# Patient Record
Sex: Male | Born: 1955 | Race: White | Hispanic: No | Smoking: Current every day smoker
Health system: Southern US, Community
[De-identification: ages and names within clinical notes are randomized; demographics above are authoritative.]

## PROBLEM LIST (undated history)

## (undated) DIAGNOSIS — R109 Unspecified abdominal pain: Principal | ICD-10-CM

## (undated) DIAGNOSIS — T8859XA Other complications of anesthesia, initial encounter: Secondary | ICD-10-CM

## (undated) DIAGNOSIS — Z720 Tobacco use: Secondary | ICD-10-CM

## (undated) DIAGNOSIS — T7501XA Shock due to being struck by lightning, initial encounter: Secondary | ICD-10-CM

## (undated) DIAGNOSIS — E669 Obesity, unspecified: Secondary | ICD-10-CM

## (undated) DIAGNOSIS — G43909 Migraine, unspecified, not intractable, without status migrainosus: Secondary | ICD-10-CM

## (undated) DIAGNOSIS — Z8719 Personal history of other diseases of the digestive system: Secondary | ICD-10-CM

## (undated) DIAGNOSIS — F32A Depression, unspecified: Secondary | ICD-10-CM

## (undated) DIAGNOSIS — G8929 Other chronic pain: Secondary | ICD-10-CM

## (undated) DIAGNOSIS — M1990 Unspecified osteoarthritis, unspecified site: Secondary | ICD-10-CM

## (undated) DIAGNOSIS — K3184 Gastroparesis: Secondary | ICD-10-CM

## (undated) DIAGNOSIS — K5790 Diverticulosis of intestine, part unspecified, without perforation or abscess without bleeding: Secondary | ICD-10-CM

## (undated) DIAGNOSIS — K219 Gastro-esophageal reflux disease without esophagitis: Secondary | ICD-10-CM

## (undated) DIAGNOSIS — Z9889 Other specified postprocedural states: Secondary | ICD-10-CM

## (undated) DIAGNOSIS — R011 Cardiac murmur, unspecified: Secondary | ICD-10-CM

## (undated) DIAGNOSIS — Z8619 Personal history of other infectious and parasitic diseases: Secondary | ICD-10-CM

## (undated) DIAGNOSIS — F329 Major depressive disorder, single episode, unspecified: Secondary | ICD-10-CM

## (undated) DIAGNOSIS — G4733 Obstructive sleep apnea (adult) (pediatric): Secondary | ICD-10-CM

## (undated) DIAGNOSIS — K529 Noninfective gastroenteritis and colitis, unspecified: Secondary | ICD-10-CM

## (undated) DIAGNOSIS — E119 Type 2 diabetes mellitus without complications: Secondary | ICD-10-CM

## (undated) DIAGNOSIS — A048 Other specified bacterial intestinal infections: Secondary | ICD-10-CM

## (undated) DIAGNOSIS — M67442 Ganglion, left hand: Secondary | ICD-10-CM

## (undated) DIAGNOSIS — I639 Cerebral infarction, unspecified: Secondary | ICD-10-CM

## (undated) DIAGNOSIS — E785 Hyperlipidemia, unspecified: Secondary | ICD-10-CM

## (undated) DIAGNOSIS — K589 Irritable bowel syndrome without diarrhea: Secondary | ICD-10-CM

## (undated) DIAGNOSIS — T7500XA Unspecified effects of lightning, initial encounter: Secondary | ICD-10-CM

## (undated) DIAGNOSIS — F419 Anxiety disorder, unspecified: Secondary | ICD-10-CM

## (undated) DIAGNOSIS — G40909 Epilepsy, unspecified, not intractable, without status epilepticus: Secondary | ICD-10-CM

## (undated) DIAGNOSIS — I10 Essential (primary) hypertension: Secondary | ICD-10-CM

## (undated) DIAGNOSIS — K760 Fatty (change of) liver, not elsewhere classified: Secondary | ICD-10-CM

## (undated) DIAGNOSIS — M792 Neuralgia and neuritis, unspecified: Secondary | ICD-10-CM

## (undated) DIAGNOSIS — D126 Benign neoplasm of colon, unspecified: Secondary | ICD-10-CM

## (undated) DIAGNOSIS — R55 Syncope and collapse: Secondary | ICD-10-CM

## (undated) DIAGNOSIS — G4730 Sleep apnea, unspecified: Secondary | ICD-10-CM

## (undated) HISTORY — DX: Ganglion, left hand: M67.442

## (undated) HISTORY — PX: LARYNX SURGERY: SHX692

## (undated) HISTORY — DX: Shock due to being struck by lightning, initial encounter: T75.01XA

## (undated) HISTORY — DX: Personal history of other infectious and parasitic diseases: Z86.19

## (undated) HISTORY — DX: Benign neoplasm of colon, unspecified: D12.6

## (undated) HISTORY — DX: Obstructive sleep apnea (adult) (pediatric): G47.33

## (undated) HISTORY — DX: Gastro-esophageal reflux disease without esophagitis: K21.9

## (undated) HISTORY — DX: Tobacco use: Z72.0

## (undated) HISTORY — PX: CATARACT EXTRACTION: SUR2

## (undated) HISTORY — DX: Irritable bowel syndrome, unspecified: K58.9

## (undated) HISTORY — DX: Other specified bacterial intestinal infections: A04.8

## (undated) HISTORY — PX: TONSILLECTOMY: SUR1361

## (undated) HISTORY — DX: Unspecified abdominal pain: R10.9

## (undated) HISTORY — DX: Other chronic pain: G89.29

## (undated) HISTORY — DX: Gastroparesis: K31.84

## (undated) HISTORY — PX: VENA CAVA FILTER PLACEMENT: SHX1085

## (undated) HISTORY — PX: OTHER SURGICAL HISTORY: SHX169

---

## 1998-03-27 ENCOUNTER — Ambulatory Visit (HOSPITAL_COMMUNITY): Admission: RE | Admit: 1998-03-27 | Discharge: 1998-03-27 | Payer: Self-pay | Admitting: Neurology

## 2000-08-31 DIAGNOSIS — T7500XA Unspecified effects of lightning, initial encounter: Secondary | ICD-10-CM

## 2000-08-31 HISTORY — DX: Unspecified effects of lightning, initial encounter: T75.00XA

## 2001-08-31 DIAGNOSIS — A048 Other specified bacterial intestinal infections: Secondary | ICD-10-CM

## 2001-08-31 HISTORY — DX: Other specified bacterial intestinal infections: A04.8

## 2004-08-31 HISTORY — PX: OTHER SURGICAL HISTORY: SHX169

## 2006-02-03 HISTORY — PX: ESOPHAGOGASTRODUODENOSCOPY: SHX1529

## 2010-03-13 ENCOUNTER — Emergency Department (HOSPITAL_COMMUNITY): Admission: EM | Admit: 2010-03-13 | Discharge: 2010-03-13 | Payer: Self-pay | Admitting: Emergency Medicine

## 2010-08-31 HISTORY — PX: COLONOSCOPY: SHX174

## 2010-09-02 HISTORY — PX: ESOPHAGOGASTRODUODENOSCOPY: SHX1529

## 2010-11-16 LAB — CBC
HCT: 39.4 % (ref 39.0–52.0)
Hemoglobin: 13.3 g/dL (ref 13.0–17.0)
MCH: 31.8 pg (ref 26.0–34.0)
MCHC: 33.6 g/dL (ref 30.0–36.0)
MCV: 94.5 fL (ref 78.0–100.0)
Platelets: 227 10*3/uL (ref 150–400)
RBC: 4.17 MIL/uL — ABNORMAL LOW (ref 4.22–5.81)
RDW: 13.4 % (ref 11.5–15.5)
WBC: 10.5 10*3/uL (ref 4.0–10.5)

## 2010-11-16 LAB — DIFFERENTIAL
Basophils Absolute: 0 10*3/uL (ref 0.0–0.1)
Basophils Relative: 0 % (ref 0–1)
Eosinophils Absolute: 0.2 10*3/uL (ref 0.0–0.7)
Eosinophils Relative: 2 % (ref 0–5)
Lymphocytes Relative: 25 % (ref 12–46)
Lymphs Abs: 2.6 10*3/uL (ref 0.7–4.0)
Monocytes Absolute: 0.8 10*3/uL (ref 0.1–1.0)
Monocytes Relative: 8 % (ref 3–12)
Neutro Abs: 6.9 10*3/uL (ref 1.7–7.7)
Neutrophils Relative %: 66 % (ref 43–77)

## 2010-11-16 LAB — COMPREHENSIVE METABOLIC PANEL
ALT: 22 U/L (ref 0–53)
AST: 17 U/L (ref 0–37)
Albumin: 4.1 g/dL (ref 3.5–5.2)
Alkaline Phosphatase: 62 U/L (ref 39–117)
BUN: 18 mg/dL (ref 6–23)
CO2: 25 mEq/L (ref 19–32)
Calcium: 9.2 mg/dL (ref 8.4–10.5)
Chloride: 111 mEq/L (ref 96–112)
Creatinine, Ser: 1.09 mg/dL (ref 0.4–1.5)
GFR calc Af Amer: >60 mL/min (ref >60)
GFR calc non Af Amer: >60 mL/min (ref >60)
Glucose, Bld: 88 mg/dL (ref 70–99)
Potassium: 4.3 mEq/L (ref 3.5–5.1)
Sodium: 142 mEq/L (ref 135–145)
Total Bilirubin: 0.4 mg/dL (ref 0.3–1.2)
Total Protein: 6.8 g/dL (ref 6.0–8.3)

## 2010-11-16 LAB — LIPASE, BLOOD: Lipase: 19 U/L (ref 11–59)

## 2011-08-11 DIAGNOSIS — G8929 Other chronic pain: Secondary | ICD-10-CM

## 2011-08-11 HISTORY — DX: Other chronic pain: G89.29

## 2011-10-28 ENCOUNTER — Encounter: Payer: Self-pay | Admitting: Urgent Care

## 2011-10-28 ENCOUNTER — Ambulatory Visit (INDEPENDENT_AMBULATORY_CARE_PROVIDER_SITE_OTHER): Payer: Medicaid Other | Admitting: Urgent Care

## 2011-10-28 VITALS — BP 142/82 | HR 97 | Temp 98.6°F | Ht 72.0 in | Wt 223.8 lb

## 2011-10-28 DIAGNOSIS — K760 Fatty (change of) liver, not elsewhere classified: Secondary | ICD-10-CM

## 2011-10-28 DIAGNOSIS — K824 Cholesterolosis of gallbladder: Secondary | ICD-10-CM

## 2011-10-28 DIAGNOSIS — R109 Unspecified abdominal pain: Secondary | ICD-10-CM | POA: Insufficient documentation

## 2011-10-28 DIAGNOSIS — K7689 Other specified diseases of liver: Secondary | ICD-10-CM

## 2011-10-28 DIAGNOSIS — G8929 Other chronic pain: Secondary | ICD-10-CM

## 2011-10-28 HISTORY — DX: Cholesterolosis of gallbladder: K82.4

## 2011-10-28 LAB — HEPATIC FUNCTION PANEL
ALT: 34 U/L (ref 0–53)
AST: 22 U/L (ref 0–37)
Albumin: 4.2 g/dL (ref 3.5–5.2)
Alkaline Phosphatase: 60 U/L (ref 39–117)
Bilirubin, Direct: 0.1 mg/dL (ref 0.0–0.3)
Indirect Bilirubin: 0.1 mg/dL (ref 0.0–0.9)
Total Bilirubin: 0.2 mg/dL — ABNORMAL LOW (ref 0.3–1.2)
Total Protein: 6.7 g/dL (ref 6.0–8.3)

## 2011-10-28 LAB — CBC WITH DIFFERENTIAL/PLATELET
Basophils Absolute: 0 10*3/uL (ref 0.0–0.1)
Basophils Relative: 0 % (ref 0–1)
Eosinophils Absolute: 0.3 10*3/uL (ref 0.0–0.7)
Eosinophils Relative: 4 % (ref 0–5)
HCT: 41.7 % (ref 39.0–52.0)
Hemoglobin: 13.9 g/dL (ref 13.0–17.0)
Lymphocytes Relative: 23 % (ref 12–46)
Lymphs Abs: 1.9 10*3/uL (ref 0.7–4.0)
MCH: 32.1 pg (ref 26.0–34.0)
MCHC: 33.3 g/dL (ref 30.0–36.0)
MCV: 96.3 fL (ref 78.0–100.0)
Monocytes Absolute: 0.9 10*3/uL (ref 0.1–1.0)
Monocytes Relative: 11 % (ref 3–12)
Neutro Abs: 5.2 10*3/uL (ref 1.7–7.7)
Neutrophils Relative %: 62 % (ref 43–77)
Platelets: 201 10*3/uL (ref 150–400)
RBC: 4.33 MIL/uL (ref 4.22–5.81)
RDW: 13.8 % (ref 11.5–15.5)
WBC: 8.4 10*3/uL (ref 4.0–10.5)

## 2011-10-28 LAB — LIPASE: Lipase: 25 U/L (ref 0–75)

## 2011-10-28 LAB — AMYLASE: Amylase: 49 U/L (ref 0–105)

## 2011-10-28 NOTE — Progress Notes (Signed)
Faxed to PCP

## 2011-10-28 NOTE — Assessment & Plan Note (Addendum)
Brian [name] has chronic abdominal pain, irritable bowel syndrome and GERD diagnosed years ago in [place]. He was previously followed by GI there. I have requested last colonoscopy & EGD reports with pathology.  He also has 2 small left renal lesions which are indeterminate.  He will need followup with his PCP for further recommendations regarding these lesions. CBC, LFTs, amylase, lipase

## 2011-10-28 NOTE — Patient Instructions (Signed)
Go get your lab work this week.  We will call you with results. We request your colonoscopy and EGD reports with any biopsies from [place]. I will call you once lab work is reviewed to talk about referral to a surgeon to have her gallbladder taken out. 1-800-quit-now for help with quitting smoking

## 2011-10-28 NOTE — Progress Notes (Signed)
Referring Provider: Denita Lung, DO Primary Care Physician:  Denita Lung, DO, DO Primary Gastroenterologist:  Dr. Jena Gauss  Chief Complaint  Patient presents with  . Abdominal Pain    fatty liver  . Nausea    HPI:  Brian [name] is a 56 y.o. male here as a referral from Dr. Fran Lowes for her fatty liver and multiple gallbladder polyps. Mr. Bais recently moved from [place] November 2012. She gives extensive GI history of chronic abdominal pain, irritable bowel syndrome, and GERD.  He has had constant LLQ pain since 2006. He was followed by Dr. Steele Berg right in [place], [place]. He has had a colonoscopy and EGD within the last year or so. He gives history of adenomatous polyps and says he is due for repeat colonoscopy in 3 years. He is on Elavil and multiple chronic narcotics as well as Phenergan PPI for his chronic abdominal pain/nausea. He was seen by his primary care physician for nausea and abdominal pain. An abdominal ultrasound was ordered which showed hepatomegaly with liver measuring 19.1 cm and fatty infiltration of the liver. There were also multiple gallbladder wall polyps up to 11 mm. No stones noted or biliary dilation.  He did have 2 small left renal indeterminate lesions.     He denies any history of liver disease. His main complaint is that of left-sided abdominal pain which she rates 10/10,  lasts hours, doubles him over, describes as constant/knifelike.  Denies vomiting, but nausea with pain.  Wt stable.  Pain worse w/ eating.  Worse with movement.  He also describes diarrhea 75% of the time with at least 3 loose stools per day. Denies any rectal bleeding, melena, clay colored stools or mucus. Denies fever.  He is acid reflux is well controlled at this time. He tells me his previous EGD was normal.  Past Medical History  Diagnosis Date  . DM (diabetes mellitus)   . HTN (hypertension)   . IBS (irritable bowel syndrome)   . GERD (gastroesophageal reflux disease)   . High cholesterol     . Chronic abdominal pain 08/11/11    Reported per patient, Dr. Ralene Cork in [place]  . Adenomatous colon polyp     Past Surgical History  Procedure Date  . Tonsillectomy   . Arm surgery     tendon/left    Current Outpatient Prescriptions  Medication Sig Dispense Refill  . ACETAMINOPHEN-BUTALBITAL (BUPAP) 50-650 MG TABS Take by mouth every 6 (six) hours as needed.      Marland Kitchen amitriptyline (ELAVIL) 100 MG tablet Take 100 mg by mouth at bedtime.      Marland Kitchen amLODipine-valsartan (EXFORGE) 5-160 MG per tablet Take 1 tablet by mouth 2 (two) times daily.      Marland Kitchen BIOTIN PO Take 10,000 mg by mouth daily.      . clidinium-chlordiazePOXIDE (LIBRAX) 2.5-5 MG per capsule Take 1 capsule by mouth 3 (three) times daily as needed.      . DULoxetine (CYMBALTA) 60 MG capsule Take 60 mg by mouth daily.      Marland Kitchen glimepiride (AMARYL) 4 MG tablet Take 4 mg by mouth daily before breakfast.      . HYDROcodone-acetaminophen (LORTAB) 10-500 MG per tablet Take 1 tablet by mouth every 6 (six) hours as needed.      . metFORMIN (GLUCOPHAGE) 500 MG tablet Take 500 mg by mouth daily with breakfast.      . metoCLOPramide (REGLAN) 10 MG tablet Take 10 mg by mouth 4 (four) times daily.      Marland Kitchen  MILK THISTLE PO Take 1,000 mg by mouth daily.      . Multiple Vitamin (MULTIVITAMIN) capsule Take 1 capsule by mouth daily.      . Niacin-Simvastatin [facility]) 500-40 MG TB24 Take by mouth daily.      . pantoprazole (PROTONIX) 40 MG tablet Take 40 mg by mouth daily.      . promethazine (PHENERGAN) 25 MG tablet Take 25 mg by mouth every 6 (six) hours as needed.      . sucralfate (CARAFATE) 1 G tablet Take 1 g by mouth 4 (four) times daily.      Marland Kitchen topiramate (TOPAMAX) 100 MG tablet Take 100 mg by mouth 2 (two) times daily.        Allergies as of 10/28/2011 - Review Complete 10/28/2011  Allergen Reaction Noted  . Ketorolac tromethamine  10/27/2011  . Ciprofloxacin Rash 10/27/2011  . Metronidazole Rash 10/27/2011  . Sulfamethoxazole Rash  10/27/2011  . Tetracyclines & related Rash 10/27/2011    Family History  Problem Relation Age of Onset  . Cancer Father 9    Gallbladder    History   Social History  . Marital Status: Married    Spouse Name: N/A    Number of Children: 1  . Years of Education: N/A   Occupational History  . disabled    Social History Main Topics  . Smoking status: Current Everyday Smoker -- 1.0 packs/day for 42 years    Types: Cigarettes  . Smokeless tobacco: Not on file  . Alcohol Use: No     last etoh 2003, daily up until then  . Drug Use: No  . Sexually Active: Not on file   Other Topics Concern  . Not on file   Social History Narrative   Divorced, moved to [facility] November 2012  Review of Systems: Gen: Denies any fever, chills, sweats, anorexia, fatigue, weakness, malaise, weight loss, and sleep disorder CV: Denies chest pain, angina, palpitations, syncope, orthopnea, PND, peripheral edema, and claudication. Resp: Denies dyspnea at rest, dyspnea with exercise, cough, sputum, wheezing, coughing up blood, and pleurisy. GI: Denies vomiting blood, jaundice, and fecal incontinence. GU : Denies urinary burning, blood in urine, urinary frequency, urinary hesitancy, nocturnal urination, and urinary incontinence. MS: Denies joint pain, limitation of movement, and swelling, stiffness, low back pain, extremity pain. Denies muscle weakness, cramps, atrophy.  Derm: Denies rash, itching, dry skin, hives, moles, warts, or unhealing ulcers.  Psych: Denies depression, anxiety, memory loss, suicidal ideation, hallucinations, paranoia, and confusion. Heme: Denies bruising, bleeding, and enlarged lymph nodes. Neuro:  Denies any headaches, dizziness, paresthesias. Endo:  Denies any problems with DM, thyroid, adrenal function.  Physical Exam: BP 142/82  Pulse 97  Temp(Src) 98.6 F (37 C) (Temporal)  Ht 6' (1.829 m)  Wt 223 lb 12.8 oz (101.515 kg)  BMI 30.35 kg/m2 General:   Alert,   Well-developed, well-nourished, pleasant and cooperative in NAD. Head:  Normocephalic and atraumatic. Eyes:  Sclera clear, no icterus.   Conjunctiva pink. Ears:  Normal auditory acuity. Nose:  No deformity, discharge, or lesions. Mouth:  No deformity or lesions,oropharynx pink & moist. Neck:  Supple; no masses or thyromegaly. Lungs:  Clear throughout to auscultation.   No wheezes, crackles, or rhonchi. No acute distress. Heart:  Regular rate and rhythm; no murmurs, clicks, rubs,  or gallops. Abdomen:  Normal bowel sounds.  No bruits.  Soft and non-distended without masses, moderate LLQ tenderness on deep palpation. No hepatosplenomegaly or hernias noted.  No guarding or rebound  tenderness.   Rectal:  Deferred. Msk:  Symmetrical without gross deformities. Normal posture. Pulses:  Normal pulses noted. Extremities:  No edema. Neurologic:  Alert and oriented x4;  grossly normal neurologically. Skin:  Intact without significant lesions or rashes. Lymph Nodes:  No significant cervical adenopathy. Psych:  Alert and cooperative. Normal mood and affect.

## 2011-10-28 NOTE — Assessment & Plan Note (Signed)
See "fatty liver"

## 2011-10-28 NOTE — Assessment & Plan Note (Signed)
Brian [name] is a pleasant 56 y.o. male here for further evaluation of fatty liver and gallbladder polyps found on recent ultrasound.  Multiple gallbladder polyps on ultrasound, especially larger than 1 cm with a family history of gallbladder cancer, is going to require cholecystectomy.  We will need to check baseline LFTs given fatty liver seen on ultrasound. No stigmata of chronic liver disease or history of liver disease.  Check LFTs

## 2011-10-29 ENCOUNTER — Telehealth: Payer: Self-pay | Admitting: Gastroenterology

## 2011-10-29 NOTE — Progress Notes (Signed)
Referral sent to [name]

## 2011-10-29 NOTE — Telephone Encounter (Signed)
Pt is scheduled to see Dr Leticia Penna on 03/07 @ 1:45- He is aware of the appt and also to bring $100 at that time

## 2011-10-29 NOTE — Progress Notes (Addendum)
Discussed recommendations below with patient. He agrees with plan. Please send referral to Dr. Lovell Sheehan or Leticia Penna reason: Cholecystectomy due to multiple gallbladder polyps and a family history of gallbladder cancer, liver biopsy at the same time due to echogenic enlarged fatty liver on ultrasound Please schedule appointment after her liver biopsy with me or Dr. Jena Gauss on a day that Dr. Jena Gauss is in the office Thanks

## 2011-10-29 NOTE — Progress Notes (Addendum)
Discussed gallbladder polyps and fatty liver with Dr. Jena Gauss. He recommends surgical referral for cholecystectomy and liver biopsy at the same time given fatty liver/enlarged echogenic on ultrasound. Left message on machine to discuss surgical referral patient. When he returns call, I would like to speak with him. Thank you

## 2011-10-29 NOTE — Progress Notes (Signed)
Quick Note:  See office note from 10/28/10 ______

## 2011-11-05 ENCOUNTER — Encounter: Payer: Self-pay | Admitting: Urgent Care

## 2011-11-05 NOTE — Progress Notes (Addendum)
EGD reviewed from [facility] Dr. 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 They did show mild gastritis with patchy intestinal metaplasia and were negative for H. pylori. CT scan of abdomen and pelvis was ordered at that time, however I do not have results.    LeighAnn-please request CT scan from January 2012 at [facility] [place], [place]. Thanks

## 2011-11-05 NOTE — Progress Notes (Signed)
Requested.

## 2011-11-10 ENCOUNTER — Encounter: Payer: Self-pay | Admitting: Internal Medicine

## 2011-11-13 NOTE — Patient Instructions (Addendum)
20 Brian [name]  11/13/2011   Your procedure is scheduled on:   11/20/2011   [facility] to [facility] at  [place]  AM.  Call this number if you have problems the morning of surgery: [phone]   Remember:   Do not eat food:After Midnight.  May have clear liquids:until Midnight .  Clear liquids include soda, tea, black coffee, apple or grape juice, broth.  Take these medicines the morning of surgery with A SIP OF WATER:  Protonix,exforge,cymbalta,lortab,librax,skelaxin,reglan,phenergan,topamax   Do not wear jewelry, make-up or nail polish.  Do not wear lotions, powders, or perfumes. You may wear deodorant.  Do not shave 48 hours prior to surgery.  Do not bring valuables to the hospital.  Contacts, dentures or bridgework may not be worn into surgery.  Leave suitcase in the car. After surgery it may be brought to your room.  For patients admitted to the hospital, checkout time is 11:00 AM the day of discharge.   Patients discharged the day of surgery will not be allowed to drive home.  Name and phone number of your driver: family  Special Instructions: CHG Shower Use Special Wash: 1/2 bottle night before surgery and 1/2 bottle morning of surgery.   Please read over the following fact sheets that you were given: Pain Booklet, MRSA Information, Surgical Site Infection Prevention, Anesthesia Post-op Instructions and Care and Recovery After Surgery Laparoscopic Cholecystectomy Laparoscopic cholecystectomy is surgery to remove the gallbladder. The gallbladder is located slightly to the right of center in the abdomen, behind the liver. It is a concentrating and storage sac for the bile produced in the liver. Bile aids in the digestion and absorption of fats. Gallbladder disease (cholecystitis) is an inflammation of your gallbladder. This condition is usually caused by a buildup of gallstones (cholelithiasis) in your gallbladder. Gallstones can block the flow of bile, resulting in inflammation and pain. In  severe cases, emergency surgery may be required. When emergency surgery is not required, you will have time to prepare for the procedure. Laparoscopic surgery is an alternative to open surgery. Laparoscopic surgery usually has a shorter recovery time. Your common bile duct may also need to be examined and explored. Your caregiver will discuss this with you if he or she feels this should be done. If stones are found in the common bile duct, they may be removed. LET YOUR CAREGIVER KNOW ABOUT:  Allergies to food or medicine.   Medicines taken, including vitamins, herbs, eyedrops, over-the-counter medicines, and creams.   Use of steroids (by mouth or creams).   Previous problems with anesthetics or numbing medicines.   History of bleeding problems or blood clots.   Previous surgery.   Other health problems, including diabetes and kidney problems.   Possibility of pregnancy, if this applies.  RISKS AND COMPLICATIONS All surgery is associated with risks. Some problems that may occur following this procedure include:  Infection.   Damage to the common bile duct, nerves, arteries, veins, or other internal organs such as the stomach or intestines.   Bleeding.   A stone may remain in the common bile duct.  BEFORE THE PROCEDURE  Do not take aspirin for 3 days prior to surgery or blood thinners for 1 week prior to surgery.   Do not eat or drink anything after midnight the night before surgery.   Let your caregiver know if you develop a cold or other infectious problem prior to surgery.   You should be present 60 minutes before the procedure or as  directed.  PROCEDURE  You will be given medicine that makes you sleep (general anesthetic). When you are asleep, your surgeon will make several small cuts (incisions) in your abdomen. One of these incisions is used to insert a small, lighted scope (laparoscope) into the abdomen. The laparoscope helps the surgeon see into your abdomen. Carbon  dioxide gas will be pumped into your abdomen. The gas allows more room for the surgeon to perform your surgery. Other operating instruments are inserted through the other incisions. Laparoscopic procedures may not be appropriate when:  There is major scarring from previous surgery.   The gallbladder is extremely inflamed.   There are bleeding disorders or unexpected cirrhosis of the liver.   A pregnancy is near term.   Other conditions make the laparoscopic procedure impossible.  If your surgeon feels it is not safe to continue with a laparoscopic procedure, he or she will perform an open abdominal procedure. In this case, the surgeon will make an incision to open the abdomen. This gives the surgeon a larger view and field to work within. This may allow the surgeon to perform procedures that sometimes cannot be performed with a laparoscope alone. Open surgery has a longer recovery time. AFTER THE PROCEDURE  You will be taken to the recovery area where a nurse will watch and check your progress.   You may be allowed to go home the same day.   Do not resume physical activities until directed by your caregiver.   You may resume a normal diet and activities as directed.  Document Released: 08/17/2005 Document Revised: 08/06/2011 Document Reviewed: 01/30/2011 [facility] Patient Information 2012 [place], [place].PATIENT INSTRUCTIONS POST-ANESTHESIA  IMMEDIATELY FOLLOWING SURGERY:  Do not drive or operate machinery for the first twenty four hours after surgery.  Do not make any important decisions for twenty four hours after surgery or while taking narcotic pain medications or sedatives.  If you develop intractable nausea and vomiting or a severe headache please notify your doctor immediately.  FOLLOW-UP:  Please make an appointment with your surgeon as instructed. You do not need to follow up with anesthesia unless specifically instructed to do so.  WOUND CARE INSTRUCTIONS (if applicable):  Keep  a dry clean dressing on the anesthesia/puncture wound site if there is drainage.  Once the wound has quit draining you may leave it open to air.  Generally you should leave the bandage intact for twenty four hours unless there is drainage.  If the epidural site drains for more than 36-48 hours please call the anesthesia department.  QUESTIONS?:  Please feel free to call your physician or the hospital operator if you have any questions, and they will be happy to assist you.     [facility] Anesthesia Department [address] [place] [phone]

## 2011-11-16 ENCOUNTER — Other Ambulatory Visit: Payer: Self-pay

## 2011-11-16 ENCOUNTER — Encounter (HOSPITAL_COMMUNITY)
Admission: RE | Admit: 2011-11-16 | Discharge: 2011-11-16 | Disposition: A | Discharge status: Home / Self Care | Payer: Medicaid Other | Source: Ambulatory Visit | Attending: General Surgery | Admitting: General Surgery

## 2011-11-16 ENCOUNTER — Encounter (HOSPITAL_COMMUNITY): Payer: Self-pay

## 2011-11-16 ENCOUNTER — Encounter (HOSPITAL_COMMUNITY): Payer: Self-pay | Admitting: Pharmacy Technician

## 2011-11-16 LAB — CBC
HCT: 40.4 % (ref 39.0–52.0)
Hemoglobin: 13.7 g/dL (ref 13.0–17.0)
MCH: 31.9 pg (ref 26.0–34.0)
MCHC: 33.9 g/dL (ref 30.0–36.0)
MCV: 94.2 fL (ref 78.0–100.0)
Platelets: 264 10*3/uL (ref 150–400)
RBC: 4.29 MIL/uL (ref 4.22–5.81)
RDW: 13.3 % (ref 11.5–15.5)
WBC: 8.1 10*3/uL (ref 4.0–10.5)

## 2011-11-16 LAB — BASIC METABOLIC PANEL
BUN: 21 mg/dL (ref 6–23)
CO2: 25 mEq/L (ref 19–32)
Calcium: 9.4 mg/dL (ref 8.4–10.5)
Chloride: 107 mEq/L (ref 96–112)
Creatinine, Ser: 1.13 mg/dL (ref 0.50–1.35)
GFR calc Af Amer: 83 mL/min — ABNORMAL LOW (ref >90)
GFR calc non Af Amer: 71 mL/min — ABNORMAL LOW (ref >90)
Glucose, Bld: 130 mg/dL — ABNORMAL HIGH (ref 70–99)
Potassium: 4.1 mEq/L (ref 3.5–5.1)
Sodium: 142 mEq/L (ref 135–145)

## 2011-11-16 LAB — DIFFERENTIAL
Basophils Absolute: 0.1 10*3/uL (ref 0.0–0.1)
Basophils Relative: 1 % (ref 0–1)
Eosinophils Absolute: 0.3 10*3/uL (ref 0.0–0.7)
Eosinophils Relative: 4 % (ref 0–5)
Lymphocytes Relative: 35 % (ref 12–46)
Lymphs Abs: 2.8 10*3/uL (ref 0.7–4.0)
Monocytes Absolute: 0.6 10*3/uL (ref 0.1–1.0)
Monocytes Relative: 8 % (ref 3–12)
Neutro Abs: 4.3 10*3/uL (ref 1.7–7.7)
Neutrophils Relative %: 53 % (ref 43–77)

## 2011-11-16 LAB — SURGICAL PCR SCREEN
MRSA, PCR: NEGATIVE
Staphylococcus aureus: NEGATIVE

## 2011-11-20 ENCOUNTER — Encounter (HOSPITAL_COMMUNITY): Payer: Self-pay | Admitting: Anesthesiology

## 2011-11-20 ENCOUNTER — Encounter (HOSPITAL_COMMUNITY): Payer: Self-pay | Admitting: *Deleted

## 2011-11-20 ENCOUNTER — Ambulatory Visit (HOSPITAL_COMMUNITY): Payer: Medicaid Other | Admitting: Anesthesiology

## 2011-11-20 ENCOUNTER — Ambulatory Visit (HOSPITAL_COMMUNITY)
Admission: RE | Admit: 2011-11-20 | Discharge: 2011-11-20 | Disposition: A | Discharge status: Home / Self Care | Payer: Medicaid Other | Source: Ambulatory Visit | Attending: General Surgery | Admitting: General Surgery

## 2011-11-20 ENCOUNTER — Encounter (HOSPITAL_COMMUNITY)
Admission: RE | Disposition: A | Discharge status: Home / Self Care | Payer: Self-pay | Source: Ambulatory Visit | Attending: General Surgery

## 2011-11-20 DIAGNOSIS — Z79899 Other long term (current) drug therapy: Secondary | ICD-10-CM | POA: Insufficient documentation

## 2011-11-20 DIAGNOSIS — E119 Type 2 diabetes mellitus without complications: Secondary | ICD-10-CM | POA: Insufficient documentation

## 2011-11-20 DIAGNOSIS — K810 Acute cholecystitis: Secondary | ICD-10-CM | POA: Insufficient documentation

## 2011-11-20 DIAGNOSIS — K7689 Other specified diseases of liver: Secondary | ICD-10-CM | POA: Insufficient documentation

## 2011-11-20 DIAGNOSIS — Z01810 Encounter for preprocedural cardiovascular examination: Secondary | ICD-10-CM | POA: Insufficient documentation

## 2011-11-20 DIAGNOSIS — I10 Essential (primary) hypertension: Secondary | ICD-10-CM | POA: Insufficient documentation

## 2011-11-20 DIAGNOSIS — K760 Fatty (change of) liver, not elsewhere classified: Secondary | ICD-10-CM

## 2011-11-20 DIAGNOSIS — Z01812 Encounter for preprocedural laboratory examination: Secondary | ICD-10-CM | POA: Insufficient documentation

## 2011-11-20 DIAGNOSIS — K824 Cholesterolosis of gallbladder: Secondary | ICD-10-CM

## 2011-11-20 DIAGNOSIS — E7800 Pure hypercholesterolemia, unspecified: Secondary | ICD-10-CM | POA: Insufficient documentation

## 2011-11-20 HISTORY — PX: CHOLECYSTECTOMY: SHX55

## 2011-11-20 HISTORY — PX: LIVER BIOPSY: SHX301

## 2011-11-20 LAB — GLUCOSE, CAPILLARY
Glucose-Capillary: 118 mg/dL — ABNORMAL HIGH (ref 70–99)
Glucose-Capillary: 160 mg/dL — ABNORMAL HIGH (ref 70–99)

## 2011-11-20 SURGERY — LAPAROSCOPIC CHOLECYSTECTOMY
Anesthesia: General | Wound class: Clean

## 2011-11-20 MED ORDER — ONDANSETRON HCL 4 MG/2ML IJ SOLN
4.0000 mg | Freq: Once | INTRAMUSCULAR | Status: AC | PRN
  Administered 2011-11-20: 4 mg via INTRAVENOUS

## 2011-11-20 MED ORDER — FENTANYL CITRATE 0.05 MG/ML IJ SOLN
INTRAMUSCULAR | Status: AC
  Filled 2011-11-20: qty 2

## 2011-11-20 MED ORDER — GLYCOPYRROLATE 0.2 MG/ML IJ SOLN
INTRAMUSCULAR | Status: AC
  Administered 2011-11-20: 0.2 mg via INTRAVENOUS
  Filled 2011-11-20: qty 1

## 2011-11-20 MED ORDER — BUPIVACAINE HCL (PF) 0.5 % IJ SOLN
INTRAMUSCULAR | Status: DC | PRN
  Administered 2011-11-20: 10 mL

## 2011-11-20 MED ORDER — FENTANYL CITRATE 0.05 MG/ML IJ SOLN
INTRAMUSCULAR | Status: DC | PRN
  Administered 2011-11-20 (×7): 50 ug via INTRAVENOUS

## 2011-11-20 MED ORDER — PROPOFOL 10 MG/ML IV EMUL
INTRAVENOUS | Status: AC
  Filled 2011-11-20: qty 20

## 2011-11-20 MED ORDER — FENTANYL CITRATE 0.05 MG/ML IJ SOLN
25.0000 ug | INTRAMUSCULAR | Status: DC | PRN
  Administered 2011-11-20 (×3): 50 ug via INTRAVENOUS

## 2011-11-20 MED ORDER — ACETAMINOPHEN 325 MG PO TABS: 325.0000 mg | ORAL_TABLET | ORAL | Status: DC | PRN

## 2011-11-20 MED ORDER — CEFAZOLIN SODIUM 1-5 GM-% IV SOLN
INTRAVENOUS | Status: DC | PRN
  Administered 2011-11-20: 1 g via INTRAVENOUS

## 2011-11-20 MED ORDER — MIDAZOLAM HCL 2 MG/2ML IJ SOLN
INTRAMUSCULAR | Status: AC
  Administered 2011-11-20: 2 mg via INTRAVENOUS
  Filled 2011-11-20: qty 2

## 2011-11-20 MED ORDER — BUPIVACAINE HCL (PF) 0.5 % IJ SOLN
INTRAMUSCULAR | Status: AC
  Filled 2011-11-20: qty 30

## 2011-11-20 MED ORDER — LIDOCAINE HCL 1 % IJ SOLN
INTRAMUSCULAR | Status: DC | PRN
  Administered 2011-11-20: 50 mg via INTRADERMAL

## 2011-11-20 MED ORDER — HYDROCODONE-ACETAMINOPHEN 5-325 MG PO TABS: 1.0000 | ORAL_TABLET | ORAL | Status: AC | PRN

## 2011-11-20 MED ORDER — GLYCOPYRROLATE 0.2 MG/ML IJ SOLN
0.2000 mg | Freq: Once | INTRAMUSCULAR | Status: AC
  Administered 2011-11-20: 0.2 mg via INTRAVENOUS

## 2011-11-20 MED ORDER — CEFAZOLIN SODIUM 1-5 GM-% IV SOLN
INTRAVENOUS | Status: AC
  Filled 2011-11-20: qty 50

## 2011-11-20 MED ORDER — LIDOCAINE HCL (PF) 1 % IJ SOLN
INTRAMUSCULAR | Status: AC
  Filled 2011-11-20: qty 5

## 2011-11-20 MED ORDER — GLYCOPYRROLATE 0.2 MG/ML IJ SOLN
INTRAMUSCULAR | Status: AC
  Filled 2011-11-20: qty 2

## 2011-11-20 MED ORDER — FENTANYL CITRATE 0.05 MG/ML IJ SOLN
INTRAMUSCULAR | Status: AC
  Administered 2011-11-20: 50 ug via INTRAVENOUS
  Filled 2011-11-20: qty 2

## 2011-11-20 MED ORDER — ONDANSETRON HCL 4 MG/2ML IJ SOLN
4.0000 mg | Freq: Once | INTRAMUSCULAR | Status: AC
  Administered 2011-11-20: 4 mg via INTRAVENOUS

## 2011-11-20 MED ORDER — ENOXAPARIN SODIUM 40 MG/0.4ML ~~LOC~~ SOLN
SUBCUTANEOUS | Status: AC
  Administered 2011-11-20: 40 mg via SUBCUTANEOUS
  Filled 2011-11-20: qty 0.4

## 2011-11-20 MED ORDER — LACTATED RINGERS IV SOLN: INTRAVENOUS | Status: DC

## 2011-11-20 MED ORDER — NEOSTIGMINE METHYLSULFATE 1 MG/ML IJ SOLN
INTRAMUSCULAR | Status: DC | PRN
  Administered 2011-11-20: 4.8 mg via INTRAVENOUS

## 2011-11-20 MED ORDER — ROCURONIUM BROMIDE 100 MG/10ML IV SOLN
INTRAVENOUS | Status: DC | PRN
  Administered 2011-11-20: 35 mg via INTRAVENOUS
  Administered 2011-11-20: 10 mg via INTRAVENOUS

## 2011-11-20 MED ORDER — PROPOFOL 10 MG/ML IV BOLUS
INTRAVENOUS | Status: DC | PRN
  Administered 2011-11-20: 180 mg via INTRAVENOUS

## 2011-11-20 MED ORDER — FENTANYL CITRATE 0.05 MG/ML IJ SOLN
INTRAMUSCULAR | Status: AC
  Administered 2011-11-20: 50 ug via INTRAVENOUS
  Filled 2011-11-20: qty 5

## 2011-11-20 MED ORDER — ONDANSETRON HCL 4 MG/2ML IJ SOLN
INTRAMUSCULAR | Status: AC
  Administered 2011-11-20: 4 mg via INTRAVENOUS
  Filled 2011-11-20: qty 2

## 2011-11-20 MED ORDER — LACTATED RINGERS IV SOLN
INTRAVENOUS | Status: DC
  Administered 2011-11-20: 1000 mL via INTRAVENOUS

## 2011-11-20 MED ORDER — MIDAZOLAM HCL 2 MG/2ML IJ SOLN
1.0000 mg | INTRAMUSCULAR | Status: DC | PRN
  Administered 2011-11-20: 2 mg via INTRAVENOUS

## 2011-11-20 MED ORDER — ROCURONIUM BROMIDE 50 MG/5ML IV SOLN
INTRAVENOUS | Status: AC
  Filled 2011-11-20: qty 1

## 2011-11-20 MED ORDER — NEOSTIGMINE METHYLSULFATE 1 MG/ML IJ SOLN
INTRAMUSCULAR | Status: AC
  Filled 2011-11-20: qty 10

## 2011-11-20 MED ORDER — ENOXAPARIN SODIUM 40 MG/0.4ML ~~LOC~~ SOLN
40.0000 mg | Freq: Once | SUBCUTANEOUS | Status: AC
  Administered 2011-11-20: 40 mg via SUBCUTANEOUS

## 2011-11-20 MED ORDER — CEFAZOLIN SODIUM 1-5 GM-% IV SOLN: 1.0000 g | INTRAVENOUS | Status: DC

## 2011-11-20 SURGICAL SUPPLY — 42 items
APPLIER CLIP UNV 5X34 EPIX (ENDOMECHANICALS) ×2 IMPLANT
BAG HAMPER (MISCELLANEOUS) ×2 IMPLANT
BENZOIN TINCTURE PRP APPL 2/3 (GAUZE/BANDAGES/DRESSINGS) ×2 IMPLANT
CLOTH BEACON ORANGE TIMEOUT ST (SAFETY) ×2 IMPLANT
COVER SURGICAL LIGHT HANDLE (MISCELLANEOUS) ×4 IMPLANT
DECANTER SPIKE VIAL GLASS SM (MISCELLANEOUS) ×2 IMPLANT
DEVICE TROCAR PUNCTURE CLOSURE (ENDOMECHANICALS) ×2 IMPLANT
DURAPREP 26ML APPLICATOR (WOUND CARE) ×2 IMPLANT
ELECT REM PT RETURN 9FT ADLT (ELECTROSURGICAL) ×2
ELECTRODE REM PT RTRN 9FT ADLT (ELECTROSURGICAL) ×1 IMPLANT
FILTER SMOKE EVAC LAPAROSHD (FILTER) ×2 IMPLANT
FORMALIN 10 PREFIL 120ML (MISCELLANEOUS) ×2 IMPLANT
GLOVE BIOGEL PI IND STRL 7.5 (GLOVE) ×1 IMPLANT
GLOVE BIOGEL PI INDICATOR 7.5 (GLOVE) ×1
GLOVE ECLIPSE 7.0 STRL STRAW (GLOVE) ×2 IMPLANT
GOWN STRL REIN XL XLG (GOWN DISPOSABLE) ×6 IMPLANT
HEMOSTAT SNOW SURGICEL 2X4 (HEMOSTASIS) ×2 IMPLANT
INST SET LAPROSCOPIC AP (KITS) ×2 IMPLANT
IV NS IRRIG 3000ML ARTHROMATIC (IV SOLUTION) ×2 IMPLANT
KIT ROOM TURNOVER APOR (KITS) ×2 IMPLANT
KIT TROCAR LAP CHOLE (TROCAR) ×2 IMPLANT
MANIFOLD NEPTUNE II (INSTRUMENTS) ×2 IMPLANT
NEEDLE BIOPSY 14GX4.5 SOFT TIS (NEEDLE) ×2 IMPLANT
NEEDLE BIOPSY 14X6 SOFT TISS (NEEDLE) IMPLANT
NEEDLE HYPO 18GX1.5 BLUNT FILL (NEEDLE) IMPLANT
NEEDLE HYPO 25X1 1.5 SAFETY (NEEDLE) ×2 IMPLANT
NS IRRIG 1000ML POUR BTL (IV SOLUTION) ×2 IMPLANT
PACK LAP CHOLE LZT030E (CUSTOM PROCEDURE TRAY) ×2 IMPLANT
PACK MINOR (CUSTOM PROCEDURE TRAY) ×2 IMPLANT
PAD ARMBOARD 7.5X6 YLW CONV (MISCELLANEOUS) ×2 IMPLANT
POUCH SPECIMEN RETRIEVAL 10MM (ENDOMECHANICALS) ×2 IMPLANT
SET BASIN LINEN APH (SET/KITS/TRAYS/PACK) ×2 IMPLANT
SET TUBE IRRIG SUCTION NO TIP (IRRIGATION / IRRIGATOR) IMPLANT
STRIP CLOSURE SKIN 1/2X4 (GAUZE/BANDAGES/DRESSINGS) ×2 IMPLANT
SUT MNCRL AB 4-0 PS2 18 (SUTURE) ×4 IMPLANT
SUT VIC AB 2-0 CT2 27 (SUTURE) ×4 IMPLANT
SUT VIC AB 4-0 PS2 27 (SUTURE) ×2 IMPLANT
SWABSTICK BENZOIN STERILE (MISCELLANEOUS) ×2 IMPLANT
SYR BULB IRRIGATION 50ML (SYRINGE) ×2 IMPLANT
SYR CONTROL 10ML LL (SYRINGE) ×2 IMPLANT
TOWEL OR 17X26 4PK STRL BLUE (TOWEL DISPOSABLE) ×2 IMPLANT
WARMER LAPAROSCOPE (MISCELLANEOUS) ×2 IMPLANT

## 2011-11-20 NOTE — H&P (Signed)
  NTS SOAP Note  Vital Signs:  Vitals as of: 11/05/2011: Systolic 123: Diastolic 78: Heart Rate 92: Temp 98.17F: Height 56ft 0in: Weight 220Lbs 0 Ounces: OFC 0in: Respiratory Rate 0: O2 Saturation 0: Pain Level 6: BMI 30  BMI : 29.84 kg/m2  Subjective: This 56 Years 4 Months old Male presents for of told he has polyps in his gallbladder.  Patient states that he has occassional LLQ tenderness as well as epigastric tenderness.  On evaluation of his symptoms, patient was noted to have gallbladder polyps.  Occassional increase in loose stools with fatty foods.  No melena, hematochezia.  No history of jaundice.  No fever or chills.  No nausea.  + family history of biliary disease.  No wt changes.  Review of Symptoms:  Constitutional:unremarkable headaches Eyes:unremarkable Nose/Mouth/Throat:unremarkable occ chest pain SOB, wheezing as per HPI dysuria arthralgia Skin:unremarkable Breast:unremarkable Hematolgic/Lymphatic:unremarkable Allergic/Immunologic:unremarkable   Past Medical History:Obtained   Past Medical History  Surgical History: Left forearm lesion Medical Problems: chronic headaches, HTN, DM2, hypercholesterolemia, GERD, gastroparesis Psychiatric History: Anxiety, depression Allergies: tetracyclines, sulfa, flagyl, keterolac Medications: Bupap, elavil, amlodipine, valsartan, librax, cymbalta, amaryl, lortab, glucophage, reglan, simco, protonix, phenergan, carafate, topamax   Social History:Obtained   Social History  Age: 56 Years 56 Months Alcohol: no Recreational drug(s): no   Smoking Status: Current every day smoker reviewed on 11/09/2011 Started Date: 08/31/1973 Packs per day: 1.00   Family History:Obtained   Family History              Father: gallbladder cancer    Objective Information: General:Well appearing, well nourished in no distress. Skin:no rash or prominent lesions Head:Atraumatic; no  masses; no abnormalities Eyes:conjunctiva clear, EOM intact, PERRL Mouth:Mucous membranes moist, no mucosal lesions. Neck:Supple without lymphadenopathy.  Heart:RRR, no murmur Lungs:CTA bilaterally, no wheezes, rhonchi, rales.  Breathing unlabored. Abdomen:Soft, NT/ND, no HSM, no masses. Extremities:No deformities, clubbing, cyanosis, or edema.   Assessment:  Diagnosis &amp; Procedure: DiagnosisCode: 575.11, ProcedureCode: 16109,    Plan: Chronic cholecystitis, gallbladder polyps.  Low suspicion of malignancy.  Options discussed with patient. Will proceed at his convenience.  Patient Education:Alternative treatments to surgery were discussed with patient (and family).Risks and benefits  of procedure were fully explained to the patient (and family) who gave informed consent. Patient/family questions were addressed.  Follow-up:Pending Surgery                                     Active Diagnosis and Procedures: 575.11 Chronic cholecystitis   99203 - OFFICE OUTPATIENT NEW 30 MINUTES

## 2011-11-20 NOTE — Transfer of Care (Signed)
Immediate Anesthesia Transfer of Care Note  Patient: Brian [name]  Procedure(s) Performed: Procedure(s) (LRB): LAPAROSCOPIC CHOLECYSTECTOMY (N/A) LIVER BIOPSY (N/A)  Patient Location: PACU  Anesthesia Type: General  Level of Consciousness: awake and alert   Airway & Oxygen Therapy: Patient Spontanous Breathing and Patient connected to face mask oxygen  Post-op Assessment: Report given to PACU RN  Post vital signs: Reviewed and stable  Complications: No apparent anesthesia complications

## 2011-11-20 NOTE — Interval H&P Note (Signed)
History and Physical Interval Note:  11/20/2011 7:40 AM  Brian [name]  has presented today for surgery, with the diagnosis of Calculus of gallbladder with other cholecystitis, without mention of obstruction   The various methods of treatment have been discussed with the patient and family. After consideration of risks, benefits and other options for treatment, the patient has consented to  Procedure(s) (LRB): LAPAROSCOPIC CHOLECYSTECTOMY (N/A) LIVER BIOPSY (N/A) as a surgical intervention .  The patients' history has been reviewed, patient examined, no change in status, stable for surgery.  I have reviewed the patients' chart and labs.  Questions were answered to the patient's satisfaction.     Emary Zalar C

## 2011-11-20 NOTE — Anesthesia Postprocedure Evaluation (Signed)
  Anesthesia Post-op Note  Patient: Brian [name]  Procedure(s) Performed: Procedure(s) (LRB): LAPAROSCOPIC CHOLECYSTECTOMY (N/A) LIVER BIOPSY (N/A)  Patient Location: PACU  Anesthesia Type: General  Level of Consciousness: awake and alert   Airway and Oxygen Therapy: Patient Spontanous Breathing and Patient connected to face mask oxygen  Post-op Pain: mild  Post-op Assessment: Post-op Vital signs reviewed, Patient's Cardiovascular Status Stable, Respiratory Function Stable, Patent Airway and No signs of Nausea or vomiting  Post-op Vital Signs: Reviewed and stable 85 98% 14 192/104  Complications: No apparent anesthesia complications

## 2011-11-20 NOTE — Discharge Instructions (Signed)
Liver Biopsy A liver biopsy is done to confirm or prove a suspected problem. The liver is a large organ in the upper right hand of your abdomen. To do the test, the doctor puts a small needle into the right side of your abdomen. A tiny piece of liver tissue is taken and sent for testing. This should not be painful as the skin is injected with a local anesthetic that numbs the area.  HOW A BIOPSY IS PERFORMED This is often performed as a same day surgery. This can be done in a hospital or clinic. Biopsies are often done under local anesthesia which makes the area of biopsy numb. Sometimes sedation is given to help patients relax. If you are taking blood thinning medications or medications containing aspirin, this must be discussed with your caregiver before the test. This medication may need to be stopped for up to 7 days before the procedure, or the dose may need to be changed. You should review all of your other medications with your caregiver before the test. You must remain in bed for 1 to 2 hours after the test. Having something to read may help pass the time.  LET YOUR CAREGIVERS KNOW ABOUT THE FOLLOWING:  Allergies.   Medications taken including herbs, eye drops, over -the- counter medications, and creams.   Use of steroids (by mouth or creams).   Previous problems with anesthetics or novocaine.   Possibility of pregnancy, if this applies.   History of blood clots (thrombophlebitis).   History of bleeding or blood problems.   Previous surgery.   Other health problems.  BEFORE THE PROCEDURE You should be present 60 minutes prior to your procedure or as directed. Check in at the admissions desk for filling out necessary forms if not pre-registered. There will be consent forms to sign prior to the procedure. There is a waiting area for your family while you are having your biopsy. AFTER THE PROCEDURE  After your biopsy, you will be taken to the recovery area where a nurse will  watch and check your progress.   You may have to lie on your right side for 1 to 2 hours.   Your blood pressure and pulse will be checked often.   If you are having pain or feel sick, tell your nurse.   After 1 to 2 hours, if you are going home, you may sit in a chair and get dressed. The nurse will let you know when you can get up.   Once you are doing well, barring other problems, you will be allowed to go home. Once at home, putting an ice pack on your operative site may help with discomfort and keep swelling down.   You may resume a normal diet and activities as directed.   Change dressings as directed.   Only take over-the-counter or prescription medicines for pain, discomfort, or fever as directed by your caregiver.   Call for your results as instructed by your caregiver. Remember it is your job to be sure you get the results of your biopsy and any additional tests performed on the sample taken. Do not assume everything is fine if you do not hear from your caregiver.  HOME CARE INSTRUCTIONS   You should rest for one to two days or as instructed.   You will need to have a responsible adult take you home and stay with you overnight.   Do not lift over 5 lbs. or play contact sports for two weeks.  Do not drive for 24 hours.   Do not take medication containing aspirin or drink alcohol for one week after this test.  SEEK MEDICAL CARE IF:   There is increased bleeding (more than a small spot) from the biopsy site.   You have redness, swelling, or increasing pain in the biopsy site.   You develop swelling or pain in the abdomen.   You have an unexplained oral temperature over 102 F (38.9 C).   You notice a foul smell coming from the wound or dressing.  SEEK IMMEDIATE MEDICAL CARE IF:   You develop a rash.   You have difficulty breathing.   You have allergic problems such as itching or swelling or shortness of breath.  Document Released: 11/07/2003 Document Revised:  08/06/2011 Document Reviewed: 03/27/2008 [facility] Patient Information 2012 [place], [place].  Laparoscopic Cholecystectomy Laparoscopic cholecystectomy is surgery to remove the gallbladder. The gallbladder is located slightly to the right of center in the abdomen, behind the liver. It is a concentrating and storage sac for the bile produced in the liver. Bile aids in the digestion and absorption of fats. Gallbladder disease (cholecystitis) is an inflammation of your gallbladder. This condition is usually caused by a buildup of gallstones (cholelithiasis) in your gallbladder. Gallstones can block the flow of bile, resulting in inflammation and pain. In severe cases, emergency surgery may be required. When emergency surgery is not required, you will have time to prepare for the procedure. Laparoscopic surgery is an alternative to open surgery. Laparoscopic surgery usually has a shorter recovery time. Your common bile duct may also need to be examined and explored. Your caregiver will discuss this with you if he or she feels this should be done. If stones are found in the common bile duct, they may be removed. LET YOUR CAREGIVER KNOW ABOUT:  Allergies to food or medicine.   Medicines taken, including vitamins, herbs, eyedrops, over-the-counter medicines, and creams.   Use of steroids (by mouth or creams).   Previous problems with anesthetics or numbing medicines.   History of bleeding problems or blood clots.   Previous surgery.   Other health problems, including diabetes and kidney problems.   Possibility of pregnancy, if this applies.  RISKS AND COMPLICATIONS All surgery is associated with risks. Some problems that may occur following this procedure include:  Infection.   Damage to the common bile duct, nerves, arteries, veins, or other internal organs such as the stomach or intestines.   Bleeding.   A stone may remain in the common bile duct.  BEFORE THE PROCEDURE  Do not take  aspirin for 3 days prior to surgery or blood thinners for 1 week prior to surgery.   Do not eat or drink anything after midnight the night before surgery.   Let your caregiver know if you develop a cold or other infectious problem prior to surgery.   You should be present 60 minutes before the procedure or as directed.  PROCEDURE  You will be given medicine that makes you sleep (general anesthetic). When you are asleep, your surgeon will make several small cuts (incisions) in your abdomen. One of these incisions is used to insert a small, lighted scope (laparoscope) into the abdomen. The laparoscope helps the surgeon see into your abdomen. Carbon dioxide gas will be pumped into your abdomen. The gas allows more room for the surgeon to perform your surgery. Other operating instruments are inserted through the other incisions. Laparoscopic procedures may not be appropriate when:  There is  major scarring from previous surgery.   The gallbladder is extremely inflamed.   There are bleeding disorders or unexpected cirrhosis of the liver.   A pregnancy is near term.   Other conditions make the laparoscopic procedure impossible.  If your surgeon feels it is not safe to continue with a laparoscopic procedure, he or she will perform an open abdominal procedure. In this case, the surgeon will make an incision to open the abdomen. This gives the surgeon a larger view and field to work within. This may allow the surgeon to perform procedures that sometimes cannot be performed with a laparoscope alone. Open surgery has a longer recovery time. AFTER THE PROCEDURE  You will be taken to the recovery area where a nurse will watch and check your progress.   You may be allowed to go home the same day.   Do not resume physical activities until directed by your caregiver.   You may resume a normal diet and activities as directed.  Document Released: 08/17/2005 Document Revised: 08/06/2011 Document Reviewed:  01/30/2011 [facility] Patient Information 2012 [place], [place].

## 2011-11-20 NOTE — Op Note (Signed)
Patient:  Brian [name]  DOB:  01/24/1956  MRN:  [id number]   Preop Diagnosis:  Acute cholecystitis, fatty liver  Postop Diagnosis:  The same  Procedure:  Laparoscopic cholecystectomy, laparoscopic liver biopsy  Surgeon:  Dr. Tilford Pillar  Anes:  General endotracheal, 0.5% cystic and plain for local  Indications:  Patient is a 56 year old male presented my office with a history of epigastric and right upper quadrant abdominal pain. Workup and evaluation was consistent for cholelithiasis acute cholecystitis. He also had a history of fatty liver disease risks benefits and alternatives of a laparoscopic possible open cholecystectomy with liver biopsy were discussed at length with the patient including but not limited to risk of bleeding, infection, bile leak, small bowel injury, common bile duct injury, intraoperative cardiac or pulmonary events. His questions and concerns were addressed the patient was consented for the planned procedure.  Procedure note:  Patient is taken to the or is placed in supine position they are table time the general anesthetic is administered. Once patient was asleep he symmetrically intubated by the nurse anesthetist. At this point his abdomen is prepped with DuraPrep solution and draped in standard fashion. A stab incisions created supraumbilically with 11 blade scalpel. Additional dissection down to subcuticular tissues carried out using a Coker clamp which is utilized to grasp the anterior abdominal fascia and lift this anteriorly. A Veress needle is inserted saline drop test is utilized confirm intraperitoneal placement. At this point pneumoperitoneum was initiated. Once sufficient pneumoperitoneum was obtained an 11 mm trochars inserted over a laparoscope allowing visualization the trocar entering into the peritoneal cavity. At this point the inner cannulas removed the laparoscope was reinserted. There is no evidence of any trocar or Veress needle placement injury.  At this point the remaining trochars replaced with a 5 mm in the epigastrium. A 5 mm in the midline and a 5 mm trocar in the right lateral abdominal wall. Patient's placed into a reverse Trendelenburg left lateral decubitus position. The fundus of the gallbladder is identified and lifted up and over the right lobe the liver. Blunt dissection is carried out to strip the peritoneal reflection off the infundibulum exposing both the cystic duct and cystic arteries and her into the infundibulum. A window was created behind both the cystic duct and cystic arteries. 3 endoclips placed proximally one distally on the cystic duct was divided between 2 most is a clips. Similarly 2 endoclips placed proximally one distally and the cystic artery and the cystic artery was divided between 2 most distal clips. A posterior branch was identified and 2 additional clips are placed. Electrocautery was then utilized dissect the gallbladder free from the gallbladder fossa. Once color 3 the 10 mm scope is exchanged for a 5 in Endo Catch bag was placed into the abdominal cavity and the gallbladder is placed into the Endo Catch bag. It has takes place in the right lower quadrant. At this point inspection of the gallbladder fossa indicate excellent hemostasis having been attainable electrocautery. The endoclips were noted to be in excellent position with no evidence of any bleeding or bile leak. At this point her my attention to closure and performing the biopsy. An Endo Close suture passing device was utilized to pass a 2-0 Vicryl suture through the umbilical trocar site. With this suture and placed up her my attention to the biopsy. A separate stab incision was created in the right subcostal margin with a 11 blade scalpel and the biopsy needle was advanced. Several passes were  taken with several small plugs of liver tissue obtained. These were sent as a permanent specimen to pathology. The capsular defects in the liver were cauterized with  electrocautery. Hemostasis is excellent. At this time attention was turned to completion of the operation the gallbladder was retrieved was removed through the umbilical trocar site and intact Endo Catch bag. At this point the pneumoperitoneum was evacuated. The trochars were removed. The Vicryl sutures secured. Local anesthetic was instilled. A 4-0 Monocryl sutures utilized reapproximate the skin edges at all 4 trocar sites. Skin was washed dried moist dry towel. Benzoin is applied around all 5 incisions and half-inch Steri-Strips are placed. The drapes removed the patient left come out of general anesthetic and stretcher the PACU in stable condition. At the conclusion of procedure all instrument, sponge, needle counts are correct. Patient tolerated procedure extremely well.  Complications:  None apparent  EBL:  Less than 50 mL's  Specimen:  Gallbladder, liver biopsies

## 2011-11-20 NOTE — Anesthesia Procedure Notes (Signed)
Procedure Name: Intubation Date/Time: 11/20/2011 8:06 AM Performed by: Glynn Octave E Pre-anesthesia Checklist: Patient identified, Patient being monitored, Timeout performed, Emergency Drugs available and Suction available Patient Re-evaluated:Patient Re-evaluated prior to inductionOxygen Delivery Method: Circle System Utilized Preoxygenation: Pre-oxygenation with 100% oxygen Intubation Type: IV induction and Cricoid Pressure applied Ventilation: Mask ventilation without difficulty Laryngoscope Size: Mac and 3 Grade View: Grade II Tube type: Oral Tube size: 7.0 mm Number of attempts: 1 Airway Equipment and Method: stylet Placement Confirmation: ETT inserted through vocal cords under direct vision,  positive ETCO2 and breath sounds checked- equal and bilateral Secured at: 23 cm Tube secured with: Tape Dental Injury: Teeth and Oropharynx as per pre-operative assessment

## 2011-11-20 NOTE — Anesthesia Preprocedure Evaluation (Signed)
Anesthesia Evaluation  Patient identified by MRN, date of birth, ID band Patient awake    Reviewed: Allergy & Precautions, H&P , NPO status , Patient's Chart, lab work & pertinent test results  Airway Mallampati: I TM Distance: >3 FB Neck ROM: Full    Dental No notable dental hx.    Pulmonary    Pulmonary exam normal       Cardiovascular hypertension, Pt. on medications Rhythm:Regular Rate:Normal     Neuro/Psych  Headaches, Seizures -, Well Controlled,  negative psych ROS   GI/Hepatic GERD-  Medicated and Controlled,  Endo/Other  Diabetes mellitus-, Well Controlled, Type 2, Oral Hypoglycemic Agents  Renal/GU      Musculoskeletal negative musculoskeletal ROS (+)   Abdominal Normal abdominal exam  (+)   Peds  Hematology negative hematology ROS (+)   Anesthesia Other Findings   Reproductive/Obstetrics                           Anesthesia Physical Anesthesia Plan  ASA: II  Anesthesia Plan: General   Post-op Pain Management:    Induction: Intravenous  Airway Management Planned: Oral ETT  Additional Equipment:   Intra-op Plan:   Post-operative Plan: Extubation in OR  Informed Consent: I have reviewed the patients History and Physical, chart, labs and discussed the procedure including the risks, benefits and alternatives for the proposed anesthesia with the patient or authorized representative who has indicated his/her understanding and acceptance.   Dental advisory given  Plan Discussed with: CRNA  Anesthesia Plan Comments:         Anesthesia Quick Evaluation

## 2011-11-22 MED ORDER — SODIUM CHLORIDE 0.9 % IR SOLN
Status: AC | PRN
  Administered 2011-11-20: 1000 mL

## 2011-11-23 ENCOUNTER — Encounter (HOSPITAL_COMMUNITY): Payer: Self-pay | Admitting: General Surgery

## 2011-12-15 ENCOUNTER — Ambulatory Visit (INDEPENDENT_AMBULATORY_CARE_PROVIDER_SITE_OTHER): Payer: Medicaid Other | Admitting: Internal Medicine

## 2011-12-15 ENCOUNTER — Encounter: Payer: Self-pay | Admitting: Internal Medicine

## 2011-12-15 VITALS — BP 126/75 | HR 94 | Temp 98.2°F | Ht 72.0 in | Wt 219.0 lb

## 2011-12-15 DIAGNOSIS — K760 Fatty (change of) liver, not elsewhere classified: Secondary | ICD-10-CM

## 2011-12-15 DIAGNOSIS — K7689 Other specified diseases of liver: Secondary | ICD-10-CM

## 2011-12-15 DIAGNOSIS — K219 Gastro-esophageal reflux disease without esophagitis: Secondary | ICD-10-CM

## 2011-12-15 DIAGNOSIS — R197 Diarrhea, unspecified: Secondary | ICD-10-CM | POA: Insufficient documentation

## 2011-12-15 LAB — IGA: IgA: 129 mg/dL (ref 68–379)

## 2011-12-15 NOTE — Assessment & Plan Note (Signed)
Multiple GI issues including chronic left-sided abdominal pain loose stools but not multiple stools daily. Reported gastroparesis on Reglan. No documentation of delayed gastric emptying at hand for review. Coarseness which may or may not be related to acid reflux. Suboptimal response to protonic; Dexilant work better previously. Not I talked any liver disease with bland steatosis on biopsy. Normal LFTs. Abnormal stomach one year ago elsewhere but biopsies and CT negative. No concerning alarm symptoms at this time. History of colonic adenomas O. multiple prior colonoscopies. I reviewed Varden-term side effects of Reglan with the patient. He is on multiple medications.   I do not see a need for urgent endoscopic evaluation this time. Recommendations: Fecal occult blood test  Stop Protonix;  Try Dexilant 60 mg orally daily samples provided  10 pounds of weight loss over the next 6 months  Transglutaminase IgA level and total IgA level  Office visit here in 3 months

## 2011-12-15 NOTE — Patient Instructions (Signed)
Aerobic exercise 30 minute 3x weekly  Loose 10 pounds in next 6 months  TTG and IGA blood work  Stop protonix; begin Dexilant 60 mg daily - provide samples  Stool check for blood  Office visit in 3 months  Would like to see you take less Reglan in the future; side effects reviewed

## 2011-12-15 NOTE — Progress Notes (Signed)
Primary Care Physician:  Denita Lung, DO, DO Primary Gastroenterologist:  Dr.   Pre-Procedure History & Physical: HPI:  Brian [name] is a 56 y.o. male here for followup status post cholecystectomy for gallbladder "polyps". Path report demonstrates chronic cholecystitis and cholelithiasis- not polyps. Liver biopsy demonstrated  bland steatosis without  fibrosis and no significant inflammation. History of colonic adenomas on . multiple colonoscopies; he'll be due for surveillance exam in 3 years- last exam 2 years ago in [place]). 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 Has chronic diarrhea and intermittent left-sided abdominal pain-  but no more than 2 bowel movements daily. No melena rectal bleeding. Is unaware of his hemoglobin A1c. Has been on Reglan since 2008. Does not recall having a gastric emptying study. No side effects so far. He had significant hoarseness after his EGD and has hoarseness now 3 weeks after endotracheal intubation related to cholecystectomy. His aminotransferases have been normal through this office. CBC okay. His medications also include a simvastatin/niacin preparation. He states he was on Dexilant previously to work better for him protonix is working currently.  He has additional records with him today. Previous H. pyloric infection treated. History of diverticulosis on CT and colonoscopy previously but no documented diverticulitis.  Past Medical History  Diagnosis Date  . DM (diabetes mellitus)   . HTN (hypertension)   . IBS (irritable bowel syndrome)   . GERD (gastroesophageal reflux disease)   . High cholesterol   . Chronic abdominal pain 08/11/11    Reported per patient, Dr. Ralene Cork in [place]  . Adenomatous colon polyp   . Seizures     1 seizure with severe back pain. no further meds or seizures  . Headache     migraines    Past Surgical History  Procedure Date  . Tonsillectomy   . Arm surgery    tendon/left  . Esophagogastroduodenoscopy 09/02/10    [facility], Dr. 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 pylori  . Esophagogastroduodenoscopy 02/03/2006    Dr. Steele Berg White-> hiatal hernia, atrial erosions  . Cholecystectomy 11/20/2011    Procedure: LAPAROSCOPIC CHOLECYSTECTOMY;  Surgeon: Fabio Bering, MD;  Location: [facility];  Service: General;  Laterality: N/A;  . Liver biopsy 11/20/2011    Procedure: LIVER BIOPSY;  Surgeon: Fabio Bering, MD;  Location: [facility];  Service: General;  Laterality: N/A;    Prior to Admission medications   Medication Sig Start Date End Date Taking? Authorizing Provider  ACETAMINOPHEN-BUTALBITAL (BUPAP) 50-650 MG TABS Take 1 tablet by mouth every 6 (six) hours as needed. For headaches   Yes Historical Provider, MD  amitriptyline (ELAVIL) 100 MG tablet Take 100 mg by mouth at bedtime.   Yes Historical Provider, MD  amLODipine-valsartan (EXFORGE) 5-160 MG per tablet Take 1 tablet by mouth 2 (two) times daily.   Yes Historical Provider, MD  BIOTIN PO Take 10,000 mg by mouth daily.   Yes Historical Provider, MD  clidinium-chlordiazePOXIDE (LIBRAX) 2.5-5 MG per capsule Take 1 capsule by mouth 3 (three) times daily as needed. For stomach   Yes Historical Provider, MD  diphenhydrAMINE (BENADRYL) 25 MG tablet Take 25 mg by mouth 2 (two) times daily.   Yes Historical Provider, MD  DULoxetine (CYMBALTA) 60 MG capsule Take 60 mg by mouth daily.   Yes Historical  Provider, MD  glimepiride (AMARYL) 4 MG tablet Take 4 mg by mouth daily before breakfast.   Yes Historical Provider, MD  HYDROcodone-acetaminophen (LORTAB) 10-500 MG per tablet Take 1 tablet by mouth every 6 (six) hours as needed. For pain   Yes Historical Provider, MD  metaxalone (SKELAXIN) 800 MG tablet Take 800 mg  by mouth 3 (three) times daily as needed. For muscle spams   Yes Historical Provider, MD  metFORMIN (GLUCOPHAGE) 500 MG tablet Take 500 mg by mouth every evening.    Yes Historical Provider, MD  metoCLOPramide (REGLAN) 10 MG tablet Take 10 mg by mouth 4 (four) times daily.   Yes Historical Provider, MD  MILK THISTLE PO Take 1,000 mg by mouth daily.   Yes Historical Provider, MD  Multiple Vitamin (MULTIVITAMIN) capsule Take 1 capsule by mouth daily.   Yes Historical Provider, MD  Niacin-Simvastatin [facility]) 500-40 MG TB24 Take 1 tablet by mouth daily.    Yes Historical Provider, MD  pantoprazole (PROTONIX) 40 MG tablet Take 40 mg by mouth daily.   Yes Historical Provider, MD  promethazine (PHENERGAN) 25 MG tablet Take 25 mg by mouth every 6 (six) hours as needed. For nausea   Yes Historical Provider, MD  sucralfate (CARAFATE) 1 G tablet Take 1 g by mouth 4 (four) times daily.   Yes Historical Provider, MD  topiramate (TOPAMAX) 100 MG tablet Take 100 mg by mouth 2 (two) times daily.   Yes Historical Provider, MD    Allergies as of 12/15/2011 - Review Complete 12/15/2011  Allergen Reaction Noted  . Ketorolac tromethamine  10/27/2011  . Sulfamethoxazole Rash 10/27/2011  . Tetracyclines & related Rash 10/27/2011    Family History  Problem Relation Age of Onset  . Cancer Father 48    Gallbladder  . Anesthesia problems Neg Hx   . Hypotension Neg Hx   . Malignant hyperthermia Neg Hx   . Pseudochol deficiency Neg Hx     History   Social History  . Marital Status: Single    Spouse Name: N/A    Number of Children: 1  . Years of Education: N/A   Occupational History  . disabled    Social History Main Topics  . Smoking status: Current Everyday Smoker -- 1.0 packs/day for 42 years    Types: Cigarettes  . Smokeless tobacco: Not on file  . Alcohol Use: No     last etoh 2003, daily up until then  . Drug Use: No  . Sexually Active: Yes    Birth Control/ Protection: None   Other Topics  Concern  . Not on file   Social History Narrative   Divorced, moved to [facility] November 2012    Review of Systems: See HPI, otherwise negative ROS  Physical Exam: BP 126/75  Pulse 94  Temp(Src) 98.2 F (36.8 C) (Temporal)  Ht 6' (1.829 m)  Wt 219 lb (99.338 kg)  BMI 29.70 kg/m2 General:   Alert,  Well-developed, well-nourished, pleasant and cooperative in NAD Skin:  Intact without significant lesions or rashes. Eyes:  Sclera clear, no icterus.   Conjunctiva pink. Ears:  Normal auditory acuity. Nose:  No deformity, discharge,  or lesions. Mouth:  No deformity or lesions. Neck:  Supple; no masses or thyromegaly. No significant cervical adenopathy. Lungs:  Clear throughout to auscultation.   No wheezes, crackles, or rhonchi. No acute distress. Heart:  Regular rate and rhythm; no murmurs, clicks, rubs,  or gallops. Abdomen: Non-distended, normal bowel sounds.  Soft and nontender  without appreciable mass or hepatosplenomegaly. No succussion splash Pulses:  Normal pulses noted. Extremities:  Without clubbing or edema.

## 2011-12-16 LAB — TISSUE TRANSGLUTAMINASE, IGA: Tissue Transglutaminase Ab, IgA: 2.6 U/mL (ref <20)

## 2012-01-05 ENCOUNTER — Encounter (HOSPITAL_COMMUNITY): Payer: Self-pay

## 2012-01-05 ENCOUNTER — Emergency Department (HOSPITAL_COMMUNITY): Payer: Medicaid Other

## 2012-01-05 ENCOUNTER — Emergency Department (HOSPITAL_COMMUNITY)
Admission: EM | Admit: 2012-01-05 | Discharge: 2012-01-05 | Disposition: A | Discharge status: Home / Self Care | Payer: Medicaid Other | Attending: Emergency Medicine | Admitting: Emergency Medicine

## 2012-01-05 DIAGNOSIS — E119 Type 2 diabetes mellitus without complications: Secondary | ICD-10-CM | POA: Insufficient documentation

## 2012-01-05 DIAGNOSIS — R109 Unspecified abdominal pain: Secondary | ICD-10-CM | POA: Insufficient documentation

## 2012-01-05 DIAGNOSIS — R197 Diarrhea, unspecified: Secondary | ICD-10-CM | POA: Insufficient documentation

## 2012-01-05 DIAGNOSIS — Z9089 Acquired absence of other organs: Secondary | ICD-10-CM | POA: Insufficient documentation

## 2012-01-05 DIAGNOSIS — I10 Essential (primary) hypertension: Secondary | ICD-10-CM | POA: Insufficient documentation

## 2012-01-05 DIAGNOSIS — Z79899 Other long term (current) drug therapy: Secondary | ICD-10-CM | POA: Insufficient documentation

## 2012-01-05 DIAGNOSIS — R110 Nausea: Secondary | ICD-10-CM | POA: Insufficient documentation

## 2012-01-05 DIAGNOSIS — R0789 Other chest pain: Secondary | ICD-10-CM | POA: Insufficient documentation

## 2012-01-05 LAB — BASIC METABOLIC PANEL
BUN: 17 mg/dL (ref 6–23)
CO2: 23 mEq/L (ref 19–32)
Calcium: 9.5 mg/dL (ref 8.4–10.5)
Chloride: 107 mEq/L (ref 96–112)
Creatinine, Ser: 0.97 mg/dL (ref 0.50–1.35)
GFR calc Af Amer: >90 mL/min (ref >90)
GFR calc non Af Amer: >90 mL/min (ref >90)
Glucose, Bld: 159 mg/dL — ABNORMAL HIGH (ref 70–99)
Potassium: 4 mEq/L (ref 3.5–5.1)
Sodium: 138 mEq/L (ref 135–145)

## 2012-01-05 LAB — DIFFERENTIAL
Basophils Absolute: 0.1 10*3/uL (ref 0.0–0.1)
Basophils Relative: 1 % (ref 0–1)
Eosinophils Absolute: 0.3 10*3/uL (ref 0.0–0.7)
Eosinophils Relative: 4 % (ref 0–5)
Lymphocytes Relative: 37 % (ref 12–46)
Lymphs Abs: 3 10*3/uL (ref 0.7–4.0)
Monocytes Absolute: 0.8 10*3/uL (ref 0.1–1.0)
Monocytes Relative: 9 % (ref 3–12)
Neutro Abs: 4 10*3/uL (ref 1.7–7.7)
Neutrophils Relative %: 49 % (ref 43–77)

## 2012-01-05 LAB — CBC
HCT: 43.6 % (ref 39.0–52.0)
Hemoglobin: 14.6 g/dL (ref 13.0–17.0)
MCH: 31.7 pg (ref 26.0–34.0)
MCHC: 33.5 g/dL (ref 30.0–36.0)
MCV: 94.8 fL (ref 78.0–100.0)
Platelets: 213 10*3/uL (ref 150–400)
RBC: 4.6 MIL/uL (ref 4.22–5.81)
RDW: 12.7 % (ref 11.5–15.5)
WBC: 8.2 10*3/uL (ref 4.0–10.5)

## 2012-01-05 MED ORDER — LACTATED RINGERS IV SOLN
INTRAVENOUS | Status: DC
  Administered 2012-01-05: 12:00:00 via INTRAVENOUS

## 2012-01-05 MED ORDER — ONDANSETRON HCL 4 MG/2ML IJ SOLN
4.0000 mg | Freq: Once | INTRAMUSCULAR | Status: AC
  Administered 2012-01-05: 4 mg via INTRAVENOUS
  Filled 2012-01-05: qty 2

## 2012-01-05 MED ORDER — HYDROMORPHONE HCL PF 1 MG/ML IJ SOLN
1.0000 mg | Freq: Once | INTRAMUSCULAR | Status: AC
  Administered 2012-01-05: 1 mg via INTRAVENOUS
  Filled 2012-01-05: qty 1

## 2012-01-05 MED ORDER — SODIUM CHLORIDE 0.9 % IV BOLUS (SEPSIS)
1000.0000 mL | Freq: Once | INTRAVENOUS | Status: AC
  Administered 2012-01-05: 1000 mL via INTRAVENOUS

## 2012-01-05 MED ORDER — HYDROMORPHONE HCL PF 1 MG/ML IJ SOLN: 1.0000 mg | INTRAMUSCULAR | Status: DC | PRN

## 2012-01-05 MED ORDER — IOHEXOL 300 MG/ML  SOLN
100.0000 mL | Freq: Once | INTRAMUSCULAR | Status: AC | PRN
  Administered 2012-01-05: 100 mL via INTRAVENOUS

## 2012-01-05 MED ORDER — DOCUSATE SODIUM 100 MG PO CAPS: 100.0000 mg | ORAL_CAPSULE | Freq: Two times a day (BID) | ORAL | Status: AC

## 2012-01-05 MED ORDER — PHOSPHATE ENEMA 7-19 GM/118ML RE ENEM: 1.0000 | ENEMA | Freq: Once | RECTAL | Status: AC | PRN

## 2012-01-05 NOTE — ED Provider Notes (Addendum)
History   This chart was scribed for Joya Gaskins, MD by Clarita Crane. The patient was seen in room APA03/APA03. Patient's care was started at 1132.    CSN: [id number]  Arrival date & time 01/05/12  1132   First MD Initiated Contact with Patient 01/05/12 1146      Chief Complaint  Patient presents with  . Abdominal Pain    HPI Brian [name] is a 55 y.o. male who presents to the Emergency Department after referral by Dr. Leticia Penna complaining of waxing and waning moderate to severe LLQ abdominal pain similar to previous episodes of diverticulitis onset 1 week ago and persistent since with associated nausea. Reports he will experience moderate chest tightness during episodes in which abdominal pain becomes significantly worse. Denies SOB, vomiting. Patient with h/o diabetes, HTN, IBS, GERD, high cholesterol.  Past Medical History  Diagnosis Date  . DM (diabetes mellitus)   . HTN (hypertension)   . IBS (irritable bowel syndrome)   . GERD (gastroesophageal reflux disease)   . High cholesterol   . Chronic abdominal pain 08/11/11    Reported per patient, Dr. Ralene Cork in [place]  . Adenomatous colon polyp   . Seizures     1 seizure with severe back pain. no further meds or seizures  . Headache     migraines    Past Surgical History  Procedure Date  . Tonsillectomy   . Arm surgery     tendon/left  . Esophagogastroduodenoscopy 09/02/10    [facility], Dr. 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 pylori  . Esophagogastroduodenoscopy 02/03/2006    Dr. Steele Berg White-> hiatal hernia, atrial erosions  . Cholecystectomy 11/20/2011    Procedure: LAPAROSCOPIC CHOLECYSTECTOMY;  Surgeon: Fabio Bering, MD;  Location: [facility];  Service: General;  Laterality: N/A;  . Liver biopsy 11/20/2011   Procedure: LIVER BIOPSY;  Surgeon: Fabio Bering, MD;  Location: [facility];  Service: General;  Laterality: N/A;    Family History  Problem Relation Age of Onset  . Cancer Father 58    Gallbladder  . Anesthesia problems Neg Hx   . Hypotension Neg Hx   . Malignant hyperthermia Neg Hx   . Pseudochol deficiency Neg Hx     History  Substance Use Topics  . Smoking status: Current Everyday Smoker -- 1.0 packs/day for 42 years    Types: Cigarettes  . Smokeless tobacco: Not on file  . Alcohol Use: No     last etoh 2003, daily up until then      Review of Systems  Constitutional: Negative for fever and chills.  HENT: Negative for congestion.   Respiratory: Positive for chest tightness. Negative for cough.   Cardiovascular: Negative for palpitations.  Gastrointestinal: Positive for nausea, abdominal pain and diarrhea. Negative for vomiting.  Genitourinary: Negative for dysuria.  Musculoskeletal: Negative for back pain.  Skin: Negative for color change.  Neurological: Negative for weakness.  Psychiatric/Behavioral: The patient is not nervous/anxious.      Allergies  Ketorolac tromethamine; Sulfamethoxazole; and Tetracyclines & related  Home Medications   Current Outpatient Rx  Name Route Sig Dispense Refill  . BUTALBITAL-ACETAMINOPHEN 50-650 MG PO TABS Oral Take 1 tablet by mouth every 6 (six) hours as needed. For headaches    . AMITRIPTYLINE HCL 100 MG PO TABS Oral Take 100 mg by mouth at  bedtime.    Marland Kitchen AMLODIPINE BESYLATE-VALSARTAN 5-160 MG PO TABS Oral Take 1 tablet by mouth 2 (two) times daily.    Marland Kitchen BIOTIN PO Oral Take 10,000 mg by mouth daily.    Marland Kitchen CLINDINIUM-CHLORDIAZEPOXIDE 2.5-5 MG PO CAPS Oral Take 1 capsule by mouth 3 (three) times daily as needed. For stomach    . DIPHENHYDRAMINE HCL 25 MG PO TABS Oral Take 25 mg by mouth 2 (two) times daily.    . DULOXETINE HCL 60 MG PO CPEP Oral Take 60 mg by mouth daily.    Marland Kitchen GLIMEPIRIDE 4 MG PO TABS Oral Take 4 mg by mouth daily  before breakfast.    . HYDROCODONE-ACETAMINOPHEN 10-500 MG PO TABS Oral Take 1 tablet by mouth every 6 (six) hours as needed. For pain    . METAXALONE 800 MG PO TABS Oral Take 800 mg by mouth 3 (three) times daily as needed. For muscle spams    . METFORMIN HCL 500 MG PO TABS Oral Take 500 mg by mouth every evening.     Marland Kitchen METOCLOPRAMIDE HCL 10 MG PO TABS Oral Take 10 mg by mouth 4 (four) times daily.    Marland Kitchen MILK THISTLE PO Oral Take 1,000 mg by mouth daily.    . MULTIVITAMINS PO CAPS Oral Take 1 capsule by mouth daily.    Marland Kitchen NIACIN-SIMVASTATIN ER 500-40 MG PO TB24 Oral Take 1 tablet by mouth daily.     Marland Kitchen PANTOPRAZOLE SODIUM 40 MG PO TBEC Oral Take 40 mg by mouth daily.    Marland Kitchen PROMETHAZINE HCL 25 MG PO TABS Oral Take 25 mg by mouth every 6 (six) hours as needed. For nausea    . SUCRALFATE 1 G PO TABS Oral Take 1 g by mouth 4 (four) times daily.    . TOPIRAMATE 100 MG PO TABS Oral Take 100 mg by mouth 2 (two) times daily.      BP 142/76  Pulse 103  Temp(Src) 98.5 F (36.9 C) (Oral)  Resp 20  Ht 6' (1.829 m)  Wt 225 lb (102.059 kg)  BMI 30.52 kg/m2  SpO2 98%  Physical Exam CONSTITUTIONAL: Well developed/well nourished HEAD AND FACE: Normocephalic/atraumatic EYES: EOMI/PERRL ENMT: Mucous membranes moist NECK: supple no meningeal signs CV: S1/S2 noted, no murmurs/rubs/gallops noted LUNGS: Lungs are clear to auscultation bilaterally, no apparent distress ABDOMEN: soft, no rebound or guarding, LLQ tenderness NEURO: Pt is awake/alert, moves all extremitiesx4 EXTREMITIES: pulses normal, full ROM SKIN: warm, color normal PSYCH: no abnormalities of mood noted  ED Course  Procedures   DIAGNOSTIC STUDIES: Oxygen Saturation is 98% on room air, normal by my interpretation.    COORDINATION OF CARE: 12:05PM-Patient informed of current plan for treatment and evaluation and agrees with plan at this time. Patient was referred to ED by DR. Leticia Penna who has already placed orders for patient.  2:17  PM Discussed ct/labs findings with dr zieglar Requests another round of pain meds and will see in ED Pt still reporting pain 3:12 PM Seen by dr zieglar who feels he is safe for d/c Pt is comfortable with plan, feels improved     Labs Reviewed  BASIC METABOLIC PANEL - Abnormal; Notable for the following:    Glucose, Bld 159 (*)    All other components within normal limits  CBC  DIFFERENTIAL   Ct Abdomen Pelvis W Contrast  01/05/2012  *RADIOLOGY REPORT*  Clinical Data: Abdominal pain.  History of hypertension, diabetes and cholecystectomy.  Question diverticulitis.  CT ABDOMEN AND PELVIS  WITH CONTRAST  Technique:  Multidetector CT imaging of the abdomen and pelvis was performed following the standard protocol during bolus administration of intravenous contrast.  Contrast: OMNIPAQUE IOHEXOL 300 MG/ML  SOLN  Comparison: Abdominal pelvic CT 03/13/2010.  Findings: The lung bases are clear and there is no pleural effusion.  The liver, biliary system and pancreas appear unremarkable status post cholecystectomy.  The spleen and adrenal glands appear normal.  There are stable low-density renal lesions bilaterally most consistent with cysts.  There is stable scarring in the lower pole of the left kidney.  Stool is present throughout the colon.  There is no surrounding inflammatory change or fluid collection.  The appendix appears normal.  No small bowel abnormalities are identified.  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  There are stable dystrophic calcifications within the prostate gland.  The sacroiliac joints appear unchanged.  There is stable mild lumbar spondylosis and Schmorl's node formation.  IMPRESSION:  1.  Overall stable examination.  No acute abdominal pelvic findings identified.  Specifically, no evidence of diverticulitis. 2.  No evidence of appendicitis. 3.  Stable low-density renal lesions bilaterally consistent with cysts.  Original Report Authenticated  By: Gerrianne Scale, M.D.        MDM  Nursing notes reviewed and considered in documentation All labs/vitals reviewed and considered       I personally performed the services described in this documentation, which was scribed in my presence. The recorded information has been reviewed and considered.      Joya Gaskins, MD 01/05/12 1513  Joya Gaskins, MD 01/05/12 [phone]

## 2012-01-05 NOTE — ED Notes (Addendum)
Pt c/o LLQ pain x 1 week.  Went to Dr. Jerald Kief office and was sent here for R/O diverticulitis.  Orders obtained from Dr. Girtha Rm.

## 2012-01-05 NOTE — Progress Notes (Signed)
4:30 PM Pt was under observation because he had been medicated with narcotic pain medicine several hours ago and has no one to drive him home.  He is now up, awake and alert, feels safe to drive.  Released.

## 2012-01-05 NOTE — Discharge Instructions (Signed)
Abdominal Pain Abdominal pain can be caused by many things. Your caregiver decides the seriousness of your pain by an examination and possibly blood tests and X-rays. Many cases can be observed and treated at home. Most abdominal pain is not caused by a disease and will probably improve without treatment. However, in many cases, more time must pass before a clear cause of the pain can be found. Before that point, it may not be known if you need more testing, or if hospitalization or surgery is needed. HOME CARE INSTRUCTIONS   Do not take laxatives unless directed by your caregiver.   Take pain medicine only as directed by your caregiver.   Only take over-the-counter or prescription medicines for pain, discomfort, or fever as directed by your caregiver.   Try a clear liquid diet (broth, tea, or water) for as [place] as directed by your caregiver. Slowly move to a bland diet as tolerated.  SEEK IMMEDIATE MEDICAL CARE IF:   The pain does not go away.   You have a fever.   You keep throwing up (vomiting).   The pain is felt only in portions of the abdomen. Pain in the right side could possibly be appendicitis. In an adult, pain in the left lower portion of the abdomen could be colitis or diverticulitis.   You pass bloody or black tarry stools.  MAKE SURE YOU:   Understand these instructions.   Will watch your condition.   Will get help right away if you are not doing well or get worse.  Document Released: 05/27/2005 Document Revised: 08/06/2011 Document Reviewed: 04/04/2008 Exit[employer] Patient Information 2012 [employer]. 

## 2012-01-14 ENCOUNTER — Encounter: Payer: Self-pay | Admitting: Gastroenterology

## 2012-01-14 ENCOUNTER — Ambulatory Visit (INDEPENDENT_AMBULATORY_CARE_PROVIDER_SITE_OTHER): Payer: Medicaid Other | Admitting: Gastroenterology

## 2012-01-14 DIAGNOSIS — R1310 Dysphagia, unspecified: Secondary | ICD-10-CM

## 2012-01-14 DIAGNOSIS — R197 Diarrhea, unspecified: Secondary | ICD-10-CM

## 2012-01-14 DIAGNOSIS — R109 Unspecified abdominal pain: Secondary | ICD-10-CM

## 2012-01-14 DIAGNOSIS — G8929 Other chronic pain: Secondary | ICD-10-CM

## 2012-01-14 MED ORDER — DICYCLOMINE HCL 10 MG PO CAPS: ORAL_CAPSULE | ORAL | Status: DC

## 2012-01-14 MED ORDER — PANTOPRAZOLE SODIUM 40 MG PO TBEC: 40.0000 mg | DELAYED_RELEASE_TABLET | Freq: Two times a day (BID) | ORAL | Status: DC

## 2012-01-14 NOTE — Progress Notes (Signed)
Faxed to PCP

## 2012-01-14 NOTE — Progress Notes (Signed)
Referring Provider: Denita Lung, DO Primary Care Physician:  Denita Lung, DO, DO Primary Gastroenterologist: Dr. Jena Gauss   Chief Complaint  Patient presents with  . Dysphagia    HPI:   Brian [name] presents today with hx of fatty liver, GERD, IBS. Actually just seen one month ago in our office, with noted multiple GI issues to include chronic left-sided abdominal pain, hoarseness, possible gastroparesis on Reglan. Started on Dexilant, as he has had a better response to this in the past, with recommendations of 10 lbs wt loss over next 6 mos and to return ifobt. Celiac panel negative. I do not see where he submitted an ifobt.   Returns today with reports of dysphagia. States has tickling in throat since chole. Difficulty with swallowing since last Friday. +pill dysphagia, solid food dysphagia. "irritating". +odynophagia. Feels like he has a knot in his throat, "on his vocal cords". Tried Dexilant but felt like esophagus was sore. Returned to [employer] [facility] for about a week. Believes some type of dilation in past.   Friday LUQ pain, severe, sent to ED by Dr. Leticia Penna, CT negative for diverticulitis. States LUQ pain worse with eating. No melena. Abdominal pain is chronic.   Has postprandial stools, baseline for pt. Asking for refill on hydrocodone. States his PCP will not refill this.   He has had 3 CT scans since July 2011.  Most recent Jan 05, 2012:  1. Overall stable examination. No acute abdominal pelvic findings  identified. Specifically, no evidence of diverticulitis.  2. No evidence of appendicitis.  3. Stable low-density renal lesions bilaterally consistent with  cysts.   Past Medical History  Diagnosis Date  . DM (diabetes mellitus)   . HTN (hypertension)   . IBS (irritable bowel syndrome)   . GERD (gastroesophageal reflux disease)   . High cholesterol   . Chronic abdominal pain 08/11/11    Reported per patient, Dr. Ralene Cork in [place]  . Adenomatous colon polyp     due for  surveillance 2016 per epic notes, performed at outside facility   . Seizures     1 seizure with severe back pain. no further meds or seizures  . Headache     migraines    Past Surgical History  Procedure Date  . Tonsillectomy   . Arm surgery     tendon/left  . Esophagogastroduodenoscopy 09/02/10    [facility], Dr. 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 pylori  . Esophagogastroduodenoscopy 02/03/2006    Dr. Steele Berg White-> hiatal hernia, atrial erosions  . Cholecystectomy 11/20/2011    Procedure: LAPAROSCOPIC CHOLECYSTECTOMY;  Surgeon: Fabio Bering, MD;  Location: [facility];  Service: General;  Laterality: N/A;  . Liver biopsy 11/20/2011    Procedure: LIVER BIOPSY;  Surgeon: Fabio Bering, MD;  Location: [facility];  Service: General;  Laterality: N/A;    Current Outpatient Prescriptions  Medication Sig Dispense Refill  . ACETAMINOPHEN-BUTALBITAL (BUPAP) 50-650 MG TABS Take 1 tablet by mouth every 6 (six) hours as needed. For headaches      . amitriptyline (ELAVIL) 100 MG tablet Take 100 mg by mouth at bedtime.      Marland Kitchen amLODipine-valsartan (EXFORGE) 5-160 MG per tablet Take 1 tablet by mouth 2 (two) times daily.      Marland Kitchen BIOTIN PO Take 10,000 mg by mouth daily.      . clidinium-chlordiazePOXIDE (  LIBRAX) 2.5-5 MG per capsule Take 1 capsule by mouth 3 (three) times daily as needed. For stomach      . diphenhydrAMINE (BENADRYL) 25 MG tablet Take 25 mg by mouth 2 (two) times daily.      Marland Kitchen docusate sodium (COLACE) 100 MG capsule Take 1 capsule (100 mg total) by mouth 2 (two) times daily.  10 capsule  0  . DULoxetine (CYMBALTA) 60 MG capsule Take 60 mg by mouth daily.      Marland Kitchen glimepiride (AMARYL) 4 MG tablet Take 4 mg by mouth daily before breakfast.      . HYDROcodone-acetaminophen (LORTAB) 10-500 MG  per tablet Take 1 tablet by mouth every 6 (six) hours as needed. For pain      . metaxalone (SKELAXIN) 800 MG tablet Take 800 mg by mouth 3 (three) times daily as needed. For muscle spams      . metFORMIN (GLUCOPHAGE) 500 MG tablet Take 500 mg by mouth every evening.       . metoCLOPramide (REGLAN) 10 MG tablet Take 10 mg by mouth 4 (four) times daily.      Marland Kitchen MILK THISTLE PO Take 1,000 mg by mouth daily.      . Multiple Vitamin (MULTIVITAMIN) capsule Take 1 capsule by mouth daily.      . Niacin-Simvastatin [facility]) 500-40 MG TB24 Take 1 tablet by mouth daily.       . pantoprazole (PROTONIX) 40 MG tablet Take 1 tablet (40 mg total) by mouth 2 (two) times daily.  60 tablet  1  . pioglitazone (ACTOS) 15 MG tablet Take 15 mg by mouth daily.      . promethazine (PHENERGAN) 25 MG tablet Take 25 mg by mouth every 6 (six) hours as needed. For nausea      . sucralfate (CARAFATE) 1 G tablet Take 1 g by mouth 4 (four) times daily.      Marland Kitchen topiramate (TOPAMAX) 100 MG tablet Take 100 mg by mouth 2 (two) times daily.      . vitamin B-12 (CYANOCOBALAMIN) 1000 MCG tablet Take 1,000 mcg by mouth 2 (two) times daily.      . vitamin E 400 UNIT capsule Take 400 Units by mouth 2 (two) times daily.      Marland Kitchen dicyclomine (BENTYL) 10 MG capsule Take 1 capsule 2-3 times daily before a meal.  120 capsule  0   No current facility-administered medications for this visit.   Facility-Administered Medications Ordered in Other Visits  Medication Dose Route Frequency Provider Last Rate Last Dose  . sodium chloride irrigation 0.9 %    PRN Fabio Bering, MD   1,000 mL at 11/20/11 0815    Allergies as of 01/14/2012 - Review Complete 01/14/2012  Allergen Reaction Noted  . Ketorolac tromethamine  10/27/2011  . Sulfamethoxazole Rash 10/27/2011  . Tetracyclines & related Rash 10/27/2011    Family History  Problem Relation Age of Onset  . Cancer Father 24    Gallbladder  . Anesthesia problems Neg Hx   . Hypotension Neg Hx     . Malignant hyperthermia Neg Hx   . Pseudochol deficiency Neg Hx     History   Social History  . Marital Status: Single    Spouse Name: N/A    Number of Children: 1  . Years of Education: N/A   Occupational History  . disabled    Social History Main Topics  . Smoking status: Current Everyday Smoker -- 1.0 packs/day for 42 years  Types: Cigarettes  . Smokeless tobacco: None  . Alcohol Use: No     last etoh 2003, daily up until then  . Drug Use: No  . Sexually Active: None   Other Topics Concern  . None   Social History Narrative   Divorced, moved to [facility] November 2012    Review of Systems: Gen: Denies fever, chills, anorexia. Denies fatigue, weakness, weight loss.  CV: Denies chest pain, palpitations, syncope, peripheral edema, and claudication. Resp: Denies dyspnea at rest, cough, wheezing, coughing up blood, and pleurisy. GI: SEE HPI Derm: Denies rash, itching, dry skin Psych: Denies depression, anxiety, memory loss, confusion. No homicidal or suicidal ideation.  Heme: Denies bruising, bleeding, and enlarged lymph nodes.  Physical Exam: BP 130/78  Pulse 84  Temp(Src) 97.6 F (36.4 C) (Temporal)  Ht 6' (1.829 m)  Wt 219 lb 6.4 oz (99.519 kg)  BMI 29.76 kg/m2 General:   Alert and oriented. No distress noted. Pleasant and cooperative.  Head:  Normocephalic and atraumatic. Eyes:  Conjuctiva clear without scleral icterus. Mouth:  Oral mucosa pink and moist. Good dentition. No lesions. Neck:  Supple, without mass or thyromegaly. Heart:  S1, S2 present without murmurs, rubs, or gallops. Regular rate and rhythm. Abdomen:  +BS, soft, TTP LUQ, LLQ and non-distended. No rebound or guarding. No HSM or masses noted. +CARNETT'S SIGN Msk:  Symmetrical without gross deformities. Normal posture. Extremities:  Without edema. Neurologic:  Alert and  oriented x4;  grossly normal neurologically. Skin:  Intact without significant lesions or rashes. Cervical Nodes:  No  significant cervical adenopathy. Psych:  Alert and cooperative. Normal mood and affect.

## 2012-01-14 NOTE — Assessment & Plan Note (Signed)
+  Carnett's sign on exam. Recent CT benign. Acute on chronic LUQ pain, worsening. Exacerbated by eating. Notes from prior OV mention hx of H.pylori in past. Last EGD at outside facility as previously mentioned. Increase Protonix to BID, proceed with EGD as planned. Pt may ultimately benefit from pain management referral. REQUESTED NARCOTICS at visit, states PCP will not refill hydrocodone.

## 2012-01-14 NOTE — Assessment & Plan Note (Signed)
Likely component of abdominal pain. Hx IBS. Recent negative celiac panel. Trial of Bentyl. NO NARCOTICS prescribed despite pt's request.   Next TCS due 2016, surveillance.

## 2012-01-14 NOTE — Patient Instructions (Signed)
Increase Protonix to twice a day, 30 minutes before breakfast and dinner.   Eat a high fiber diet, you may supplement with Metamucil daily.   I have sent a prescription to "trial" Bentyl, due to your history of irritable bowel syndrome. You may take this twice a day with meals or up to three times a day. This is starting at a lower dose so we may see how you do. Watch for dry mouth, constipation, blurred vision. Contact us if you notice this.   We have set you up for an upper endoscopy with possible dilation if needed with Dr. Jena Gauss in the near future. In the meantime, chew food well, take small bites, follow soft foods.

## 2012-01-14 NOTE — Assessment & Plan Note (Addendum)
56 year old male with esophageal dysphagia, +pill and food. Difficult historian at this point, states issues with "throat tickling" since cholecystectomy a few months ago. Although intubation may have caused some vague symptoms post-operatively, should not still be having issues. He does not a remote hx of dilation in the past. Last EGD at outside facility Jan 2012. Coupled with his complaints of LUQ pain, worsened with eating, will proceed with EGD and dilation. HOWEVER, pt does have hx of chronic abdominal pain, +Carnett's sign on exam. Question uncontrolled reflux causing intermittent dysphagia but unable to rule out other etiologies.   ~Proceed with upper endoscopy and dilation in the near future with Dr. Jena Gauss. The risks, benefits, and alternatives have been discussed in detail with patient. They have stated understanding and desire to proceed.  ~WILL NEED PHENERGAN 25 MG ON CALL ~Increase Protonix to BID for now

## 2012-01-21 ENCOUNTER — Encounter (HOSPITAL_COMMUNITY)
Admission: RE | Disposition: A | Discharge status: Home / Self Care | Payer: Self-pay | Source: Ambulatory Visit | Attending: Internal Medicine

## 2012-01-21 ENCOUNTER — Ambulatory Visit: Admit: 2012-01-21 | Payer: Self-pay | Admitting: Internal Medicine

## 2012-01-21 ENCOUNTER — Ambulatory Visit (HOSPITAL_COMMUNITY)
Admission: RE | Admit: 2012-01-21 | Discharge: 2012-01-21 | Disposition: A | Discharge status: Home / Self Care | Payer: Medicaid Other | Source: Ambulatory Visit | Attending: Internal Medicine | Admitting: Internal Medicine

## 2012-01-21 ENCOUNTER — Encounter (HOSPITAL_COMMUNITY): Payer: Self-pay | Admitting: *Deleted

## 2012-01-21 DIAGNOSIS — Z01812 Encounter for preprocedural laboratory examination: Secondary | ICD-10-CM | POA: Insufficient documentation

## 2012-01-21 DIAGNOSIS — K2960 Other gastritis without bleeding: Secondary | ICD-10-CM

## 2012-01-21 DIAGNOSIS — I10 Essential (primary) hypertension: Secondary | ICD-10-CM | POA: Insufficient documentation

## 2012-01-21 DIAGNOSIS — E119 Type 2 diabetes mellitus without complications: Secondary | ICD-10-CM | POA: Insufficient documentation

## 2012-01-21 DIAGNOSIS — R1310 Dysphagia, unspecified: Secondary | ICD-10-CM | POA: Insufficient documentation

## 2012-01-21 DIAGNOSIS — R109 Unspecified abdominal pain: Secondary | ICD-10-CM

## 2012-01-21 DIAGNOSIS — Z79899 Other long term (current) drug therapy: Secondary | ICD-10-CM | POA: Insufficient documentation

## 2012-01-21 DIAGNOSIS — K449 Diaphragmatic hernia without obstruction or gangrene: Secondary | ICD-10-CM

## 2012-01-21 DIAGNOSIS — K2970 Gastritis, unspecified, without bleeding: Secondary | ICD-10-CM | POA: Insufficient documentation

## 2012-01-21 DIAGNOSIS — E7800 Pure hypercholesterolemia, unspecified: Secondary | ICD-10-CM | POA: Insufficient documentation

## 2012-01-21 HISTORY — PX: ESOPHAGOGASTRODUODENOSCOPY: SHX1529

## 2012-01-21 LAB — GLUCOSE, CAPILLARY: Glucose-Capillary: 99 mg/dL (ref 70–99)

## 2012-01-21 SURGERY — ESOPHAGOGASTRODUODENOSCOPY (EGD) WITH ESOPHAGEAL DILATION
Anesthesia: Moderate Sedation

## 2012-01-21 MED ORDER — MIDAZOLAM HCL 5 MG/5ML IJ SOLN
INTRAMUSCULAR | Status: AC
  Filled 2012-01-21: qty 10

## 2012-01-21 MED ORDER — BUTAMBEN-TETRACAINE-BENZOCAINE 2-2-14 % EX AERO
INHALATION_SPRAY | CUTANEOUS | Status: DC | PRN
  Administered 2012-01-21: 2 via TOPICAL

## 2012-01-21 MED ORDER — PROMETHAZINE HCL 25 MG/ML IJ SOLN
25.0000 mg | Freq: Once | INTRAMUSCULAR | Status: AC
  Administered 2012-01-21: 25 mg via INTRAVENOUS

## 2012-01-21 MED ORDER — SODIUM CHLORIDE 0.9 % IJ SOLN
INTRAMUSCULAR | Status: AC
  Filled 2012-01-21: qty 10

## 2012-01-21 MED ORDER — SODIUM CHLORIDE 0.45 % IV SOLN
INTRAVENOUS | Status: DC
  Administered 2012-01-21: 07:00:00 via INTRAVENOUS

## 2012-01-21 MED ORDER — MEPERIDINE HCL 100 MG/ML IJ SOLN
INTRAMUSCULAR | Status: AC
  Filled 2012-01-21: qty 2

## 2012-01-21 MED ORDER — STERILE WATER FOR IRRIGATION IR SOLN
Status: DC | PRN
  Administered 2012-01-21: 08:00:00

## 2012-01-21 MED ORDER — PROMETHAZINE HCL 25 MG/ML IJ SOLN
INTRAMUSCULAR | Status: AC
  Filled 2012-01-21: qty 1

## 2012-01-21 MED ORDER — MIDAZOLAM HCL 5 MG/5ML IJ SOLN
INTRAMUSCULAR | Status: DC | PRN
  Administered 2012-01-21 (×2): 1 mg via INTRAVENOUS
  Administered 2012-01-21: 2 mg via INTRAVENOUS

## 2012-01-21 MED ORDER — MEPERIDINE HCL 100 MG/ML IJ SOLN
INTRAMUSCULAR | Status: DC | PRN
  Administered 2012-01-21: 25 mg via INTRAVENOUS
  Administered 2012-01-21: 50 mg via INTRAVENOUS

## 2012-01-21 NOTE — Interval H&P Note (Signed)
History and Physical Interval Note:  01/21/2012 7:45 AM  Brian [name]  has presented today for surgery, with the diagnosis of dysphagia and GERD  The various methods of treatment have been discussed with the patient and family. After consideration of risks, benefits and other options for treatment, the patient has consented to  Procedure(s) (LRB): ESOPHAGOGASTRODUODENOSCOPY (EGD) WITH ESOPHAGEAL DILATION (N/A) as a surgical intervention .  The patients' history has been reviewed, patient examined, no change in status, stable for surgery.  I have reviewed the patients' chart and labs.  Questions were answered to the patient's satisfaction.     [name]  Patient states per tonics works better and [employer]. Intermittent esophageal dysphagia. Intermittent postprandial left-sided abdominal pain. Otherwise negative thus far EGD now being done

## 2012-01-21 NOTE — H&P (View-Only) (Signed)
Referring Provider: Holder, Latia, DO Primary Care Physician:  Holder, Latia, DO, DO Primary Gastroenterologist: Dr. Rourk   Chief Complaint  Patient presents with  . Dysphagia    HPI:   Brian [name] presents today with hx of fatty liver, GERD, IBS. Actually just seen one month ago in our office, with noted multiple GI issues to include chronic left-sided abdominal pain, hoarseness, possible gastroparesis on Reglan. Started on Dexilant, as he has had a better response to this in the past, with recommendations of 10 lbs wt loss over next 6 mos and to return ifobt. Celiac panel negative. I do not see where he submitted an ifobt.   Returns today with reports of dysphagia. States has tickling in throat since chole. Difficulty with swallowing since last Friday. +pill dysphagia, solid food dysphagia. "irritating". +odynophagia. Feels like he has a knot in his throat, "on his vocal cords". Tried Dexilant but felt like esophagus was sore. Returned to Protonix for about a week. Believes some type of dilation in past.   Friday LUQ pain, severe, sent to ED by Dr. Ziegler, CT negative for diverticulitis. States LUQ pain worse with eating. No melena. Abdominal pain is chronic.   Has postprandial stools, baseline for pt. Asking for refill on hydrocodone. States his PCP will not refill this.   He has had 3 CT scans since July 2011.  Most recent Jan 05, 2012:  1. Overall stable examination. No acute abdominal pelvic findings  identified. Specifically, no evidence of diverticulitis.  2. No evidence of appendicitis.  3. Stable low-density renal lesions bilaterally consistent with  cysts.   Past Medical History  Diagnosis Date  . DM (diabetes mellitus)   . HTN (hypertension)   . IBS (irritable bowel syndrome)   . GERD (gastroesophageal reflux disease)   . High cholesterol   . Chronic abdominal pain 08/11/11    Reported per patient, Dr. Bianchi in [place]  . Adenomatous colon polyp     due for  surveillance 2016 per epic notes, performed at outside facility   . Seizures     1 seizure with severe back pain. no further meds or seizures  . Headache     migraines    Past Surgical History  Procedure Date  . Tonsillectomy   . Arm surgery     tendon/left  . Esophagogastroduodenoscopy 09/02/10    [facility], Dr. 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 pylori  . Esophagogastroduodenoscopy 02/03/2006    Dr. Penn White-> hiatal hernia, atrial erosions  . Cholecystectomy 11/20/2011    Procedure: LAPAROSCOPIC CHOLECYSTECTOMY;  Surgeon: Brent C Ziegler, MD;  Location: [facility];  Service: General;  Laterality: N/A;  . Liver biopsy 11/20/2011    Procedure: LIVER BIOPSY;  Surgeon: Brent C Ziegler, MD;  Location: [facility];  Service: General;  Laterality: N/A;    Current Outpatient Prescriptions  Medication Sig Dispense Refill  . ACETAMINOPHEN-BUTALBITAL (BUPAP) 50-650 MG TABS Take 1 tablet by mouth every 6 (six) hours as needed. For headaches      . amitriptyline (ELAVIL) 100 MG tablet Take 100 mg by mouth at bedtime.      . amLODipine-valsartan (EXFORGE) 5-160 MG per tablet Take 1 tablet by mouth 2 (two) times daily.      . BIOTIN PO Take 10,000 mg by mouth daily.      . clidinium-chlordiazePOXIDE (  LIBRAX) 2.5-5 MG per capsule Take 1 capsule by mouth 3 (three) times daily as needed. For stomach      . diphenhydrAMINE (BENADRYL) 25 MG tablet Take 25 mg by mouth 2 (two) times daily.      . docusate sodium (COLACE) 100 MG capsule Take 1 capsule (100 mg total) by mouth 2 (two) times daily.  10 capsule  0  . DULoxetine (CYMBALTA) 60 MG capsule Take 60 mg by mouth daily.      . glimepiride (AMARYL) 4 MG tablet Take 4 mg by mouth daily before breakfast.      . HYDROcodone-acetaminophen (LORTAB) 10-500 MG  per tablet Take 1 tablet by mouth every 6 (six) hours as needed. For pain      . metaxalone (SKELAXIN) 800 MG tablet Take 800 mg by mouth 3 (three) times daily as needed. For muscle spams      . metFORMIN (GLUCOPHAGE) 500 MG tablet Take 500 mg by mouth every evening.       . metoCLOPramide (REGLAN) 10 MG tablet Take 10 mg by mouth 4 (four) times daily.      . MILK THISTLE PO Take 1,000 mg by mouth daily.      . Multiple Vitamin (MULTIVITAMIN) capsule Take 1 capsule by mouth daily.      . Niacin-Simvastatin (SIMCOR) 500-40 MG TB24 Take 1 tablet by mouth daily.       . pantoprazole (PROTONIX) 40 MG tablet Take 1 tablet (40 mg total) by mouth 2 (two) times daily.  60 tablet  1  . pioglitazone (ACTOS) 15 MG tablet Take 15 mg by mouth daily.      . promethazine (PHENERGAN) 25 MG tablet Take 25 mg by mouth every 6 (six) hours as needed. For nausea      . sucralfate (CARAFATE) 1 G tablet Take 1 g by mouth 4 (four) times daily.      . topiramate (TOPAMAX) 100 MG tablet Take 100 mg by mouth 2 (two) times daily.      . vitamin B-12 (CYANOCOBALAMIN) 1000 MCG tablet Take 1,000 mcg by mouth 2 (two) times daily.      . vitamin E 400 UNIT capsule Take 400 Units by mouth 2 (two) times daily.      . dicyclomine (BENTYL) 10 MG capsule Take 1 capsule 2-3 times daily before a meal.  120 capsule  0   No current facility-administered medications for this visit.   Facility-Administered Medications Ordered in Other Visits  Medication Dose Route Frequency Provider Last Rate Last Dose  . sodium chloride irrigation 0.9 %    PRN Brent C Ziegler, MD   1,000 mL at 11/20/11 0815    Allergies as of 01/14/2012 - Review Complete 01/14/2012  Allergen Reaction Noted  . Ketorolac tromethamine  10/27/2011  . Sulfamethoxazole Rash 10/27/2011  . Tetracyclines & related Rash 10/27/2011    Family History  Problem Relation Age of Onset  . Cancer Father 60    Gallbladder  . Anesthesia problems Neg Hx   . Hypotension Neg Hx     . Malignant hyperthermia Neg Hx   . Pseudochol deficiency Neg Hx     History   Social History  . Marital Status: Single    Spouse Name: N/A    Number of Children: 1  . Years of Education: N/A   Occupational History  . disabled    Social History Main Topics  . Smoking status: Current Everyday Smoker -- 1.0 packs/day for 42 years      Types: Cigarettes  . Smokeless tobacco: None  . Alcohol Use: No     last etoh 2003, daily up until then  . Drug Use: No  . Sexually Active: None   Other Topics Concern  . None   Social History Narrative   Divorced, moved to [place] November 2012    Review of Systems: Gen: Denies fever, chills, anorexia. Denies fatigue, weakness, weight loss.  CV: Denies chest pain, palpitations, syncope, peripheral edema, and claudication. Resp: Denies dyspnea at rest, cough, wheezing, coughing up blood, and pleurisy. GI: SEE HPI Derm: Denies rash, itching, dry skin Psych: Denies depression, anxiety, memory loss, confusion. No homicidal or suicidal ideation.  Heme: Denies bruising, bleeding, and enlarged lymph nodes.  Physical Exam: BP 130/78  Pulse 84  Temp(Src) 97.6 F (36.4 C) (Temporal)  Ht 6' (1.829 m)  Wt 219 lb 6.4 oz (99.519 kg)  BMI 29.76 kg/m2 General:   Alert and oriented. No distress noted. Pleasant and cooperative.  Head:  Normocephalic and atraumatic. Eyes:  Conjuctiva clear without scleral icterus. Mouth:  Oral mucosa pink and moist. Good dentition. No lesions. Neck:  Supple, without mass or thyromegaly. Heart:  S1, S2 present without murmurs, rubs, or gallops. Regular rate and rhythm. Abdomen:  +BS, soft, TTP LUQ, LLQ and non-distended. No rebound or guarding. No HSM or masses noted. +CARNETT'S SIGN Msk:  Symmetrical without gross deformities. Normal posture. Extremities:  Without edema. Neurologic:  Alert and  oriented x4;  grossly normal neurologically. Skin:  Intact without significant lesions or rashes. Cervical Nodes:  No  significant cervical adenopathy. Psych:  Alert and cooperative. Normal mood and affect.  

## 2012-01-21 NOTE — Op Note (Signed)
[facility] [address] [place], [place]  [address]  ENDOSCOPY PROCEDURE REPORT  PATIENT:  Brian [name], Brian [name]  MR#:  [id number] BIRTHDATE:  12/18/1955, 55 yrs. old  GENDER:  male  ENDOSCOPIST:  R. Roetta Sessions, MD [facility] Referred by:         Dr. Denita Lung  PROCEDURE DATE:  01/21/2012 PROCEDURE:  EGD with Elease Hashimoto dilation gastric biopsy  INDICATIONS:   esophageal dysphagia. Abdominal pain  INFORMED CONSENT:   The risks, benefits, limitations, alternatives and imponderables have been discussed.  The potential for biopsy, esophogeal dilation, etc. have also been reviewed.  Questions have been answered.  All parties agreeable.  Please see the history and physical in the medical record for more information.  MEDICATIONS:   Versed 4 mg IV and Demerol 75 mg IV in divided doses.. Cetacaine spray. Phenergan 25 mg IV to augment conscious sedation  DESCRIPTION OF PROCEDURE:   The XB-1478G ([id number]) endoscope was introduced through the mouth and advanced to the second portion of the duodenum without difficulty or limitations.  The mucosal surfaces were surveyed very carefully during advancement of the scope and upon withdrawal.  Retroflexion view of the proximal stomach and esophagogastric junction was performed.  <<PROCEDUREIMAGES>>  FINDINGS:  Cystic lesion on the right arytenoid cartilage "ball valving "a little just above the vocal cords. See image 6. Noncritical Schatzki's ring. Tiny distal esophageal erosions. No Barrett's esophagus. Stomach empty. Patchy gastric erosions and erythema. No ulcer or infiltrating process. Small hiatal hernia. Pylorus patent. The first and second portion of the duodenum appeared normal.  THERAPEUTIC / DIAGNOSTIC MANEUVERS PERFORMED:   73 French Maloney dilator passed to full insertion easily. A look back revealed no complication related to this maneuver. Subsequently, biopsies of gastric antrum and body were taken for histologic  study. 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 Hiatal hernia. None critical. Schatzki's ring status post dilation as described above. I will hernia. Gastric erosions and erythema of uncertain significance-status post biopsy  RECOMMENDATIONS:   Continue Protonix 40 mg orally twice a day. ENT referral with Dr. Christain Sacramento.  Followup on pathology.  ______________________________ R. Roetta Sessions, MD [facility]  CC:  n. eSIGNED:   R. Roetta Sessions at 01/21/2012 08:29 AM  [name], [phone]

## 2012-01-21 NOTE — Discharge Instructions (Addendum)
EGD Discharge instructions Please read the instructions outlined below and refer to this sheet in the next few weeks. These discharge instructions provide you with general information on caring for yourself after you leave the hospital. Your doctor may also give you specific instructions. While your treatment has been planned according to the most current medical practices available, unavoidable complications occasionally occur. If you have any problems or questions after discharge, please call your doctor. ACTIVITY  You may resume your regular activity but move at a slower pace for the next 24 hours.   Take frequent rest periods for the next 24 hours.   Walking will help expel (get rid of) the air and reduce the bloated feeling in your abdomen.   No driving for 24 hours (because of the anesthesia (medicine) used during the test).   You may shower.   Do not sign any important legal documents or operate any machinery for 24 hours (because of the anesthesia used during the test).  NUTRITION  Drink plenty of fluids.   You may resume your normal diet.   Begin with a light meal and progress to your normal diet.   Avoid alcoholic beverages for 24 hours or as instructed by your caregiver.  MEDICATIONS  You may resume your normal medications unless your caregiver tells you otherwise.  WHAT YOU CAN EXPECT TODAY  You may experience abdominal discomfort such as a feeling of fullness or "gas" pains.  FOLLOW-UP  Your doctor will discuss the results of your test with you.  SEEK IMMEDIATE MEDICAL ATTENTION IF ANY OF THE FOLLOWING OCCUR:  Excessive nausea (feeling sick to your stomach) and/or vomiting.   Severe abdominal pain and distention (swelling).   Trouble swallowing.   Temperature over 101 F (37.8 C).   Rectal bleeding or vomiting of blood.    Appointment see Dr. Christain Sacramento  In reference to cyst off the arytenoid cartilage  Continue protonix twice daily  Further recommendations  to follow pending review of pathology report Esophagogastroduodenoscopy This is an endoscopic procedure (a procedure that uses a device like a flexible telescope) that allows your caregiver to view the upper stomach and small bowel. This test allows your caregiver to look at the esophagus. The esophagus carries food from your mouth to your stomach. They can also look at your duodenum. This is the first part of the small intestine that attaches to the stomach. This test is used to detect problems in the bowel such as ulcers and inflammation. PREPARATION FOR TEST Nothing to eat after midnight the day before the test. NORMAL FINDINGS Normal esophagus, stomach, and duodenum. Ranges for normal findings may vary among different laboratories and hospitals. You should always check with your doctor after having lab work or other tests done to discuss the meaning of your test results and whether your values are considered within normal limits. MEANING OF TEST  Your caregiver will go over the test results with you and discuss the importance and meaning of your results, as well as treatment options and the need for additional tests if necessary. OBTAINING THE TEST RESULTS It is your responsibility to obtain your test results. Ask the lab or department performing the test when and how you will get your results. Document Released: 12/18/2004 Document Revised: 08/06/2011 Document Reviewed: 07/27/2008 [facility] Patient Information 2012 [place], [place].

## 2012-01-27 ENCOUNTER — Telehealth: Payer: Self-pay | Admitting: Gastroenterology

## 2012-01-27 NOTE — Telephone Encounter (Signed)
Pt called about his referral to Dr Seward Meth (ENT) he stated they can not see him until 07/11 @ 1pm and that the [facility] office does not take Medicaid- Pt stated he could not wait that [place] and asked if we could refer him somewhere else- I called Dr Chaney Malling office and spoke to Iona- she stated they did not have anything before 07/11 but pt was on a waiting list- I then called [facility] but since the patient has Medicaid the referral would have to come from the PCP- I called [facility], spoke with Consuella Lose, the referral coordinator- she will take care of referring the pt to an ENT and will contact pt with appt- Notes faxed to her at [phone]- called pt and made him aware of this plan- he is in agreement.

## 2012-01-30 ENCOUNTER — Encounter: Payer: Self-pay | Admitting: Internal Medicine

## 2012-02-09 ENCOUNTER — Telehealth: Payer: Self-pay

## 2012-02-09 NOTE — Telephone Encounter (Signed)
Pt said his Protonix was increased to BID.

## 2012-02-09 NOTE — Telephone Encounter (Signed)
He said that [facility] ) had sent fax to [place]. He needs PA on Protonix for bid dosing. I checked and we have not received anything. I called back and spoke to his mother and she will have him call [facility] and have them refax the request.

## 2012-02-11 NOTE — Telephone Encounter (Signed)
Per Raynelle Fanning, this has been taken care of.

## 2012-02-17 NOTE — Telephone Encounter (Signed)
Open in error

## 2012-02-25 ENCOUNTER — Other Ambulatory Visit: Payer: Self-pay

## 2012-02-25 MED ORDER — DICYCLOMINE HCL 10 MG PO CAPS: ORAL_CAPSULE | ORAL | Status: DC

## 2012-03-01 ENCOUNTER — Other Ambulatory Visit: Payer: Self-pay

## 2012-03-01 NOTE — Telephone Encounter (Signed)
6/27 RF done by [facility]

## 2012-04-07 ENCOUNTER — Other Ambulatory Visit: Payer: Self-pay

## 2012-04-07 MED ORDER — PANTOPRAZOLE SODIUM 40 MG PO TBEC: 40.0000 mg | DELAYED_RELEASE_TABLET | Freq: Two times a day (BID) | ORAL | Status: DC

## 2012-10-20 ENCOUNTER — Encounter: Payer: Self-pay | Admitting: Internal Medicine

## 2012-10-24 ENCOUNTER — Encounter: Payer: Self-pay | Admitting: Gastroenterology

## 2012-10-24 ENCOUNTER — Ambulatory Visit (INDEPENDENT_AMBULATORY_CARE_PROVIDER_SITE_OTHER): Payer: Medicare HMO | Admitting: Gastroenterology

## 2012-10-24 VITALS — BP 165/92 | HR 87 | Temp 97.4°F | Ht 72.0 in | Wt 220.8 lb

## 2012-10-24 DIAGNOSIS — G8929 Other chronic pain: Secondary | ICD-10-CM

## 2012-10-24 DIAGNOSIS — R109 Unspecified abdominal pain: Secondary | ICD-10-CM

## 2012-10-24 DIAGNOSIS — K219 Gastro-esophageal reflux disease without esophagitis: Secondary | ICD-10-CM

## 2012-10-24 DIAGNOSIS — R1310 Dysphagia, unspecified: Secondary | ICD-10-CM

## 2012-10-24 MED ORDER — ONDANSETRON HCL 4 MG PO TABS: 4.0000 mg | ORAL_TABLET | Freq: Two times a day (BID) | ORAL | Status: DC

## 2012-10-24 NOTE — Progress Notes (Signed)
Referring Provider: Denita Lung, DO Primary Care Physician:  Margorie John, MD Primary Gastroenterologist: Dr. Jena Gauss   Chief Complaint  Brian [name] presents with  . Abdominal Pain    HPI:   57 year old male returning today with history of fatty liver, GERD, chronic diarrhea, abdominal pain. EGD performed due to dysphagia and dyspepsia May 2013 by Dr. Jena Gauss noted cystic lesion at arytenoid cartilage on the right, non-critical Schatzki's ring s/p dilation, gastric erosions with negative H.pylori. He was referred to ENT, with subsequent cystic lesion removal. ENT was in [place], but insurance has now changed.  Unable to see him again. Diarrhea continues intermittently; up until 2 weeks ago was pure water. Now solid. BM now once to twice a day. Last colonoscopy in [place], unsure exact date. However, he believes it was 2010 or so. Moved here in 2011. No rectal bleeding. No wt loss, lack of appetite. Has hard time eating in mornings when he gets up. Usually sick to his stomach in the mornings. +nausea, gets better throughout the day. Wakes up nauseated. One episode of vomiting in last week. Not taking Bentyl, wasn't helping much. Protonix BID. Phenergan prn. Sometimes feels like if he could pass gas, he would feel better. Pain worsened with exertion, sometimes eating.    Past Medical History  Diagnosis Date  . DM (diabetes mellitus)   . HTN (hypertension)   . IBS (irritable bowel syndrome)   . GERD (gastroesophageal reflux disease)   . High cholesterol   . Chronic abdominal pain 08/11/11    Reported per Brian [name], Dr. Ralene Cork in [place]  . Adenomatous colon polyp     due for surveillance 2016 per epic notes, performed at outside facility   . Seizures     1 seizure with severe back pain. no further meds or seizures  . Headache     migraines    Past Surgical History  Procedure Laterality Date  . Tonsillectomy    . Arm surgery      tendon/left  . Esophagogastroduodenoscopy  09/02/10   [facility], Dr. 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 pylori  . Esophagogastroduodenoscopy  02/03/2006    Dr. Steele Berg White-> hiatal hernia, atrial erosions  . Cholecystectomy  11/20/2011    Procedure: LAPAROSCOPIC CHOLECYSTECTOMY;  Surgeon: Fabio Bering, MD;  Location: [facility];  Service: General;  Laterality: N/A;  . Liver biopsy  11/20/2011    Procedure: LIVER BIOPSY;  Surgeon: Fabio Bering, MD;  Location: [facility];  Service: General;  Laterality: N/A;  . Esophagogastroduodenoscopy  01/21/2012    ZOX:[id number] lesion at arytenoid cartilage on the right-likely explains some of his oro- pharyngeal symptoms/Hiatal hernia/Schatzki's ring s/p dilation, gastric erosions without H.pylori  . Larynx surgery      cyst removed, ENT [place]    Current Outpatient Prescriptions  Medication Sig Dispense Refill  . ACETAMINOPHEN-BUTALBITAL (BUPAP) 50-650 MG TABS Take 1 tablet by mouth every 6 (six) hours as needed. For headaches      . amitriptyline (ELAVIL) 100 MG tablet Take 100 mg by mouth at bedtime.      Marland Kitchen amLODipine-valsartan (EXFORGE) 5-160 MG per tablet Take 1 tablet by mouth 2 (two) times daily.      Marland Kitchen BIOTIN PO Take 10,000 mg by mouth daily.      . clidinium-chlordiazePOXIDE (LIBRAX) 2.5-5 MG per capsule Take 1 capsule by  mouth 3 (three) times daily as needed. For stomach      . cyclobenzaprine (FLEXERIL) 5 MG tablet Take 5 mg by mouth 3 (three) times daily as needed for muscle spasms.      . diphenhydrAMINE (BENADRYL) 25 MG tablet Take 25 mg by mouth 2 (two) times daily.      . DULoxetine (CYMBALTA) 60 MG capsule Take 60 mg by mouth daily.      Marland Kitchen glimepiride (AMARYL) 4 MG tablet Take 4 mg by mouth daily before breakfast.      . HYDROcodone-acetaminophen (LORTAB) 10-500 MG per tablet Take 1 tablet by  mouth every 6 (six) hours as needed. For pain      . metaxalone (SKELAXIN) 800 MG tablet Take 800 mg by mouth 3 (three) times daily as needed. For muscle spams      . metFORMIN (GLUCOPHAGE) 500 MG tablet Take 500 mg by mouth every evening.       . metoCLOPramide (REGLAN) 10 MG tablet Take 10 mg by mouth 4 (four) times daily.      Marland Kitchen MILK THISTLE PO Take 1,000 mg by mouth daily.      . Multiple Vitamin (MULTIVITAMIN) capsule Take 1 capsule by mouth daily.      . Niacin-Simvastatin [facility]) 500-40 MG TB24 Take 1 tablet by mouth daily.       . pantoprazole (PROTONIX) 40 MG tablet Take 1 tablet (40 mg total) by mouth 2 (two) times daily.  60 tablet  5  . pioglitazone (ACTOS) 15 MG tablet Take 15 mg by mouth daily.      . promethazine (PHENERGAN) 25 MG tablet Take 25 mg by mouth every 6 (six) hours as needed. For nausea      . sucralfate (CARAFATE) 1 G tablet Take 1 g by mouth 4 (four) times daily.      Marland Kitchen topiramate (TOPAMAX) 100 MG tablet Take 100 mg by mouth 2 (two) times daily.      . traMADol (ULTRAM) 50 MG tablet Take 50 mg by mouth every 6 (six) hours as needed for pain.      . vitamin B-12 (CYANOCOBALAMIN) 1000 MCG tablet Take 1,000 mcg by mouth 2 (two) times daily.      . vitamin E 400 UNIT capsule Take 400 Units by mouth 2 (two) times daily.      Marland Kitchen dicyclomine (BENTYL) 10 MG capsule Take 1 capsule 2-3 times daily before a meal.  120 capsule  3  . ondansetron (ZOFRAN) 4 MG tablet Take 1 tablet (4 mg total) by mouth 2 (two) times daily.  60 tablet  3   No current facility-administered medications for this visit.   Facility-Administered Medications Ordered in Other Visits  Medication Dose Route Frequency Provider Last Rate Last Dose  . sodium chloride irrigation 0.9 %    PRN Fabio Bering, MD   1,000 mL at 11/20/11 0815    Allergies as of 10/24/2012 - Review Complete 10/24/2012  Allergen Reaction Noted  . Ketorolac tromethamine  10/27/2011  . Sulfamethoxazole Rash 10/27/2011  .  Tetracyclines & related Rash 10/27/2011    Family History  Problem Relation Age of Onset  . Cancer Father 49    Gallbladder  . Anesthesia problems Neg Hx   . Hypotension Neg Hx   . Malignant hyperthermia Neg Hx   . Pseudochol deficiency Neg Hx     History   Social History  . Marital Status: Single    Spouse Name: N/A  Number of Children: 1  . Years of Education: N/A   Occupational History  . disabled    Social History Main Topics  . Smoking status: Current Every Day Smoker -- 1.00 packs/day for 42 years    Types: Cigarettes  . Smokeless tobacco: None  . Alcohol Use: No     Comment: last etoh 2003, daily up until then  . Drug Use: No  . Sexually Active: None   Other Topics Concern  . None   Social History Narrative   Divorced, moved to [place] [facility] [place] November 2012    Review of Systems: Negative unless mentioned in HPI.   Physical Exam: BP 165/92  Pulse 87  Temp(Src) 97.4 F (36.3 C) (Oral)  Ht 6' (1.829 m)  Wt 220 lb 12.8 oz (100.154 kg)  BMI 29.94 kg/m2 General:   Alert and oriented. No distress noted. Pleasant and cooperative.  Head:  Normocephalic and atraumatic. Eyes:  Conjuctiva clear without scleral icterus. Heart:  S1, S2 present without murmurs, rubs, or gallops. Regular rate and rhythm. Abdomen:  +BS, soft, TTP with only LIGHT touch, and non-distended. No rebound or guarding. No HSM or masses noted. Negative Carnett's.  Msk:  Symmetrical without gross deformities. Normal posture. Extremities:  Without edema. Neurologic:  Alert and  oriented x4;  grossly normal neurologically. Skin:  Intact without significant lesions or rashes. Psych:  Alert and cooperative. Normal mood and affect.

## 2012-10-24 NOTE — Patient Instructions (Addendum)
Continue to take Protonix twice a day.  I have added Zofran, a nausea medicine. Take this in the morning scheduled. You may take it twice a day if needed. Follow the gastroparesis diet.  Please complete the stool sample and return to our office. We will be in touch shortly regarding a possible colonoscopy.   Referral has been made to ENT Dr. Suszanne Conners on March 13th at 9:00 am

## 2012-10-25 ENCOUNTER — Encounter: Payer: Self-pay | Admitting: Gastroenterology

## 2012-10-25 NOTE — Assessment & Plan Note (Signed)
Continue Protonix BID.  

## 2012-10-25 NOTE — Assessment & Plan Note (Signed)
Non-critical Schatzki's ring s/p dilation May 2013 by Dr. Jena Gauss. 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 Pt notes recurrence of "tickle" in throat. Needs to be referred again to ENT in his network.

## 2012-10-25 NOTE — Assessment & Plan Note (Addendum)
57 year old male with chronic abdominal pain, EGD on file from May 2013 without etiology. Pain worsened at times by eating. +nausea in mornings improving throughout the day. With his history of diabetes, may have underlying gastroparesis. CT May 2013 without etiology for abdominal pain. Low likelihood of mesenteric ischemia. However, will discuss CT with radiologist. Obtain ifobt, if positive consider updated colonoscopy as last one was several years ago at an outside facility. Likely dealing with a component of chronic abdominal pain. Further recommendations after ifobt obtained and CT reviewed with radiologist. Zofran BID for now. Consider GES if necessary.

## 2012-10-26 NOTE — Progress Notes (Signed)
Faxed to PCP

## 2012-10-28 ENCOUNTER — Ambulatory Visit (INDEPENDENT_AMBULATORY_CARE_PROVIDER_SITE_OTHER): Payer: Medicare HMO | Admitting: Gastroenterology

## 2012-10-28 DIAGNOSIS — R109 Unspecified abdominal pain: Secondary | ICD-10-CM

## 2012-10-28 LAB — IFOBT (OCCULT BLOOD): IFOBT: NEGATIVE

## 2012-10-31 NOTE — Progress Notes (Signed)
Quick Note:  Negative ifobt. How is nausea with Zofran BID? ______

## 2012-11-02 NOTE — Progress Notes (Signed)
Quick Note:  Tried to call pt- LM for return call ______ 

## 2012-11-03 ENCOUNTER — Telehealth: Payer: Self-pay | Admitting: Internal Medicine

## 2012-11-03 NOTE — Telephone Encounter (Signed)
Pt is experiencing some nausea still. Do you want GES?

## 2012-11-03 NOTE — Telephone Encounter (Signed)
Pt called to let us know that last night he woke up with severe pain that started in his breast bone and went down to right side. He said this happened twice during the night and lasted about 30 minutes. He is still in pain, but not as bad as last night. Please advise and call patient at [phone]

## 2012-11-03 NOTE — Telephone Encounter (Signed)
Spoke with pt- he had a migraine all day yesterday and then last night had 2 episodes that caused him pain that started in his esophagus then moved straight down into his abd. 1st episode lasted for about 10 minutes, the second episode lasted about 30 minutes. Had difficulty breathing during episode. No vomiting, no fiver, not sweaty, no diarrhea, a little nausea, last bm was yesterday and it was normal. He feels somewhat better today.   Any further recommendations?

## 2012-11-03 NOTE — Telephone Encounter (Signed)
If this episode occurs again, he needs to seek medical attention to rule out cardiac etiology. The shortness of breath concerns me, specifically with the chest pain.  His ifobt was negative, and we had discussed possibly proceeding with a GES if necessary.  Is he still dealing with nausea?

## 2012-11-03 NOTE — Telephone Encounter (Signed)
Pt is aware to seek medical attention if this happens again.

## 2012-11-04 NOTE — Progress Notes (Signed)
Quick Note:  Let's obtain GES, with subsequent follow-up in office to review results. ______

## 2012-11-07 ENCOUNTER — Other Ambulatory Visit: Payer: Self-pay | Admitting: Gastroenterology

## 2012-11-07 DIAGNOSIS — R110 Nausea: Secondary | ICD-10-CM

## 2012-11-07 DIAGNOSIS — R1314 Dysphagia, pharyngoesophageal phase: Secondary | ICD-10-CM

## 2012-11-07 NOTE — Progress Notes (Signed)
Quick Note:  Brian [name], please schedule GES. Pt was informed that we may be calling to schedule this. Darl Pikes, pt needs ov after GES ______

## 2012-11-07 NOTE — Telephone Encounter (Signed)
GES ordered.

## 2012-11-07 NOTE — Progress Notes (Signed)
Patient is scheduled for GES on Thursday March 13th at 8:00 am and he is aware

## 2012-11-10 ENCOUNTER — Encounter (HOSPITAL_COMMUNITY)
Admission: RE | Admit: 2012-11-10 | Discharge: 2012-11-10 | Disposition: A | Discharge status: Home / Self Care | Payer: Medicare HMO | Source: Ambulatory Visit | Attending: Gastroenterology | Admitting: Gastroenterology

## 2012-11-10 ENCOUNTER — Ambulatory Visit (INDEPENDENT_AMBULATORY_CARE_PROVIDER_SITE_OTHER): Payer: Medicare HMO | Admitting: Otolaryngology

## 2012-11-10 ENCOUNTER — Encounter (HOSPITAL_COMMUNITY): Payer: Self-pay

## 2012-11-10 DIAGNOSIS — R1310 Dysphagia, unspecified: Secondary | ICD-10-CM | POA: Insufficient documentation

## 2012-11-10 DIAGNOSIS — R933 Abnormal findings on diagnostic imaging of other parts of digestive tract: Secondary | ICD-10-CM | POA: Insufficient documentation

## 2012-11-10 DIAGNOSIS — R110 Nausea: Secondary | ICD-10-CM | POA: Insufficient documentation

## 2012-11-10 DIAGNOSIS — R490 Dysphonia: Secondary | ICD-10-CM

## 2012-11-10 DIAGNOSIS — R070 Pain in throat: Secondary | ICD-10-CM

## 2012-11-10 DIAGNOSIS — R1314 Dysphagia, pharyngoesophageal phase: Secondary | ICD-10-CM

## 2012-11-10 DIAGNOSIS — K219 Gastro-esophageal reflux disease without esophagitis: Secondary | ICD-10-CM

## 2012-11-10 MED ORDER — TECHNETIUM TC 99M SULFUR COLLOID
2.0000 | Freq: Once | INTRAVENOUS | Status: AC | PRN
  Administered 2012-11-10: 2 via ORAL

## 2012-11-14 NOTE — Progress Notes (Signed)
Quick Note:  GES abnormal. Question of gastric outlet obstruction. Clinically, patient does not appear to have GOO.  I am discussing with RMR updated EGD. Please obtain PR on patient. Is he vomiting? Able to keep down solids/liquids? Continue PPI BID, Zofran prn.  Gastroparesis diet handout to patient. ______

## 2012-11-15 NOTE — Progress Notes (Signed)
Quick Note:  Discussed with RMR. No need for EGD, as fairly recent one is on file from May 2013.  Needs to strictly follow gastroparesis diet, PPI BID, Zofran prn. Needs routine follow-up in next 6-8 weeks. ______

## 2012-11-22 ENCOUNTER — Encounter: Payer: Self-pay | Admitting: Internal Medicine

## 2012-11-22 NOTE — Progress Notes (Signed)
Quick Note:  Tried to call pt- he was not home. LM to return call. ______

## 2012-11-24 NOTE — Progress Notes (Signed)
Quick Note:  Have been unable to reach pt. Letter mailed. Darl Pikes, pt needs ov in 6-8 weeks ______

## 2012-12-08 ENCOUNTER — Ambulatory Visit (INDEPENDENT_AMBULATORY_CARE_PROVIDER_SITE_OTHER): Payer: Medicare HMO | Admitting: Otolaryngology

## 2012-12-12 ENCOUNTER — Ambulatory Visit (INDEPENDENT_AMBULATORY_CARE_PROVIDER_SITE_OTHER): Payer: Medicare HMO | Admitting: Gastroenterology

## 2012-12-12 ENCOUNTER — Encounter: Payer: Self-pay | Admitting: Gastroenterology

## 2012-12-12 VITALS — BP 142/81 | HR 80 | Temp 98.0°F | Ht 72.0 in | Wt 215.6 lb

## 2012-12-12 DIAGNOSIS — R1310 Dysphagia, unspecified: Secondary | ICD-10-CM

## 2012-12-12 DIAGNOSIS — R109 Unspecified abdominal pain: Secondary | ICD-10-CM

## 2012-12-12 DIAGNOSIS — G8929 Other chronic pain: Secondary | ICD-10-CM

## 2012-12-12 DIAGNOSIS — K3184 Gastroparesis: Secondary | ICD-10-CM

## 2012-12-12 NOTE — Patient Instructions (Addendum)
Continue taking Protonix twice a day and Zofran as needed.   STOP THESE MEDICATIONS: Bentyl Reglan  Start taking supplemental fiber daily such as Metamucil or Benefiber. If you still need help with constipation, you may take Miralax daily as needed.   If any changes please call us. We may need to proceed with a colonoscopy in the future.   We have referred you to Pain Management for further assessment of abdominal pain.  Dr. Jena Gauss will see you again in 3 months.
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
## 2012-12-13 ENCOUNTER — Telehealth: Payer: Self-pay | Admitting: Gastroenterology

## 2012-12-13 DIAGNOSIS — R1310 Dysphagia, unspecified: Secondary | ICD-10-CM | POA: Insufficient documentation

## 2012-12-13 DIAGNOSIS — K3184 Gastroparesis: Secondary | ICD-10-CM | POA: Insufficient documentation

## 2012-12-13 HISTORY — DX: Dysphagia, unspecified: R13.10

## 2012-12-13 NOTE — Assessment & Plan Note (Signed)
Newly diagnosed, no nausea/vomiting. Decreased appetite but improved from last visit. Zofran BID. I would like to avoid Reglan in this gentleman. Continue PPI BID. Gastroparesis diet.

## 2012-12-13 NOTE — Progress Notes (Signed)
Cc PCP 

## 2012-12-13 NOTE — Assessment & Plan Note (Signed)
EGD performed due to dysphagia and dyspepsia May 2013 by Dr. Jena Gauss noted cystic lesion at arytenoid cartilage on the right, non-critical Schatzki's ring s/p dilation, gastric erosions with negative H.pylori. He was referred to ENT, with subsequent cystic lesion removal

## 2012-12-13 NOTE — Telephone Encounter (Signed)
Can we retrieve the op notes from his colonoscopy in [place]? We may not be able to .  Dr. Ralene Cork is his name I believe.

## 2012-12-13 NOTE — Assessment & Plan Note (Signed)
RESOLVED. EGD performed due to dysphagia and dyspepsia May 2013 by Dr. Jena Gauss noted cystic lesion at arytenoid cartilage on the right, non-critical Schatzki's ring s/p dilation, gastric erosions with negative H.pylori. Brian [name] was referred to ENT, with subsequent cystic lesion removal

## 2012-12-13 NOTE — Assessment & Plan Note (Addendum)
With positive Carnett's sign on exam. I question referred back pain, doubt GI etiology. Previous history of chronic diarrhea, now with constipation. Zofran likely worsening this. Stop Bentyl. Add supplemental fiber daily, Miralax prn. Last colonoscopy in [place], and I do not have the op notes for this. Will attempt to retrieve. According to epic notes, need surveillance in 2016 due to history of adenomatous polyps. May need to pursue lower GI evaluation through our office, as we have not ever performed a colonoscopy on this nice gentleman. No rectal bleeding noted. For now, refer to Pain Management and return in 3 months to see Dr. Jena Gauss only.

## 2012-12-13 NOTE — Telephone Encounter (Signed)
If patient has already left then No because he would need to sign a medical release but if he is still here then yes

## 2012-12-22 ENCOUNTER — Ambulatory Visit (INDEPENDENT_AMBULATORY_CARE_PROVIDER_SITE_OTHER): Payer: Medicare HMO | Admitting: Otolaryngology

## 2012-12-29 DIAGNOSIS — Z8619 Personal history of other infectious and parasitic diseases: Secondary | ICD-10-CM

## 2012-12-29 HISTORY — DX: Personal history of other infectious and parasitic diseases: Z86.19

## 2013-01-27 ENCOUNTER — Encounter (HOSPITAL_COMMUNITY): Payer: Self-pay | Admitting: *Deleted

## 2013-01-27 ENCOUNTER — Inpatient Hospital Stay (HOSPITAL_COMMUNITY)
Admission: EM | Admit: 2013-01-27 | Discharge: 2013-01-29 | DRG: 372 | Disposition: A | Discharge status: Home / Self Care | Payer: Medicare HMO | Attending: Internal Medicine | Admitting: Internal Medicine

## 2013-01-27 ENCOUNTER — Emergency Department (HOSPITAL_COMMUNITY): Payer: Medicare HMO

## 2013-01-27 DIAGNOSIS — I10 Essential (primary) hypertension: Secondary | ICD-10-CM | POA: Diagnosis present

## 2013-01-27 DIAGNOSIS — K589 Irritable bowel syndrome without diarrhea: Secondary | ICD-10-CM | POA: Diagnosis present

## 2013-01-27 DIAGNOSIS — K625 Hemorrhage of anus and rectum: Secondary | ICD-10-CM | POA: Diagnosis present

## 2013-01-27 DIAGNOSIS — Z79899 Other long term (current) drug therapy: Secondary | ICD-10-CM

## 2013-01-27 DIAGNOSIS — E119 Type 2 diabetes mellitus without complications: Secondary | ICD-10-CM | POA: Diagnosis present

## 2013-01-27 DIAGNOSIS — M62838 Other muscle spasm: Secondary | ICD-10-CM | POA: Diagnosis present

## 2013-01-27 DIAGNOSIS — K922 Gastrointestinal hemorrhage, unspecified: Secondary | ICD-10-CM | POA: Diagnosis present

## 2013-01-27 DIAGNOSIS — G8929 Other chronic pain: Secondary | ICD-10-CM | POA: Diagnosis present

## 2013-01-27 DIAGNOSIS — K219 Gastro-esophageal reflux disease without esophagitis: Secondary | ICD-10-CM | POA: Diagnosis present

## 2013-01-27 DIAGNOSIS — Z720 Tobacco use: Secondary | ICD-10-CM | POA: Diagnosis present

## 2013-01-27 DIAGNOSIS — A0472 Enterocolitis due to Clostridium difficile, not specified as recurrent: Secondary | ICD-10-CM | POA: Diagnosis present

## 2013-01-27 DIAGNOSIS — Z833 Family history of diabetes mellitus: Secondary | ICD-10-CM

## 2013-01-27 DIAGNOSIS — R109 Unspecified abdominal pain: Secondary | ICD-10-CM | POA: Diagnosis present

## 2013-01-27 DIAGNOSIS — F17200 Nicotine dependence, unspecified, uncomplicated: Secondary | ICD-10-CM | POA: Diagnosis present

## 2013-01-27 DIAGNOSIS — E785 Hyperlipidemia, unspecified: Secondary | ICD-10-CM | POA: Diagnosis present

## 2013-01-27 DIAGNOSIS — E7800 Pure hypercholesterolemia, unspecified: Secondary | ICD-10-CM | POA: Diagnosis present

## 2013-01-27 DIAGNOSIS — E1149 Type 2 diabetes mellitus with other diabetic neurological complication: Secondary | ICD-10-CM | POA: Diagnosis present

## 2013-01-27 DIAGNOSIS — G43909 Migraine, unspecified, not intractable, without status migrainosus: Secondary | ICD-10-CM | POA: Diagnosis present

## 2013-01-27 DIAGNOSIS — K3184 Gastroparesis: Secondary | ICD-10-CM | POA: Diagnosis present

## 2013-01-27 DIAGNOSIS — G40909 Epilepsy, unspecified, not intractable, without status epilepticus: Secondary | ICD-10-CM | POA: Diagnosis present

## 2013-01-27 HISTORY — DX: Noninfective gastroenteritis and colitis, unspecified: K52.9

## 2013-01-27 HISTORY — DX: Diverticulosis of intestine, part unspecified, without perforation or abscess without bleeding: K57.90

## 2013-01-27 LAB — CBC WITH DIFFERENTIAL/PLATELET
Basophils Absolute: 0.1 10*3/uL (ref 0.0–0.1)
Basophils Relative: 1 % (ref 0–1)
Eosinophils Absolute: 0.5 10*3/uL (ref 0.0–0.7)
Eosinophils Relative: 6 % — ABNORMAL HIGH (ref 0–5)
HCT: 38.1 % — ABNORMAL LOW (ref 39.0–52.0)
Hemoglobin: 13.1 g/dL (ref 13.0–17.0)
Lymphocytes Relative: 34 % (ref 12–46)
Lymphs Abs: 3.1 10*3/uL (ref 0.7–4.0)
MCH: 32.8 pg (ref 26.0–34.0)
MCHC: 34.4 g/dL (ref 30.0–36.0)
MCV: 95.5 fL (ref 78.0–100.0)
Monocytes Absolute: 0.7 10*3/uL (ref 0.1–1.0)
Monocytes Relative: 8 % (ref 3–12)
Neutro Abs: 4.6 10*3/uL (ref 1.7–7.7)
Neutrophils Relative %: 51 % (ref 43–77)
Platelets: 218 10*3/uL (ref 150–400)
RBC: 3.99 MIL/uL — ABNORMAL LOW (ref 4.22–5.81)
RDW: 13.2 % (ref 11.5–15.5)
WBC: 9 10*3/uL (ref 4.0–10.5)

## 2013-01-27 LAB — URINALYSIS, ROUTINE W REFLEX MICROSCOPIC
Bilirubin Urine: NEGATIVE
Glucose, UA: NEGATIVE mg/dL
Hgb urine dipstick: NEGATIVE
Ketones, ur: NEGATIVE mg/dL
Leukocytes, UA: NEGATIVE
Nitrite: NEGATIVE
Protein, ur: NEGATIVE mg/dL
Specific Gravity, Urine: 1.01 (ref 1.005–1.030)
Urobilinogen, UA: 0.2 mg/dL (ref 0.0–1.0)
pH: 6 (ref 5.0–8.0)

## 2013-01-27 LAB — COMPREHENSIVE METABOLIC PANEL
ALT: 21 U/L (ref 0–53)
AST: 18 U/L (ref 0–37)
Albumin: 4 g/dL (ref 3.5–5.2)
Alkaline Phosphatase: 73 U/L (ref 39–117)
BUN: 20 mg/dL (ref 6–23)
CO2: 22 mEq/L (ref 19–32)
Calcium: 8.8 mg/dL (ref 8.4–10.5)
Chloride: 105 mEq/L (ref 96–112)
Creatinine, Ser: 1.1 mg/dL (ref 0.50–1.35)
GFR calc Af Amer: 85 mL/min — ABNORMAL LOW (ref >90)
GFR calc non Af Amer: 73 mL/min — ABNORMAL LOW (ref >90)
Glucose, Bld: 133 mg/dL — ABNORMAL HIGH (ref 70–99)
Potassium: 3.5 mEq/L (ref 3.5–5.1)
Sodium: 141 mEq/L (ref 135–145)
Total Bilirubin: 0.2 mg/dL — ABNORMAL LOW (ref 0.3–1.2)
Total Protein: 7.2 g/dL (ref 6.0–8.3)

## 2013-01-27 LAB — LIPASE, BLOOD: Lipase: 31 U/L (ref 11–59)

## 2013-01-27 LAB — CLOSTRIDIUM DIFFICILE BY PCR: Toxigenic C. Difficile by PCR: POSITIVE — AB

## 2013-01-27 LAB — LACTIC ACID, PLASMA: Lactic Acid, Venous: 1.5 mmol/L (ref 0.5–2.2)

## 2013-01-27 MED ORDER — MORPHINE SULFATE 4 MG/ML IJ SOLN
4.0000 mg | INTRAMUSCULAR | Status: AC | PRN
  Administered 2013-01-27 – 2013-01-28 (×2): 4 mg via INTRAVENOUS
  Filled 2013-01-27 (×2): qty 1

## 2013-01-27 MED ORDER — SODIUM CHLORIDE 0.9 % IV SOLN
INTRAVENOUS | Status: DC
  Administered 2013-01-27: 21:00:00 via INTRAVENOUS

## 2013-01-27 MED ORDER — METRONIDAZOLE 500 MG PO TABS
500.0000 mg | ORAL_TABLET | Freq: Three times a day (TID) | ORAL | Status: DC
  Administered 2013-01-27: 500 mg via ORAL
  Filled 2013-01-27: qty 1

## 2013-01-27 MED ORDER — IOHEXOL 300 MG/ML  SOLN
50.0000 mL | Freq: Once | INTRAMUSCULAR | Status: AC | PRN
  Administered 2013-01-27: 50 mL via ORAL

## 2013-01-27 MED ORDER — IOHEXOL 300 MG/ML  SOLN
100.0000 mL | Freq: Once | INTRAMUSCULAR | Status: AC | PRN
  Administered 2013-01-27: 100 mL via INTRAVENOUS

## 2013-01-27 MED ORDER — ONDANSETRON HCL 4 MG/2ML IJ SOLN
4.0000 mg | INTRAMUSCULAR | Status: DC | PRN
  Administered 2013-01-27: 4 mg via INTRAVENOUS
  Filled 2013-01-27: qty 2

## 2013-01-27 NOTE — ED Provider Notes (Signed)
History     CSN: [id number]  Arrival date & time 01/27/13  2005   First MD Initiated Contact with Patient 01/27/13 2023      Chief Complaint  Patient presents with  . Abdominal Pain  . Diarrhea  . Rectal Bleeding     HPI Pt was seen at 2030.   Per pt, c/o gradual onset and persistence of constant generalized abd "pain" since yesterday.  Has been associated with nausea and multiple intermittent episodes of diarrhea ("at least 6").  Describes the diarrhea as "bloody."  Pt has hx of chronic abd pain and diarrhea, but not blood in stools. Pt states he also has a hx of diverticulitis, and is concerned regarding same today. States his last abx use was the 1st week of May (PCN s/p tooth extraction).  No recent travel. Denies others in household with symptoms. Denies vomiting, no fevers, no back pain, no rash, no CP/SOB, no black stools.      GI MD: Dr Jena Gauss Past Medical History  Diagnosis Date  . DM (diabetes mellitus)   . HTN (hypertension)   . IBS (irritable bowel syndrome)   . GERD (gastroesophageal reflux disease)   . High cholesterol   . Chronic abdominal pain 08/11/11    Reported per patient, Dr. Ralene Cork in [place]  . Adenomatous colon polyp     due for surveillance 2016 per epic notes, performed at outside facility   . Seizures     1 seizure with severe back pain. no further meds or seizures  . Headache(784.0)     migraines  . Gastroparesis   . Chronic diarrhea   . Diverticulosis     Past Surgical History  Procedure Laterality Date  . Tonsillectomy    . Arm surgery      tendon/left  . Esophagogastroduodenoscopy  09/02/10    [facility], Dr. 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 pylori  . Esophagogastroduodenoscopy  02/03/2006    Dr. Steele Berg White-> hiatal hernia, atrial  erosions  . Cholecystectomy  11/20/2011    Procedure: LAPAROSCOPIC CHOLECYSTECTOMY;  Surgeon: Fabio Bering, MD;  Location: [facility];  Service: General;  Laterality: N/A;  . Liver biopsy  11/20/2011    Procedure: LIVER BIOPSY;  Surgeon: Fabio Bering, MD;  Location: [facility];  Service: General;  Laterality: N/A;  . Esophagogastroduodenoscopy  01/21/2012    QIO:NGEXBM lesion at arytenoid cartilage on the right-likely explains some of his oro- pharyngeal symptoms/Hiatal hernia/Schatzki's ring s/p dilation, gastric erosions without H.pylori  . Larynx surgery      cyst removed, [facility] [place]  . Vena cava filter placement      Family History  Problem Relation Age of Onset  . Cancer Father 44    Gallbladder  . Anesthesia problems Neg Hx   . Hypotension Neg Hx   . Malignant hyperthermia Neg Hx   . Pseudochol deficiency Neg Hx     History  Substance Use Topics  . Smoking status: Current Every Day Smoker -- 1.00 packs/day for 42 years    Types: Cigarettes  . Smokeless tobacco: Not on file  . Alcohol Use: No     Comment: last etoh 2003, daily up until then      Review of Systems ROS: Statement: All systems negative except as marked or noted in the HPI; Constitutional: Negative for fever  and chills. ; ; Eyes: Negative for eye pain, redness and discharge. ; ; ENMT: Negative for ear pain, hoarseness, nasal congestion, sinus pressure and sore throat. ; ; Cardiovascular: Negative for chest pain, palpitations, diaphoresis, dyspnea and peripheral edema. ; ; Respiratory: Negative for cough, wheezing and stridor. ; ; Gastrointestinal: Negative for vomiting, hematemesis, jaundice. +diarrhea, nausea, abd pain, blood in stool, rectal bleeding. . ; ; Genitourinary: Negative for dysuria, flank pain and hematuria. ; ; Musculoskeletal: Negative for back pain and neck pain. Negative for swelling and trauma.; ; Skin: Negative for pruritus, rash, abrasions, blisters, bruising and skin lesion.; ; Neuro: Negative  for headache, lightheadedness and neck stiffness. Negative for weakness, altered level of consciousness , altered mental status, extremity weakness, paresthesias, involuntary movement, seizure and syncope.       Allergies  Ketorolac tromethamine; Sulfamethoxazole; and Tetracyclines & related  Home Medications   Current Outpatient Rx  Name  Route  Sig  Dispense  Refill  . ACETAMINOPHEN-BUTALBITAL (BUPAP) 50-650 MG TABS   Oral   Take 1 tablet by mouth every 6 (six) hours as needed. For headaches         . amitriptyline (ELAVIL) 100 MG tablet   Oral   Take 100 mg by mouth at bedtime.         Marland Kitchen amLODipine-valsartan (EXFORGE) 5-160 MG per tablet   Oral   Take 1 tablet by mouth 2 (two) times daily.         Marland Kitchen BIOTIN PO   Oral   Take 10,000 mg by mouth daily.         . cyclobenzaprine (FLEXERIL) 5 MG tablet   Oral   Take 5 mg by mouth 3 (three) times daily as needed for muscle spasms.         . diphenhydrAMINE (BENADRYL) 25 MG tablet   Oral   Take 25 mg by mouth 2 (two) times daily.         . DULoxetine (CYMBALTA) 60 MG capsule   Oral   Take 60 mg by mouth daily.         Marland Kitchen gabapentin (NEURONTIN) 100 MG capsule   Oral   Take 100 mg by mouth 2 (two) times daily.          . metaxalone (SKELAXIN) 800 MG tablet   Oral   Take 800 mg by mouth daily as needed for pain. For muscle spams         . metFORMIN (GLUCOPHAGE) 500 MG tablet   Oral   Take 500 mg by mouth every morning.          Marland Kitchen MILK THISTLE PO   Oral   Take 1,000 mg by mouth daily.         . Multiple Vitamin (MULTIVITAMIN) capsule   Oral   Take 1 capsule by mouth daily.         . Niacin-Simvastatin [facility]) 500-40 MG TB24   Oral   Take 1 tablet by mouth every evening.          . pantoprazole (PROTONIX) 40 MG tablet   Oral   Take 1 tablet (40 mg total) by mouth 2 (two) times daily.   60 tablet   5   . pioglitazone (ACTOS) 15 MG tablet   Oral   Take 15 mg by mouth daily.          . promethazine (PHENERGAN) 25 MG tablet   Oral   Take 25 mg by mouth every 6 (  six) hours as needed. For nausea         . ranitidine (ZANTAC) 150 MG tablet   Oral   Take 150 mg by mouth at bedtime.          . sucralfate (CARAFATE) 1 G tablet   Oral   Take 1 g by mouth 4 (four) times daily.         Marland Kitchen topiramate (TOPAMAX) 100 MG tablet   Oral   Take 100 mg by mouth 2 (two) times daily.         . traMADol (ULTRAM) 50 MG tablet   Oral   Take 50 mg by mouth every 6 (six) hours as needed for pain.         . vitamin B-12 (CYANOCOBALAMIN) 1000 MCG tablet   Oral   Take 1,000 mcg by mouth 2 (two) times daily.         . vitamin E 400 UNIT capsule   Oral   Take 400 Units by mouth 2 (two) times daily.         . clidinium-chlordiazePOXIDE (LIBRAX) 2.5-5 MG per capsule   Oral   Take 1 capsule by mouth 3 (three) times daily as needed. For stomach         . ondansetron (ZOFRAN) 4 MG tablet   Oral   Take 1 tablet (4 mg total) by mouth 2 (two) times daily.   60 tablet   3     BP 148/76  Pulse 85  Temp(Src) 98.6 F (37 C) (Oral)  Resp 16  Ht 6' (1.829 m)  Wt 215 lb (97.523 kg)  BMI 29.15 kg/m2  SpO2 99%  Physical Exam 2035: Physical examination:  Nursing notes reviewed; Vital signs and O2 SAT reviewed;  Constitutional: Well developed, Well nourished, Well hydrated, In no acute distress; Head:  Normocephalic, atraumatic; Eyes: EOMI, PERRL, No scleral icterus; ENMT: Mouth and pharynx normal, Mucous membranes moist; Neck: Supple, Full range of motion, No lymphadenopathy; Cardiovascular: Regular rate and rhythm, No gallop; Respiratory: Breath sounds clear & equal bilaterally, No rales, rhonchi, wheezes.  Speaking full sentences with ease, Normal respiratory effort/excursion; Chest: Nontender, Movement normal; Abdomen: Soft, +RLQ and LLQ tenderness to palp. No rebound or guarding. Nondistended, Normal bowel sounds. Rectal exam performed w/permission of pt and ED RN  chaperone present.  Anal tone normal.  Non-tender, soft brown stool in rectal vault, heme positive.  No fissures, no external hemorrhoids, no palp masses.; Genitourinary: No CVA tenderness; Extremities: Pulses normal, No tenderness, No edema, No calf edema or asymmetry.; Neuro: AA&Ox3, Major CN grossly intact.  Speech clear. No gross focal motor or sensory deficits in extremities.; Skin: Color normal, Warm, Dry.   ED Course  Procedures    MDM  MDM Reviewed: previous chart, nursing note and vitals Reviewed previous: labs Interpretation: labs and CT scan   Results for orders placed during the hospital encounter of 01/27/13  CLOSTRIDIUM DIFFICILE BY PCR      Result Value Range   C difficile by pcr POSITIVE (*) NEGATIVE  URINALYSIS, ROUTINE W REFLEX MICROSCOPIC      Result Value Range   Color, Urine YELLOW  YELLOW   APPearance CLEAR  CLEAR   Specific Gravity, Urine 1.010  1.005 - 1.030   pH 6.0  5.0 - 8.0   Glucose, UA NEGATIVE  NEGATIVE mg/dL   Hgb urine dipstick NEGATIVE  NEGATIVE   Bilirubin Urine NEGATIVE  NEGATIVE   Ketones, ur NEGATIVE  NEGATIVE mg/dL  Protein, ur NEGATIVE  NEGATIVE mg/dL   Urobilinogen, UA 0.2  0.0 - 1.0 mg/dL   Nitrite NEGATIVE  NEGATIVE   Leukocytes, UA NEGATIVE  NEGATIVE  CBC WITH DIFFERENTIAL      Result Value Range   WBC 9.0  4.0 - 10.5 K/uL   RBC 3.99 (*) 4.22 - 5.81 MIL/uL   Hemoglobin 13.1  13.0 - 17.0 g/dL   HCT 16.1 (*) 09.6 - 04.5 %   MCV 95.5  78.0 - 100.0 fL   MCH 32.8  26.0 - 34.0 pg   MCHC 34.4  30.0 - 36.0 g/dL   RDW 40.9  81.1 - 91.4 %   Platelets 218  150 - 400 K/uL   Neutrophils Relative % 51  43 - 77 %   Neutro Abs 4.6  1.7 - 7.7 K/uL   Lymphocytes Relative 34  12 - 46 %   Lymphs Abs 3.1  0.7 - 4.0 K/uL   Monocytes Relative 8  3 - 12 %   Monocytes Absolute 0.7  0.1 - 1.0 K/uL   Eosinophils Relative 6 (*) 0 - 5 %   Eosinophils Absolute 0.5  0.0 - 0.7 K/uL   Basophils Relative 1  0 - 1 %   Basophils Absolute 0.1  0.0 - 0.1  K/uL  COMPREHENSIVE METABOLIC PANEL      Result Value Range   Sodium 141  135 - 145 mEq/L   Potassium 3.5  3.5 - 5.1 mEq/L   Chloride 105  96 - 112 mEq/L   CO2 22  19 - 32 mEq/L   Glucose, Bld 133 (*) 70 - 99 mg/dL   BUN 20  6 - 23 mg/dL   Creatinine, Ser 7.82  0.50 - 1.35 mg/dL   Calcium 8.8  8.4 - 95.6 mg/dL   Total Protein 7.2  6.0 - 8.3 g/dL   Albumin 4.0  3.5 - 5.2 g/dL   AST 18  0 - 37 U/L   ALT 21  0 - 53 U/L   Alkaline Phosphatase 73  39 - 117 U/L   Total Bilirubin 0.2 (*) 0.3 - 1.2 mg/dL   GFR calc non Af Amer 73 (*) >90 mL/min   GFR calc Af Amer 85 (*) >90 mL/min  LIPASE, BLOOD      Result Value Range   Lipase 31  11 - 59 U/L  LACTIC ACID, PLASMA      Result Value Range   Lactic Acid, Venous 1.5  0.5 - 2.2 mmol/L   Ct Abdomen Pelvis W Contrast 01/27/2013   *RADIOLOGY REPORT*  Clinical Data: Diarrhea, bloody stool  CT ABDOMEN AND PELVIS WITH CONTRAST  Technique:  Multidetector CT imaging of the abdomen and pelvis was performed following the standard protocol during bolus administration of intravenous contrast.  Contrast: 50mL OMNIPAQUE IOHEXOL 300 MG/ML  SOLN, OMNIPAQUE IOHEXOL 300 MG/ML  SOLN  Comparison: 01/05/2012  Findings: Limited images through the lung bases demonstrate no significant appreciable abnormality. The heart size is within normal limits. Coronary artery calcification.  No pleural or pericardial effusion.  Unremarkable liver, spleen, pancreas, adrenal glands.  Absent gallbladder.  No biliary ductal dilatation.  There are a couple subcentimeter hypodensities arising from the kidneys bilaterally.  Too small to further characterize.  No hydronephrosis or hydroureter.  Decompressed colon limits evaluation.  No overt colitis.  Normal appendix.  Normal terminal ileum.  Small bowel loops are normal course and caliber.  No free intraperitoneal air or fluid.  No lymphadenopathy.  Left- sided IVC.  Scattered atherosclerosis of the aorta and branch vessels without  aneurysmal dilatation.  Thin-walled bladder.  Upper normal prostate.  No acute osseous finding.  IMPRESSION: No acute abdominopelvic process identified by CT.   Original Report Authenticated By: Jearld Lesch, M.D.    2250:  Pt had one small brown/bloody BM while in the ED over the last 2-3 hours. No N/V. VS remain stable. EGD 12/2011 without hx of ulcer or esophageal varices.  Pt not on blood thinners, H/H and LFT's are normal.  Cdiff positive. Dx and testing d/w pt and family.  Questions answered.  Verb understanding, agreeable to observation admit.   2300:  T/C to [facility] Dr. Orvan Falconer, case discussed, including:  HPI, pertinent PM/SHx, VS/PE, dx testing, ED course and treatment:  Agreeable to observation admit, requests to write temporary orders, obtain medical bed.        Laray Anger, DO 01/30/13 [phone]

## 2013-01-27 NOTE — ED Notes (Signed)
BRB stools x 6 today.  Had diarrhea last pm. abd pain since 2006.  Hx of diverticulitis  Nausea, no vomiting

## 2013-01-28 ENCOUNTER — Encounter (HOSPITAL_COMMUNITY): Payer: Self-pay | Admitting: *Deleted

## 2013-01-28 ENCOUNTER — Inpatient Hospital Stay (HOSPITAL_COMMUNITY): Payer: Medicare HMO

## 2013-01-28 DIAGNOSIS — A0472 Enterocolitis due to Clostridium difficile, not specified as recurrent: Secondary | ICD-10-CM | POA: Diagnosis present

## 2013-01-28 DIAGNOSIS — R109 Unspecified abdominal pain: Secondary | ICD-10-CM

## 2013-01-28 DIAGNOSIS — K922 Gastrointestinal hemorrhage, unspecified: Secondary | ICD-10-CM | POA: Diagnosis present

## 2013-01-28 DIAGNOSIS — Z720 Tobacco use: Secondary | ICD-10-CM | POA: Diagnosis present

## 2013-01-28 DIAGNOSIS — R197 Diarrhea, unspecified: Secondary | ICD-10-CM

## 2013-01-28 DIAGNOSIS — K625 Hemorrhage of anus and rectum: Secondary | ICD-10-CM

## 2013-01-28 DIAGNOSIS — E119 Type 2 diabetes mellitus without complications: Secondary | ICD-10-CM | POA: Diagnosis present

## 2013-01-28 DIAGNOSIS — G8929 Other chronic pain: Secondary | ICD-10-CM

## 2013-01-28 HISTORY — DX: Type 2 diabetes mellitus without complications: E11.9

## 2013-01-28 LAB — CBC
HCT: 36.6 % — ABNORMAL LOW (ref 39.0–52.0)
Hemoglobin: 12.5 g/dL — ABNORMAL LOW (ref 13.0–17.0)
MCH: 33 pg (ref 26.0–34.0)
MCHC: 34.2 g/dL (ref 30.0–36.0)
MCV: 96.6 fL (ref 78.0–100.0)
Platelets: 200 10*3/uL (ref 150–400)
RBC: 3.79 MIL/uL — ABNORMAL LOW (ref 4.22–5.81)
RDW: 13.4 % (ref 11.5–15.5)
WBC: 8 10*3/uL (ref 4.0–10.5)

## 2013-01-28 LAB — GLUCOSE, CAPILLARY
Glucose-Capillary: 92 mg/dL (ref 70–99)
Glucose-Capillary: 111 mg/dL — ABNORMAL HIGH (ref 70–99)
Glucose-Capillary: 74 mg/dL (ref 70–99)
Glucose-Capillary: 114 mg/dL — ABNORMAL HIGH (ref 70–99)

## 2013-01-28 LAB — PROTIME-INR
INR: 1.04 (ref 0.00–1.49)
Prothrombin Time: 13.5 seconds (ref 11.6–15.2)

## 2013-01-28 LAB — BASIC METABOLIC PANEL
BUN: 16 mg/dL (ref 6–23)
CO2: 24 mEq/L (ref 19–32)
Calcium: 8 mg/dL — ABNORMAL LOW (ref 8.4–10.5)
Chloride: 108 mEq/L (ref 96–112)
Creatinine, Ser: 1.08 mg/dL (ref 0.50–1.35)
GFR calc Af Amer: 87 mL/min — ABNORMAL LOW (ref >90)
GFR calc non Af Amer: 75 mL/min — ABNORMAL LOW (ref >90)
Glucose, Bld: 114 mg/dL — ABNORMAL HIGH (ref 70–99)
Potassium: 3.7 mEq/L (ref 3.5–5.1)
Sodium: 142 mEq/L (ref 135–145)

## 2013-01-28 LAB — ABO/RH: ABO/RH(D): A POS

## 2013-01-28 LAB — APTT: aPTT: 29 seconds (ref 24–37)

## 2013-01-28 LAB — HEMOGLOBIN A1C
Hgb A1c MFr Bld: 5.6 % (ref <5.7)
Mean Plasma Glucose: 114 mg/dL (ref <117)

## 2013-01-28 LAB — HEPATIC FUNCTION PANEL
ALT: 18 U/L (ref 0–53)
AST: 13 U/L (ref 0–37)
Albumin: 3.4 g/dL — ABNORMAL LOW (ref 3.5–5.2)
Alkaline Phosphatase: 67 U/L (ref 39–117)
Bilirubin, Direct: <0.1 mg/dL (ref 0.0–0.3)
Total Bilirubin: 0.2 mg/dL — ABNORMAL LOW (ref 0.3–1.2)
Total Protein: 6.3 g/dL (ref 6.0–8.3)

## 2013-01-28 LAB — PREPARE RBC (CROSSMATCH)

## 2013-01-28 LAB — TSH: TSH: 3.24 u[IU]/mL (ref 0.350–4.500)

## 2013-01-28 LAB — MAGNESIUM: Magnesium: 2.1 mg/dL (ref 1.5–2.5)

## 2013-01-28 MED ORDER — POTASSIUM CHLORIDE IN NACL 20-0.9 MEQ/L-% IV SOLN
INTRAVENOUS | Status: DC
  Administered 2013-01-28 – 2013-01-29 (×4): via INTRAVENOUS

## 2013-01-28 MED ORDER — INSULIN ASPART 100 UNIT/ML ~~LOC~~ SOLN: 0.0000 [IU] | Freq: Every day | SUBCUTANEOUS | Status: DC

## 2013-01-28 MED ORDER — INSULIN ASPART 100 UNIT/ML ~~LOC~~ SOLN: 0.0000 [IU] | Freq: Three times a day (TID) | SUBCUTANEOUS | Status: DC

## 2013-01-28 MED ORDER — DIPHENHYDRAMINE HCL 50 MG/ML IJ SOLN: 25.0000 mg | Freq: Once | INTRAMUSCULAR | Status: AC | PRN

## 2013-01-28 MED ORDER — DIPHENHYDRAMINE HCL 25 MG PO CAPS: 25.0000 mg | ORAL_CAPSULE | Freq: Once | ORAL | Status: AC | PRN

## 2013-01-28 MED ORDER — METRONIDAZOLE 500 MG PO TABS
500.0000 mg | ORAL_TABLET | Freq: Three times a day (TID) | ORAL | Status: DC
  Administered 2013-01-28 – 2013-01-29 (×5): 500 mg via ORAL
  Filled 2013-01-28 (×5): qty 1

## 2013-01-28 MED ORDER — HYDROCODONE-ACETAMINOPHEN 5-325 MG PO TABS
1.0000 | ORAL_TABLET | ORAL | Status: DC | PRN
  Administered 2013-01-28: 1 via ORAL
  Administered 2013-01-29: 2 via ORAL
  Filled 2013-01-28: qty 2
  Filled 2013-01-28: qty 1

## 2013-01-28 MED ORDER — CYCLOBENZAPRINE HCL 10 MG PO TABS: 5.0000 mg | ORAL_TABLET | Freq: Three times a day (TID) | ORAL | Status: DC | PRN

## 2013-01-28 MED ORDER — NIACIN ER 500 MG PO CPCR
500.0000 mg | ORAL_CAPSULE | Freq: Every day | ORAL | Status: DC
  Administered 2013-01-28: 500 mg via ORAL
  Filled 2013-01-28 (×2): qty 1

## 2013-01-28 MED ORDER — ACETAMINOPHEN 325 MG PO TABS
650.0000 mg | ORAL_TABLET | ORAL | Status: DC | PRN
  Administered 2013-01-28: 650 mg via ORAL
  Filled 2013-01-28: qty 2

## 2013-01-28 MED ORDER — PANTOPRAZOLE SODIUM 40 MG PO TBEC
40.0000 mg | DELAYED_RELEASE_TABLET | Freq: Two times a day (BID) | ORAL | Status: DC
  Administered 2013-01-28 – 2013-01-29 (×2): 40 mg via ORAL
  Filled 2013-01-28 (×2): qty 1

## 2013-01-28 MED ORDER — DULOXETINE HCL 60 MG PO CPEP
60.0000 mg | ORAL_CAPSULE | Freq: Every day | ORAL | Status: DC
  Administered 2013-01-28 – 2013-01-29 (×2): 60 mg via ORAL
  Filled 2013-01-28 (×2): qty 1

## 2013-01-28 MED ORDER — IOHEXOL 300 MG/ML  SOLN
75.0000 mL | Freq: Once | INTRAMUSCULAR | Status: AC | PRN
  Administered 2013-01-28: 75 mL via INTRAVENOUS

## 2013-01-28 MED ORDER — TRAZODONE HCL 50 MG PO TABS: 50.0000 mg | ORAL_TABLET | Freq: Every evening | ORAL | Status: DC | PRN

## 2013-01-28 MED ORDER — SODIUM CHLORIDE 0.9 % IJ SOLN
3.0000 mL | Freq: Two times a day (BID) | INTRAMUSCULAR | Status: DC
  Administered 2013-01-28: 3 mL via INTRAVENOUS

## 2013-01-28 MED ORDER — RISAQUAD PO CAPS
2.0000 | ORAL_CAPSULE | Freq: Every day | ORAL | Status: DC
  Administered 2013-01-28 – 2013-01-29 (×2): 2 via ORAL
  Filled 2013-01-28 (×2): qty 2

## 2013-01-28 MED ORDER — METRONIDAZOLE 500 MG PO TABS: 500.0000 mg | ORAL_TABLET | Freq: Three times a day (TID) | ORAL | Status: DC

## 2013-01-28 MED ORDER — ONDANSETRON HCL 4 MG/2ML IJ SOLN
4.0000 mg | INTRAMUSCULAR | Status: DC | PRN
  Administered 2013-01-29: 4 mg via INTRAVENOUS
  Filled 2013-01-28: qty 2

## 2013-01-28 MED ORDER — FAMOTIDINE 20 MG PO TABS
20.0000 mg | ORAL_TABLET | Freq: Every day | ORAL | Status: DC
  Administered 2013-01-28: 20 mg via ORAL
  Filled 2013-01-28: qty 1

## 2013-01-28 MED ORDER — AMLODIPINE BESYLATE-VALSARTAN 5-160 MG PO TABS: 1.0000 | ORAL_TABLET | Freq: Two times a day (BID) | ORAL | Status: DC

## 2013-01-28 MED ORDER — ACETAMINOPHEN 325 MG PO TABS: 650.0000 mg | ORAL_TABLET | Freq: Once | ORAL | Status: AC | PRN

## 2013-01-28 MED ORDER — METFORMIN HCL 500 MG PO TABS
500.0000 mg | ORAL_TABLET | Freq: Every day | ORAL | Status: DC
  Filled 2013-01-28: qty 1

## 2013-01-28 MED ORDER — AMITRIPTYLINE HCL 25 MG PO TABS
100.0000 mg | ORAL_TABLET | Freq: Every day | ORAL | Status: DC
  Administered 2013-01-28: 100 mg via ORAL
  Filled 2013-01-28: qty 4

## 2013-01-28 MED ORDER — AMLODIPINE BESYLATE 5 MG PO TABS
5.0000 mg | ORAL_TABLET | Freq: Two times a day (BID) | ORAL | Status: DC
  Administered 2013-01-28 – 2013-01-29 (×3): 5 mg via ORAL
  Filled 2013-01-28 (×3): qty 1

## 2013-01-28 MED ORDER — VITAMIN B-12 1000 MCG PO TABS
1000.0000 ug | ORAL_TABLET | Freq: Two times a day (BID) | ORAL | Status: DC
  Administered 2013-01-28 – 2013-01-29 (×3): 1000 ug via ORAL
  Filled 2013-01-28 (×3): qty 1

## 2013-01-28 MED ORDER — GABAPENTIN 100 MG PO CAPS
100.0000 mg | ORAL_CAPSULE | Freq: Two times a day (BID) | ORAL | Status: DC
  Administered 2013-01-28 – 2013-01-29 (×3): 100 mg via ORAL
  Filled 2013-01-28 (×3): qty 1

## 2013-01-28 MED ORDER — METFORMIN HCL 500 MG PO TABS: 500.0000 mg | ORAL_TABLET | Freq: Every day | ORAL | Status: DC

## 2013-01-28 MED ORDER — IRBESARTAN 150 MG PO TABS
150.0000 mg | ORAL_TABLET | Freq: Two times a day (BID) | ORAL | Status: DC
  Administered 2013-01-28 – 2013-01-29 (×3): 150 mg via ORAL
  Filled 2013-01-28 (×3): qty 1

## 2013-01-28 MED ORDER — FAMOTIDINE 20 MG PO TABS
20.0000 mg | ORAL_TABLET | Freq: Two times a day (BID) | ORAL | Status: DC
  Administered 2013-01-28: 20 mg via ORAL
  Filled 2013-01-28: qty 1

## 2013-01-28 MED ORDER — NIACIN-SIMVASTATIN ER 500-40 MG PO TB24: 1.0000 | ORAL_TABLET | Freq: Every evening | ORAL | Status: DC

## 2013-01-28 MED ORDER — TOPIRAMATE 100 MG PO TABS
100.0000 mg | ORAL_TABLET | Freq: Two times a day (BID) | ORAL | Status: DC
  Administered 2013-01-28 – 2013-01-29 (×3): 100 mg via ORAL
  Filled 2013-01-28 (×5): qty 1

## 2013-01-28 MED ORDER — PNEUMOCOCCAL VAC POLYVALENT 25 MCG/0.5ML IJ INJ
0.5000 mL | INJECTION | INTRAMUSCULAR | Status: DC
  Filled 2013-01-28: qty 0.5

## 2013-01-28 MED ORDER — SIMVASTATIN 20 MG PO TABS
40.0000 mg | ORAL_TABLET | Freq: Every day | ORAL | Status: DC
  Administered 2013-01-28: 40 mg via ORAL
  Filled 2013-01-28: qty 2
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
## 2013-01-28 NOTE — Progress Notes (Signed)
Was called by staff to see patient for neck swelling.   Patient reports that he is experiencing tender/swollen area over right anterior neck.  He denies any shortness of breath, dysphagia or odynophagia.  He reports that this swelling was not present earlier this morning.  On exam, patient has a 2cmx4cm area of swelling/tenderness over left neck.  This is mildly tender to touch.  Airway is intact without any pharyngeal edema appreciated.  No wheezing appreciated on lung exam.  No other palpable masses/lymph nodes were appreciated in neck or sublingual/submandibular region.   It does not appear that this is medication related.  Cause of swelling/tenderness is not clear.  Patient is very concerned about this, so we will obtain a CT of the neck to better evaluate this.  MEMON,JEHANZEB

## 2013-01-28 NOTE — Progress Notes (Signed)
[facility] PROGRESS NOTE  Brian [name] QMV:[id number] DOB: 1956/03/24 DOA: 01/27/2013 PCP: Alleen Borne  Assessment/Plan: 1. Clostridium difficile diarrhea. Patient still tested positive for C. difficile. He's been started on Flagyl. He does not have any fever and WBC count is normal. Will advance diet as tolerated. 2. Rectal bleeding. Possibly related to colitis. GI is following. Hemoglobin is stable. 3. Chronic abdominal pain. Stable. Patient has been referred to a pain clinic. 4. Type 2 diabetes. Stable.  Code Status: full code Family Communication: discussed with patient Disposition Plan: discharge home, possibly tomorrow   Consultants:  Gastroenterology  Procedures:  none  Antibiotics:  Flagyl 5/31  HPI/Subjective: Has chronic periumbilical abdominal pain.  No diarrhea since he has been in the hospital.  No bloody stools.  No vomiting.  Objective: Filed Vitals:   01/28/13 0500 01/28/13 0536 01/28/13 0537 01/28/13 0538  BP:  127/81 135/86 135/77  Pulse:  64 64 65  Temp:  98 F (36.7 C)    TempSrc:  Oral    Resp:  18    Height:   6' (1.829 m)   Weight: 96.752 kg (213 lb 4.8 oz)  96.707 kg (213 lb 3.2 oz)   SpO2:  97%      Intake/Output Summary (Last 24 hours) at 01/28/13 1112 Last data filed at 01/28/13 0530  Gross per 24 hour  Intake  14.58 ml  Output      0 ml  Net  14.58 ml   Filed Weights   01/27/13 2022 01/28/13 0500 01/28/13 0537  Weight: 97.523 kg (215 lb) 96.752 kg (213 lb 4.8 oz) 96.707 kg (213 lb 3.2 oz)    Exam:   General:  NAD  Cardiovascular: s1, s2 rrr  Respiratory: cta b  Abdomen: soft, tender in mid abdomen, bs+  Musculoskeletal: no pedal edema b/l   Data Reviewed: Basic Metabolic Panel:  Recent Labs Lab 01/27/13 2043 01/28/13 0531  NA 141 142  K 3.5 3.7  CL 105 108  CO2 22 24  GLUCOSE 133* 114*  BUN 20 16  CREATININE 1.10 1.08  CALCIUM 8.8 8.0*  MG  --  2.1   Liver Function Tests:  Recent  Labs Lab 01/27/13 2043 01/28/13 0531  AST 18 13  ALT 21 18  ALKPHOS 73 67  BILITOT 0.2* 0.2*  PROT 7.2 6.3  ALBUMIN 4.0 3.4*    Recent Labs Lab 01/27/13 2043  LIPASE 31   No results found for this basename: AMMONIA,  in the last 168 hours CBC:  Recent Labs Lab 01/27/13 2043 01/28/13 0531  WBC 9.0 8.0  NEUTROABS 4.6  --   HGB 13.1 12.5*  HCT 38.1* 36.6*  MCV 95.5 96.6  PLT 218 200   Cardiac Enzymes: No results found for this basename: CKTOTAL, CKMB, CKMBINDEX, TROPONINI,  in the last 168 hours BNP (last 3 results) No results found for this basename: PROBNP,  in the last 8760 hours CBG:  Recent Labs Lab 01/28/13 0738  GLUCAP 92    Recent Results (from the past 240 hour(s))  CLOSTRIDIUM DIFFICILE BY PCR     Status: Abnormal   Collection Time    01/27/13  9:10 PM      Result Value Range Status   C difficile by pcr POSITIVE (*) NEGATIVE Final   Comment: CRITICAL RESULT CALLED TO, READ BACK BY AND VERIFIED WITH:     MCMANUS,DR ON 01/27/13 AT 2250 BY LOY,C     Studies: Ct Abdomen Pelvis W Contrast  01/27/2013   *RADIOLOGY REPORT*  Clinical Data: Diarrhea, bloody stool  CT ABDOMEN AND PELVIS WITH CONTRAST  Technique:  Multidetector CT imaging of the abdomen and pelvis was performed following the standard protocol during bolus administration of intravenous contrast.  Contrast: 50mL OMNIPAQUE IOHEXOL 300 MG/ML  SOLN, OMNIPAQUE IOHEXOL 300 MG/ML  SOLN  Comparison: 01/05/2012  Findings: Limited images through the lung bases demonstrate no significant appreciable abnormality. The heart size is within normal limits. Coronary artery calcification.  No pleural or pericardial effusion.  Unremarkable liver, spleen, pancreas, adrenal glands.  Absent gallbladder.  No biliary ductal dilatation.  There are a couple subcentimeter hypodensities arising from the kidneys bilaterally.  Too small to further characterize.  No hydronephrosis or hydroureter.  Decompressed colon limits  evaluation.  No overt colitis.  Normal appendix.  Normal terminal ileum.  Small bowel loops are normal course and caliber.  No free intraperitoneal air or fluid.  No lymphadenopathy.  Left- sided IVC.  Scattered atherosclerosis of the aorta and branch vessels without aneurysmal dilatation.  Thin-walled bladder.  Upper normal prostate.  No acute osseous finding.  IMPRESSION: No acute abdominopelvic process identified by CT.   Original Report Authenticated By: Jearld Lesch, M.D.    Scheduled Meds: . acidophilus  2 capsule Oral Daily  . amitriptyline  100 mg Oral QHS  . irbesartan  150 mg Oral BID   And  . amLODipine  5 mg Oral BID  . DULoxetine  60 mg Oral Daily  . famotidine  20 mg Oral QHS  . gabapentin  100 mg Oral BID  . insulin aspart  0-5 Units Subcutaneous QHS  . insulin aspart  0-9 Units Subcutaneous TID WC  . [START ON 01/30/2013] metFORMIN  500 mg Oral Q breakfast  . metroNIDAZOLE  500 mg Oral Q8H  . niacin  500 mg Oral q1800   And  . simvastatin  40 mg Oral q1800  . pantoprazole  40 mg Oral BID AC  . [START ON 01/29/2013] pneumococcal 23 valent vaccine  0.5 mL Intramuscular Tomorrow-1000  . sodium chloride  3 mL Intravenous Q12H  . topiramate  100 mg Oral BID  . vitamin B-12  1,000 mcg Oral BID   Continuous Infusions: . 0.9 % NaCl with KCl 20 mEq / L 125 mL/hr at 01/28/13 1610    Active Problems:   GERD (gastroesophageal reflux disease)   Gastroparesis   Clostridium difficile diarrhea   Rectal bleeding   Tobacco abuse   Diabetes type 2, controlled   Lower GI bleed    Time spent:    [facility]  [facility] Pager [phone]. If 7PM-7AM, please contact night-coverage at [email or web address], password [facility] 01/28/2013, 11:12 AM  LOS: 1 day

## 2013-01-28 NOTE — Consult Note (Signed)
Please see dictated note. Patient has C. difficile colitis associated with rectal bleeding. It appears he has stopped bleeding. No further workup at this time unless bleeding recurs. Will advance diet to full liquids.

## 2013-01-28 NOTE — H&P (Addendum)
[facility] History and Physical  Brian [name]  ZOX:[id number]  DOB: 1956/05/11   DOA: 01/27/2013   PCP:   Alleen Borne   Chief Complaint:  Bright red blood per rectum since this morning  HPI: Brian [name] is an 57 y.o. male.   Middle-aged Caucasian gentleman with a Deegan-standing history of GI problems including chronic episodic diarrhea, gastroparesis prior to related to the diabetes, colonic polyps and diverticulosis last colonoscopy some years ago in [place].   Patient reports recurrence of his diarrhea started last night, And then this morning he noticed frank blood in his stool its persistent throat and then for this reason he presents to the emergency room. In the emergency room patient's stool tested positive for C. difficile but because of the large amounts of blood he was referred to the hospitalist service for admission.  He does report being prescribed amoxicillin 4 weeks ago after a dental extraction, and extended course by taking suboptimal dosage over a period of 3 weeks. He does continue to smoke one pack of cigarettes per day on averageof 40 years   Denies NSAID use   .Rewiew of Systems:   All systems negative except as marked bold or noted in the HPI;  Constitutional:    malaise, fever and chills. ;  Eyes:   eye pain, redness and discharge. ;  ENMT:   ear pain, hoarseness, nasal congestion, sinus pressure and sore throat. ;  Cardiovascular:    chest pain, palpitations, diaphoresis, dyspnea and peripheral edema.  Respiratory:   cough, hemoptysis, wheezing and stridor. ;  Gastrointestinal:  nausea, vomiting,  constipation, abdominal pain, melena,  hematemesis, jaundice a. unusual weight loss..   Genitourinary:    frequency, dysuria, incontinence,flank pain and hematuria; Musculoskeletal:   back pain and neck pain.  swelling and trauma.; Recent fracture of the right elbow which he reports Dr. Hilda Lias has told does not require splinting the operation and  will be managed conservatively to heal  Skin: .  pruritus, rash, abrasions, bruising and skin lesion.; ulcerations Neuro:    headache, lightheadedness and neck stiffness.  weakness, altered level of consciousness, altered mental status, extremity weakness, burning feet, involuntary movement, seizure and syncope.  Psych:    anxiety, depression, insomnia, tearfulness, panic attacks, hallucinations, paranoia, suicidal or homicidal ideation   Past Medical History  Diagnosis Date  . DM (diabetes mellitus)   . HTN (hypertension)   . IBS (irritable bowel syndrome)   . GERD (gastroesophageal reflux disease)   . High cholesterol   . Chronic abdominal pain 08/11/11    Reported per patient, Dr. Ralene Cork in [place]  . Adenomatous colon polyp     due for surveillance 2016 per epic notes, performed at outside facility   . Seizures     1 seizure with severe back pain. no further meds or seizures  . Headache(784.0)     migraines  . Gastroparesis   . Chronic diarrhea   . Diverticulosis     Past Surgical History  Procedure Laterality Date  . Tonsillectomy    . Arm surgery      tendon/left  . Esophagogastroduodenoscopy  09/02/10    [facility], Dr. 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 pylori  . Esophagogastroduodenoscopy  02/03/2006    Dr. Steele Berg White-> hiatal hernia, atrial erosions  . Cholecystectomy  11/20/2011  Procedure: LAPAROSCOPIC CHOLECYSTECTOMY;  Surgeon: Fabio Bering, MD;  Location: [facility];  Service: General;  Laterality: N/A;  . Liver biopsy  11/20/2011    Procedure: LIVER BIOPSY;  Surgeon: Fabio Bering, MD;  Location: [facility];  Service: General;  Laterality: N/A;  . Esophagogastroduodenoscopy  01/21/2012    ZOX:[id number] lesion at arytenoid cartilage on the right-likely explains some  of his oro- pharyngeal symptoms/Hiatal hernia/Schatzki's ring s/p dilation, gastric erosions without H.pylori  . Larynx surgery      cyst removed, [facility] [place]  . Vena cava filter placement      Medications:  HOME MEDS: Prior to Admission medications   Medication Sig Start Date End Date Taking? Authorizing Provider  ACETAMINOPHEN-BUTALBITAL (BUPAP) 50-650 MG TABS Take 1 tablet by mouth every 6 (six) hours as needed. For headaches   Yes Historical Provider, MD  amitriptyline (ELAVIL) 100 MG tablet Take 100 mg by mouth at bedtime.   Yes Historical Provider, MD  amLODipine-valsartan (EXFORGE) 5-160 MG per tablet Take 1 tablet by mouth 2 (two) times daily.   Yes Historical Provider, MD  BIOTIN PO Take 10,000 mg by mouth daily.   Yes Historical Provider, MD  cyclobenzaprine (FLEXERIL) 5 MG tablet Take 5 mg by mouth 3 (three) times daily as needed for muscle spasms.   Yes Historical Provider, MD  diphenhydrAMINE (BENADRYL) 25 MG tablet Take 25 mg by mouth 2 (two) times daily.   Yes Historical Provider, MD  DULoxetine (CYMBALTA) 60 MG capsule Take 60 mg by mouth daily.   Yes Historical Provider, MD  gabapentin (NEURONTIN) 100 MG capsule Take 100 mg by mouth 2 (two) times daily.  12/12/12  Yes Historical Provider, MD  metaxalone (SKELAXIN) 800 MG tablet Take 800 mg by mouth daily as needed for pain. For muscle spams   Yes Historical Provider, MD  metFORMIN (GLUCOPHAGE) 500 MG tablet Take 500 mg by mouth every morning.    Yes Historical Provider, MD  MILK THISTLE PO Take 1,000 mg by mouth daily.   Yes Historical Provider, MD  Multiple Vitamin (MULTIVITAMIN) capsule Take 1 capsule by mouth daily.   Yes Historical Provider, MD  Niacin-Simvastatin [facility]) 500-40 MG TB24 Take 1 tablet by mouth every evening.    Yes Historical Provider, MD  pantoprazole (PROTONIX) 40 MG tablet Take 1 tablet (40 mg total) by mouth 2 (two) times daily. 04/07/12  Yes Joselyn Arrow, NP  pioglitazone (ACTOS) 15 MG tablet Take  15 mg by mouth daily.   Yes Historical Provider, MD  promethazine (PHENERGAN) 25 MG tablet Take 25 mg by mouth every 6 (six) hours as needed. For nausea   Yes Historical Provider, MD  ranitidine (ZANTAC) 150 MG tablet Take 150 mg by mouth at bedtime.  11/10/12  Yes Historical Provider, MD  sucralfate (CARAFATE) 1 G tablet Take 1 g by mouth 4 (four) times daily.   Yes Historical Provider, MD  topiramate (TOPAMAX) 100 MG tablet Take 100 mg by mouth 2 (two) times daily.   Yes Historical Provider, MD  traMADol (ULTRAM) 50 MG tablet Take 50 mg by mouth every 6 (six) hours as needed for pain.   Yes Historical Provider, MD  vitamin B-12 (CYANOCOBALAMIN) 1000 MCG tablet Take 1,000 mcg by mouth 2 (two) times daily.   Yes Historical Provider, MD  vitamin E 400 UNIT capsule Take 400 Units by mouth 2 (two) times daily.   Yes Historical Provider, MD  clidinium-chlordiazePOXIDE (LIBRAX) 2.5-5 MG per capsule Take 1 capsule by mouth 3 (  three) times daily as needed. For stomach    Historical Provider, MD  ondansetron (ZOFRAN) 4 MG tablet Take 1 tablet (4 mg total) by mouth 2 (two) times daily. 10/24/12   Nira Retort, NP     Allergies:  Allergies  Allergen Reactions  . Ketorolac Tromethamine     Renal failure  . Sulfamethoxazole Rash  . Tetracyclines & Related Rash    Social History:   reports that he has been smoking Cigarettes.  He has a 42 pack-year smoking history. He does not have any smokeless tobacco history on file. He reports that  drinks alcohol. He reports that he does not use illicit drugs.  Family History: Family History  Problem Relation Age of Onset  . Cancer Father 40    Gallbladder  . Anesthesia problems Neg Hx   . Hypotension Neg Hx   . Malignant hyperthermia Neg Hx   . Pseudochol deficiency Neg Hx      Physical Exam: Filed Vitals:   01/27/13 2022 01/27/13 2339  BP: 148/76 131/77  Pulse: 85 77  Temp: 98.6 F (37 C) 97.8 F (36.6 C)  TempSrc: Oral Oral  Resp: 16 16   Height: 6' (1.829 m)   Weight: 97.523 kg (215 lb)   SpO2: 99% 96%   Blood pressure 131/77, pulse 77, temperature 97.8 F (36.6 C), temperature source Oral, resp. rate 16, height 6' (1.829 m), weight 97.523 kg (215 lb), SpO2 96.00%.  GEN:  Pleasant middle-aged Caucasian gentleman  lying bed in no acute distress; cooperative with exam PSYCH:  alert and oriented x4;   night anxious nor depressed; affect is appropriate. HEENT: Mucous membranes pink and anicteric; PERRLA; EOM intact; no cervical lymphadenopathy nor thyromegaly or carotid bruit; no JVD; Breasts:: Not examined CHEST WALL: No tenderness CHEST: Normal respiration, clear to auscultation bilaterally HEART: Regular rate and rhythm; no murmurs rubs or gallops BACK: No kyphosis no scoliosis; no CVA tenderness ABDOMEN: Obese, soft non-tender; no masses, no organomegaly, normal abdominal bowel sounds; no pannus; no intertriginous candida. Rectal Exam: Not done EXTREMITIES:  age-appropriate arthropathy of the hands and knees; tender right elbow, status post recent fracture; no edema; no ulcerations. Genitalia: not examined PULSES: 2+ and symmetric SKIN: Normal hydration no rash or ulceration CNS: Cranial nerves 2-12 grossly intact no focal lateralizing neurologic deficit   Labs on Admission:  Basic Metabolic Panel:  Recent Labs Lab 01/27/13 2043  NA 141  K 3.5  CL 105  CO2 22  GLUCOSE 133*  BUN 20  CREATININE 1.10  CALCIUM 8.8   Liver Function Tests:  Recent Labs Lab 01/27/13 2043  AST 18  ALT 21  ALKPHOS 73  BILITOT 0.2*  PROT 7.2  ALBUMIN 4.0    Recent Labs Lab 01/27/13 2043  LIPASE 31   No results found for this basename: AMMONIA,  in the last 168 hours CBC:  Recent Labs Lab 01/27/13 2043  WBC 9.0  NEUTROABS 4.6  HGB 13.1  HCT 38.1*  MCV 95.5  PLT 218   Cardiac Enzymes: No results found for this basename: CKTOTAL, CKMB, CKMBINDEX, TROPONINI,  in the last 168 hours BNP: No components found  with this basename: POCBNP,  D-dimer: No components found with this basename: D-DIMER,  CBG: No results found for this basename: GLUCAP,  in the last 168 hours  Radiological Exams on Admission: Ct Abdomen Pelvis W Contrast  01/27/2013   *RADIOLOGY REPORT*  Clinical Data: Diarrhea, bloody stool  CT ABDOMEN AND PELVIS WITH CONTRAST  Technique:  Multidetector CT imaging of the abdomen and pelvis was performed following the standard protocol during bolus administration of intravenous contrast.  Contrast: 50mL OMNIPAQUE IOHEXOL 300 MG/ML  SOLN, OMNIPAQUE IOHEXOL 300 MG/ML  SOLN  Comparison: 01/05/2012  Findings: Limited images through the lung bases demonstrate no significant appreciable abnormality. The heart size is within normal limits. Coronary artery calcification.  No pleural or pericardial effusion.  Unremarkable liver, spleen, pancreas, adrenal glands.  Absent gallbladder.  No biliary ductal dilatation.  There are a couple subcentimeter hypodensities arising from the kidneys bilaterally.  Too small to further characterize.  No hydronephrosis or hydroureter.  Decompressed colon limits evaluation.  No overt colitis.  Normal appendix.  Normal terminal ileum.  Small bowel loops are normal course and caliber.  No free intraperitoneal air or fluid.  No lymphadenopathy.  Left- sided IVC.  Scattered atherosclerosis of the aorta and branch vessels without aneurysmal dilatation.  Thin-walled bladder.  Upper normal prostate.  No acute osseous finding.  IMPRESSION: No acute abdominopelvic process identified by CT.   Original Report Authenticated By: Jearld Lesch, M.D.      Assessment/Plan   Active Problems:     Clostridium difficile diarrhea   Rectal bleeding   Tobacco abuseGERD (gastroesophageal reflux disease)   Gastroparesis     Diabetes type 2, controlled   Lower GI bleed  PLAN: We'll admit this gentleman for hydration and to initiate treatment for C. Difficile; he is well known to a  local gastroenterologist but has not had a colonoscopy since relocating to this area. We will consult gastroenterologist to assist with inpatient or outpatient management   Counsel on antibiotic use and abuse  Monitor hemoglobin and transfuse as necessary  Again counseled on nicotine cessation; she declines a nicotine . Continue outpatient management of diabetes and a sliding scale  Other plans as per orders.  Code Status: FULL CODE   Family Communication: Plans discussed with patient at bedside  Disposition Plan: Likely home in the 2 or 3 days or earlier depending on the GI service    CAMPBELL,LEOPOLD Nocturnist [facility] Pager [phone]   01/28/2013, 12:44 AM

## 2013-01-29 LAB — CBC
HCT: 38.3 % — ABNORMAL LOW (ref 39.0–52.0)
Hemoglobin: 13 g/dL (ref 13.0–17.0)
MCH: 32.3 pg (ref 26.0–34.0)
MCHC: 33.9 g/dL (ref 30.0–36.0)
MCV: 95.3 fL (ref 78.0–100.0)
Platelets: 202 10*3/uL (ref 150–400)
RBC: 4.02 MIL/uL — ABNORMAL LOW (ref 4.22–5.81)
RDW: 13 % (ref 11.5–15.5)
WBC: 7.5 10*3/uL (ref 4.0–10.5)

## 2013-01-29 LAB — GLUCOSE, CAPILLARY
Glucose-Capillary: 100 mg/dL — ABNORMAL HIGH (ref 70–99)
Glucose-Capillary: 99 mg/dL (ref 70–99)

## 2013-01-29 LAB — URINE CULTURE
Colony Count: NO GROWTH
Culture: NO GROWTH

## 2013-01-29 MED ORDER — METFORMIN HCL 500 MG PO TABS: 500.0000 mg | ORAL_TABLET | Freq: Every morning | ORAL | Status: DC

## 2013-01-29 MED ORDER — METRONIDAZOLE 500 MG PO TABS: 500.0000 mg | ORAL_TABLET | Freq: Three times a day (TID) | ORAL | Status: DC

## 2013-01-29 NOTE — Progress Notes (Signed)
D.c instructions reviewed with patient.  Verbalized understanding.  Pt dc'd to home with family. Sch[place], Candelaria Stagers 01/29/2013

## 2013-01-29 NOTE — Progress Notes (Signed)
Subjective; Patient states he's hungry. Since last seen he had one bowel movement this morning and he did notice bright red blood which she describes to be large amount. Midabdominal pain is unchanged. Right-sided neck pain has improved. He developed pain and swelling on right side of neck yesterday afternoon which was evaluated CT. He denies shortness of breath. Objective; BP 135/89  Pulse 70  Temp(Src) 97.6 F (36.4 C) (Oral)  Resp 18  Ht 6' (1.829 m)  Wt 213 lb 3.2 oz (96.707 kg)  BMI 28.91 kg/m2  SpO2 100% Patient is alert and appears to be comfortable. Oropharyngeal mucosa is normal. His neck is asymmetric with some swelling involving the proximal aspect of right sternocleidomastoid. No adenopathy noted. Abdomen is soft with (umbilicus tenderness. Organomegaly or masses noted. Lab data; WBC 7.5. H&H 13 and 38.3 Platelet count 202K Assessment; #1. C. difficile colitis. He appears to be improving. He still passing bright red blood per rectum but his H&H remained stable. #2. Neck pain and swelling appears to be secondary to right sternocleidomastoid spasm. No significant abnormality noted on neck CT. Recommendations; Advance diet to low-residue diet.         Recommendations; Advance diet.

## 2013-01-29 NOTE — Discharge Summary (Signed)
Physician Discharge Summary  Brian [name]:[id number] DOB: 1955-11-02 DOA: 01/27/2013  PCP: Alleen Borne  Admit date: 01/27/2013 Discharge date: 01/29/2013  Time spent:  Recommendations for Outpatient Follow-up:  1. Follow up with primary care doctor in 2 weeks  Discharge Diagnoses:  Active Problems:   GERD (gastroesophageal reflux disease)   Gastroparesis   Clostridium difficile diarrhea   Rectal bleeding   Tobacco abuse   Diabetes type 2, controlled   Lower GI bleed   Discharge Condition: improved  Diet recommendation: low salt, low carb  Filed Weights   01/27/13 2022 01/28/13 0500 01/28/13 0537  Weight: 97.523 kg (215 lb) 96.752 kg (213 lb 4.8 oz) 96.707 kg (213 lb 3.2 oz)    History of present illness:  Brian [name] is an 57 y.o. male. Middle-aged Caucasian gentleman with a [place]-standing history of GI problems including chronic episodic diarrhea, gastroparesis prior to related to the diabetes, colonic polyps and diverticulosis last colonoscopy some years ago in [place].  Patient reports recurrence of his diarrhea started last night,  And then this morning he noticed frank blood in his stool its persistent throat and then for this reason he presents to the emergency room. In the emergency room patient's stool tested positive for C. difficile but because of the large amounts of blood he was referred to the hospitalist service for admission.  He does report being prescribed amoxicillin 4 weeks ago after a dental extraction, and extended course by taking suboptimal dosage over a period of 3 weeks. He does continue to smoke one pack of cigarettes per day on averageof 40 years  Denies NSAID use   Hospital Course:  This patient was admitted to the hospital with rectal bleeding and loose stools. He is to evaluate the emergency room and was found to have Clostridium difficile diarrhea. Patient was admitted to the hospital for further evaluation. He was started  on oral metronidazole. He was seen by gastroenterology for rectal bleeding. Patient's hemoglobin remained stable. It was not felt the patient required a colonoscopy at this time. His bleeding may possibly due to underlying colitis. His rectal bleeding has since improved. His last bowel movement did not contain any blood. Hemoglobin is currently 13. He is recommended to followup with his primary care physician/gastroenterologist as an outpatient. The patient did complain of neck pain during this admission. He was noted to have an area of swelling/tenderness in his right neck. He underwent CT scan of the neck which did not show any acute findings. This subsequently is spontaneously resolved. He was noted to be over his sternocleidomastoid which could possibly be a muscle spasm. He reports his symptoms are improving. He was advised that if his neck pain does get worse, it may be reasonable to see an ENT specialist.  Procedures:  none  Consultations:  Gastroenterology, Dr. Karilyn Cota  Discharge Exam: Filed Vitals:   01/29/13 0221 01/29/13 0450 01/29/13 0451 01/29/13 0453  BP: 147/85 128/64 141/88 135/89  Pulse: 63 64 72 70  Temp:  97.6 F (36.4 C)    TempSrc:  Oral    Resp: 18 18 18 18   Height:      Weight:      SpO2: 96% 99% 99% 100%    General: NAD Cardiovascular: S1, S2, RRR Respiratory: CTA B  Discharge Instructions  Discharge Orders   Future Orders Complete By Expires     Call MD for:  severe uncontrolled pain  As directed     Call MD for:  temperature >100.4  As directed     Call MD for:  As directed     Comments:      Recurrent bleeding    Diet - low sodium heart healthy  As directed     Increase activity slowly  As directed         Medication List    TAKE these medications       amitriptyline 100 MG tablet  Commonly known as:  ELAVIL  Take 100 mg by mouth at bedtime.     amLODipine-valsartan 5-160 MG per tablet  Commonly known as:  EXFORGE  Take 1 tablet by mouth 2  (two) times daily.     BIOTIN PO  Take 10,000 mg by mouth daily.     BUPAP 50-650 MG Tabs  Generic drug:  ACETAMINOPHEN-BUTALBITAL  Take 1 tablet by mouth every 6 (six) hours as needed. For headaches     clidinium-chlordiazePOXIDE 2.5-5 MG per capsule  Commonly known as:  LIBRAX  Take 1 capsule by mouth 3 (three) times daily as needed. For stomach     cyclobenzaprine 5 MG tablet  Commonly known as:  FLEXERIL  Take 5 mg by mouth 3 (three) times daily as needed for muscle spasms.     diphenhydrAMINE 25 MG tablet  Commonly known as:  BENADRYL  Take 25 mg by mouth 2 (two) times daily.     DULoxetine 60 MG capsule  Commonly known as:  CYMBALTA  Take 60 mg by mouth daily.     gabapentin 100 MG capsule  Commonly known as:  NEURONTIN  Take 100 mg by mouth 2 (two) times daily.     metaxalone 800 MG tablet  Commonly known as:  SKELAXIN  Take 800 mg by mouth daily as needed for pain. For muscle spams     metFORMIN 500 MG tablet  Commonly known as:  GLUCOPHAGE  Take 1 tablet (500 mg total) by mouth every morning. Restart on 6/2     metroNIDAZOLE 500 MG tablet  Commonly known as:  FLAGYL  Take 1 tablet (500 mg total) by mouth every 8 (eight) hours.     MILK THISTLE PO  Take 1,000 mg by mouth daily.     multivitamin capsule  Take 1 capsule by mouth daily.     ondansetron 4 MG tablet  Commonly known as:  ZOFRAN  Take 1 tablet (4 mg total) by mouth 2 (two) times daily.     pantoprazole 40 MG tablet  Commonly known as:  PROTONIX  Take 1 tablet (40 mg total) by mouth 2 (two) times daily.     pioglitazone 15 MG tablet  Commonly known as:  ACTOS  Take 15 mg by mouth daily.     promethazine 25 MG tablet  Commonly known as:  PHENERGAN  Take 25 mg by mouth every 6 (six) hours as needed. For nausea     ranitidine 150 MG tablet  Commonly known as:  ZANTAC  Take 150 mg by mouth at bedtime.     SIMCOR 500-40 MG Tb24  Generic drug:  Niacin-Simvastatin  Take 1 tablet by mouth  every evening.     sucralfate 1 G tablet  Commonly known as:  CARAFATE  Take 1 g by mouth 4 (four) times daily.     topiramate 100 MG tablet  Commonly known as:  TOPAMAX  Take 100 mg by mouth 2 (two) times daily.     traMADol 50 MG tablet  Commonly known as:  ULTRAM  Take 50 mg by mouth every 6 (six) hours as needed for pain.     vitamin B-12 1000 MCG tablet  Commonly known as:  CYANOCOBALAMIN  Take 1,000 mcg by mouth 2 (two) times daily.     vitamin E 400 UNIT capsule  Take 400 Units by mouth 2 (two) times daily.       Allergies  Allergen Reactions  . Ketorolac Tromethamine     Renal failure  . Sulfamethoxazole Rash  . Tetracyclines & Related Rash       Follow-up Information   Follow up with CLAGGETT,ELIN, PA-C. Schedule an appointment as soon as possible for a visit in 2 weeks.   Contact information:   [address] [place] [address] [phone]        The results of significant diagnostics from this hospitalization (including imaging, microbiology, ancillary and laboratory) are listed below for reference.    Significant Diagnostic Studies: Ct Soft Tissue Neck W Contrast  01/28/2013   *RADIOLOGY REPORT*  Clinical Data: Right-sided neck swelling.  CT NECK WITH CONTRAST  Technique:  Multidetector CT imaging of the neck was performed with intravenous contrast.  Contrast: 75mL OMNIPAQUE IOHEXOL 300 MG/ML  SOLN  Comparison: None.  Findings: No significant mucosal or submucosal lesion is evident. There is slight asymmetry of the lingual tonsils on the right. This area should be amendable to direct visualization.  This partially fills in the vallecula.  The vocal cords are midline and symmetric.  Bilateral level II lymph nodes appear reactive.  No pathologic nodes are evident in the neck.  Minimal atherosclerotic calcifications are noted at the right carotid bifurcation and aortic arch.  The thyroid is within normal limits.  The lung apices demonstrate mild  ground-glass attenuation, likely representing atelectasis or edema.  The bone windows reveal endplate degenerative changes, most evident at C5-6 and C6-7, worse on the left.  No significant focal soft tissue mass or muscle asymmetry is evident.  The salivary glands are symmetric and within normal limits.  IMPRESSION:  1.  No acute or focal abnormality to explain the patient's sensation. 2.  Mild asymmetry of the lingual tonsils.  This area should be amenable to direct visualization to exclude neoplasm. 3.  Ground-glass attenuation at the lung apices bilaterally likely represents atelectasis in mild edema.  No focal airspace disease or nodule is evident.   Original Report Authenticated By: Marin Roberts, M.D.   Ct Abdomen Pelvis W Contrast  01/27/2013   *RADIOLOGY REPORT*  Clinical Data: Diarrhea, bloody stool  CT ABDOMEN AND PELVIS WITH CONTRAST  Technique:  Multidetector CT imaging of the abdomen and pelvis was performed following the standard protocol during bolus administration of intravenous contrast.  Contrast: 50mL OMNIPAQUE IOHEXOL 300 MG/ML  SOLN, OMNIPAQUE IOHEXOL 300 MG/ML  SOLN  Comparison: 01/05/2012  Findings: Limited images through the lung bases demonstrate no significant appreciable abnormality. The heart size is within normal limits. Coronary artery calcification.  No pleural or pericardial effusion.  Unremarkable liver, spleen, pancreas, adrenal glands.  Absent gallbladder.  No biliary ductal dilatation.  There are a couple subcentimeter hypodensities arising from the kidneys bilaterally.  Too small to further characterize.  No hydronephrosis or hydroureter.  Decompressed colon limits evaluation.  No overt colitis.  Normal appendix.  Normal terminal ileum.  Small bowel loops are normal course and caliber.  No free intraperitoneal air or fluid.  No lymphadenopathy.  Left- sided IVC.  Scattered atherosclerosis of the aorta and branch vessels  without aneurysmal dilatation.  Thin-walled  bladder.  Upper normal prostate.  No acute osseous finding.  IMPRESSION: No acute abdominopelvic process identified by CT.   Original Report Authenticated By: Jearld Lesch, M.D.    Microbiology: Recent Results (from the past 240 hour(s))  CLOSTRIDIUM DIFFICILE BY PCR     Status: Abnormal   Collection Time    01/27/13  9:10 PM      Result Value Range Status   C difficile by pcr POSITIVE (*) NEGATIVE Final   Comment: CRITICAL RESULT CALLED TO, READ BACK BY AND VERIFIED WITH:     MCMANUS,DR ON 01/27/13 AT 2250 BY LOY,C     Labs: Basic Metabolic Panel:  Recent Labs Lab 01/27/13 2043 01/28/13 0531  NA 141 142  K 3.5 3.7  CL 105 108  CO2 22 24  GLUCOSE 133* 114*  BUN 20 16  CREATININE 1.10 1.08  CALCIUM 8.8 8.0*  MG  --  2.1   Liver Function Tests:  Recent Labs Lab 01/27/13 2043 01/28/13 0531  AST 18 13  ALT 21 18  ALKPHOS 73 67  BILITOT 0.2* 0.2*  PROT 7.2 6.3  ALBUMIN 4.0 3.4*    Recent Labs Lab 01/27/13 2043  LIPASE 31   No results found for this basename: AMMONIA,  in the last 168 hours CBC:  Recent Labs Lab 01/27/13 2043 01/28/13 0531 01/29/13 0449  WBC 9.0 8.0 7.5  NEUTROABS 4.6  --   --   HGB 13.1 12.5* 13.0  HCT 38.1* 36.6* 38.3*  MCV 95.5 96.6 95.3  PLT 218 200 202   Cardiac Enzymes: No results found for this basename: CKTOTAL, CKMB, CKMBINDEX, TROPONINI,  in the last 168 hours BNP: BNP (last 3 results) No results found for this basename: PROBNP,  in the last 8760 hours CBG:  Recent Labs Lab 01/28/13 1140 01/28/13 1631 01/28/13 2025 01/29/13 0755 01/29/13 1148  GLUCAP 111* 74 114* 100* 99       Signed:  MEMON,JEHANZEB  [facility] 01/29/2013, 2:57 PM

## 2013-01-30 LAB — OCCULT BLOOD, POC DEVICE: Fecal Occult Bld: POSITIVE — AB

## 2013-02-01 LAB — TYPE AND SCREEN
ABO/RH(D): A POS
Antibody Screen: NEGATIVE
Unit division: 0
Unit division: 0

## 2013-02-01 LAB — STOOL CULTURE

## 2013-02-02 NOTE — Progress Notes (Signed)
UR chart review completed.  

## 2013-03-15 NOTE — Progress Notes (Signed)
REVIEWED.  

## 2013-04-06 ENCOUNTER — Other Ambulatory Visit: Payer: Self-pay

## 2013-04-06 MED ORDER — ONDANSETRON HCL 4 MG PO TABS: 4.0000 mg | ORAL_TABLET | Freq: Two times a day (BID) | ORAL | Status: DC

## 2013-04-07 ENCOUNTER — Ambulatory Visit (INDEPENDENT_AMBULATORY_CARE_PROVIDER_SITE_OTHER): Payer: Medicare HMO | Admitting: Internal Medicine

## 2013-04-07 ENCOUNTER — Encounter: Payer: Self-pay | Admitting: Internal Medicine

## 2013-04-07 VITALS — BP 108/69 | HR 75 | Temp 97.6°F | Ht 72.0 in | Wt 213.4 lb

## 2013-04-07 DIAGNOSIS — R109 Unspecified abdominal pain: Secondary | ICD-10-CM

## 2013-04-07 DIAGNOSIS — Z860100 Personal history of colon polyps, unspecified: Secondary | ICD-10-CM

## 2013-04-07 DIAGNOSIS — K219 Gastro-esophageal reflux disease without esophagitis: Secondary | ICD-10-CM

## 2013-04-07 MED ORDER — PEG-KCL-NACL-NASULF-NA ASC-C 100 G PO SOLR: 1.0000 | ORAL | Status: DC

## 2013-04-07 MED ORDER — ONDANSETRON HCL 4 MG PO TABS: 4.0000 mg | ORAL_TABLET | Freq: Three times a day (TID) | ORAL | Status: DC | PRN
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
## 2013-04-07 NOTE — Patient Instructions (Addendum)
Plan for a colonoscopy under propofol - hx of polyps  Will need to days of clear liquids and split Movie prep  We will refill your Zofran

## 2013-04-10 ENCOUNTER — Encounter (HOSPITAL_COMMUNITY)
Admission: RE | Admit: 2013-04-10 | Discharge: 2013-04-10 | Disposition: A | Discharge status: Home / Self Care | Payer: Medicare HMO | Source: Ambulatory Visit | Attending: Internal Medicine | Admitting: Internal Medicine

## 2013-04-10 ENCOUNTER — Encounter (HOSPITAL_COMMUNITY): Payer: Self-pay | Admitting: Pharmacy Technician

## 2013-04-10 ENCOUNTER — Encounter (HOSPITAL_COMMUNITY): Payer: Self-pay

## 2013-04-10 HISTORY — DX: Major depressive disorder, single episode, unspecified: F32.9

## 2013-04-10 HISTORY — DX: Depression, unspecified: F32.A

## 2013-04-10 HISTORY — DX: Sleep apnea, unspecified: G47.30

## 2013-04-10 HISTORY — DX: Anxiety disorder, unspecified: F41.9

## 2013-04-10 LAB — BASIC METABOLIC PANEL
BUN: 15 mg/dL (ref 6–23)
CO2: 25 mEq/L (ref 19–32)
Calcium: 9.1 mg/dL (ref 8.4–10.5)
Chloride: 106 mEq/L (ref 96–112)
Creatinine, Ser: 1.28 mg/dL (ref 0.50–1.35)
GFR calc Af Amer: 70 mL/min — ABNORMAL LOW (ref >90)
GFR calc non Af Amer: 61 mL/min — ABNORMAL LOW (ref >90)
Glucose, Bld: 112 mg/dL — ABNORMAL HIGH (ref 70–99)
Potassium: 3.8 mEq/L (ref 3.5–5.1)
Sodium: 140 mEq/L (ref 135–145)

## 2013-04-10 LAB — HEMOGLOBIN AND HEMATOCRIT, BLOOD
HCT: 38 % — ABNORMAL LOW (ref 39.0–52.0)
Hemoglobin: 13 g/dL (ref 13.0–17.0)

## 2013-04-10 NOTE — Patient Instructions (Addendum)
Brecken Dewoody Preziosi  04/10/2013   Your procedure is scheduled on:  04/13/2013  Report to [facility] at  615  AM.  Call this number if you have problems the morning of surgery: [phone]   Remember:   Do not eat food or drink liquids after midnight.   Take these medicines the morning of surgery with A SIP OF WATER: elavil, exforge, flexaril, bentyl, benadryl,cymbalta, neurontin, skelaxin, reglan, zofran, zantac, phenergan, topamax   Do not wear jewelry, make-up or nail polish.  Do not wear lotions, powders, or perfumes.   Do not shave 48 hours prior to surgery. Men may shave face and neck.  Do not bring valuables to the hospital.  [facility] is not responsible for any belongings or valuables.  Contacts, dentures or bridgework may not be worn into surgery.  Leave suitcase in the car. After surgery it may be brought to your room.  For patients admitted to the hospital, checkout time is 11:00 AM the day of discharge.   Patients discharged the day of surgery will not be allowed to drive home.  Name and phone number of your driver: family  Special Instructions: N/A   Please read over the following fact sheets that you were given: Pain Booklet, Coughing and Deep Breathing, Surgical Site Infection Prevention, Anesthesia Post-op Instructions and Care and Recovery After Surgery PATIENT INSTRUCTIONS POST-ANESTHESIA  IMMEDIATELY FOLLOWING SURGERY:  Do not drive or operate machinery for the first twenty four hours after surgery.  Do not make any important decisions for twenty four hours after surgery or while taking narcotic pain medications or sedatives.  If you develop intractable nausea and vomiting or a severe headache please notify your doctor immediately.  FOLLOW-UP:  Please make an appointment with your surgeon as instructed. You do not need to follow up with anesthesia unless specifically instructed to do so.  WOUND CARE INSTRUCTIONS (if applicable):  Keep a dry clean dressing on the  anesthesia/puncture wound site if there is drainage.  Once the wound has quit draining you may leave it open to air.  Generally you should leave the bandage intact for twenty four hours unless there is drainage.  If the epidural site drains for more than 36-48 hours please call the anesthesia department.  QUESTIONS?:  Please feel free to call your physician or the hospital operator if you have any questions, and they will be happy to assist you.      Colonoscopy A colonoscopy is an exam to evaluate your entire colon. In this exam, your colon is cleansed. A Colvard fiberoptic tube is inserted through your rectum and into your colon. The fiberoptic scope (endoscope) is a Kunkler bundle of enclosed and very flexible fibers. These fibers transmit light to the area examined and send images from that area to your caregiver. Discomfort is usually minimal. You may be given a drug to help you sleep (sedative) during or prior to the procedure. This exam helps to detect lumps (tumors), polyps, inflammation, and areas of bleeding. Your caregiver may also take a small piece of tissue (biopsy) that will be examined under a microscope. LET YOUR CAREGIVER KNOW ABOUT:   Allergies to food or medicine.  Medicines taken, including vitamins, herbs, eyedrops, over-the-counter medicines, and creams.  Use of steroids (by mouth or creams).  Previous problems with anesthetics or numbing medicines.  History of bleeding problems or blood clots.  Previous surgery.  Other health problems, including diabetes and kidney problems.  Possibility of pregnancy,  if this applies. BEFORE THE PROCEDURE   A clear liquid diet may be required for 2 days before the exam.  Ask your caregiver about changing or stopping your regular medications.  Liquid injections (enemas) or laxatives may be required.  A large amount of electrolyte solution may be given to you to drink over a short period of time. This solution is used to clean out your  colon.  You should be present 60 minutes prior to your procedure or as directed by your caregiver. AFTER THE PROCEDURE   If you received a sedative or pain relieving medication, you will need to arrange for someone to drive you home.  Occasionally, there is a little blood passed with the first bowel movement. Do not be concerned. FINDING OUT THE RESULTS OF YOUR TEST Not all test results are available during your visit. If your test results are not back during the visit, make an appointment with your caregiver to find out the results. Do not assume everything is normal if you have not heard from your caregiver or the medical facility. It is important for you to follow up on all of your test results. HOME CARE INSTRUCTIONS   It is not unusual to pass moderate amounts of gas and experience mild abdominal cramping following the procedure. This is due to air being used to inflate your colon during the exam. Walking or a warm pack on your belly (abdomen) may help.  You may resume all normal meals and activities after sedatives and medicines have worn off.  Only take over-the-counter or prescription medicines for pain, discomfort, or fever as directed by your caregiver. Do not use aspirin or blood thinners if a biopsy was taken. Consult your caregiver for medicine usage if biopsies were taken. SEEK IMMEDIATE MEDICAL CARE IF:   You have a fever.  You pass large blood clots or fill a toilet with blood following the procedure. This may also occur 10 to 14 days following the procedure. This is more likely if a biopsy was taken.  You develop abdominal pain that keeps getting worse and cannot be relieved with medicine. Document Released: 08/14/2000 Document Revised: 11/09/2011 Document Reviewed: 03/29/2008 [facility] Patient Information 2014 [place], [place].

## 2013-04-13 ENCOUNTER — Encounter (HOSPITAL_COMMUNITY)
Admission: RE | Disposition: A | Discharge status: Home / Self Care | Payer: Self-pay | Source: Ambulatory Visit | Attending: Internal Medicine

## 2013-04-13 ENCOUNTER — Ambulatory Visit (HOSPITAL_COMMUNITY)
Admission: RE | Admit: 2013-04-13 | Discharge: 2013-04-13 | Disposition: A | Discharge status: Home / Self Care | Payer: Medicare HMO | Source: Ambulatory Visit | Attending: Internal Medicine | Admitting: Internal Medicine

## 2013-04-13 ENCOUNTER — Encounter (HOSPITAL_COMMUNITY): Payer: Self-pay | Admitting: Anesthesiology

## 2013-04-13 ENCOUNTER — Encounter (HOSPITAL_COMMUNITY): Payer: Self-pay | Admitting: *Deleted

## 2013-04-13 ENCOUNTER — Ambulatory Visit (HOSPITAL_COMMUNITY): Payer: Medicare HMO | Admitting: Anesthesiology

## 2013-04-13 DIAGNOSIS — E119 Type 2 diabetes mellitus without complications: Secondary | ICD-10-CM | POA: Insufficient documentation

## 2013-04-13 DIAGNOSIS — Z860100 Personal history of colon polyps, unspecified: Secondary | ICD-10-CM | POA: Insufficient documentation

## 2013-04-13 DIAGNOSIS — I10 Essential (primary) hypertension: Secondary | ICD-10-CM | POA: Insufficient documentation

## 2013-04-13 DIAGNOSIS — K648 Other hemorrhoids: Secondary | ICD-10-CM | POA: Insufficient documentation

## 2013-04-13 DIAGNOSIS — Z01810 Encounter for preprocedural cardiovascular examination: Secondary | ICD-10-CM | POA: Insufficient documentation

## 2013-04-13 DIAGNOSIS — Z1211 Encounter for screening for malignant neoplasm of colon: Secondary | ICD-10-CM

## 2013-04-13 DIAGNOSIS — K5730 Diverticulosis of large intestine without perforation or abscess without bleeding: Secondary | ICD-10-CM | POA: Insufficient documentation

## 2013-04-13 DIAGNOSIS — D126 Benign neoplasm of colon, unspecified: Secondary | ICD-10-CM

## 2013-04-13 DIAGNOSIS — Z01812 Encounter for preprocedural laboratory examination: Secondary | ICD-10-CM | POA: Insufficient documentation

## 2013-04-13 HISTORY — PX: COLONOSCOPY WITH PROPOFOL: SHX5780

## 2013-04-13 LAB — GLUCOSE, CAPILLARY: Glucose-Capillary: 113 mg/dL — ABNORMAL HIGH (ref 70–99)

## 2013-04-13 SURGERY — COLONOSCOPY WITH PROPOFOL
Anesthesia: Monitor Anesthesia Care

## 2013-04-13 MED ORDER — GLYCOPYRROLATE 0.2 MG/ML IJ SOLN
INTRAMUSCULAR | Status: AC
  Filled 2013-04-13: qty 1

## 2013-04-13 MED ORDER — FENTANYL CITRATE 0.05 MG/ML IJ SOLN
INTRAMUSCULAR | Status: AC
  Filled 2013-04-13: qty 2

## 2013-04-13 MED ORDER — LACTATED RINGERS IV SOLN
INTRAVENOUS | Status: DC | PRN
  Administered 2013-04-13: 07:00:00 via INTRAVENOUS

## 2013-04-13 MED ORDER — PROPOFOL 10 MG/ML IV EMUL
INTRAVENOUS | Status: AC
  Filled 2013-04-13: qty 20

## 2013-04-13 MED ORDER — MIDAZOLAM HCL 2 MG/2ML IJ SOLN
INTRAMUSCULAR | Status: AC
  Filled 2013-04-13: qty 4

## 2013-04-13 MED ORDER — MIDAZOLAM HCL 2 MG/2ML IJ SOLN
1.0000 mg | INTRAMUSCULAR | Status: DC | PRN
  Administered 2013-04-13: 2 mg via INTRAVENOUS

## 2013-04-13 MED ORDER — ONDANSETRON HCL 4 MG/2ML IJ SOLN
INTRAMUSCULAR | Status: AC
  Filled 2013-04-13: qty 2

## 2013-04-13 MED ORDER — ONDANSETRON HCL 4 MG/2ML IJ SOLN: 4.0000 mg | Freq: Once | INTRAMUSCULAR | Status: DC | PRN

## 2013-04-13 MED ORDER — LACTATED RINGERS IV SOLN
INTRAVENOUS | Status: DC
  Administered 2013-04-13: 08:00:00 via INTRAVENOUS

## 2013-04-13 MED ORDER — STERILE WATER FOR IRRIGATION IR SOLN
Status: DC | PRN
  Administered 2013-04-13: 08:00:00

## 2013-04-13 MED ORDER — GLYCOPYRROLATE 0.2 MG/ML IJ SOLN
0.2000 mg | Freq: Once | INTRAMUSCULAR | Status: AC
  Administered 2013-04-13: 0.2 mg via INTRAVENOUS

## 2013-04-13 MED ORDER — MIDAZOLAM HCL 5 MG/5ML IJ SOLN
INTRAMUSCULAR | Status: DC | PRN
  Administered 2013-04-13: 2 mg via INTRAVENOUS

## 2013-04-13 MED ORDER — FENTANYL CITRATE 0.05 MG/ML IJ SOLN: 25.0000 ug | INTRAMUSCULAR | Status: DC | PRN

## 2013-04-13 MED ORDER — LIDOCAINE HCL (PF) 1 % IJ SOLN
INTRAMUSCULAR | Status: AC
  Filled 2013-04-13: qty 5

## 2013-04-13 MED ORDER — PROPOFOL INFUSION 10 MG/ML OPTIME
INTRAVENOUS | Status: DC | PRN
  Administered 2013-04-13: 75 ug/kg/min via INTRAVENOUS

## 2013-04-13 MED ORDER — FENTANYL CITRATE 0.05 MG/ML IJ SOLN
25.0000 ug | INTRAMUSCULAR | Status: AC
  Administered 2013-04-13 (×2): 25 ug via INTRAVENOUS

## 2013-04-13 MED ORDER — LIDOCAINE HCL (CARDIAC) 10 MG/ML IV SOLN
INTRAVENOUS | Status: DC | PRN
  Administered 2013-04-13: 50 mg via INTRAVENOUS

## 2013-04-13 MED ORDER — MIDAZOLAM HCL 2 MG/2ML IJ SOLN
INTRAMUSCULAR | Status: AC
  Filled 2013-04-13: qty 2

## 2013-04-13 MED ORDER — ONDANSETRON HCL 4 MG/2ML IJ SOLN
4.0000 mg | Freq: Once | INTRAMUSCULAR | Status: AC
  Administered 2013-04-13: 4 mg via INTRAVENOUS

## 2013-04-13 MED ORDER — WATER FOR IRRIGATION, STERILE IR SOLN
Status: DC | PRN
  Administered 2013-04-13: 1000 mL

## 2013-04-13 SURGICAL SUPPLY — 22 items
ELECT REM PT RETURN 9FT ADLT (ELECTROSURGICAL)
ELECTRODE REM PT RTRN 9FT ADLT (ELECTROSURGICAL) IMPLANT
FCP BXJMBJMB 240X2.8X (CUTTING FORCEPS)
FLOOR PAD 36X40 (MISCELLANEOUS) ×2
FORCEPS BIOP RAD 4 LRG CAP 4 (CUTTING FORCEPS) ×2 IMPLANT
FORCEPS BIOP RJ4 240 W/NDL (CUTTING FORCEPS)
FORCEPS BXJMBJMB 240X2.8X (CUTTING FORCEPS) IMPLANT
FORMALIN 10 PREFIL 20ML (MISCELLANEOUS) IMPLANT
INJECTOR/SNARE I SNARE (MISCELLANEOUS) IMPLANT
LUBRICANT JELLY 4.5OZ STERILE (MISCELLANEOUS) ×2 IMPLANT
MANIFOLD NEPTUNE II (INSTRUMENTS) ×2 IMPLANT
NEEDLE SCLEROTHERAPY 25GX240 (NEEDLE) IMPLANT
PAD FLOOR 36X40 (MISCELLANEOUS) ×1 IMPLANT
PROBE APC STR FIRE (PROBE) IMPLANT
PROBE INJECTION GOLD (MISCELLANEOUS)
PROBE INJECTION GOLD 7FR (MISCELLANEOUS) IMPLANT
SNARE ROTATE MED OVAL 20MM (MISCELLANEOUS) IMPLANT
SYR 50ML LL SCALE MARK (SYRINGE) ×2 IMPLANT
TRAP SPECIMEN MUCOUS 40CC (MISCELLANEOUS) IMPLANT
TUBING ENDO SMARTCAP PENTAX (MISCELLANEOUS) ×2 IMPLANT
TUBING IRRIGATION ENDOGATOR (MISCELLANEOUS) ×2 IMPLANT
WATER STERILE IRR 1000ML POUR (IV SOLUTION) ×2 IMPLANT

## 2013-04-13 NOTE — Transfer of Care (Signed)
Immediate Anesthesia Transfer of Care Note  Patient: Brian [name]  Procedure(s) Performed: Procedure(s) with comments: COLONOSCOPY WITH PROPOFOL (N/A) - In cecum at 0759  Patient Location: PACU  Anesthesia Type:MAC  Level of Consciousness: sedated and patient cooperative  Airway & Oxygen Therapy: Patient Spontanous Breathing and Patient connected to nasal cannula oxygen  Post-op Assessment: Report given to PACU RN and Post -op Vital signs reviewed and stable  Post vital signs: Reviewed and stable  Complications: No apparent anesthesia complications
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
## 2013-04-13 NOTE — Op Note (Signed)
[facility] [address] [place], [address]   COLONOSCOPY PROCEDURE REPORT  PATIENT: Brian [name], Brian [name]  MR#:         [id number] BIRTHDATE: 10-Jan-1956 , 57  yrs. old GENDER: Male ENDOSCOPIST: R.  Roetta Sessions, MD FACP [facility] Graham REFERRED BY:  Brian [name] Patty Claggett, PA-C PROCEDURE DATE:  04/13/2013 PROCEDURE:     Colonoscopy with biopsy  INDICATIONS: History of colonic adenoma  INFORMED CONSENT:  The risks, benefits, alternatives and imponderables including but not limited to bleeding, perforation as well as the possibility of a missed lesion have been reviewed.  The potential for biopsy, lesion removal, etc. have also been discussed.  Questions have been answered.  All parties agreeable. Please see the history and physical in the medical record for more information.  MEDICATIONS: Deep sedation per Dr. [facility]  DESCRIPTION OF PROCEDURE:  After a digital rectal exam was performed, the     colonoscope was advanced from the anus through the rectum and colon to the area of the cecum, ileocecal valve and appendiceal orifice.  The cecum was deeply intubated.  These structures were well-seen and photographed for the record.  From the level of the cecum and ileocecal valve, the scope was slowly and cautiously withdrawn.  The mucosal surfaces were carefully surveyed utilizing scope tip deflection to facilitate fold flattening as needed.  The scope was pulled down into the rectum where a thorough examination including retroflexion was performed.     FINDINGS:  Adequate preparation. Internal hemorrhoids; otherwise, normal rectum. Scattered left-sided diverticula; (1) diminutive polyp in the mid sigmoid segment; otherwise, the remainder of the colonic mucosa appeared normal.  THERAPEUTIC / DIAGNOSTIC MANEUVERS PERFORMED:  The above-mentioned polyp was cold biopsied/removed.  COMPLICATIONS: None  CECAL WITHDRAWAL TIME:  9 minutes  IMPRESSION:  Colonic  diverticulosis. Single colonic polyp-removed as described above.  RECOMMENDATIONS: Followup on pathology.   _______________________________ eSigned:  R. Roetta Sessions, MD FACP [facility] 04/13/2013 8:25 AM   CC:

## 2013-04-13 NOTE — Preoperative (Signed)
Beta Blockers   Reason not to administer Beta Blockers:Not Applicable 

## 2013-04-13 NOTE — Anesthesia Postprocedure Evaluation (Signed)
  Anesthesia Post-op Note  Patient: Brian [name]  Procedure(s) Performed: Procedure(s) with comments: COLONOSCOPY WITH PROPOFOL (N/A) - In cecum at 0759  Patient Location: PACU  Anesthesia Type:MAC  Level of Consciousness: oriented, sedated and patient cooperative  Airway and Oxygen Therapy: Patient Spontanous Breathing and Patient connected to nasal cannula oxygen  Post-op Pain: none  Post-op Assessment: Post-op Vital signs reviewed, Patient's Cardiovascular Status Stable, Respiratory Function Stable, Patent Airway, No signs of Nausea or vomiting and Pain level controlled  Post-op Vital Signs: Reviewed and stable  Complications: No apparent anesthesia complications

## 2013-04-13 NOTE — Interval H&P Note (Signed)
History and Physical Interval Note:  04/13/2013 7:34 AM  Brian [name]  has presented today for surgery, with the diagnosis of ABDOMINAL PAIN AND HISTORY OF POLYPS  The various methods of treatment have been discussed with the patient and family. After consideration of risks, benefits and other options for treatment, the patient has consented to  Procedure(s) with comments: COLONOSCOPY WITH PROPOFOL (N/A) - 7:30 as a surgical intervention .  The patient's history has been reviewed, patient examined, no change in status, stable for surgery.  I have reviewed the patient's chart and labs.  Questions were answered to the patient's satisfaction.      Patient seen and examined. No change.The risks, benefits, limitations, alternatives and imponderables have been reviewed with the patient. Questions have been answered. All parties are agreeable.    [name]

## 2013-04-13 NOTE — Anesthesia Procedure Notes (Signed)
Procedure Name: MAC Date/Time: 04/13/2013 7:39 AM Performed by: Carolyne Littles, AMY L Pre-anesthesia Checklist: Patient identified, Timeout performed, Emergency Drugs available, Suction available and Patient being monitored Oxygen Delivery Method: Non-rebreather mask

## 2013-04-13 NOTE — Anesthesia Preprocedure Evaluation (Signed)
Anesthesia Evaluation  Patient identified by MRN, date of birth, ID band Patient awake    Reviewed: Allergy & Precautions, H&P , NPO status , Patient's Chart, lab work & pertinent test results  Airway Mallampati: I TM Distance: >3 FB Neck ROM: Full    Dental no notable dental hx.    Pulmonary    Pulmonary exam normal       Cardiovascular hypertension, Pt. on medications Rhythm:Regular Rate:Normal     Neuro/Psych  Headaches, Seizures -, Well Controlled,  negative psych ROS   GI/Hepatic GERD-  Medicated and Controlled,  Endo/Other  diabetes, Type 2, Oral Hypoglycemic Agents  Renal/GU      Musculoskeletal negative musculoskeletal ROS (+)   Abdominal Normal abdominal exam  (+)   Peds  Hematology negative hematology ROS (+)   Anesthesia Other Findings   Reproductive/Obstetrics                           Anesthesia Physical Anesthesia Plan  ASA: III  Anesthesia Plan: MAC   Post-op Pain Management:    Induction: Intravenous  Airway Management Planned: Simple Face Mask  Additional Equipment:   Intra-op Plan:   Post-operative Plan:   Informed Consent: I have reviewed the patients History and Physical, chart, labs and discussed the procedure including the risks, benefits and alternatives for the proposed anesthesia with the patient or authorized representative who has indicated his/her understanding and acceptance.     Plan Discussed with:   Anesthesia Plan Comments:         Anesthesia Quick Evaluation

## 2013-04-14 ENCOUNTER — Encounter (HOSPITAL_COMMUNITY): Payer: Self-pay | Admitting: Internal Medicine

## 2013-04-15 ENCOUNTER — Encounter: Payer: Self-pay | Admitting: Internal Medicine

## 2013-05-22 ENCOUNTER — Encounter (HOSPITAL_COMMUNITY): Payer: Self-pay | Admitting: *Deleted

## 2013-05-22 ENCOUNTER — Emergency Department (HOSPITAL_COMMUNITY): Payer: Medicare HMO

## 2013-05-22 ENCOUNTER — Emergency Department (HOSPITAL_COMMUNITY)
Admission: EM | Admit: 2013-05-22 | Discharge: 2013-05-22 | Disposition: A | Discharge status: Home / Self Care | Payer: Medicare HMO | Attending: Emergency Medicine | Admitting: Emergency Medicine

## 2013-05-22 DIAGNOSIS — K589 Irritable bowel syndrome without diarrhea: Secondary | ICD-10-CM | POA: Insufficient documentation

## 2013-05-22 DIAGNOSIS — Z862 Personal history of diseases of the blood and blood-forming organs and certain disorders involving the immune mechanism: Secondary | ICD-10-CM | POA: Insufficient documentation

## 2013-05-22 DIAGNOSIS — Z79899 Other long term (current) drug therapy: Secondary | ICD-10-CM | POA: Insufficient documentation

## 2013-05-22 DIAGNOSIS — F17200 Nicotine dependence, unspecified, uncomplicated: Secondary | ICD-10-CM | POA: Insufficient documentation

## 2013-05-22 DIAGNOSIS — F411 Generalized anxiety disorder: Secondary | ICD-10-CM | POA: Insufficient documentation

## 2013-05-22 DIAGNOSIS — Z8639 Personal history of other endocrine, nutritional and metabolic disease: Secondary | ICD-10-CM | POA: Insufficient documentation

## 2013-05-22 DIAGNOSIS — G8929 Other chronic pain: Secondary | ICD-10-CM | POA: Insufficient documentation

## 2013-05-22 DIAGNOSIS — G40909 Epilepsy, unspecified, not intractable, without status epilepticus: Secondary | ICD-10-CM | POA: Insufficient documentation

## 2013-05-22 DIAGNOSIS — Z860100 Personal history of colon polyps, unspecified: Secondary | ICD-10-CM | POA: Insufficient documentation

## 2013-05-22 DIAGNOSIS — R109 Unspecified abdominal pain: Secondary | ICD-10-CM

## 2013-05-22 DIAGNOSIS — K3184 Gastroparesis: Secondary | ICD-10-CM | POA: Insufficient documentation

## 2013-05-22 DIAGNOSIS — F329 Major depressive disorder, single episode, unspecified: Secondary | ICD-10-CM | POA: Insufficient documentation

## 2013-05-22 DIAGNOSIS — Z8619 Personal history of other infectious and parasitic diseases: Secondary | ICD-10-CM | POA: Insufficient documentation

## 2013-05-22 DIAGNOSIS — I10 Essential (primary) hypertension: Secondary | ICD-10-CM | POA: Insufficient documentation

## 2013-05-22 DIAGNOSIS — R300 Dysuria: Secondary | ICD-10-CM | POA: Insufficient documentation

## 2013-05-22 DIAGNOSIS — E119 Type 2 diabetes mellitus without complications: Secondary | ICD-10-CM | POA: Insufficient documentation

## 2013-05-22 DIAGNOSIS — K219 Gastro-esophageal reflux disease without esophagitis: Secondary | ICD-10-CM | POA: Insufficient documentation

## 2013-05-22 DIAGNOSIS — F3289 Other specified depressive episodes: Secondary | ICD-10-CM | POA: Insufficient documentation

## 2013-05-22 DIAGNOSIS — Z9889 Other specified postprocedural states: Secondary | ICD-10-CM | POA: Insufficient documentation

## 2013-05-22 LAB — COMPREHENSIVE METABOLIC PANEL
ALT: 23 U/L (ref 0–53)
AST: 21 U/L (ref 0–37)
Albumin: 4.1 g/dL (ref 3.5–5.2)
Alkaline Phosphatase: 67 U/L (ref 39–117)
BUN: 15 mg/dL (ref 6–23)
CO2: 22 mEq/L (ref 19–32)
Calcium: 9.3 mg/dL (ref 8.4–10.5)
Chloride: 102 mEq/L (ref 96–112)
Creatinine, Ser: 1.26 mg/dL (ref 0.50–1.35)
GFR calc Af Amer: 72 mL/min — ABNORMAL LOW (ref >90)
GFR calc non Af Amer: 62 mL/min — ABNORMAL LOW (ref >90)
Glucose, Bld: 105 mg/dL — ABNORMAL HIGH (ref 70–99)
Potassium: 3.8 mEq/L (ref 3.5–5.1)
Sodium: 136 mEq/L (ref 135–145)
Total Bilirubin: 0.3 mg/dL (ref 0.3–1.2)
Total Protein: 7.5 g/dL (ref 6.0–8.3)

## 2013-05-22 LAB — CBC WITH DIFFERENTIAL/PLATELET
Basophils Absolute: 0 10*3/uL (ref 0.0–0.1)
Basophils Relative: 1 % (ref 0–1)
Eosinophils Absolute: 0.4 10*3/uL (ref 0.0–0.7)
Eosinophils Relative: 5 % (ref 0–5)
HCT: 41.4 % (ref 39.0–52.0)
Hemoglobin: 14.3 g/dL (ref 13.0–17.0)
Lymphocytes Relative: 36 % (ref 12–46)
Lymphs Abs: 3 10*3/uL (ref 0.7–4.0)
MCH: 32.4 pg (ref 26.0–34.0)
MCHC: 34.5 g/dL (ref 30.0–36.0)
MCV: 93.7 fL (ref 78.0–100.0)
Monocytes Absolute: 0.7 10*3/uL (ref 0.1–1.0)
Monocytes Relative: 9 % (ref 3–12)
Neutro Abs: 4.2 10*3/uL (ref 1.7–7.7)
Neutrophils Relative %: 50 % (ref 43–77)
Platelets: 195 10*3/uL (ref 150–400)
RBC: 4.42 MIL/uL (ref 4.22–5.81)
RDW: 12.8 % (ref 11.5–15.5)
WBC: 8.3 10*3/uL (ref 4.0–10.5)

## 2013-05-22 LAB — TROPONIN I: Troponin I: <0.3 ng/mL (ref <0.30)

## 2013-05-22 LAB — URINALYSIS, ROUTINE W REFLEX MICROSCOPIC
Bilirubin Urine: NEGATIVE
Glucose, UA: NEGATIVE mg/dL
Hgb urine dipstick: NEGATIVE
Ketones, ur: NEGATIVE mg/dL
Leukocytes, UA: NEGATIVE
Nitrite: NEGATIVE
Protein, ur: NEGATIVE mg/dL
Specific Gravity, Urine: >1.03 — ABNORMAL HIGH (ref 1.005–1.030)
Urobilinogen, UA: 0.2 mg/dL (ref 0.0–1.0)
pH: 6 (ref 5.0–8.0)

## 2013-05-22 MED ORDER — MORPHINE SULFATE 4 MG/ML IJ SOLN
4.0000 mg | Freq: Once | INTRAMUSCULAR | Status: AC
  Administered 2013-05-22: 4 mg via INTRAVENOUS
  Filled 2013-05-22: qty 1

## 2013-05-22 MED ORDER — LORAZEPAM 1 MG PO TABS
1.0000 mg | ORAL_TABLET | Freq: Once | ORAL | Status: AC
  Administered 2013-05-22: 1 mg via ORAL
  Filled 2013-05-22: qty 1

## 2013-05-22 MED ORDER — IOHEXOL 300 MG/ML  SOLN
100.0000 mL | Freq: Once | INTRAMUSCULAR | Status: AC | PRN
  Administered 2013-05-22: 100 mL via INTRAVENOUS

## 2013-05-22 MED ORDER — ONDANSETRON HCL 4 MG/2ML IJ SOLN
4.0000 mg | Freq: Once | INTRAMUSCULAR | Status: AC
  Administered 2013-05-22: 4 mg via INTRAVENOUS
  Filled 2013-05-22: qty 2

## 2013-05-22 MED ORDER — IOHEXOL 300 MG/ML  SOLN
50.0000 mL | Freq: Once | INTRAMUSCULAR | Status: AC | PRN
  Administered 2013-05-22: 50 mL via ORAL

## 2013-05-22 MED ORDER — DICYCLOMINE HCL 20 MG PO TABS: 20.0000 mg | ORAL_TABLET | Freq: Three times a day (TID) | ORAL | Status: DC | PRN

## 2013-05-22 NOTE — ED Notes (Signed)
C/O sudden onset, explosive abdominal pain, stabbing in nature began around 1430.  Eased off at home, then began again in waiting area.  States initial pain was in Kenneth, but now in pelvis.  Experiencing difficulty initiating urine stream and states it has strange odor.  No n/v/d.  LNBM this a.m.

## 2013-05-22 NOTE — ED Notes (Signed)
MD at bedside. 

## 2013-05-22 NOTE — ED Notes (Addendum)
Pt now says he hurts in rt abd, moaning,  Trying to get a urine  " I think I have appendicitis"

## 2013-05-22 NOTE — ED Notes (Signed)
Chest and abd pain, onset 45 min ago  .  Similar episode in May. And treated here  And admitted. No sob at present.

## 2013-05-22 NOTE — ED Notes (Signed)
Patient states that the pain in his chest has came back. MD notified.

## 2013-05-22 NOTE — ED Notes (Signed)
Patient indicates that the pain is more epigastric than anything.
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
## 2013-05-23 ENCOUNTER — Telehealth: Payer: Self-pay | Admitting: Internal Medicine

## 2013-05-23 MED ORDER — DICYCLOMINE HCL 20 MG PO TABS: 20.0000 mg | ORAL_TABLET | Freq: Three times a day (TID) | ORAL | Status: DC | PRN

## 2013-05-23 NOTE — Telephone Encounter (Signed)
Short supply given from ER. New refill provided.

## 2013-05-23 NOTE — Telephone Encounter (Signed)
Pt has a Rx from the ER on 05/22/13.

## 2013-05-23 NOTE — Telephone Encounter (Signed)
Pt said that RMR was suppose to call in dicyclomine to [facility] pharmacy in [place] and they have not received it yet. Pharmacy number is [phone]

## 2013-06-22 ENCOUNTER — Ambulatory Visit (INDEPENDENT_AMBULATORY_CARE_PROVIDER_SITE_OTHER): Payer: Medicare HMO | Admitting: Gastroenterology

## 2013-06-22 ENCOUNTER — Encounter: Payer: Self-pay | Admitting: Gastroenterology

## 2013-06-22 ENCOUNTER — Other Ambulatory Visit: Payer: Self-pay | Admitting: Gastroenterology

## 2013-06-22 ENCOUNTER — Encounter (INDEPENDENT_AMBULATORY_CARE_PROVIDER_SITE_OTHER): Payer: Self-pay

## 2013-06-22 VITALS — BP 128/73 | HR 77 | Temp 97.6°F | Wt 221.0 lb

## 2013-06-22 DIAGNOSIS — R109 Unspecified abdominal pain: Secondary | ICD-10-CM

## 2013-06-22 DIAGNOSIS — R1310 Dysphagia, unspecified: Secondary | ICD-10-CM

## 2013-06-22 DIAGNOSIS — R1319 Other dysphagia: Secondary | ICD-10-CM

## 2013-06-22 DIAGNOSIS — G8929 Other chronic pain: Secondary | ICD-10-CM

## 2013-06-22 MED ORDER — HYOSCYAMINE SULFATE 0.125 MG SL SUBL: 0.1250 mg | SUBLINGUAL_TABLET | SUBLINGUAL | Status: DC | PRN

## 2013-06-22 NOTE — Assessment & Plan Note (Signed)
Extensive work-up to include EGD 2013, TCS this year, several CT scans on file, pain management referral, with continued complaints from patient. Pain locations are very specific, pinpoint locations, with high likelihood of dealing with chronic abdominal wall pain. IBS may be playing a role as well. Bentyl with some improvement. I have added Levsin to be used very sparingly. Proceed with BPE due to dysphagia, consider repeat EGD/ED if necessary. Finally, patient will most likely need to be referred to a tertiary facility for further evaluation.

## 2013-06-22 NOTE — Assessment & Plan Note (Signed)
Recurrent dysphagia, known history of Schatzki's ring s/p dilation May 2013. Will proceed with BPE to assess for underlying motility disorder. Proceed with EGD/ED IF necessary after review of BPE.

## 2013-06-22 NOTE — Progress Notes (Signed)
cc'd to pcp 

## 2013-06-22 NOTE — Progress Notes (Signed)
Referring Provider: Tommie Raymond, MD Primary Care Physician:  Tommie Raymond, MD Primary GI: Dr. Jena Gauss   Chief Complaint  Patient presents with  . Abdominal Pain    sharpe pain    HPI:   Brian [name] presents today in follow-up with history of fatty liver, GERD, chronic diarrhea, chronic left-sided abdominal pain. EGD on file from May 2013 with cystic lesion at arytenoid cartilage on the right, non-critical Schatzki's ring s/p dilation, gastric erosions with negative H.pylori. He was referred to ENT, with subsequent cystic lesion removal. Recent colonoscopy with hyperplastic polyp; he will be due for surveillance in 2019. Referred to pain management. [facility]. Adjusted Neurontin. Believes he has been dismissed.   Seen in ED Sept 2014. Had sharp supraumbilical pain, epigastric pain, and RUQ. Specific locations. Started in RUQ initially. Felt knife-like. Associated nausea during episode. No vomiting. Spell last night with right upper chest/shoulder "puffing" up with sharp pain, followed by dull pain. Felt like someone had taken a knife and was just moving it around for 15 minutes. Doesn't know what it is but can't stand it anymore. Ate spaghetti last night prior to episode. Denies melena. Sometimes pain with eating. Recurrent dysphagia with bread, drink, sometimes pills. Noticed improvement after dilation May 2013.   Has chronic issues with abdominal pain. LLQ feels like he is being stuck with needles. Nothing improves. Had diarrhea on Sunday and Monday. Pain unchanged with bowel movements. No fever or chills. Bentyl helps with cramps. Denies reflux exacerbations.   Past Medical History  Diagnosis Date  . DM (diabetes mellitus)   . HTN (hypertension)   . IBS (irritable bowel syndrome)   . GERD (gastroesophageal reflux disease)   . High cholesterol   . Chronic abdominal pain 08/11/11    Reported per patient, Dr. Ralene Cork in [place]  . Adenomatous colon polyp     due for  surveillance 2016 per epic notes, performed at outside facility   . Headache(784.0)     migraines  . Gastroparesis   . Chronic diarrhea   . Diverticulosis   . H. pylori infection 2003    pt lived in [place], treated with prevpac.  . H/O Clostridium difficile infection 12/2012  . Anxiety   . Depression   . Shortness of breath   . Sleep apnea     not using cpap  . Seizures     1 seizure with severe back pain. no further meds or seizures  . HOH (hard of hearing)     Past Surgical History  Procedure Laterality Date  . Tonsillectomy    . Arm surgery      tendon/left  . Esophagogastroduodenoscopy  09/02/10    [facility], Dr. 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 pylori  . Esophagogastroduodenoscopy  02/03/2006    Dr. Steele Berg White-> hiatal hernia, atrial erosions  . Cholecystectomy  11/20/2011    Procedure: LAPAROSCOPIC CHOLECYSTECTOMY;  Surgeon: Fabio Bering, MD;  Location: [facility];  Service: General;  Laterality: N/A;  . Liver biopsy  11/20/2011    benign  . Esophagogastroduodenoscopy  01/21/2012    [id number] lesion at arytenoid cartilage on the right-likely explains some of his oro- pharyngeal symptoms/Hiatal hernia/Schatzki's ring s/p dilation, gastric erosions without H.pylori  . Larynx surgery      cyst removed, ENT [place]  . Vena cava  filter placement    . Colonoscopy  2012    Dr. Julian Reil, [place].pt gives history of adenomatous polyps and says he is due for repeat colonoscopy in 3 years.   . Colonoscopy with propofol N/A 04/13/2013    [id number] diverticulosis. Single colonic polyp, hyperplastic. Surveillance 2019.     Current Outpatient Prescriptions  Medication Sig Dispense Refill  . ACETAMINOPHEN-BUTALBITAL (BUPAP) 50-650 MG TABS Take 1 tablet by mouth every  6 (six) hours as needed. For headaches      . amitriptyline (ELAVIL) 100 MG tablet Take 100 mg by mouth at bedtime.      Marland Kitchen amLODipine-valsartan (EXFORGE) 5-160 MG per tablet Take 1 tablet by mouth 2 (two) times daily.      Marland Kitchen BIOTIN PO Take 10,000 mg by mouth daily.      . cyclobenzaprine (FLEXERIL) 5 MG tablet Take 5 mg by mouth 3 (three) times daily as needed for muscle spasms.      Marland Kitchen dicyclomine (BENTYL) 20 MG tablet Take 1 tablet (20 mg total) by mouth 3 (three) times daily as needed.  90 tablet  3  . diphenhydrAMINE (BENADRYL) 25 MG tablet Take 25 mg by mouth 2 (two) times daily.      . DULoxetine (CYMBALTA) 60 MG capsule Take 60 mg by mouth daily.       Marland Kitchen gabapentin (NEURONTIN) 600 MG tablet Take 600 mg by mouth 2 (two) times daily.      . metFORMIN (GLUCOPHAGE) 500 MG tablet Take 1 tablet (500 mg total) by mouth every morning. Restart on 6/2      . metoCLOPramide (REGLAN) 10 MG tablet Take 10 mg by mouth 2 (two) times daily.      Marland Kitchen MILK THISTLE PO Take 1,000 mg by mouth every evening.       . Multiple Vitamin (MULTIVITAMIN) capsule Take 1 capsule by mouth daily.      . Niacin-Simvastatin [facility]) 500-40 MG TB24 Take 1 tablet by mouth every evening.       . ondansetron (ZOFRAN) 4 MG tablet Take 1 tablet (4 mg total) by mouth 2 (two) times daily.  60 tablet  3  . pantoprazole (PROTONIX) 40 MG tablet Take 1 tablet (40 mg total) by mouth 2 (two) times daily.  60 tablet  5  . pioglitazone (ACTOS) 15 MG tablet Take 15 mg by mouth daily.      . ranitidine (ZANTAC) 150 MG tablet Take 150 mg by mouth at bedtime.       . sucralfate (CARAFATE) 1 G tablet Take 1 g by mouth 4 (four) times daily.      Marland Kitchen topiramate (TOPAMAX) 100 MG tablet Take 100 mg by mouth 2 (two) times daily.      Marland Kitchen VIAGRA 100 MG tablet Take 100 mg by mouth daily as needed. For intercourse      . vitamin B-12 (CYANOCOBALAMIN) 1000 MCG tablet Take 1,000 mcg by mouth 2 (two) times daily.      . vitamin E 400 UNIT capsule Take 400  Units by mouth 2 (two) times daily.       No current facility-administered medications for this visit.   Facility-Administered Medications Ordered in Other Visits  Medication Dose Route Frequency Provider Last Rate Last Dose  . sodium chloride irrigation 0.9 %    PRN Fabio Bering, MD   1,000 mL at 11/20/11 0815    Allergies as of 06/22/2013 - Review Complete 06/22/2013  Allergen Reaction Noted  . Ketorolac  tromethamine  10/27/2011  . Sulfamethoxazole Rash 10/27/2011  . Tetracyclines & related Rash 10/27/2011    Family History  Problem Relation Age of Onset  . Cancer Father 66    Gallbladder  . Anesthesia problems Neg Hx   . Hypotension Neg Hx   . Malignant hyperthermia Neg Hx   . Pseudochol deficiency Neg Hx   . Diabetes Mother   . GER disease Mother     History   Social History  . Marital Status: Single    Spouse Name: N/A    Number of Children: 1  . Years of Education: N/A   Occupational History  . disabled    Social History Main Topics  . Smoking status: Current Every Day Smoker -- 1.00 packs/day for 42 years    Types: Cigarettes  . Smokeless tobacco: None  . Alcohol Use: Yes     Comment: occasionally  . Drug Use: No  . Sexual Activity: None   Other Topics Concern  . None   Social History Narrative   Divorced, moved to [place] November 2012    Review of Systems: As mentioned in HPI.   Physical Exam: BP 128/73  Pulse 77  Temp(Src) 97.6 F (36.4 C) (Oral)  Wt 221 lb (100.245 kg)  BMI 29.97 kg/m2 General:   Alert and oriented. No distress noted. Flat affect.  Head:  Normocephalic and atraumatic. Eyes:  Conjuctiva clear without scleral icterus. Heart:  S1, S2 present without murmurs, rubs, or gallops. Regular rate and rhythm. Abdomen:  +BS, soft, winces with only light touch, appears to anticipate palpation and wincing prior to exam is even begun. No acute findings.  Msk:  Symmetrical without gross deformities. Normal posture. Right pectoral  muscle without any abnormalities (examined due to patient's concerns).  Extremities:  Without edema. Neurologic:  Alert and  oriented x4;  grossly normal neurologically. Skin:  Intact without significant lesions or rashes. Psych:  Alert and cooperative. Normal mood and affect.  Lab Results  Component Value Date   WBC 8.3 05/22/2013   HGB 14.3 05/22/2013   HCT 41.4 05/22/2013   MCV 93.7 05/22/2013   PLT 195 05/22/2013   Lab Results  Component Value Date   ALT 23 05/22/2013   AST 21 05/22/2013   ALKPHOS 67 05/22/2013   BILITOT 0.3 05/22/2013   Lab Results  Component Value Date   LIPASE 31 01/27/2013   CT Sept 2014: no acute findings.

## 2013-06-22 NOTE — Patient Instructions (Signed)
I have sent in a prescription for Levsin to use sparingly. This dissolves under your tongue. Only use for abdominal cramping. We may have to talk with your insurance company about covering this.   I have set you up for an xray swallow test. After this is reviewed, we will see if you need another upper endoscopy.

## 2013-06-23 ENCOUNTER — Ambulatory Visit (HOSPITAL_COMMUNITY)
Admission: RE | Admit: 2013-06-23 | Discharge: 2013-06-23 | Disposition: A | Discharge status: Home / Self Care | Payer: Medicare HMO | Source: Ambulatory Visit | Attending: Gastroenterology | Admitting: Gastroenterology

## 2013-06-23 DIAGNOSIS — R1310 Dysphagia, unspecified: Secondary | ICD-10-CM | POA: Insufficient documentation

## 2013-06-23 DIAGNOSIS — K224 Dyskinesia of esophagus: Secondary | ICD-10-CM | POA: Insufficient documentation

## 2013-06-23 DIAGNOSIS — R1319 Other dysphagia: Secondary | ICD-10-CM

## 2013-06-23 DIAGNOSIS — R933 Abnormal findings on diagnostic imaging of other parts of digestive tract: Secondary | ICD-10-CM | POA: Insufficient documentation

## 2013-06-27 NOTE — Progress Notes (Signed)
Received clinic notes from [facility] in [place], [place], Dr. Craig Guess.   Seen in consultation Apr 03, 2013 and then follow-up Sept 30, 2014.   Original consultation: Cymbalta increased to 120 mg for 2 weeks due to possibility of neuropathic pain. When he returned in September, he was taking Neurontin 600 mg TID and Cymbalta 60 mg daily. Requested hydrocodone at second visit. Did not provide medical records to Dr. Craig Guess as requested Dr. Craig Guess noted patient made many requests for hydrocodone, which are not appropriate at this time.   Next scheduled appt is Jul 20, 2013.

## 2013-06-28 NOTE — Progress Notes (Signed)
Quick Note:  Patient is on Protonix BID.  BPE reveals esophageal motility and esophageal thickening No evidence of obstruction Last EGD in May 2013  Dysphagia likely multifactorial due to likely uncontrolled GERD and motility disorder.  Sit upright during meals. GERD precautions: please provide handout. Would offer repeat EGD/ED, as he did notice improvement after dilation May 2013. Needs to be done with Propofol due to polypharmacy. EGD/ED with RMR ______

## 2013-08-03 ENCOUNTER — Other Ambulatory Visit: Payer: Self-pay | Admitting: Gastroenterology

## 2013-11-02 ENCOUNTER — Other Ambulatory Visit: Payer: Self-pay | Admitting: Gastroenterology

## 2013-11-02 ENCOUNTER — Telehealth: Payer: Self-pay | Admitting: Gastroenterology

## 2013-11-02 NOTE — Telephone Encounter (Signed)
Filled refill request.  Needs OV.

## 2013-11-07 ENCOUNTER — Encounter: Payer: Self-pay | Admitting: Internal Medicine

## 2013-11-07 NOTE — Telephone Encounter (Signed)
Mailed letter for patient to contact us to make OV to further refills

## 2014-01-05 ENCOUNTER — Ambulatory Visit: Payer: Medicare HMO | Admitting: Gastroenterology

## 2014-01-08 ENCOUNTER — Encounter (INDEPENDENT_AMBULATORY_CARE_PROVIDER_SITE_OTHER): Payer: Self-pay

## 2014-01-08 ENCOUNTER — Other Ambulatory Visit: Payer: Self-pay | Admitting: Internal Medicine

## 2014-01-08 ENCOUNTER — Encounter: Payer: Self-pay | Admitting: Gastroenterology

## 2014-01-08 ENCOUNTER — Ambulatory Visit (INDEPENDENT_AMBULATORY_CARE_PROVIDER_SITE_OTHER): Payer: Medicare HMO | Admitting: Gastroenterology

## 2014-01-08 VITALS — BP 113/68 | HR 83 | Temp 97.4°F | Ht 74.0 in | Wt 228.2 lb

## 2014-01-08 DIAGNOSIS — R109 Unspecified abdominal pain: Secondary | ICD-10-CM

## 2014-01-08 DIAGNOSIS — K7689 Other specified diseases of liver: Secondary | ICD-10-CM

## 2014-01-08 DIAGNOSIS — K3184 Gastroparesis: Secondary | ICD-10-CM

## 2014-01-08 DIAGNOSIS — R1310 Dysphagia, unspecified: Secondary | ICD-10-CM

## 2014-01-08 DIAGNOSIS — K760 Fatty (change of) liver, not elsewhere classified: Secondary | ICD-10-CM

## 2014-01-08 DIAGNOSIS — R197 Diarrhea, unspecified: Secondary | ICD-10-CM

## 2014-01-08 DIAGNOSIS — G8929 Other chronic pain: Secondary | ICD-10-CM

## 2014-01-08 DIAGNOSIS — K219 Gastro-esophageal reflux disease without esophagitis: Secondary | ICD-10-CM

## 2014-01-08 MED ORDER — DICYCLOMINE HCL 20 MG PO TABS: 20.0000 mg | ORAL_TABLET | Freq: Two times a day (BID) | ORAL | Status: DC
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
## 2014-01-08 NOTE — Patient Instructions (Signed)
1. Upper endoscopy as scheduled.  Please see separate instructions. 

## 2014-01-09 ENCOUNTER — Encounter (HOSPITAL_COMMUNITY): Payer: Self-pay | Admitting: Pharmacy Technician

## 2014-01-09 ENCOUNTER — Encounter: Payer: Self-pay | Admitting: Gastroenterology

## 2014-01-10 NOTE — Assessment & Plan Note (Signed)
Liver biopsy at time of cholecystectomy 2013 showed minimal steatosis. LFTs are normal.  Instructions for fatty liver: Recommend 1-2# weight loss per week until ideal body weight through exercise & diet. Low fat/cholesterol diet.   Avoid sweets, sodas, fruit juices, sweetened beverages like tea, etc. Gradually increase exercise from 15 min daily up to 1 hr per day 5 days/week. Limit alcohol use.

## 2014-01-10 NOTE — Assessment & Plan Note (Signed)
Chronic abdominal pain extensively worked up in the past. Questionable multifactorial including neuropathic, IBS. Notable increased abdominal cramping with decreased dicyclomine dosage. Increase dicyclomine to 20 mg twice daily. Advised of potential side effects. Await more information from Dr. Freddie Apley office to see if approved for chronic pain management.

## 2014-01-10 NOTE — Assessment & Plan Note (Signed)
Recurrent dysphagia. Describes near food impactions. Last EGD 2013 with noncritical Schatzki ring requiring dilation at that time. Suspect multifactorial including some esophageal motility disorder based on esophagram last year. Patient recalls having significant improvement of swallowing post dilation therefore offered repeat endoscopy. Also with complaints of early satiety in the setting of known abnormal gastric imaging study last year. Given chronic pain management/medications plan for EGD with deep sedation.  I have discussed the risks, alternatives, benefits with regards to but not limited to the risk of reaction to medication, bleeding, infection, perforation and the patient is agreeable to proceed. Written consent to be obtained.

## 2014-01-10 NOTE — Assessment & Plan Note (Signed)
Improved on Bentyl.

## 2014-01-15 NOTE — Progress Notes (Signed)
cc'd to pcp 

## 2014-01-17 NOTE — Patient Instructions (Addendum)
Your procedure is scheduled on:  01/25/14  Report to [facility] at 09:00 AM  Call this number if you have problems the morning of surgery: [phone]   Remember:   Do not eat food or drink liquids after midnight.   Take these medicines the morning of surgery with A SIP OF WATER: Exforge, Cymbalta, Gabapentin, Protonix and Topiramate. You may take your Cyclobenazepril and Zofran if needed.   Do not wear jewelry, make-up or nail polish.  Do not wear lotions, powders, or perfumes.   Do not shave 48 hours prior to surgery. Men may shave face and neck.  Do not bring valuables to the hospital.  [facility] is not responsible for any belongings or valuables.               Contacts, dentures or bridgework may not be worn into surgery.  Leave suitcase in the car. After surgery it may be brought to your room.  For patients admitted to the hospital, discharge time is determined by your treatment team.               Patients discharged the day of surgery will not be allowed to drive home.    Special Instructions: N/A   Please read over the following fact sheets that you were given: Anesthesia Post-op Instructions and Care and Recovery After Surgery     Esophagogastroduodenoscopy Esophagogastroduodenoscopy (EGD) is a procedure to examine the lining of the esophagus, stomach, and first part of the small intestine (duodenum). A Mcsorley, flexible, lighted tube with a camera attached (endoscope) is inserted down the throat to view these organs. This procedure is done to detect problems or abnormalities, such as inflammation, bleeding, ulcers, or growths, in order to treat them. The procedure lasts about 5 20 minutes. It is usually an outpatient procedure, but it may need to be performed in emergency cases in the hospital. LET YOUR CAREGIVER KNOW ABOUT:   Allergies to food or medicine.  All medicines you are taking, including vitamins, herbs, eyedrops, and over-the-counter medicines and creams.  Use  of steroids (by mouth or creams).  Previous problems you or members of your family have had with the use of anesthetics.  Any blood disorders you have.  Previous surgeries you have had.  Other health problems you have.  Possibility of pregnancy, if this applies. RISKS AND COMPLICATIONS  Generally, EGD is a safe procedure. However, as with any procedure, complications can occur. Possible complications include:  Infection.  Bleeding.  Tearing (perforation) of the esophagus, stomach, or duodenum.  Difficulty breathing or not being able to breath.  Excessive sweating.  Spasms of the larynx.  Slowed heartbeat.  Low blood pressure. BEFORE THE PROCEDURE  Do not eat or drink anything for 6 8 hours before the procedure or as directed by your caregiver.  Ask your caregiver about changing or stopping your regular medicines.  If you wear dentures, be prepared to remove them before the procedure.  Arrange for someone to drive you home after the procedure. PROCEDURE   A vein will be accessed to give medicines and fluids. A medicine to relax you (sedative) and a pain reliever will be given through that access into the vein.  A numbing medicine (local anesthetic) may be sprayed on your throat for comfort and to stop you from gagging or coughing.  A mouth guard may be placed in your mouth to protect your teeth and to keep you from biting on the endoscope.  You will be asked  to lie on your left side.  The endoscope is inserted down your throat and into the esophagus, stomach, and duodenum.  Air is put through the endoscope to allow your caregiver to view the lining of your esophagus clearly.  The esophagus, stomach, and duodenum is then examined. During the exam, your caregiver may:  Remove tissue to be examined under a microscope (biopsy) for inflammation, infection, or other medical problems.  Remove growths.  Remove objects (foreign bodies) that are stuck.  Treat any  bleeding with medicines or other devices that stop tissues from bleeding (hot cauters, clipping devices).  Widen (dilate) or stretch narrowed areas of the esophagus and stomach.  The endoscope will then be withdrawn. AFTER THE PROCEDURE  You will be taken to a recovery area to be monitored. You will be able to go home once you are stable and alert.  Do not eat or drink anything until the local anesthetic and numbing medicines have worn off. You may choke.  It is normal to feel bloated, have pain with swallowing, or have a sore throat for a short time. This will wear off.  Your caregiver should be able to discuss his or her findings with you. It will take longer to discuss the test results if any biopsies were taken. Document Released: 12/18/2004 Document Revised: 08/03/2012 Document Reviewed: 07/20/2012 [facility] Patient Information 2014 [place], [place].    PATIENT INSTRUCTIONS POST-ANESTHESIA  IMMEDIATELY FOLLOWING SURGERY:  Do not drive or operate machinery for the first twenty four hours after surgery.  Do not make any important decisions for twenty four hours after surgery or while taking narcotic pain medications or sedatives.  If you develop intractable nausea and vomiting or a severe headache please notify your doctor immediately.  FOLLOW-UP:  Please make an appointment with your surgeon as instructed. You do not need to follow up with anesthesia unless specifically instructed to do so.  WOUND CARE INSTRUCTIONS (if applicable):  Keep a dry clean dressing on the anesthesia/puncture wound site if there is drainage.  Once the wound has quit draining you may leave it open to air.  Generally you should leave the bandage intact for twenty four hours unless there is drainage.  If the epidural site drains for more than 36-48 hours please call the anesthesia department.  QUESTIONS?:  Please feel free to call your physician or the hospital operator if you have any questions, and they will be  happy to assist you.

## 2014-01-18 ENCOUNTER — Encounter (HOSPITAL_COMMUNITY)
Admission: RE | Admit: 2014-01-18 | Discharge: 2014-01-18 | Disposition: A | Discharge status: Home / Self Care | Payer: Medicare HMO | Source: Ambulatory Visit | Attending: Internal Medicine | Admitting: Internal Medicine

## 2014-01-18 ENCOUNTER — Encounter (HOSPITAL_COMMUNITY): Payer: Self-pay

## 2014-01-18 DIAGNOSIS — Z01812 Encounter for preprocedural laboratory examination: Secondary | ICD-10-CM | POA: Insufficient documentation

## 2014-01-18 LAB — BASIC METABOLIC PANEL
BUN: 19 mg/dL (ref 6–23)
CO2: 25 mEq/L (ref 19–32)
Calcium: 9.6 mg/dL (ref 8.4–10.5)
Chloride: 104 mEq/L (ref 96–112)
Creatinine, Ser: 1.3 mg/dL (ref 0.50–1.35)
GFR calc Af Amer: 69 mL/min — ABNORMAL LOW (ref >90)
GFR calc non Af Amer: 59 mL/min — ABNORMAL LOW (ref >90)
Glucose, Bld: 144 mg/dL — ABNORMAL HIGH (ref 70–99)
Potassium: 4.3 mEq/L (ref 3.7–5.3)
Sodium: 143 mEq/L (ref 137–147)

## 2014-01-18 LAB — HEMOGLOBIN AND HEMATOCRIT, BLOOD
HCT: 39 % (ref 39.0–52.0)
Hemoglobin: 12.9 g/dL — ABNORMAL LOW (ref 13.0–17.0)

## 2014-01-23 NOTE — Progress Notes (Signed)
Quick Note:  egd as planned. ______

## 2014-01-25 ENCOUNTER — Ambulatory Visit (HOSPITAL_COMMUNITY): Payer: Medicare HMO | Admitting: Anesthesiology

## 2014-01-25 ENCOUNTER — Encounter (HOSPITAL_COMMUNITY): Payer: Medicare HMO | Admitting: Anesthesiology

## 2014-01-25 ENCOUNTER — Encounter (HOSPITAL_COMMUNITY): Payer: Self-pay | Admitting: *Deleted

## 2014-01-25 ENCOUNTER — Ambulatory Visit (HOSPITAL_COMMUNITY)
Admission: RE | Admit: 2014-01-25 | Discharge: 2014-01-25 | Disposition: A | Discharge status: Home / Self Care | Payer: Medicare HMO | Source: Ambulatory Visit | Attending: Internal Medicine | Admitting: Internal Medicine

## 2014-01-25 ENCOUNTER — Encounter (HOSPITAL_COMMUNITY)
Admission: RE | Disposition: A | Discharge status: Home / Self Care | Payer: Self-pay | Source: Ambulatory Visit | Attending: Internal Medicine

## 2014-01-25 DIAGNOSIS — Z882 Allergy status to sulfonamides status: Secondary | ICD-10-CM | POA: Insufficient documentation

## 2014-01-25 DIAGNOSIS — Z860100 Personal history of colon polyps, unspecified: Secondary | ICD-10-CM | POA: Insufficient documentation

## 2014-01-25 DIAGNOSIS — K3189 Other diseases of stomach and duodenum: Secondary | ICD-10-CM

## 2014-01-25 DIAGNOSIS — I10 Essential (primary) hypertension: Secondary | ICD-10-CM | POA: Insufficient documentation

## 2014-01-25 DIAGNOSIS — Z881 Allergy status to other antibiotic agents status: Secondary | ICD-10-CM | POA: Insufficient documentation

## 2014-01-25 DIAGNOSIS — K3184 Gastroparesis: Secondary | ICD-10-CM | POA: Insufficient documentation

## 2014-01-25 DIAGNOSIS — F329 Major depressive disorder, single episode, unspecified: Secondary | ICD-10-CM | POA: Insufficient documentation

## 2014-01-25 DIAGNOSIS — K449 Diaphragmatic hernia without obstruction or gangrene: Secondary | ICD-10-CM | POA: Insufficient documentation

## 2014-01-25 DIAGNOSIS — K219 Gastro-esophageal reflux disease without esophagitis: Secondary | ICD-10-CM | POA: Insufficient documentation

## 2014-01-25 DIAGNOSIS — K224 Dyskinesia of esophagus: Secondary | ICD-10-CM | POA: Insufficient documentation

## 2014-01-25 DIAGNOSIS — F411 Generalized anxiety disorder: Secondary | ICD-10-CM | POA: Insufficient documentation

## 2014-01-25 DIAGNOSIS — F3289 Other specified depressive episodes: Secondary | ICD-10-CM | POA: Insufficient documentation

## 2014-01-25 DIAGNOSIS — Z79899 Other long term (current) drug therapy: Secondary | ICD-10-CM | POA: Insufficient documentation

## 2014-01-25 DIAGNOSIS — K589 Irritable bowel syndrome without diarrhea: Secondary | ICD-10-CM | POA: Insufficient documentation

## 2014-01-25 DIAGNOSIS — Z7982 Long term (current) use of aspirin: Secondary | ICD-10-CM | POA: Insufficient documentation

## 2014-01-25 DIAGNOSIS — G4730 Sleep apnea, unspecified: Secondary | ICD-10-CM | POA: Insufficient documentation

## 2014-01-25 DIAGNOSIS — K319 Disease of stomach and duodenum, unspecified: Secondary | ICD-10-CM

## 2014-01-25 DIAGNOSIS — K2970 Gastritis, unspecified, without bleeding: Secondary | ICD-10-CM | POA: Insufficient documentation

## 2014-01-25 DIAGNOSIS — K2990 Gastroduodenitis, unspecified, without bleeding: Secondary | ICD-10-CM

## 2014-01-25 DIAGNOSIS — E119 Type 2 diabetes mellitus without complications: Secondary | ICD-10-CM | POA: Insufficient documentation

## 2014-01-25 DIAGNOSIS — K229 Disease of esophagus, unspecified: Secondary | ICD-10-CM

## 2014-01-25 DIAGNOSIS — F17200 Nicotine dependence, unspecified, uncomplicated: Secondary | ICD-10-CM | POA: Insufficient documentation

## 2014-01-25 HISTORY — PX: ESOPHAGOGASTRODUODENOSCOPY (EGD) WITH PROPOFOL: SHX5813

## 2014-01-25 HISTORY — PX: ESOPHAGEAL DILATION: SHX303

## 2014-01-25 LAB — GLUCOSE, CAPILLARY
Glucose-Capillary: 101 mg/dL — ABNORMAL HIGH (ref 70–99)
Glucose-Capillary: 94 mg/dL (ref 70–99)

## 2014-01-25 SURGERY — ESOPHAGOGASTRODUODENOSCOPY (EGD) WITH PROPOFOL
Anesthesia: Monitor Anesthesia Care | Site: Esophagus

## 2014-01-25 MED ORDER — FENTANYL CITRATE 0.05 MG/ML IJ SOLN
25.0000 ug | INTRAMUSCULAR | Status: AC
  Administered 2014-01-25 (×2): 25 ug via INTRAVENOUS

## 2014-01-25 MED ORDER — MIDAZOLAM HCL 5 MG/5ML IJ SOLN
INTRAMUSCULAR | Status: DC | PRN
  Administered 2014-01-25 (×2): 1 mg via INTRAVENOUS

## 2014-01-25 MED ORDER — ONDANSETRON HCL 4 MG/2ML IJ SOLN
INTRAMUSCULAR | Status: AC
  Filled 2014-01-25: qty 2

## 2014-01-25 MED ORDER — FENTANYL CITRATE 0.05 MG/ML IJ SOLN: 25.0000 ug | INTRAMUSCULAR | Status: DC | PRN

## 2014-01-25 MED ORDER — LIDOCAINE HCL (PF) 1 % IJ SOLN
INTRAMUSCULAR | Status: AC
  Filled 2014-01-25: qty 5

## 2014-01-25 MED ORDER — PROPOFOL INFUSION 10 MG/ML OPTIME
INTRAVENOUS | Status: DC | PRN
  Administered 2014-01-25: 100 ug/kg/min via INTRAVENOUS

## 2014-01-25 MED ORDER — FENTANYL CITRATE 0.05 MG/ML IJ SOLN
INTRAMUSCULAR | Status: AC
  Filled 2014-01-25: qty 2

## 2014-01-25 MED ORDER — FENTANYL CITRATE 0.05 MG/ML IJ SOLN
INTRAMUSCULAR | Status: DC | PRN
  Administered 2014-01-25 (×2): 50 ug via INTRAVENOUS

## 2014-01-25 MED ORDER — MIDAZOLAM HCL 2 MG/2ML IJ SOLN
1.0000 mg | INTRAMUSCULAR | Status: DC | PRN
  Administered 2014-01-25 (×2): 2 mg via INTRAVENOUS
  Filled 2014-01-25: qty 2

## 2014-01-25 MED ORDER — LIDOCAINE VISCOUS 2 % MT SOLN
OROMUCOSAL | Status: AC
  Administered 2014-01-25: 3 mL
  Filled 2014-01-25: qty 15

## 2014-01-25 MED ORDER — MIDAZOLAM HCL 2 MG/2ML IJ SOLN
INTRAMUSCULAR | Status: AC
  Filled 2014-01-25: qty 2

## 2014-01-25 MED ORDER — ONDANSETRON HCL 4 MG/2ML IJ SOLN: 4.0000 mg | Freq: Once | INTRAMUSCULAR | Status: DC | PRN

## 2014-01-25 MED ORDER — LACTATED RINGERS IV SOLN
INTRAVENOUS | Status: DC | PRN
  Administered 2014-01-25: 09:00:00 via INTRAVENOUS

## 2014-01-25 MED ORDER — ONDANSETRON HCL 4 MG/2ML IJ SOLN
4.0000 mg | Freq: Once | INTRAMUSCULAR | Status: AC
  Administered 2014-01-25: 4 mg via INTRAVENOUS

## 2014-01-25 MED ORDER — GLYCOPYRROLATE 0.2 MG/ML IJ SOLN
0.2000 mg | Freq: Once | INTRAMUSCULAR | Status: AC
  Administered 2014-01-25: 0.2 mg via INTRAVENOUS

## 2014-01-25 MED ORDER — STERILE WATER FOR IRRIGATION IR SOLN
Status: DC | PRN
  Administered 2014-01-25: 1000 mL

## 2014-01-25 MED ORDER — LIDOCAINE HCL (CARDIAC) 10 MG/ML IV SOLN
INTRAVENOUS | Status: DC | PRN
  Administered 2014-01-25: 50 mg via INTRAVENOUS

## 2014-01-25 MED ORDER — LACTATED RINGERS IV SOLN
INTRAVENOUS | Status: DC
  Administered 2014-01-25: 10:00:00 via INTRAVENOUS

## 2014-01-25 MED ORDER — GLYCOPYRROLATE 0.2 MG/ML IJ SOLN
INTRAMUSCULAR | Status: AC
  Filled 2014-01-25: qty 1

## 2014-01-25 SURGICAL SUPPLY — 21 items
BLOCK BITE 60FR ADLT L/F BLUE (MISCELLANEOUS) ×2 IMPLANT
DEVICE CLIP HEMOSTAT 235CM (CLIP) IMPLANT
ELECT REM PT RETURN 9FT ADLT (ELECTROSURGICAL)
ELECTRODE REM PT RTRN 9FT ADLT (ELECTROSURGICAL) IMPLANT
FLOOR PAD 36X40 (MISCELLANEOUS) ×2
FORCEPS BIOP RAD 4 LRG CAP 4 (CUTTING FORCEPS) ×2 IMPLANT
FORMALIN 10 PREFIL 20ML (MISCELLANEOUS) IMPLANT
KIT CLEAN ENDO COMPLIANCE (KITS) ×2 IMPLANT
MANIFOLD NEPTUNE II (INSTRUMENTS) ×2 IMPLANT
NEEDLE SCLEROTHERAPY 25GX240 (NEEDLE) IMPLANT
PAD FLOOR 36X40 (MISCELLANEOUS) ×1 IMPLANT
PROBE APC STR FIRE (PROBE) IMPLANT
PROBE INJECTION GOLD (MISCELLANEOUS)
PROBE INJECTION GOLD 7FR (MISCELLANEOUS) IMPLANT
SNARE ROTATE MED OVAL 20MM (MISCELLANEOUS) IMPLANT
SNARE SHORT THROW 13M SML OVAL (MISCELLANEOUS) ×2 IMPLANT
SYR 50ML LL SCALE MARK (SYRINGE) ×2 IMPLANT
SYR INFLATION 60ML (SYRINGE) ×2 IMPLANT
TUBING ENDO SMARTCAP PENTAX (MISCELLANEOUS) ×2 IMPLANT
TUBING IRRIGATION ENDOGATOR (MISCELLANEOUS) IMPLANT
WATER STERILE IRR 1000ML POUR (IV SOLUTION) ×2 IMPLANT

## 2014-01-25 NOTE — Op Note (Signed)
[facility] [address] [place], [address]   ENDOSCOPY PROCEDURE REPORT  PATIENT: Brian [name], Brian [name]  MR#: [id number] BIRTHDATE: 1955/11/27 , 80  yrs. old GENDER: Male ENDOSCOPIST:  R.  Garfield Cornea, MD FACP [facility] REFERRED BY:     Dorna Mai, PA PROCEDURE DATE:  01/25/2014 PROCEDURE:     EGD with Venia Minks dilation followed by esophageal and gastric biopsy  INDICATIONS:    Recurrent esophageal dysphagia; epigastric pain  INFORMED CONSENT:   The risks, benefits, limitations, alternatives and imponderables have been discussed.  The potential for biopsy, esophogeal dilation, etc. have also been reviewed.  Questions have been answered.  All parties agreeable.  Please see the history and physical in the medical record for more information.  MEDICATIONS:    Deep sedation per Dr. [facility]  DESCRIPTION OF PROCEDURE:   The     endoscope was introduced through the mouth and advanced to the second portion of the duodenum without difficulty or limitations.  The mucosal surfaces were surveyed very carefully during advancement of the scope and upon withdrawal.  Retroflexion view of the proximal stomach and esophagogastric junction was performed.      FINDINGS: Esophagus patent throughout its course. Couple of tiny whitish nodules at the GE junction. No Barrett's esophagus. No esophagitis..  Much bile-stained mucus overlying the gastric mucosa. Diffuse gastric erythema. Couple of antral erosions. Small hiatal hernia. Patent pylorus. Normal first and second portion of the duodenum  THERAPEUTIC / DIAGNOSTIC MANEUVERS PERFORMED:  A 56 French Maloney dilator was passed to full insertion easily. A look back revealed no apparent complication related to this maneuver. Subsequently, biopsies the mid and distal esophagus were taken for histologic study. Finally, biopsies the abnormal gastric mucosa taken for histologic study.   COMPLICATIONS:   None  IMPRESSION:   Abnormal distal esophagus of uncertain significance. Status post passage of a Maloney dilator. Hiatal hernia. Abnormal gastric mucosa. Status post biopsy of the the esophagus and stomach.  RECOMMENDATIONS:  No change in medical regimen for time being. Followup on pathology. Further recommendations to follow.    _______________________________ R. Garfield Cornea, MD FACP [facility] eSigned:  R. Garfield Cornea, MD FACP [facility] 01/25/2014 11:46 AM     CC:

## 2014-01-25 NOTE — Discharge Instructions (Addendum)
EGD Discharge instructions Please read the instructions outlined below and refer to this sheet in the next few weeks. These discharge instructions provide you with general information on caring for yourself after you leave the hospital. Your doctor may also give you specific instructions. While your treatment has been planned according to the most current medical practices available, unavoidable complications occasionally occur. If you have any problems or questions after discharge, please call your doctor. ACTIVITY  You may resume your regular activity but move at a slower pace for the next 24 hours.   Take frequent rest periods for the next 24 hours.   Walking will help expel (get rid of) the air and reduce the bloated feeling in your abdomen.   No driving for 24 hours (because of the anesthesia (medicine) used during the test).   You may shower.   Do not sign any important legal documents or operate any machinery for 24 hours (because of the anesthesia used during the test).  NUTRITION  Drink plenty of fluids.   You may resume your normal diet.   Begin with a light meal and progress to your normal diet.   Avoid alcoholic beverages for 24 hours or as instructed by your caregiver.  MEDICATIONS  You may resume your normal medications unless your caregiver tells you otherwise.  WHAT YOU CAN EXPECT TODAY  You may experience abdominal discomfort such as a feeling of fullness or gas pains.  FOLLOW-UP  Your doctor will discuss the results of your test with you.  SEEK IMMEDIATE MEDICAL ATTENTION IF ANY OF THE FOLLOWING OCCUR:  Excessive nausea (feeling sick to your stomach) and/or vomiting.   Severe abdominal pain and distention (swelling).   Trouble swallowing.   Temperature over 101 F (37.8 C).   Rectal bleeding or vomiting of blood.     Continue current medical regimen for now.  Further recommendations to follow next week once I have the pathology report back to  review

## 2014-01-25 NOTE — Anesthesia Postprocedure Evaluation (Signed)
  Anesthesia Post-op Note  Patient: Brian [name]  Procedure(s) Performed: Procedure(s) with comments: ESOPHAGOGASTRODUODENOSCOPY (EGD) WITH PROPOFOL (N/A) ESOPHAGEAL DILATION (N/A) - Malony 56 french, no heme noted after dilation  Patient Location: PACU  Anesthesia Type:MAC  Level of Consciousness: awake, alert , oriented and patient cooperative  Airway and Oxygen Therapy: Patient Spontanous Breathing and Patient connected to nasal cannula oxygen  Post-op Pain: none  Post-op Assessment: Post-op Vital signs reviewed, Patient's Cardiovascular Status Stable, Respiratory Function Stable, Patent Airway, No signs of Nausea or vomiting and Pain level controlled  Post-op Vital Signs: Reviewed and stable  Last Vitals:  Filed Vitals:   01/25/14 1053  BP: 107/54  Pulse: 75  Temp: 36.4 C  Resp: 15    Complications: No apparent anesthesia complications

## 2014-01-25 NOTE — Anesthesia Preprocedure Evaluation (Signed)
Anesthesia Evaluation  Patient identified by MRN, date of birth, ID band Patient awake    Reviewed: Allergy & Precautions, H&P , NPO status , Patient's Chart, lab work & pertinent test results  Airway Mallampati: I TM Distance: >3 FB Neck ROM: Full    Dental no notable dental hx. (+) Teeth Intact   Pulmonary Current Smoker,  breath sounds clear to auscultation  Pulmonary exam normal       Cardiovascular hypertension, Pt. on medications Rhythm:Regular Rate:Normal     Neuro/Psych  Headaches, Seizures -, Well Controlled,  negative psych ROS   GI/Hepatic GERD-  Medicated and Controlled,  Endo/Other  diabetes, Well Controlled, Type 2, Oral Hypoglycemic Agents  Renal/GU      Musculoskeletal negative musculoskeletal ROS (+)   Abdominal Normal abdominal exam  (+)   Peds  Hematology negative hematology ROS (+)   Anesthesia Other Findings   Reproductive/Obstetrics                           Anesthesia Physical Anesthesia Plan  ASA: III  Anesthesia Plan: MAC   Post-op Pain Management:    Induction: Intravenous  Airway Management Planned: Simple Face Mask  Additional Equipment:   Intra-op Plan:   Post-operative Plan:   Informed Consent: I have reviewed the patients History and Physical, chart, labs and discussed the procedure including the risks, benefits and alternatives for the proposed anesthesia with the patient or authorized representative who has indicated his/her understanding and acceptance.     Plan Discussed with:   Anesthesia Plan Comments:         Anesthesia Quick Evaluation

## 2014-01-25 NOTE — Anesthesia Procedure Notes (Signed)
Procedure Name: MAC Date/Time: 01/25/2014 10:22 AM Performed by: Andree Elk, AMY A Pre-anesthesia Checklist: Patient identified, Timeout performed, Emergency Drugs available, Suction available and Patient being monitored Oxygen Delivery Method: Simple face mask

## 2014-01-25 NOTE — Transfer of Care (Signed)
Immediate Anesthesia Transfer of Care Note  Patient: Brian [name] Husband  Procedure(s) Performed: Procedure(s) with comments: ESOPHAGOGASTRODUODENOSCOPY (EGD) WITH PROPOFOL (N/A) ESOPHAGEAL DILATION (N/A) - Malony 56 french, no heme noted after dilation  Patient Location: PACU  Anesthesia Type:MAC  Level of Consciousness: awake, alert , oriented and patient cooperative  Airway & Oxygen Therapy: Patient Spontanous Breathing and Patient connected to nasal cannula oxygen  Post-op Assessment: Report given to PACU RN and Post -op Vital signs reviewed and stable  Post vital signs: Reviewed and stable  Complications: No apparent anesthesia complications

## 2014-01-25 NOTE — Interval H&P Note (Signed)
History and Physical Interval Note:  01/25/2014 10:11 AM  Brian [name]  has presented today for surgery, with the diagnosis of IBS, GERD, DYSPHAGIA AND CHRONIC ABDOMINAL PAIN  The various methods of treatment have been discussed with the patient and family. After consideration of risks, benefits and other options for treatment, the patient has consented to  Procedure(s): ESOPHAGOGASTRODUODENOSCOPY (EGD) WITH PROPOFOL (N/A) as a surgical intervention .  The patient's history has been reviewed, patient examined, no change in status, stable for surgery.  I have reviewed the patient's chart and labs.  Questions were answered to the patient's satisfaction.    No change. Recurrent esophageal dysphagia. Esophageal dilation benefited him previously. He desires to have it again. This was discussed with the patient prior to the procedure.The risks, benefits, limitations, alternatives and imponderables have been reviewed with the patient. Potential for esophageal dilation, biopsy, etc. have also been reviewed.  Questions have been answered. All parties agreeable.   [name] Rourk
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
## 2014-01-26 ENCOUNTER — Encounter (HOSPITAL_COMMUNITY): Payer: Self-pay | Admitting: Internal Medicine

## 2014-01-27 ENCOUNTER — Encounter: Payer: Self-pay | Admitting: Internal Medicine

## 2014-01-30 ENCOUNTER — Encounter: Payer: Self-pay | Admitting: Internal Medicine

## 2014-02-15 ENCOUNTER — Encounter: Payer: Self-pay | Admitting: *Deleted

## 2014-03-13 ENCOUNTER — Ambulatory Visit: Payer: Medicare HMO | Admitting: Gastroenterology

## 2014-03-20 ENCOUNTER — Ambulatory Visit: Payer: Medicare HMO | Admitting: Gastroenterology

## 2014-03-30 ENCOUNTER — Emergency Department (HOSPITAL_COMMUNITY): Payer: Medicare HMO

## 2014-03-30 ENCOUNTER — Encounter (HOSPITAL_COMMUNITY): Payer: Self-pay | Admitting: Emergency Medicine

## 2014-03-30 ENCOUNTER — Emergency Department (HOSPITAL_COMMUNITY)
Admission: EM | Admit: 2014-03-30 | Discharge: 2014-03-30 | Disposition: A | Discharge status: Home / Self Care | Payer: Medicare HMO | Attending: Emergency Medicine | Admitting: Emergency Medicine

## 2014-03-30 DIAGNOSIS — Z79899 Other long term (current) drug therapy: Secondary | ICD-10-CM | POA: Diagnosis not present

## 2014-03-30 DIAGNOSIS — IMO0002 Reserved for concepts with insufficient information to code with codable children: Secondary | ICD-10-CM | POA: Insufficient documentation

## 2014-03-30 DIAGNOSIS — Z8619 Personal history of other infectious and parasitic diseases: Secondary | ICD-10-CM | POA: Insufficient documentation

## 2014-03-30 DIAGNOSIS — Y9389 Activity, other specified: Secondary | ICD-10-CM | POA: Insufficient documentation

## 2014-03-30 DIAGNOSIS — W010XXA Fall on same level from slipping, tripping and stumbling without subsequent striking against object, initial encounter: Secondary | ICD-10-CM | POA: Insufficient documentation

## 2014-03-30 DIAGNOSIS — F329 Major depressive disorder, single episode, unspecified: Secondary | ICD-10-CM | POA: Insufficient documentation

## 2014-03-30 DIAGNOSIS — Y9289 Other specified places as the place of occurrence of the external cause: Secondary | ICD-10-CM | POA: Insufficient documentation

## 2014-03-30 DIAGNOSIS — E119 Type 2 diabetes mellitus without complications: Secondary | ICD-10-CM | POA: Diagnosis not present

## 2014-03-30 DIAGNOSIS — K219 Gastro-esophageal reflux disease without esophagitis: Secondary | ICD-10-CM | POA: Diagnosis not present

## 2014-03-30 DIAGNOSIS — I10 Essential (primary) hypertension: Secondary | ICD-10-CM | POA: Diagnosis not present

## 2014-03-30 DIAGNOSIS — K589 Irritable bowel syndrome without diarrhea: Secondary | ICD-10-CM | POA: Diagnosis not present

## 2014-03-30 DIAGNOSIS — E7800 Pure hypercholesterolemia, unspecified: Secondary | ICD-10-CM | POA: Insufficient documentation

## 2014-03-30 DIAGNOSIS — F3289 Other specified depressive episodes: Secondary | ICD-10-CM | POA: Insufficient documentation

## 2014-03-30 DIAGNOSIS — W138XXA Fall from, out of or through other building or structure, initial encounter: Secondary | ICD-10-CM | POA: Insufficient documentation

## 2014-03-30 DIAGNOSIS — S2249XA Multiple fractures of ribs, unspecified side, initial encounter for closed fracture: Secondary | ICD-10-CM | POA: Diagnosis not present

## 2014-03-30 DIAGNOSIS — Z7982 Long term (current) use of aspirin: Secondary | ICD-10-CM | POA: Insufficient documentation

## 2014-03-30 DIAGNOSIS — G40909 Epilepsy, unspecified, not intractable, without status epilepticus: Secondary | ICD-10-CM | POA: Insufficient documentation

## 2014-03-30 DIAGNOSIS — K3184 Gastroparesis: Secondary | ICD-10-CM | POA: Diagnosis not present

## 2014-03-30 DIAGNOSIS — F17200 Nicotine dependence, unspecified, uncomplicated: Secondary | ICD-10-CM | POA: Insufficient documentation

## 2014-03-30 DIAGNOSIS — F411 Generalized anxiety disorder: Secondary | ICD-10-CM | POA: Insufficient documentation

## 2014-03-30 DIAGNOSIS — Z860100 Personal history of colon polyps, unspecified: Secondary | ICD-10-CM | POA: Insufficient documentation

## 2014-03-30 DIAGNOSIS — S298XXA Other specified injuries of thorax, initial encounter: Secondary | ICD-10-CM | POA: Insufficient documentation

## 2014-03-30 DIAGNOSIS — S2231XA Fracture of one rib, right side, initial encounter for closed fracture: Secondary | ICD-10-CM

## 2014-03-30 DIAGNOSIS — G8929 Other chronic pain: Secondary | ICD-10-CM | POA: Insufficient documentation

## 2014-03-30 LAB — COMPREHENSIVE METABOLIC PANEL
ALT: 86 U/L — ABNORMAL HIGH (ref 0–53)
AST: 49 U/L — ABNORMAL HIGH (ref 0–37)
Albumin: 4.5 g/dL (ref 3.5–5.2)
Alkaline Phosphatase: 65 U/L (ref 39–117)
Anion gap: 16 — ABNORMAL HIGH (ref 5–15)
BUN: 18 mg/dL (ref 6–23)
CO2: 21 mEq/L (ref 19–32)
Calcium: 9.8 mg/dL (ref 8.4–10.5)
Chloride: 108 mEq/L (ref 96–112)
Creatinine, Ser: 1.47 mg/dL — ABNORMAL HIGH (ref 0.50–1.35)
GFR calc Af Amer: 59 mL/min — ABNORMAL LOW (ref >90)
GFR calc non Af Amer: 51 mL/min — ABNORMAL LOW (ref >90)
Glucose, Bld: 114 mg/dL — ABNORMAL HIGH (ref 70–99)
Potassium: 4.2 mEq/L (ref 3.7–5.3)
Sodium: 145 mEq/L (ref 137–147)
Total Bilirubin: 0.3 mg/dL (ref 0.3–1.2)
Total Protein: 7.6 g/dL (ref 6.0–8.3)

## 2014-03-30 LAB — CBC WITH DIFFERENTIAL/PLATELET
Basophils Absolute: 0.1 10*3/uL (ref 0.0–0.1)
Basophils Relative: 0 % (ref 0–1)
Eosinophils Absolute: 0.4 10*3/uL (ref 0.0–0.7)
Eosinophils Relative: 3 % (ref 0–5)
HCT: 40.1 % (ref 39.0–52.0)
Hemoglobin: 13.8 g/dL (ref 13.0–17.0)
Lymphocytes Relative: 20 % (ref 12–46)
Lymphs Abs: 2.5 10*3/uL (ref 0.7–4.0)
MCH: 32.4 pg (ref 26.0–34.0)
MCHC: 34.4 g/dL (ref 30.0–36.0)
MCV: 94.1 fL (ref 78.0–100.0)
Monocytes Absolute: 1 10*3/uL (ref 0.1–1.0)
Monocytes Relative: 8 % (ref 3–12)
Neutro Abs: 8.3 10*3/uL — ABNORMAL HIGH (ref 1.7–7.7)
Neutrophils Relative %: 69 % (ref 43–77)
Platelets: 211 10*3/uL (ref 150–400)
RBC: 4.26 MIL/uL (ref 4.22–5.81)
RDW: 13.3 % (ref 11.5–15.5)
WBC: 12.2 10*3/uL — ABNORMAL HIGH (ref 4.0–10.5)

## 2014-03-30 MED ORDER — ONDANSETRON HCL 4 MG/2ML IJ SOLN
4.0000 mg | Freq: Once | INTRAMUSCULAR | Status: AC
  Administered 2014-03-30: 4 mg via INTRAVENOUS

## 2014-03-30 MED ORDER — OXYCODONE-ACETAMINOPHEN 5-325 MG PO TABS: 1.0000 | ORAL_TABLET | Freq: Four times a day (QID) | ORAL | Status: DC | PRN

## 2014-03-30 MED ORDER — HYDROMORPHONE HCL PF 1 MG/ML IJ SOLN
1.0000 mg | Freq: Once | INTRAMUSCULAR | Status: AC
  Administered 2014-03-30: 1 mg via INTRAVENOUS
  Filled 2014-03-30: qty 1

## 2014-03-30 MED ORDER — IOHEXOL 300 MG/ML  SOLN
100.0000 mL | Freq: Once | INTRAMUSCULAR | Status: AC | PRN
  Administered 2014-03-30: 100 mL via INTRAVENOUS

## 2014-03-30 MED ORDER — ONDANSETRON HCL 4 MG/2ML IJ SOLN
4.0000 mg | Freq: Once | INTRAMUSCULAR | Status: DC
  Filled 2014-03-30: qty 2

## 2014-03-30 NOTE — Discharge Instructions (Signed)
Follow up with your family md for recheck next week.

## 2014-03-30 NOTE — ED Notes (Signed)
Pt was on top of 9 foot roof when he slipped and fell off the roof landing on his back. Pain in center of back radiating to right side. Pt reports "I heard a pop when I hit the ground"

## 2014-03-30 NOTE — ED Provider Notes (Signed)
CSN: [id number]     Arrival date & time 03/30/14  1315 History  This chart was scribed for Maudry Diego, MD by Lowella Petties, ED Scribe. The patient was seen in room APA11/APA11. Patient's care was started at 1:37 PM.   Chief Complaint  Patient presents with  . Fall   Patient is a 58 y.o. male presenting with fall. The history is provided by the patient. No language interpreter was used.  Fall This is a recurrent [place] once in 1982 off a ladder) problem. The current episode started 3 to 5 hours ago. The problem occurs rarely. The problem has not changed since onset.Pertinent negatives include no abdominal pain, no headaches and no shortness of breath. The symptoms are aggravated by bending and twisting. The symptoms are relieved by relaxation and position.   HPI Comments: Brian [name] is a 58 y.o. male who presents to the Emergency Department after falling off of his roof and landing on his back on muddy ground 4 hours ago. He states that he is not sure if he experienced LOC. He reports getting up on his own after 15 minutes on the ground. He reports hearing a pop when he hit the ground. He reports finding blood on his ear, but not knowing where it came from. He reports severe pain in his lower back that radiates to his right sided upon palpation.  Past Medical History  Diagnosis Date  . DM (diabetes mellitus)   . HTN (hypertension)   . IBS (irritable bowel syndrome)   . GERD (gastroesophageal reflux disease)   . High cholesterol   . Chronic abdominal pain 08/11/11    Reported per patient, Dr. Fabio Neighbors in [place]  . Adenomatous colon polyp     due for surveillance 2016 per epic notes, performed at outside facility   . Headache(784.0)     migraines  . Gastroparesis   . Chronic diarrhea   . Diverticulosis   . H. pylori infection 2003    pt lived in [place], treated with prevpac.  . H/O Clostridium difficile infection 12/2012  . Anxiety   . Depression   . Shortness of breath   .  Sleep apnea     not using cpap  . Seizures     1 seizure with severe back pain. no further meds or seizures  . HOH (hard of hearing)    Past Surgical History  Procedure Laterality Date  . Tonsillectomy    . Arm surgery      tendon/left  . Esophagogastroduodenoscopy  09/02/10    [facility], Dr. 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 pylori  . Esophagogastroduodenoscopy  02/03/2006    Dr. Ileene Rubens White-> hiatal hernia, atrial erosions  . Cholecystectomy  11/20/2011    Procedure: LAPAROSCOPIC CHOLECYSTECTOMY;  Surgeon: Donato Heinz, MD;  Location: [facility];  Service: General;  Laterality: N/A;  . Liver biopsy  11/20/2011    benign  . Esophagogastroduodenoscopy  01/21/2012    [id number] lesion at arytenoid cartilage on the right-likely explains some of his oro- pharyngeal symptoms/Hiatal hernia/Schatzki's ring s/p dilation, gastric erosions without H.pylori  . Larynx surgery      cyst removed, [facility] [place]  . Vena cava filter placement    . Colonoscopy  2012    Dr. Rhunette Croft, [place].pt gives history of adenomatous polyps and  says he is due for repeat colonoscopy in 3 years.   . Colonoscopy with propofol N/A 04/13/2013    KVQ:[id number] diverticulosis. Single colonic polyp, hyperplastic. Surveillance 2019.   Marland Kitchen Esophagogastroduodenoscopy (egd) with propofol N/A 01/25/2014    Dr. Gala Romney- abnormal distal esophagus s/p passage of maloney dilator, hiatal hernia, stomach bx= mild chronic inflammation, esophagus bx= benign squamous mucosa  . Esophageal dilation N/A 01/25/2014    Procedure: ESOPHAGEAL DILATION;  Surgeon: Daneil Dolin, MD;  Location: [facility];  Service: Endoscopy;  Laterality: N/A;  Malony 42 french, no heme noted after dilation   Family History  Problem Relation Age of Onset  .  Cancer Father 69    Gallbladder  . Anesthesia problems Neg Hx   . Hypotension Neg Hx   . Malignant hyperthermia Neg Hx   . Pseudochol deficiency Neg Hx   . Diabetes Mother   . GER disease Mother    History  Substance Use Topics  . Smoking status: Current Every Day Smoker -- 1.00 packs/day for 42 years    Types: Cigarettes  . Smokeless tobacco: Not on file  . Alcohol Use: Yes     Comment: occasionally    Review of Systems  Constitutional: Negative for appetite change and fatigue.  HENT: Negative for congestion, ear discharge and sinus pressure.   Eyes: Negative for discharge.  Respiratory: Negative for cough and shortness of breath.   Gastrointestinal: Negative for abdominal pain and diarrhea.  Genitourinary: Negative for frequency and hematuria.  Musculoskeletal: Positive for arthralgias (right sided chest pain) and back pain.  Skin: Negative for rash.  Neurological: Negative for seizures and headaches.  Psychiatric/Behavioral: Negative for hallucinations.      Allergies  Ketorolac tromethamine; Sulfamethoxazole; and Tetracyclines & related  Home Medications   Prior to Admission medications   Medication Sig Start Date End Date Taking? Authorizing Provider  ACETAMINOPHEN-BUTALBITAL (BUPAP) 50-650 MG TABS Take 1 tablet by mouth every 6 (six) hours as needed. For headaches    Historical Provider, MD  amitriptyline (ELAVIL) 100 MG tablet Take 100 mg by mouth at bedtime.    Historical Provider, MD  amLODipine-valsartan (EXFORGE) 5-160 MG per tablet Take 1 tablet by mouth daily.     Historical Provider, MD  aspirin 81 MG tablet Take 81 mg by mouth daily.    Historical Provider, MD  BIOTIN PO Take 10,000 mg by mouth daily.    Historical Provider, MD  cyclobenzaprine (FLEXERIL) 5 MG tablet Take 5 mg by mouth 3 (three) times daily as needed for muscle spasms.    Historical Provider, MD  dicyclomine (BENTYL) 20 MG tablet Take 1 tablet (20 mg total) by mouth 2 (two) times daily  before a meal. 01/08/14   Mahala Menghini, PA-C  diphenhydrAMINE (BENADRYL) 25 MG tablet Take 25 mg by mouth 2 (two) times daily.    Historical Provider, MD  DULoxetine (CYMBALTA) 60 MG capsule Take 60 mg by mouth daily.     Historical Provider, MD  gabapentin (NEURONTIN) 600 MG tablet Take 1,800 mg by mouth 3 (three) times daily.     Historical Provider, MD  metFORMIN (GLUCOPHAGE) 500 MG tablet Take 500 mg by mouth 2 (two) times daily with a meal. Restart on 6/2 01/29/13   Kathie Dike, MD  metoCLOPramide (REGLAN) 10 MG tablet Take 10 mg by mouth 2 (two) times daily.    Historical Provider, MD  MILK THISTLE PO Take 1,000 mg by mouth every evening.     Historical Provider, MD  morphine (MS CONTIN) 15 MG 12 hr tablet Take 15 mg by mouth daily as needed. pain 11/02/13   Historical Provider, MD  Multiple Vitamin (MULTIVITAMIN) capsule Take 1 capsule by mouth daily.    Historical Provider, MD  Niacin-Simvastatin [facility]) 500-40 MG TB24 Take 1 tablet by mouth every evening.     Historical Provider, MD  ondansetron (ZOFRAN) 4 MG tablet TAKE 1 TABLET BY MOUTH TWICE DAILY 11/02/13   Orvil Feil, NP  pantoprazole (PROTONIX) 40 MG tablet Take 1 tablet (40 mg total) by mouth 2 (two) times daily. 04/07/12   Andria Meuse, NP  Probiotic Product (PROBIOTIC DAILY PO) Take by mouth daily.    Historical Provider, MD  ranitidine (ZANTAC) 150 MG tablet Take 150 mg by mouth at bedtime.  11/10/12   Historical Provider, MD  simvastatin (ZOCOR) 80 MG tablet  12/28/13   Historical Provider, MD  sucralfate (CARAFATE) 1 G tablet Take 1 g by mouth 4 (four) times daily.    Historical Provider, MD  topiramate (TOPAMAX) 100 MG tablet Take 100 mg by mouth 2 (two) times daily.    Historical Provider, MD  VIAGRA 100 MG tablet Take 100 mg by mouth daily as needed. For intercourse 04/04/13   Historical Provider, MD  vitamin B-12 (CYANOCOBALAMIN) 1000 MCG tablet Take 1,000 mcg by mouth 2 (two) times daily.    Historical Provider, MD  vitamin  E 400 UNIT capsule Take 400 Units by mouth 2 (two) times daily.    Historical Provider, MD   Triage Vitals:BP 141/73  Pulse 110  Temp(Src) 99 F (37.2 C) (Oral)  Resp 20  Ht 6' (1.829 m)  Wt 226 lb (102.513 kg)  BMI 30.64 kg/m2  SpO2 95% Physical Exam  Constitutional: He is oriented to person, place, and time. He appears well-developed.  HENT:  Head: Normocephalic.  Eyes: Conjunctivae and EOM are normal. No scleral icterus.  Neck: Neck supple. No thyromegaly present.  Cardiovascular: Normal rate and regular rhythm.  Exam reveals no gallop and no friction rub.   No murmur heard. Pulmonary/Chest: No stridor. He has no wheezes. He has no rales. He exhibits no tenderness.  Abdominal: He exhibits no distension. There is no tenderness. There is no rebound.  Musculoskeletal: Normal range of motion. He exhibits tenderness. He exhibits no edema.  Severe mid thoracic spinal tenderness and right lateral chest tenderness.   Lymphadenopathy:    He has no cervical adenopathy.  Neurological: He is oriented to person, place, and time. He exhibits normal muscle tone. Coordination normal.  Skin: No rash noted. No erythema.  Psychiatric: He has a normal mood and affect. His behavior is normal.    ED Course  Procedures (including critical care time) DIAGNOSTIC STUDIES: Oxygen Saturation is 95% on room air, adequate by my interpretation.    COORDINATION OF CARE: 1:43 PM-Discussed treatment plan which includes X-ray with pt at bedside and pt agreed to plan.   Labs Review Labs Reviewed - No data to display  Imaging Review No results found.   EKG Interpretation None     I spoke with trauma surgeon and the pt only needs follow up with pcp MDM   Final diagnoses:  None    The chart was scribed for me under my direct supervision.  I personally performed the history, physical, and medical decision making and all procedures in the evaluation of this patient.Maudry Diego,  MD 03/30/14 [phone]

## 2014-03-30 NOTE — ED Notes (Signed)
Patient with no complaints at this time. Respirations even and unlabored. Skin warm/dry. Discharge instructions reviewed with patient at this time. Patient given opportunity to voice concerns/ask questions. IV removed per policy and band-aid applied to site. Patient discharged at this time and left Emergency Department with steady gait.  

## 2014-04-25 ENCOUNTER — Encounter: Payer: Self-pay | Admitting: Gastroenterology

## 2014-04-25 ENCOUNTER — Other Ambulatory Visit: Payer: Self-pay | Admitting: Gastroenterology

## 2014-04-25 ENCOUNTER — Ambulatory Visit (INDEPENDENT_AMBULATORY_CARE_PROVIDER_SITE_OTHER): Payer: Medicare HMO | Admitting: Gastroenterology

## 2014-04-25 VITALS — BP 125/81 | HR 94 | Temp 98.8°F | Ht 72.0 in | Wt 225.6 lb

## 2014-04-25 DIAGNOSIS — R7989 Other specified abnormal findings of blood chemistry: Secondary | ICD-10-CM | POA: Insufficient documentation

## 2014-04-25 DIAGNOSIS — K7689 Other specified diseases of liver: Secondary | ICD-10-CM

## 2014-04-25 DIAGNOSIS — K760 Fatty (change of) liver, not elsewhere classified: Secondary | ICD-10-CM

## 2014-04-25 DIAGNOSIS — G8929 Other chronic pain: Secondary | ICD-10-CM

## 2014-04-25 DIAGNOSIS — R109 Unspecified abdominal pain: Secondary | ICD-10-CM

## 2014-04-25 DIAGNOSIS — K219 Gastro-esophageal reflux disease without esophagitis: Secondary | ICD-10-CM

## 2014-04-25 DIAGNOSIS — K5900 Constipation, unspecified: Secondary | ICD-10-CM | POA: Insufficient documentation

## 2014-04-25 DIAGNOSIS — R945 Abnormal results of liver function studies: Secondary | ICD-10-CM

## 2014-04-25 HISTORY — DX: Other specified abnormal findings of blood chemistry: R79.89

## 2014-04-25 MED ORDER — LUBIPROSTONE 24 MCG PO CAPS: 24.0000 ug | ORAL_CAPSULE | Freq: Two times a day (BID) | ORAL | Status: DC

## 2014-04-25 NOTE — Patient Instructions (Signed)
1. Start amitiza 21mcg, one capsule twice a day with food for constipation. This will also help with some associated abdominal pain. Try weaning off Bentyl if you find this medication is helpful. 2. Your liver labs were slightly elevated recently in the ER. I recommend you have some baseline labs to evaluate. Liver look ok on CT, we know you have some fatty liver from prior biopsy.
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
## 2014-04-26 LAB — HEPATIC FUNCTION PANEL
ALT: 43 U/L (ref 0–53)
AST: 25 U/L (ref 0–37)
Albumin: 4.6 g/dL (ref 3.5–5.2)
Alkaline Phosphatase: 105 U/L (ref 39–117)
Bilirubin, Direct: 0.1 mg/dL (ref 0.0–0.3)
Indirect Bilirubin: 0.3 mg/dL (ref 0.2–1.2)
Total Bilirubin: 0.4 mg/dL (ref 0.2–1.2)
Total Protein: 7.3 g/dL (ref 6.0–8.3)

## 2014-04-26 LAB — HEPATITIS C ANTIBODY: HCV Ab: NEGATIVE

## 2014-04-26 LAB — IRON AND TIBC
%SAT: 23 % (ref 20–55)
Iron: 76 ug/dL (ref 42–165)
TIBC: 324 ug/dL (ref 215–435)
UIBC: 248 ug/dL (ref 125–400)

## 2014-04-26 LAB — HEPATITIS B SURFACE ANTIBODY,QUALITATIVE: Hep B S Ab: NEGATIVE

## 2014-04-26 LAB — FERRITIN: Ferritin: 136 ng/mL (ref 22–322)

## 2014-04-27 NOTE — Assessment & Plan Note (Signed)
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 Bentyl has helped cramps in past. Now with complaints of constipation. We will wean off Bentyl. Add Amitiza 79mcg once to twice daily with food for constipation. He will call if needs assistance with dose adjustment. Continue with pain mgt clinic.

## 2014-04-27 NOTE — Assessment & Plan Note (Signed)
Continue current regimen. Anti reflux measures.
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
## 2014-05-01 LAB — HEPATITIS B SURFACE ANTIGEN: Hepatitis B Surface Ag: NEGATIVE

## 2014-05-01 NOTE — Progress Notes (Signed)
Quick Note:  Hep C negative. LFTs normal. Can we add on a Hep B surface antigen (the antibody got done instead)?  ______

## 2014-05-01 NOTE — Progress Notes (Signed)
Quick Note:  I called [facility] and spoke to Combs and had the Hep B antigen added. ______

## 2014-05-02 NOTE — Progress Notes (Signed)
Quick Note:  Please let patient know all his liver labs look good. No evidence of hepatitis. ______

## 2014-05-04 NOTE — Progress Notes (Signed)
Quick Note:  We checked for hepatitis due to abnormal LFTs recently.  Those with fatty liver should have labs done yearly and at some point require updated u/s to check for advancing disease.  Recommend OV in one year for fatty liver. ______

## 2014-05-04 NOTE — Progress Notes (Signed)
Cc to pcp °

## 2014-05-08 NOTE — Progress Notes (Signed)
PATIENT NIC'D  °

## 2014-06-04 ENCOUNTER — Other Ambulatory Visit: Payer: Self-pay | Admitting: Gastroenterology

## 2014-09-05 ENCOUNTER — Other Ambulatory Visit: Payer: Self-pay

## 2014-09-05 NOTE — Telephone Encounter (Signed)
Pt called- he needs to have all of his rx's sent to [facility] mail order. We currently give him: dicyclomine 20mg , amitiza 24 mcg, odansetron 4mg ,  Pantoprazole 40mg . The pt was told that he could get it cheaper thru mail order and is requesting that we send all of his rx's to [facility] mail order.

## 2014-09-06 MED ORDER — PANTOPRAZOLE SODIUM 40 MG PO TBEC: 40.0000 mg | DELAYED_RELEASE_TABLET | Freq: Two times a day (BID) | ORAL | Status: DC

## 2014-09-06 MED ORDER — ONDANSETRON HCL 4 MG PO TABS: 4.0000 mg | ORAL_TABLET | Freq: Two times a day (BID) | ORAL | Status: DC | PRN

## 2014-09-06 MED ORDER — DICYCLOMINE HCL 20 MG PO TABS: 20.0000 mg | ORAL_TABLET | Freq: Two times a day (BID) | ORAL | Status: DC

## 2014-09-06 MED ORDER — LUBIPROSTONE 24 MCG PO CAPS: 24.0000 ug | ORAL_CAPSULE | Freq: Two times a day (BID) | ORAL | Status: DC

## 2014-09-06 NOTE — Telephone Encounter (Signed)
Done

## 2014-12-25 ENCOUNTER — Encounter (HOSPITAL_COMMUNITY): Payer: Self-pay | Admitting: Emergency Medicine

## 2014-12-25 ENCOUNTER — Emergency Department (HOSPITAL_COMMUNITY): Payer: Medicare HMO

## 2014-12-25 ENCOUNTER — Observation Stay (HOSPITAL_COMMUNITY)
Admission: EM | Admit: 2014-12-25 | Discharge: 2014-12-27 | Disposition: A | Discharge status: Home / Self Care | Payer: Medicare HMO | Attending: Internal Medicine | Admitting: Internal Medicine

## 2014-12-25 DIAGNOSIS — K589 Irritable bowel syndrome without diarrhea: Secondary | ICD-10-CM | POA: Diagnosis not present

## 2014-12-25 DIAGNOSIS — Y9389 Activity, other specified: Secondary | ICD-10-CM | POA: Insufficient documentation

## 2014-12-25 DIAGNOSIS — H9190 Unspecified hearing loss, unspecified ear: Secondary | ICD-10-CM | POA: Insufficient documentation

## 2014-12-25 DIAGNOSIS — W01198A Fall on same level from slipping, tripping and stumbling with subsequent striking against other object, initial encounter: Secondary | ICD-10-CM | POA: Insufficient documentation

## 2014-12-25 DIAGNOSIS — Z8619 Personal history of other infectious and parasitic diseases: Secondary | ICD-10-CM | POA: Insufficient documentation

## 2014-12-25 DIAGNOSIS — R55 Syncope and collapse: Secondary | ICD-10-CM | POA: Diagnosis present

## 2014-12-25 DIAGNOSIS — G4730 Sleep apnea, unspecified: Secondary | ICD-10-CM | POA: Diagnosis not present

## 2014-12-25 DIAGNOSIS — Z7982 Long term (current) use of aspirin: Secondary | ICD-10-CM | POA: Diagnosis not present

## 2014-12-25 DIAGNOSIS — K219 Gastro-esophageal reflux disease without esophagitis: Secondary | ICD-10-CM | POA: Insufficient documentation

## 2014-12-25 DIAGNOSIS — Y9289 Other specified places as the place of occurrence of the external cause: Secondary | ICD-10-CM | POA: Insufficient documentation

## 2014-12-25 DIAGNOSIS — S0101XA Laceration without foreign body of scalp, initial encounter: Secondary | ICD-10-CM | POA: Diagnosis present

## 2014-12-25 DIAGNOSIS — G8929 Other chronic pain: Secondary | ICD-10-CM | POA: Diagnosis not present

## 2014-12-25 DIAGNOSIS — G40909 Epilepsy, unspecified, not intractable, without status epilepticus: Secondary | ICD-10-CM

## 2014-12-25 DIAGNOSIS — F419 Anxiety disorder, unspecified: Secondary | ICD-10-CM | POA: Diagnosis not present

## 2014-12-25 DIAGNOSIS — Z86018 Personal history of other benign neoplasm: Secondary | ICD-10-CM | POA: Insufficient documentation

## 2014-12-25 DIAGNOSIS — Z720 Tobacco use: Secondary | ICD-10-CM | POA: Diagnosis not present

## 2014-12-25 DIAGNOSIS — Z79899 Other long term (current) drug therapy: Secondary | ICD-10-CM

## 2014-12-25 DIAGNOSIS — F329 Major depressive disorder, single episode, unspecified: Secondary | ICD-10-CM | POA: Insufficient documentation

## 2014-12-25 DIAGNOSIS — Y998 Other external cause status: Secondary | ICD-10-CM | POA: Diagnosis not present

## 2014-12-25 DIAGNOSIS — G43909 Migraine, unspecified, not intractable, without status migrainosus: Secondary | ICD-10-CM | POA: Diagnosis not present

## 2014-12-25 DIAGNOSIS — S0990XA Unspecified injury of head, initial encounter: Principal | ICD-10-CM | POA: Insufficient documentation

## 2014-12-25 DIAGNOSIS — E669 Obesity, unspecified: Secondary | ICD-10-CM | POA: Diagnosis not present

## 2014-12-25 DIAGNOSIS — E119 Type 2 diabetes mellitus without complications: Secondary | ICD-10-CM

## 2014-12-25 DIAGNOSIS — E780 Pure hypercholesterolemia: Secondary | ICD-10-CM | POA: Insufficient documentation

## 2014-12-25 DIAGNOSIS — I10 Essential (primary) hypertension: Secondary | ICD-10-CM | POA: Insufficient documentation

## 2014-12-25 DIAGNOSIS — Z23 Encounter for immunization: Secondary | ICD-10-CM | POA: Insufficient documentation

## 2014-12-25 DIAGNOSIS — R109 Unspecified abdominal pain: Secondary | ICD-10-CM | POA: Diagnosis present

## 2014-12-25 DIAGNOSIS — I517 Cardiomegaly: Secondary | ICD-10-CM | POA: Diagnosis present

## 2014-12-25 DIAGNOSIS — M792 Neuralgia and neuritis, unspecified: Secondary | ICD-10-CM | POA: Diagnosis present

## 2014-12-25 DIAGNOSIS — K3184 Gastroparesis: Secondary | ICD-10-CM | POA: Diagnosis present

## 2014-12-25 HISTORY — DX: Neuralgia and neuritis, unspecified: M79.2

## 2014-12-25 HISTORY — DX: Obesity, unspecified: E66.9

## 2014-12-25 HISTORY — DX: Syncope and collapse: R55

## 2014-12-25 HISTORY — DX: Epilepsy, unspecified, not intractable, without status epilepticus: G40.909

## 2014-12-25 LAB — CBC WITH DIFFERENTIAL/PLATELET
Basophils Absolute: 0.1 10*3/uL (ref 0.0–0.1)
Basophils Relative: 1 % (ref 0–1)
Eosinophils Absolute: 1.4 10*3/uL — ABNORMAL HIGH (ref 0.0–0.7)
Eosinophils Relative: 17 % — ABNORMAL HIGH (ref 0–5)
HCT: 39.7 % (ref 39.0–52.0)
Hemoglobin: 13 g/dL (ref 13.0–17.0)
Lymphocytes Relative: 29 % (ref 12–46)
Lymphs Abs: 2.3 10*3/uL (ref 0.7–4.0)
MCH: 31.9 pg (ref 26.0–34.0)
MCHC: 32.7 g/dL (ref 30.0–36.0)
MCV: 97.3 fL (ref 78.0–100.0)
Monocytes Absolute: 0.7 10*3/uL (ref 0.1–1.0)
Monocytes Relative: 8 % (ref 3–12)
Neutro Abs: 3.6 10*3/uL (ref 1.7–7.7)
Neutrophils Relative %: 45 % (ref 43–77)
Platelets: 193 10*3/uL (ref 150–400)
RBC: 4.08 MIL/uL — ABNORMAL LOW (ref 4.22–5.81)
RDW: 13.2 % (ref 11.5–15.5)
WBC: 8 10*3/uL (ref 4.0–10.5)

## 2014-12-25 LAB — COMPREHENSIVE METABOLIC PANEL
ALT: 28 U/L (ref 0–53)
AST: 23 U/L (ref 0–37)
Albumin: 4 g/dL (ref 3.5–5.2)
Alkaline Phosphatase: 53 U/L (ref 39–117)
Anion gap: 8 (ref 5–15)
BUN: 14 mg/dL (ref 6–23)
CO2: 24 mmol/L (ref 19–32)
Calcium: 8.8 mg/dL (ref 8.4–10.5)
Chloride: 108 mmol/L (ref 96–112)
Creatinine, Ser: 1.26 mg/dL (ref 0.50–1.35)
GFR calc Af Amer: 71 mL/min — ABNORMAL LOW (ref >90)
GFR calc non Af Amer: 61 mL/min — ABNORMAL LOW (ref >90)
Glucose, Bld: 112 mg/dL — ABNORMAL HIGH (ref 70–99)
Potassium: 3.8 mmol/L (ref 3.5–5.1)
Sodium: 140 mmol/L (ref 135–145)
Total Bilirubin: 0.6 mg/dL (ref 0.3–1.2)
Total Protein: 6.7 g/dL (ref 6.0–8.3)

## 2014-12-25 LAB — TROPONIN I
Troponin I: <0.03 ng/mL (ref <0.031)
Troponin I: <0.03 ng/mL (ref <0.031)
Troponin I: <0.03 ng/mL (ref <0.031)

## 2014-12-25 LAB — CBG MONITORING, ED
Glucose-Capillary: 96 mg/dL (ref 70–99)
Glucose-Capillary: 75 mg/dL (ref 70–99)

## 2014-12-25 MED ORDER — SUCRALFATE 1 G PO TABS
1.0000 g | ORAL_TABLET | Freq: Four times a day (QID) | ORAL | Status: DC
  Administered 2014-12-25 – 2014-12-27 (×7): 1 g via ORAL
  Filled 2014-12-25 (×7): qty 1

## 2014-12-25 MED ORDER — HEPARIN SODIUM (PORCINE) 5000 UNIT/ML IJ SOLN
5000.0000 [IU] | Freq: Three times a day (TID) | INTRAMUSCULAR | Status: DC
  Administered 2014-12-25 – 2014-12-27 (×6): 5000 [IU] via SUBCUTANEOUS
  Filled 2014-12-25 (×5): qty 1

## 2014-12-25 MED ORDER — VITAMIN D (ERGOCALCIFEROL) 1.25 MG (50000 UNIT) PO CAPS: 50000.0000 [IU] | ORAL_CAPSULE | ORAL | Status: DC

## 2014-12-25 MED ORDER — SODIUM CHLORIDE 0.9 % IV BOLUS (SEPSIS)
1000.0000 mL | Freq: Once | INTRAVENOUS | Status: AC
  Administered 2014-12-25: 1000 mL via INTRAVENOUS

## 2014-12-25 MED ORDER — SODIUM CHLORIDE 0.9 % IJ SOLN
3.0000 mL | Freq: Two times a day (BID) | INTRAMUSCULAR | Status: DC
  Administered 2014-12-25 – 2014-12-27 (×3): 3 mL via INTRAVENOUS

## 2014-12-25 MED ORDER — VITAMIN E 180 MG (400 UNIT) PO CAPS
400.0000 [IU] | ORAL_CAPSULE | Freq: Every day | ORAL | Status: DC
  Administered 2014-12-25 – 2014-12-27 (×3): 400 [IU] via ORAL
  Filled 2014-12-25 (×4): qty 1

## 2014-12-25 MED ORDER — DIAZEPAM 5 MG PO TABS: 5.0000 mg | ORAL_TABLET | Freq: Four times a day (QID) | ORAL | Status: DC | PRN

## 2014-12-25 MED ORDER — AMLODIPINE BESYLATE 5 MG PO TABS
5.0000 mg | ORAL_TABLET | Freq: Every day | ORAL | Status: DC
Start: 2014-12-25 — End: 2014-12-27
  Administered 2014-12-26 – 2014-12-27 (×2): 5 mg via ORAL
  Filled 2014-12-25 (×3): qty 1

## 2014-12-25 MED ORDER — POLYVINYL ALCOHOL 1.4 % OP SOLN: 2.0000 [drp] | Freq: Every day | OPHTHALMIC | Status: DC | PRN

## 2014-12-25 MED ORDER — PANTOPRAZOLE SODIUM 40 MG PO TBEC
40.0000 mg | DELAYED_RELEASE_TABLET | Freq: Two times a day (BID) | ORAL | Status: DC
  Administered 2014-12-25 – 2014-12-27 (×4): 40 mg via ORAL
  Filled 2014-12-25 (×4): qty 1

## 2014-12-25 MED ORDER — TOPIRAMATE 100 MG PO TABS
100.0000 mg | ORAL_TABLET | Freq: Two times a day (BID) | ORAL | Status: DC
  Administered 2014-12-25 – 2014-12-27 (×4): 100 mg via ORAL
  Filled 2014-12-25 (×6): qty 1

## 2014-12-25 MED ORDER — DIPHENHYDRAMINE HCL 25 MG PO CAPS
25.0000 mg | ORAL_CAPSULE | Freq: Two times a day (BID) | ORAL | Status: DC
  Administered 2014-12-25 – 2014-12-27 (×4): 25 mg via ORAL
  Filled 2014-12-25 (×4): qty 1

## 2014-12-25 MED ORDER — INSULIN ASPART 100 UNIT/ML ~~LOC~~ SOLN: 0.0000 [IU] | Freq: Every day | SUBCUTANEOUS | Status: DC

## 2014-12-25 MED ORDER — INSULIN ASPART 100 UNIT/ML ~~LOC~~ SOLN
0.0000 [IU] | Freq: Three times a day (TID) | SUBCUTANEOUS | Status: DC
Start: 2014-12-25 — End: 2014-12-26

## 2014-12-25 MED ORDER — DICYCLOMINE HCL 10 MG PO CAPS
20.0000 mg | ORAL_CAPSULE | Freq: Two times a day (BID) | ORAL | Status: DC
Start: 2014-12-25 — End: 2014-12-26
  Administered 2014-12-25 – 2014-12-26 (×2): 20 mg via ORAL
  Filled 2014-12-25 (×2): qty 2

## 2014-12-25 MED ORDER — AMLODIPINE BESYLATE-VALSARTAN 5-160 MG PO TABS: 1.0000 | ORAL_TABLET | Freq: Every day | ORAL | Status: DC

## 2014-12-25 MED ORDER — METOCLOPRAMIDE HCL 10 MG PO TABS
10.0000 mg | ORAL_TABLET | Freq: Two times a day (BID) | ORAL | Status: DC
  Administered 2014-12-25 – 2014-12-26 (×2): 10 mg via ORAL
  Filled 2014-12-25 (×2): qty 1

## 2014-12-25 MED ORDER — ASPIRIN EC 81 MG PO TBEC
81.0000 mg | DELAYED_RELEASE_TABLET | Freq: Every day | ORAL | Status: DC
  Administered 2014-12-26 – 2014-12-27 (×2): 81 mg via ORAL
  Filled 2014-12-25 (×3): qty 1

## 2014-12-25 MED ORDER — DULOXETINE HCL 60 MG PO CPEP
60.0000 mg | ORAL_CAPSULE | Freq: Every day | ORAL | Status: DC
  Administered 2014-12-26 – 2014-12-27 (×2): 60 mg via ORAL
  Filled 2014-12-25 (×3): qty 1

## 2014-12-25 MED ORDER — ONDANSETRON HCL 4 MG PO TABS: 4.0000 mg | ORAL_TABLET | Freq: Four times a day (QID) | ORAL | Status: DC | PRN

## 2014-12-25 MED ORDER — PNEUMOCOCCAL VAC POLYVALENT 25 MCG/0.5ML IJ INJ
0.5000 mL | INJECTION | INTRAMUSCULAR | Status: AC
  Administered 2014-12-26: 0.5 mL via INTRAMUSCULAR
  Filled 2014-12-25: qty 0.5

## 2014-12-25 MED ORDER — ATORVASTATIN CALCIUM 40 MG PO TABS
40.0000 mg | ORAL_TABLET | Freq: Every day | ORAL | Status: DC
  Administered 2014-12-25 – 2014-12-26 (×2): 40 mg via ORAL
  Filled 2014-12-25 (×3): qty 1

## 2014-12-25 MED ORDER — VITAMIN B-12 1000 MCG PO TABS
1000.0000 ug | ORAL_TABLET | Freq: Every day | ORAL | Status: DC
  Administered 2014-12-25 – 2014-12-27 (×3): 1000 ug via ORAL
  Filled 2014-12-25 (×2): qty 1

## 2014-12-25 MED ORDER — ONDANSETRON HCL 4 MG PO TABS: 4.0000 mg | ORAL_TABLET | Freq: Two times a day (BID) | ORAL | Status: DC | PRN

## 2014-12-25 MED ORDER — AMITRIPTYLINE HCL 25 MG PO TABS
100.0000 mg | ORAL_TABLET | Freq: Every day | ORAL | Status: DC
  Administered 2014-12-25 – 2014-12-26 (×2): 100 mg via ORAL
  Filled 2014-12-25 (×2): qty 4

## 2014-12-25 MED ORDER — BUTALBITAL-APAP-CAFFEINE 50-325-40 MG PO TABS: 1.0000 | ORAL_TABLET | Freq: Two times a day (BID) | ORAL | Status: DC | PRN

## 2014-12-25 MED ORDER — IRBESARTAN 150 MG PO TABS
150.0000 mg | ORAL_TABLET | Freq: Every day | ORAL | Status: DC
  Administered 2014-12-25 – 2014-12-27 (×3): 150 mg via ORAL
  Filled 2014-12-25 (×3): qty 1

## 2014-12-25 MED ORDER — METFORMIN HCL 500 MG PO TABS
500.0000 mg | ORAL_TABLET | Freq: Two times a day (BID) | ORAL | Status: DC
  Administered 2014-12-25 – 2014-12-26 (×2): 500 mg via ORAL
  Filled 2014-12-25 (×2): qty 1

## 2014-12-25 MED ORDER — HYDROCODONE-ACETAMINOPHEN 5-325 MG PO TABS
1.0000 | ORAL_TABLET | Freq: Four times a day (QID) | ORAL | Status: DC | PRN
  Administered 2014-12-25 – 2014-12-27 (×6): 1 via ORAL
  Filled 2014-12-25 (×7): qty 1

## 2014-12-25 MED ORDER — GABAPENTIN 300 MG PO CAPS
900.0000 mg | ORAL_CAPSULE | Freq: Two times a day (BID) | ORAL | Status: DC
  Administered 2014-12-25 – 2014-12-27 (×4): 900 mg via ORAL
  Filled 2014-12-25 (×3): qty 3

## 2014-12-25 MED ORDER — ONDANSETRON HCL 4 MG/2ML IJ SOLN: 4.0000 mg | Freq: Four times a day (QID) | INTRAMUSCULAR | Status: DC | PRN

## 2014-12-25 NOTE — ED Provider Notes (Signed)
CSN: [id number]     Arrival date & time 12/25/14  1051 History  This chart was scribe for Elnora Morrison, MD by Judithann Sauger, ED Scribe. The patient was seen in room APA01/APA01 and the patient's care was started at 12:51 PM.    Chief Complaint  Patient presents with  . Head Injury   The history is provided by the patient. No language interpreter was used.   HPI Comments: Brian [name] is a 59 y.o. male with a hx of DM, HTN, HLD, and HA who presents to the Emergency Department status post head injury that occurred when he was stretching his arm over his head and he blacked out, hitting the back of his head on a wood heater. He denies any LOC but reports that he does not remember the fall. He states that he took his migraine medication before fall. He reports a past similar hx. He reports associated pain to the back of his hips. He denies any neck pain, cough, SOB.  He states that he is currently taking blood thinners. He denies having a blood clot, a hx of heart problems, or any recent surgeries. He denies eating any breakfast.   Past Medical History  Diagnosis Date  . DM (diabetes mellitus)   . HTN (hypertension)   . IBS (irritable bowel syndrome)   . GERD (gastroesophageal reflux disease)   . High cholesterol   . Chronic abdominal pain 08/11/11    Reported per patient, Dr. Fabio Neighbors in [place]  . Adenomatous colon polyp        . Headache(784.0)     migraines  . Gastroparesis   . Chronic diarrhea   . Diverticulosis   . H. pylori infection 2003    pt lived in [place], treated with prevpac.  . H/O Clostridium difficile infection 12/2012  . Anxiety   . Depression   . Shortness of breath   . Sleep apnea     not using cpap  . Seizures     1 seizure with severe back pain. no further meds or seizures  . HOH (hard of hearing)    Past Surgical History  Procedure Laterality Date  . Tonsillectomy    . Arm surgery      tendon/left  . Esophagogastroduodenoscopy  09/02/10    [facility], Dr. 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 pylori  . Esophagogastroduodenoscopy  02/03/2006    Dr. Ileene Rubens White-> hiatal hernia, atrial erosions  . Cholecystectomy  11/20/2011    Procedure: LAPAROSCOPIC CHOLECYSTECTOMY;  Surgeon: Donato Heinz, MD;  Location: [facility];  Service: General;  Laterality: N/A;  . Liver biopsy  11/20/2011    benign  . Esophagogastroduodenoscopy  01/21/2012    [id number] lesion at arytenoid cartilage on the right-likely explains some of his oro- pharyngeal symptoms/Hiatal hernia/Schatzki's ring s/p dilation, gastric erosions without H.pylori  . Larynx surgery      cyst removed, [facility] [place]  . Vena cava filter placement    . Colonoscopy  2012    Dr. Rhunette Croft, [place].pt gives history of adenomatous polyps and says he is due for repeat colonoscopy in 3 years.   . Colonoscopy with propofol N/A 04/13/2013    IHK:[id number] diverticulosis. Single colonic polyp, hyperplastic. Surveillance 2019.   Marland Kitchen Esophagogastroduodenoscopy (egd) with propofol N/A 01/25/2014    Dr. Gala Romney- abnormal distal  esophagus s/p passage of maloney dilator, hiatal hernia, stomach bx= mild chronic inflammation, esophagus bx= benign squamous mucosa  . Esophageal dilation N/A 01/25/2014    Procedure: ESOPHAGEAL DILATION;  Surgeon: Daneil Dolin, MD;  Location: [facility];  Service: Endoscopy;  Laterality: N/A;  Malony 56 french, no heme noted after dilation  . Lens placement in eye     Family History  Problem Relation Age of Onset  . Cancer Father 53    Gallbladder  . Anesthesia problems Neg Hx   . Hypotension Neg Hx   . Malignant hyperthermia Neg Hx   . Pseudochol deficiency Neg Hx   . Diabetes Mother   . GER disease Mother    History  Substance Use Topics  . Smoking status: Current Every  Day Smoker -- 1.00 packs/day for 42 years    Types: Cigarettes  . Smokeless tobacco: Not on file     Comment: one pack a day  . Alcohol Use: Yes     Comment: occasionally    Review of Systems  Constitutional: Negative for fever.  Respiratory: Negative for cough and shortness of breath.   Cardiovascular: Negative for chest pain and leg swelling.  Musculoskeletal: Positive for myalgias and arthralgias. Negative for neck pain.  Neurological: Positive for syncope and headaches. Negative for numbness.  All other systems reviewed and are negative.     Allergies  Ketorolac tromethamine; Sulfamethoxazole; and Tetracyclines & related  Home Medications   Prior to Admission medications   Medication Sig Start Date End Date Taking? Authorizing Provider  ACETAMINOPHEN-BUTALBITAL (BUPAP) 50-650 MG TABS Take 1 tablet by mouth 2 (two) times daily as needed (headaches).    Yes Historical Provider, MD  amitriptyline (ELAVIL) 100 MG tablet Take 100 mg by mouth at bedtime.   Yes Historical Provider, MD  amLODipine-valsartan (EXFORGE) 5-160 MG per tablet Take 1 tablet by mouth daily.    Yes Historical Provider, MD  aspirin 81 MG tablet Take 81 mg by mouth daily.   Yes Historical Provider, MD  carboxymethylcellulose (REFRESH PLUS) 0.5 % SOLN Place 2 drops into the right eye daily as needed (irritation).   Yes Historical Provider, MD  Cholecalciferol (VITAMIN D3) 50000 UNITS CAPS Take 1 capsule by mouth once a week. Takes on Sunday 11/13/14  Yes Historical Provider, MD  diazepam (VALIUM) 5 MG tablet Take 1 tablet by mouth every 6 (six) hours as needed.  03/13/14  Yes Historical Provider, MD  dicyclomine (BENTYL) 20 MG tablet Take 1 tablet (20 mg total) by mouth 2 (two) times daily before a meal. 09/06/14  Yes Mahala Menghini, PA-C  diphenhydrAMINE (BENADRYL) 25 MG tablet Take 25 mg by mouth 2 (two) times daily.   Yes Historical Provider, MD  DULoxetine (CYMBALTA) 60 MG capsule Take 60 mg by mouth daily.    Yes  Historical Provider, MD  gabapentin (NEURONTIN) 300 MG capsule Take 900 mg by mouth 2 (two) times daily.   Yes Historical Provider, MD  HYDROcodone-acetaminophen (NORCO/VICODIN) 5-325 MG per tablet Take 1 tablet by mouth every 6 (six) hours as needed for moderate pain.   Yes Historical Provider, MD  metFORMIN (GLUCOPHAGE) 500 MG tablet Take 500 mg by mouth 2 (two) times daily with a meal. Restart on 6/2 01/29/13  Yes Kathie Dike, MD  metoCLOPramide (REGLAN) 10 MG tablet Take 10 mg by mouth 2 (two) times daily.   Yes Historical Provider, MD  MILK THISTLE PO Take 1,000 mg by mouth every evening.    Yes Historical  Provider, MD  Multiple Vitamin (MULTIVITAMIN) capsule Take 1 capsule by mouth daily.   Yes Historical Provider, MD  ondansetron (ZOFRAN) 4 MG tablet Take 1 tablet (4 mg total) by mouth 2 (two) times daily as needed for nausea or vomiting. 09/06/14  Yes Mahala Menghini, PA-C  pantoprazole (PROTONIX) 40 MG tablet Take 1 tablet (40 mg total) by mouth 2 (two) times daily. 09/06/14  Yes Mahala Menghini, PA-C  Probiotic Product (PROBIOTIC DAILY PO) Take 1 tablet by mouth daily.    Yes Historical Provider, MD  simvastatin (ZOCOR) 80 MG tablet Take 80 mg by mouth every evening.  12/28/13  Yes Historical Provider, MD  sucralfate (CARAFATE) 1 G tablet Take 1 g by mouth 4 (four) times daily.   Yes Historical Provider, MD  topiramate (TOPAMAX) 100 MG tablet Take 100 mg by mouth 2 (two) times daily.   Yes Historical Provider, MD  vitamin B-12 (CYANOCOBALAMIN) 1000 MCG tablet Take 1,000 mcg by mouth daily.    Yes Historical Provider, MD  vitamin E 400 UNIT capsule Take 400 Units by mouth daily.    Yes Historical Provider, MD  lubiprostone (AMITIZA) 24 MCG capsule Take 1 capsule (24 mcg total) by mouth 2 (two) times daily with a meal. Patient not taking: Reported on 12/25/2014 09/06/14   Mahala Menghini, PA-C   BP 122/76 mmHg  Pulse 64  Temp(Src) 97.9 F (36.6 C) (Oral)  Resp 15  Ht 6' (1.829 m)  Wt 232 lb  (105.235 kg)  BMI 31.46 kg/m2  SpO2 95% Physical Exam  Constitutional: He is oriented to person, place, and time. He appears well-developed and well-nourished. No distress.  Pt overall well appearing.   HENT:  Head: Normocephalic.  5 cm laceration posterior scalp, mild gaping  Eyes: Conjunctivae and EOM are normal. Pupils are equal, round, and reactive to light.  Neck: Neck supple. No tracheal deviation present.  Cardiovascular: Normal rate and intact distal pulses.   No murmur heard. Pulmonary/Chest: Effort normal. No respiratory distress.  Lungs clear  Abdominal:  No peritonitis  Musculoskeletal: Normal range of motion.  Extraocular muscle functionally intact Cranial nerves grossly intact Finger to nose intact Grossly equal strength, no drift anywhere  Equal pulses in all extremities.  No cervical, lumbar, or thoracic tenderness  Neurological: He is alert and oriented to person, place, and time.  Skin: Skin is warm and dry.  Psychiatric: He has a normal mood and affect. His behavior is normal.  Nursing note and vitals reviewed.   ED Course  Procedures (including critical care time) LACERATION REPAIR Performed by: Mariea Clonts Authorized by: Mariea Clonts Consent: Verbal consent obtained. Risks and benefits: risks, benefits and alternatives were discussed Consent given by: patient Patient identity confirmed: provided demographic data Prepped and Draped in normal sterile fashion Wound explored  Laceration Location: scalp  Laceration Length: 5cm  No Foreign Bodies seen or palpated    Skin closure: staples  Number 4  Technique:interupted  Patient tolerance: Patient tolerated the procedure well with no immediate complications.   DIAGNOSTIC STUDIES: Oxygen Saturation is 96% on RA, adequate by my interpretation.    COORDINATION OF CARE: 1:00 PM- Pt advised of plan for treatment and pt agrees.    Labs Review Labs Reviewed  CBC WITH  DIFFERENTIAL/PLATELET - Abnormal; Notable for the following:    RBC 4.08 (*)    Eosinophils Relative 17 (*)    Eosinophils Absolute 1.4 (*)    All other components within normal limits  COMPREHENSIVE METABOLIC PANEL - Abnormal; Notable for the following:    Glucose, Bld 112 (*)    GFR calc non Af Amer 61 (*)    GFR calc Af Amer 71 (*)    All other components within normal limits  TROPONIN I  POCT CBG (FASTING - GLUCOSE)-MANUAL ENTRY  CBG MONITORING, ED    Imaging Review Dg Pelvis 1-2 Views  12/25/2014   CLINICAL DATA:  Syncope with fall.  Initial encounter.  EXAM: PELVIS - 1-2 VIEW  COMPARISON:  Body CT 03/30/2014  FINDINGS: There is no evidence of pelvic fracture or diastasis. No significant degenerative change.  IMPRESSION: Negative.   Electronically Signed   By: Monte Fantasia M.D.   On: 12/25/2014 13:47   Ct Head Wo Contrast  12/25/2014   CLINICAL DATA:  59 year old diabetic hypertensive male awoke with migraine headache. Took medicine. Passed out. Hit posterior head on stove.  EXAM: CT HEAD WITHOUT CONTRAST  TECHNIQUE: Contiguous axial images were obtained from the base of the skull through the vertex without intravenous contrast.  COMPARISON:  03/30/2014.  FINDINGS: Soft tissue injury posterior scalp without underlying fracture or intracranial hemorrhage.  No CT evidence of large acute infarct.  No hydrocephalus.  No intracranial mass lesion noted on this unenhanced exam.  Orbital structures unremarkable, post right lens replacement.  Mastoid air cells, middle ear cavities and visualized paranasal sinuses are clear.  IMPRESSION: No skull fracture or intracranial hemorrhage.  No CT evidence of large acute infarct   Electronically Signed   By: Genia Del M.D.   On: 12/25/2014 13:23     EKG Interpretation   Date/Time:  Tuesday December 25 2014 11:12:44 EDT Ventricular Rate:  64 PR Interval:  166 QRS Duration: 94 QT Interval:  414 QTC Calculation: 427 R Axis:   83 Text  Interpretation:  Normal sinus rhythm Normal ECG Confirmed by ZAVITZ   MD, JOSHUA ([id number]) on 12/25/2014 12:17:17 PM      MDM   Final diagnoses:  Acute head injury  Scalp laceration, initial encounter  Syncope and collapse   Sudden syncope, no sxs prior.  Smoker. Pt observed in the ER, mild improvement. Discussed observation in hospital for further eval vs close outpt fup in clinic, pt prefers observation. No known seizure hx, no heavy alcohol use.   The patients results and plan were reviewed and discussed.   Any x-rays performed were personally reviewed by myself.   Differential diagnosis were considered with the presenting HPI.  Medications  sodium chloride 0.9 % bolus 1,000 mL (1,000 mLs Intravenous New Bag/Given 12/25/14 1301)    Filed Vitals:   12/25/14 1252 12/25/14 1253 12/25/14 1330 12/25/14 1400  BP: 144/91  127/78 122/76  Pulse: 64 62 58 64  Temp:      TempSrc:      Resp:  16 12 15   Height:      Weight:      SpO2: 95% 96% 98% 95%    Final diagnoses:  Acute head injury  Scalp laceration, initial encounter  Syncope and collapse    Admission/ observation were discussed with the admitting physician, patient and/or family and they are comfortable with the plan.    Elnora Morrison, MD 12/25/14 [id number]

## 2014-12-25 NOTE — ED Notes (Signed)
Patient tolerating po fluids 

## 2014-12-25 NOTE — H&P (Signed)
[facility] History and Physical  PAXTON BINNS LZJ:[id number] DOB: 01/07/1956 DOA: 12/25/2014  Referring physician: ER PCP: Becky Sax, MD   Chief Complaint: Syncope.  HPI: SOTA Brian [name] is a 59 y.o. male  This is a 59 year old man, diabetic, hypertensive, who had a syncopal episode this morning when he was stretching. He says that he suggest just started to stretch his body and was bending his back in a lordotic position and then he lost consciousness. He thinks he was unconscious for approximately 10 minutes. When he regained consciousness, he was fully alert and orientated. That does not seem to be any postictal symptoms. There were no witnesses to verify what happened. He did not have any chest pain, palpitations or limb weakness. He did not have any visual problems. He is now being admitted for further investigation.   Review of Systems:  Apart from symptoms above, all systems negative.  Past Medical History  Diagnosis Date  . DM (diabetes mellitus)   . HTN (hypertension)   . IBS (irritable bowel syndrome)   . GERD (gastroesophageal reflux disease)   . High cholesterol   . Chronic abdominal pain 08/11/11    Reported per patient, Dr. Fabio Neighbors in [place]  . Adenomatous colon polyp        . Headache(784.0)     migraines  . Gastroparesis   . Chronic diarrhea   . Diverticulosis   . H. pylori infection 2003    pt lived in [place], treated with prevpac.  . H/O Clostridium difficile infection 12/2012  . Anxiety   . Depression   . Shortness of breath   . Sleep apnea     not using cpap  . Seizures     1 seizure with severe back pain. no further meds or seizures  . HOH (hard of hearing)   . Obesity 12/25/2014   Past Surgical History  Procedure Laterality Date  . Tonsillectomy    . Arm surgery      tendon/left  . Esophagogastroduodenoscopy  09/02/10    [facility], Dr. 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 pylori  . Esophagogastroduodenoscopy  02/03/2006    Dr. Ileene Rubens White-> hiatal hernia, atrial erosions  . Cholecystectomy  11/20/2011    Procedure: LAPAROSCOPIC CHOLECYSTECTOMY;  Surgeon: Donato Heinz, MD;  Location: [facility];  Service: General;  Laterality: N/A;  . Liver biopsy  11/20/2011    benign  . Esophagogastroduodenoscopy  01/21/2012    [id number]:[id number] lesion at arytenoid cartilage on the right-likely explains some of his oro- pharyngeal symptoms/Hiatal hernia/Schatzki's ring s/p dilation, gastric erosions without H.pylori  . Larynx surgery      cyst removed, [facility] [place]  . Vena cava filter placement    . Colonoscopy  2012    Dr. Rhunette Croft, [place].pt gives history of adenomatous polyps and says he is due for repeat colonoscopy in 3 years.   . Colonoscopy with propofol N/A 04/13/2013    GDJ:MEQASTM diverticulosis. Single colonic polyp, hyperplastic. Surveillance 2019.   Marland Kitchen Esophagogastroduodenoscopy (egd) with propofol N/A 01/25/2014    Dr. Gala Romney- abnormal distal esophagus s/p passage of maloney dilator, hiatal hernia, stomach bx= mild chronic inflammation, esophagus bx= benign squamous mucosa  . Esophageal dilation N/A 01/25/2014    Procedure: ESOPHAGEAL DILATION;  Surgeon: Daneil Dolin, MD;  Location: [facility];  Service: Endoscopy;  Laterality: N/A;  Malony 56 french, no heme noted after dilation  . Lens placement in eye     Social History:  reports that he has been smoking Cigarettes.  He has a 42 pack-year smoking history. He does not have any smokeless tobacco history on file. He reports that he drinks alcohol. He reports that he does not use illicit drugs.  Allergies  Allergen Reactions  . Ketorolac Tromethamine     Renal failure  . Sulfamethoxazole Rash  . Tetracyclines & Related Rash    Family History  Problem Relation  Age of Onset  . Cancer Father 33    Gallbladder  . Anesthesia problems Neg Hx   . Hypotension Neg Hx   . Malignant hyperthermia Neg Hx   . Pseudochol deficiency Neg Hx   . Diabetes Mother   . GER disease Mother     Prior to Admission medications   Medication Sig Start Date End Date Taking? Authorizing Provider  ACETAMINOPHEN-BUTALBITAL (BUPAP) 50-650 MG TABS Take 1 tablet by mouth 2 (two) times daily as needed (headaches).    Yes Historical Provider, MD  amitriptyline (ELAVIL) 100 MG tablet Take 100 mg by mouth at bedtime.   Yes Historical Provider, MD  amLODipine-valsartan (EXFORGE) 5-160 MG per tablet Take 1 tablet by mouth daily.    Yes Historical Provider, MD  aspirin 81 MG tablet Take 81 mg by mouth daily.   Yes Historical Provider, MD  carboxymethylcellulose (REFRESH PLUS) 0.5 % SOLN Place 2 drops into the right eye daily as needed (irritation).   Yes Historical Provider, MD  Cholecalciferol (VITAMIN D3) 50000 UNITS CAPS Take 1 capsule by mouth once a week. Takes on Sunday 11/13/14  Yes Historical Provider, MD  diazepam (VALIUM) 5 MG tablet Take 1 tablet by mouth every 6 (six) hours as needed.  03/13/14  Yes Historical Provider, MD  dicyclomine (BENTYL) 20 MG tablet Take 1 tablet (20 mg total) by mouth 2 (two) times daily before a meal. 09/06/14  Yes Mahala Menghini, PA-C  diphenhydrAMINE (BENADRYL) 25 MG tablet Take 25 mg by mouth 2 (two) times daily.   Yes Historical Provider, MD  DULoxetine (CYMBALTA) 60 MG capsule Take 60 mg by mouth daily.    Yes Historical Provider, MD  gabapentin (NEURONTIN) 300 MG capsule Take 900 mg by mouth 2 (two) times daily.   Yes Historical Provider, MD  HYDROcodone-acetaminophen (NORCO/VICODIN) 5-325 MG per tablet Take 1 tablet by mouth every 6 (six) hours as needed for moderate pain.   Yes Historical Provider, MD  metFORMIN (GLUCOPHAGE) 500 MG tablet Take 500 mg by mouth 2 (two) times daily with a meal. Restart on 6/2 01/29/13  Yes Kathie Dike, MD    metoCLOPramide (REGLAN) 10 MG tablet Take 10 mg by mouth 2 (two) times daily.   Yes Historical Provider, MD  MILK THISTLE PO Take 1,000 mg by mouth every evening.    Yes Historical Provider, MD  Multiple Vitamin (MULTIVITAMIN) capsule Take 1 capsule by mouth daily.   Yes Historical Provider, MD  ondansetron (ZOFRAN) 4 MG tablet Take 1 tablet (4 mg total) by mouth 2 (two) times daily as needed for nausea or vomiting. 09/06/14  Yes Mahala Menghini, PA-C  pantoprazole (PROTONIX) 40 MG tablet Take 1 tablet (40 mg total) by mouth 2 (two) times daily. 09/06/14  Yes Mahala Menghini, PA-C  Probiotic Product (PROBIOTIC DAILY PO) Take 1 tablet by mouth daily.    Yes Historical Provider, MD  simvastatin (ZOCOR) 80 MG  tablet Take 80 mg by mouth every evening.  12/28/13  Yes Historical Provider, MD  sucralfate (CARAFATE) 1 G tablet Take 1 g by mouth 4 (four) times daily.   Yes Historical Provider, MD  topiramate (TOPAMAX) 100 MG tablet Take 100 mg by mouth 2 (two) times daily.   Yes Historical Provider, MD  vitamin B-12 (CYANOCOBALAMIN) 1000 MCG tablet Take 1,000 mcg by mouth daily.    Yes Historical Provider, MD  vitamin E 400 UNIT capsule Take 400 Units by mouth daily.    Yes Historical Provider, MD  lubiprostone (AMITIZA) 24 MCG capsule Take 1 capsule (24 mcg total) by mouth 2 (two) times daily with a meal. Patient not taking: Reported on 12/25/2014 09/06/14   Mahala Menghini, PA-C   Physical Exam: Filed Vitals:   12/25/14 1253 12/25/14 1330 12/25/14 1400 12/25/14 1519  BP:  127/78 122/76 146/91  Pulse: 62 58 64 64  Temp:      TempSrc:      Resp: 16 12 15 15   Height:      Weight:      SpO2: 96% 98% 95% 99%    Wt Readings from Last 3 Encounters:  12/25/14 105.235 kg (232 lb)  04/25/14 102.331 kg (225 lb 9.6 oz)  03/30/14 102.513 kg (226 lb)    General:  Appears calm and comfortable Eyes: PERRL, normal lids, irises & conjunctiva ENT: grossly normal hearing, lips & tongue Neck: no LAD, masses or  thyromegaly Cardiovascular: RRR, no m/r/g. No LE edema. No carotid bruits. No other bruits in the neck or supraclavicular area. Pulses are equal bilaterally. Telemetry: SR, no arrhythmias  Respiratory: CTA bilaterally, no w/r/r. Normal respiratory effort. Abdomen: soft, ntnd Skin: no rash or induration seen on limited exam Musculoskeletal: grossly normal tone BUE/BLE Psychiatric: grossly normal mood and affect, speech fluent and appropriate Neurologic: grossly non-focal.          Labs on Admission:  Basic Metabolic Panel:  Recent Labs Lab 12/25/14 1132  NA 140  K 3.8  CL 108  CO2 24  GLUCOSE 112*  BUN 14  CREATININE 1.26  CALCIUM 8.8   Liver Function Tests:  Recent Labs Lab 12/25/14 1132  AST 23  ALT 28  ALKPHOS 53  BILITOT 0.6  PROT 6.7  ALBUMIN 4.0   No results for input(s): LIPASE, AMYLASE in the last 168 hours. No results for input(s): AMMONIA in the last 168 hours. CBC:  Recent Labs Lab 12/25/14 1132  WBC 8.0  NEUTROABS 3.6  HGB 13.0  HCT 39.7  MCV 97.3  PLT 193   Cardiac Enzymes:  Recent Labs Lab 12/25/14 1132  TROPONINI <0.03    BNP (last 3 results) No results for input(s): BNP in the last 8760 hours.  ProBNP (last 3 results) No results for input(s): PROBNP in the last 8760 hours.  CBG:  Recent Labs Lab 12/25/14 1130  GLUCAP 96    Radiological Exams on Admission: Dg Pelvis 1-2 Views  12/25/2014   CLINICAL DATA:  Syncope with fall.  Initial encounter.  EXAM: PELVIS - 1-2 VIEW  COMPARISON:  Body CT 03/30/2014  FINDINGS: There is no evidence of pelvic fracture or diastasis. No significant degenerative change.  IMPRESSION: Negative.   Electronically Signed   By: Monte Fantasia M.D.   On: 12/25/2014 13:47   Ct Head Wo Contrast  12/25/2014   CLINICAL DATA:  59 year old diabetic hypertensive male awoke with migraine headache. Took medicine. Passed out. Hit posterior head on stove.  EXAM:  CT HEAD WITHOUT CONTRAST  TECHNIQUE: Contiguous  axial images were obtained from the base of the skull through the vertex without intravenous contrast.  COMPARISON:  03/30/2014.  FINDINGS: Soft tissue injury posterior scalp without underlying fracture or intracranial hemorrhage.  No CT evidence of large acute infarct.  No hydrocephalus.  No intracranial mass lesion noted on this unenhanced exam.  Orbital structures unremarkable, post right lens replacement.  Mastoid air cells, middle ear cavities and visualized paranasal sinuses are clear.  IMPRESSION: No skull fracture or intracranial hemorrhage.  No CT evidence of large acute infarct   Electronically Signed   By: Genia Del M.D.   On: 12/25/2014 13:23    EKG: Independently reviewed. Normal sinus rhythm without any acute ST-T wave changes.  Assessment/Plan   1. Syncope. The etiology is not clear at all. This may simply have been a vasovagal event. We will cycle cardiac enzymes and obtain echocardiogram. Depending on these results, he may need cardiology consultation. 2. Diabetes. Continue with home medications and sliding scale. 3. Obesity.  Further recommendations will depend on patient's hospital progress.   Code Status: Full code.  DVT Prophylaxis: Heparin.  Family Communication: I discussed the plan with the patient at the bedside.  Disposition Plan: Home when medically stable. Possibly tomorrow.   Time spent: 45 minutes.  Doree Albee [facility] Pager [phone].

## 2014-12-25 NOTE — ED Notes (Signed)
Patient states he was lifting his arms over his head this morning and "blacked out" falling backwards. States he hit the back of his head on a cast iron stove. States "the only thing I remember after lifting my arms is picking myself off of the floor." States this has happened once before. Patient has laceration noted to back of head. Bleeding controlled at this time. Patient alert and oriented at this time. Patient states he awake with a migraine this morning and took "some medicine" for it about 0830.

## 2014-12-26 ENCOUNTER — Observation Stay (HOSPITAL_COMMUNITY)
Admit: 2014-12-26 | Discharge: 2014-12-26 | Disposition: A | Discharge status: Home / Self Care | Payer: Medicare HMO | Attending: Internal Medicine | Admitting: Internal Medicine

## 2014-12-26 ENCOUNTER — Observation Stay (HOSPITAL_COMMUNITY): Payer: Medicare HMO

## 2014-12-26 DIAGNOSIS — Z79899 Other long term (current) drug therapy: Secondary | ICD-10-CM | POA: Diagnosis not present

## 2014-12-26 DIAGNOSIS — S0100XD Unspecified open wound of scalp, subsequent encounter: Secondary | ICD-10-CM | POA: Diagnosis not present

## 2014-12-26 DIAGNOSIS — E119 Type 2 diabetes mellitus without complications: Secondary | ICD-10-CM | POA: Diagnosis not present

## 2014-12-26 DIAGNOSIS — R55 Syncope and collapse: Secondary | ICD-10-CM | POA: Diagnosis not present

## 2014-12-26 DIAGNOSIS — M792 Neuralgia and neuritis, unspecified: Secondary | ICD-10-CM | POA: Diagnosis present

## 2014-12-26 DIAGNOSIS — I517 Cardiomegaly: Secondary | ICD-10-CM | POA: Diagnosis not present

## 2014-12-26 DIAGNOSIS — S0101XA Laceration without foreign body of scalp, initial encounter: Secondary | ICD-10-CM | POA: Diagnosis present

## 2014-12-26 DIAGNOSIS — G40909 Epilepsy, unspecified, not intractable, without status epilepticus: Secondary | ICD-10-CM | POA: Diagnosis not present

## 2014-12-26 LAB — CBC
HCT: 40.1 % (ref 39.0–52.0)
Hemoglobin: 13.1 g/dL (ref 13.0–17.0)
MCH: 32 pg (ref 26.0–34.0)
MCHC: 32.7 g/dL (ref 30.0–36.0)
MCV: 98 fL (ref 78.0–100.0)
Platelets: 200 10*3/uL (ref 150–400)
RBC: 4.09 MIL/uL — ABNORMAL LOW (ref 4.22–5.81)
RDW: 12.9 % (ref 11.5–15.5)
WBC: 10.4 10*3/uL (ref 4.0–10.5)

## 2014-12-26 LAB — TROPONIN I: Troponin I: <0.03 ng/mL (ref <0.031)

## 2014-12-26 LAB — COMPREHENSIVE METABOLIC PANEL
ALT: 24 U/L (ref 0–53)
AST: 19 U/L (ref 0–37)
Albumin: 3.7 g/dL (ref 3.5–5.2)
Alkaline Phosphatase: 65 U/L (ref 39–117)
Anion gap: 7 (ref 5–15)
BUN: 13 mg/dL (ref 6–23)
CO2: 27 mmol/L (ref 19–32)
Calcium: 8.8 mg/dL (ref 8.4–10.5)
Chloride: 108 mmol/L (ref 96–112)
Creatinine, Ser: 1.25 mg/dL (ref 0.50–1.35)
GFR calc Af Amer: 72 mL/min — ABNORMAL LOW (ref >90)
GFR calc non Af Amer: 62 mL/min — ABNORMAL LOW (ref >90)
Glucose, Bld: 128 mg/dL — ABNORMAL HIGH (ref 70–99)
Potassium: 3.6 mmol/L (ref 3.5–5.1)
Sodium: 142 mmol/L (ref 135–145)
Total Bilirubin: 0.4 mg/dL (ref 0.3–1.2)
Total Protein: 6.3 g/dL (ref 6.0–8.3)

## 2014-12-26 LAB — GLUCOSE, CAPILLARY
Glucose-Capillary: 69 mg/dL — ABNORMAL LOW (ref 70–99)
Glucose-Capillary: 73 mg/dL (ref 70–99)
Glucose-Capillary: 111 mg/dL — ABNORMAL HIGH (ref 70–99)
Glucose-Capillary: 70 mg/dL (ref 70–99)
Glucose-Capillary: 112 mg/dL — ABNORMAL HIGH (ref 70–99)
Glucose-Capillary: 125 mg/dL — ABNORMAL HIGH (ref 70–99)

## 2014-12-26 LAB — VITAMIN B12: Vitamin B-12: 1686 pg/mL — ABNORMAL HIGH (ref 211–911)

## 2014-12-26 LAB — TSH: TSH: 2.533 u[IU]/mL (ref 0.350–4.500)

## 2014-12-26 MED ORDER — INSULIN ASPART 100 UNIT/ML ~~LOC~~ SOLN: 0.0000 [IU] | Freq: Three times a day (TID) | SUBCUTANEOUS | Status: DC

## 2014-12-26 MED ORDER — METOCLOPRAMIDE HCL 10 MG PO TABS
5.0000 mg | ORAL_TABLET | Freq: Two times a day (BID) | ORAL | Status: DC
  Administered 2014-12-26 – 2014-12-27 (×2): 5 mg via ORAL
  Filled 2014-12-26 (×2): qty 1

## 2014-12-26 MED ORDER — INSULIN ASPART 100 UNIT/ML ~~LOC~~ SOLN: 0.0000 [IU] | Freq: Every day | SUBCUTANEOUS | Status: DC

## 2014-12-26 MED ORDER — MORPHINE SULFATE 2 MG/ML IJ SOLN
2.0000 mg | Freq: Once | INTRAMUSCULAR | Status: AC
  Administered 2014-12-26: 2 mg via INTRAVENOUS
  Filled 2014-12-26: qty 1

## 2014-12-26 MED ORDER — DICYCLOMINE HCL 10 MG PO CAPS
10.0000 mg | ORAL_CAPSULE | Freq: Two times a day (BID) | ORAL | Status: DC
Start: 2014-12-26 — End: 2014-12-27
  Administered 2014-12-26 – 2014-12-27 (×2): 10 mg via ORAL
  Filled 2014-12-26 (×2): qty 1

## 2014-12-26 MED ORDER — POTASSIUM CHLORIDE IN NACL 20-0.9 MEQ/L-% IV SOLN
INTRAVENOUS | Status: DC
  Administered 2014-12-26 – 2014-12-27 (×2): via INTRAVENOUS

## 2014-12-26 NOTE — Progress Notes (Signed)
EEG Completed; Results Pending  

## 2014-12-26 NOTE — Progress Notes (Signed)
UR completed 

## 2014-12-26 NOTE — Progress Notes (Signed)
*  PRELIMINARY RESULTS* Echocardiogram 2D Echocardiogram has been performed.  Leavy Cella 12/26/2014, 3:33 PM
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
## 2014-12-26 NOTE — Progress Notes (Signed)
Patient ambulated hallways with RN supervision, no assistive devices needed.  Tolerated well, patient states that he felt steady on his feet and no feelings of dizziness or SOB.

## 2014-12-27 ENCOUNTER — Encounter (HOSPITAL_COMMUNITY): Payer: Self-pay | Admitting: Internal Medicine

## 2014-12-27 DIAGNOSIS — G40909 Epilepsy, unspecified, not intractable, without status epilepticus: Secondary | ICD-10-CM

## 2014-12-27 DIAGNOSIS — I517 Cardiomegaly: Secondary | ICD-10-CM

## 2014-12-27 DIAGNOSIS — R55 Syncope and collapse: Secondary | ICD-10-CM | POA: Diagnosis not present

## 2014-12-27 DIAGNOSIS — E119 Type 2 diabetes mellitus without complications: Secondary | ICD-10-CM | POA: Diagnosis not present

## 2014-12-27 HISTORY — DX: Cardiomegaly: I51.7

## 2014-12-27 LAB — GLUCOSE, CAPILLARY
Glucose-Capillary: 98 mg/dL (ref 70–99)
Glucose-Capillary: 120 mg/dL — ABNORMAL HIGH (ref 70–99)

## 2014-12-27 LAB — CBC
HCT: 40.7 % (ref 39.0–52.0)
Hemoglobin: 13.7 g/dL (ref 13.0–17.0)
MCH: 32.5 pg (ref 26.0–34.0)
MCHC: 33.7 g/dL (ref 30.0–36.0)
MCV: 96.7 fL (ref 78.0–100.0)
Platelets: 201 10*3/uL (ref 150–400)
RBC: 4.21 MIL/uL — ABNORMAL LOW (ref 4.22–5.81)
RDW: 13.1 % (ref 11.5–15.5)
WBC: 8.1 10*3/uL (ref 4.0–10.5)

## 2014-12-27 LAB — HEMOGLOBIN A1C
Hgb A1c MFr Bld: 5.9 % — ABNORMAL HIGH (ref 4.8–5.6)
Mean Plasma Glucose: 123 mg/dL

## 2014-12-27 MED ORDER — ONDANSETRON HCL 4 MG PO TABS: 4.0000 mg | ORAL_TABLET | Freq: Two times a day (BID) | ORAL | Status: DC | PRN

## 2014-12-27 MED ORDER — TOPIRAMATE 100 MG PO TABS: ORAL_TABLET | ORAL | Status: DC

## 2014-12-27 MED ORDER — PANTOPRAZOLE SODIUM 40 MG PO TBEC: 40.0000 mg | DELAYED_RELEASE_TABLET | Freq: Two times a day (BID) | ORAL | Status: DC

## 2014-12-27 MED ORDER — DICYCLOMINE HCL 20 MG PO TABS: 20.0000 mg | ORAL_TABLET | Freq: Two times a day (BID) | ORAL | Status: DC

## 2014-12-27 MED ORDER — TOPIRAMATE 100 MG PO TABS: 150.0000 mg | ORAL_TABLET | Freq: Every day | ORAL | Status: DC

## 2014-12-27 MED ORDER — METOCLOPRAMIDE HCL 10 MG PO TABS: 5.0000 mg | ORAL_TABLET | Freq: Two times a day (BID) | ORAL | Status: DC

## 2014-12-27 MED ORDER — TOPIRAMATE 100 MG PO TABS: 100.0000 mg | ORAL_TABLET | Freq: Every day | ORAL | Status: DC

## 2014-12-27 MED ORDER — AMITRIPTYLINE HCL 100 MG PO TABS: 50.0000 mg | ORAL_TABLET | Freq: Every day | ORAL | Status: DC
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
## 2014-12-27 NOTE — Progress Notes (Signed)
Discharge instructions reviewed with pt and prescription given. No current questions or concerns. IV and tele removed. Will be leaving shortly

## 2014-12-27 NOTE — Procedures (Signed)
Brian A. Merlene Laughter, Brian [name]     [email or web address]           HISTORY: The patient is a 59 year old man who presents with an episode of loss of consciousness lasting for about 10 minutes and associated with amnesia.  MEDICATIONS: Scheduled Meds: . amitriptyline  100 mg Oral QHS  . amLODipine  5 mg Oral Daily   And  . irbesartan  150 mg Oral Daily  . aspirin EC  81 mg Oral Daily  . atorvastatin  40 mg Oral q1800  . dicyclomine  10 mg Oral BID AC  . diphenhydrAMINE  25 mg Oral BID  . DULoxetine  60 mg Oral Daily  . gabapentin  900 mg Oral BID  . heparin  5,000 Units Subcutaneous 3 times per day  . insulin aspart  0-5 Units Subcutaneous QHS  . insulin aspart  0-9 Units Subcutaneous TID WC  . metoCLOPramide  5 mg Oral BID  . pantoprazole  40 mg Oral BID  . sodium chloride  3 mL Intravenous Q12H  . sucralfate  1 g Oral QID  . topiramate  100 mg Oral BID  . vitamin B-12  1,000 mcg Oral Daily  . [START ON 12/30/2014] Vitamin D (Ergocalciferol)  50,000 Units Oral Weekly  . vitamin E  400 Units Oral Daily   Continuous Infusions: . 0.9 % NaCl with KCl 20 mEq / L 100 mL/hr at 12/27/14 0800   PRN Meds:.HYDROcodone-acetaminophen, ondansetron **OR** ondansetron (ZOFRAN) IV, ondansetron, polyvinyl alcohol  Prior to Admission medications   Medication Sig Start Date End Date Taking? Authorizing Provider  ACETAMINOPHEN-BUTALBITAL (BUPAP) 50-650 MG TABS Take 1 tablet by mouth 2 (two) times daily as needed (headaches).    Yes Historical Provider, Brian [name]  amitriptyline (ELAVIL) 100 MG tablet Take 100 mg by mouth at bedtime.   Yes Historical Provider, Brian [name]  amLODipine-valsartan (EXFORGE) 5-160 MG per tablet Take 1 tablet by mouth daily.    Yes Historical Provider, Brian [name]  aspirin 81 MG tablet Take 81 mg by mouth daily.   Yes Historical Provider, Brian [name]  carboxymethylcellulose (REFRESH PLUS) 0.5 % SOLN Place 2 drops into the right eye daily as needed (irritation).   Yes Historical Provider,  Brian [name]  Cholecalciferol (VITAMIN D3) 50000 UNITS CAPS Take 1 capsule by mouth once a week. Takes on Sunday 11/13/14  Yes Historical Provider, Brian [name]  diazepam (VALIUM) 5 MG tablet Take 1 tablet by mouth every 6 (six) hours as needed.  03/13/14  Yes Historical Provider, Brian [name]  dicyclomine (BENTYL) 20 MG tablet Take 1 tablet (20 mg total) by mouth 2 (two) times daily before a meal. 09/06/14  Yes Mahala Menghini, PA-C  diphenhydrAMINE (BENADRYL) 25 MG tablet Take 25 mg by mouth 2 (two) times daily.   Yes Historical Provider, Brian [name]  DULoxetine (CYMBALTA) 60 MG capsule Take 60 mg by mouth daily.    Yes Historical Provider, Brian [name]  gabapentin (NEURONTIN) 300 MG capsule Take 900 mg by mouth 2 (two) times daily.   Yes Historical Provider, Brian [name]  HYDROcodone-acetaminophen (NORCO/VICODIN) 5-325 MG per tablet Take 1 tablet by mouth every 6 (six) hours as needed for moderate pain.   Yes Historical Provider, Brian [name]  metFORMIN (GLUCOPHAGE) 500 MG tablet Take 500 mg by mouth 2 (two) times daily with a meal. Restart on 6/2 01/29/13  Yes Kathie Dike, Brian [name]  metoCLOPramide (REGLAN) 10 MG tablet Take 10 mg by mouth 2 (two) times daily.   Yes Historical Provider, Brian [name]  MILK THISTLE PO Take 1,000 mg  by mouth every evening.    Yes Historical Provider, Brian [name]  Multiple Vitamin (MULTIVITAMIN) capsule Take 1 capsule by mouth daily.   Yes Historical Provider, Brian [name]  ondansetron (ZOFRAN) 4 MG tablet Take 1 tablet (4 mg total) by mouth 2 (two) times daily as needed for nausea or vomiting. 09/06/14  Yes Mahala Menghini, PA-C  pantoprazole (PROTONIX) 40 MG tablet Take 1 tablet (40 mg total) by mouth 2 (two) times daily. 09/06/14  Yes Mahala Menghini, PA-C  Probiotic Product (PROBIOTIC DAILY PO) Take 1 tablet by mouth daily.    Yes Historical Provider, Brian [name]  simvastatin (ZOCOR) 80 MG tablet Take 80 mg by mouth every evening.  12/28/13  Yes Historical Provider, Brian [name]  sucralfate (CARAFATE) 1 G tablet Take 1 g by mouth 4 (four) times daily.   Yes Historical Provider, Brian [name]  topiramate  (TOPAMAX) 100 MG tablet Take 100 mg by mouth 2 (two) times daily.   Yes Historical Provider, Brian [name]  vitamin B-12 (CYANOCOBALAMIN) 1000 MCG tablet Take 1,000 mcg by mouth daily.    Yes Historical Provider, Brian [name]  vitamin E 400 UNIT capsule Take 400 Units by mouth daily.    Yes Historical Provider, Brian [name]  lubiprostone (AMITIZA) 24 MCG capsule Take 1 capsule (24 mcg total) by mouth 2 (two) times daily with a meal. Patient not taking: Reported on 12/25/2014 09/06/14   Mahala Menghini, PA-C      ANALYSIS: A 16 channel recording using standard 10 20 measurements is conducted for 23 minutes.  There is a well-formed posterior dominant rhythm of 9-9-1/2 Hz which attenuates with eye opening. There is beta activity observed in the frontal areas. Awake and sleep activities are observed. K complexes and sleep spindles are recorded. Photic summation is carried out without abnormal changes in the background activity. Hyperventilation is not carried out. There is no focal or lateral slowing. There several areas of sharp wave activity with phase reverses at T3. No electrographic seizures are recorded however.   IMPRESSION: 1. This recording is abnormal showing left temporal epileptiform discharges. This can be associated with focal onset seizures and secondary generalized seizures.      Brian [name], M.D.  Diplomate, Tax adviser of Psychiatry and Neurology ( Neurology).

## 2014-12-27 NOTE — Consult Note (Signed)
[place] A. Merlene Laughter, MD     [email or web address]          Brian [name] is an 59 y.o. male.   ASSESSMENT/PLAN: 1. Episode of syncope associated with loss of consciousness for 10 minutes, amnesia and abnormal EEG. The spells seems consistent with partial seizure with secondary generalization.   2. Polypharmacy.  3. Chronic pain syndrome.  4. Chronic headaches.  5. Irritable bowel syndrome.  6. Remote history of seizure in the past.  Recommendation: Topamax will be increased from 200 mg a day to 250 mg a day. The dose will be split with 100 mg in morning 150 at nighttime. Seizure precautions discussed with the patient. No driving for now. Agree with trying to reduce some of his medications. Suggestion includes reducing the Benadryl and amitriptyline. Also the Reglan can be reduced or discontinued.  The patient is 59 year old white male who has multiple medical problems. We'll see the patient in the office for chronic pain syndrome. He has been compliant. He is on multiple medications and apparently has been compliant with this. He is on Topamax and gabapentin. I believe these are being used for chronic pain and migraine headaches. He does not report missing doses. The patient reports that he stretches arms out and the apparently fell to the ground and blacked out for about 10 minutes. The patient came to consciousness, he did have some injuries to the head. Patient does not report clear evidence of tonic-clonic activity but he did lose consciousness for 10 minutes. No urinary incontinence is reported. The tip of his tongue is a little sore.The patient tells me that he had a seizure several years ago. This is only seizure that he ever has had. The patient denies chest pain, shortness of breath, GI GU symptoms. He does have chronic issues from headaches and some irritable bowel syndrome symptoms. Also has chronic pain syndrome. The review systems is otherwise negative. He  reports being back to his baseline.  GENERAL: He is in no acute distress.  HEENT: Supple. The tip of his tongue is a little right although it's unclear the etiology.  ABDOMEN: soft  EXTREMITIES: No edema   BACK: Normal.  SKIN: Normal by inspection.    MENTAL STATUS: Alert and oriented. Speech, language and cognition are generally intact. Judgment and insight normal.   CRANIAL NERVES: Pupils are equal, round and reactive to light and accommodation; extra ocular movements are full, there is no significant nystagmus; visual fields are full; upper and lower facial muscles are normal in strength and symmetric, there is no flattening of the nasolabial folds; tongue is midline; uvula is midline; shoulder elevation is normal.  MOTOR: Normal tone, bulk and strength; no pronator drift.  COORDINATION: Left finger to nose is normal, right finger to nose is normal, No rest tremor; no intention tremor; no postural tremor; no bradykinesia.  REFLEXES: Deep tendon reflexes are symmetrical and normal. Babinski reflexes are flexor bilaterally.   SENSATION: Normal to light touch.   EEG shows Several episodes of the left temporal epileptiform discharges.   Blood pressure 115/75, pulse 70, temperature 97.7 F (36.5 C), temperature source Oral, resp. rate 20, height 6' (1.829 m), weight 101 kg (222 lb 10.6 oz), SpO2 93 %.  Past Medical History  Diagnosis Date  . DM (diabetes mellitus)   . HTN (hypertension)   . IBS (irritable bowel syndrome)   . GERD (gastroesophageal reflux disease)   . High cholesterol   . Chronic abdominal pain 08/11/11  Reported per patient, Dr. Fabio Neighbors in [place]  . Adenomatous colon polyp        . Headache(784.0)     migraines  . Gastroparesis   . Chronic diarrhea   . Diverticulosis   . H. pylori infection 2003    pt lived in [place], treated with prevpac.  . H/O Clostridium difficile infection 12/2012  . Anxiety   . Depression   . Shortness of breath   . Sleep  apnea     not using cpap  . Seizures     1 seizure with severe back pain. no further meds or seizures  . HOH (hard of hearing)   . Obesity 12/25/2014    Past Surgical History  Procedure Laterality Date  . Tonsillectomy    . Arm surgery      tendon/left  . Esophagogastroduodenoscopy  09/02/10    [facility], Dr. 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 pylori  . Esophagogastroduodenoscopy  02/03/2006    Dr. Ileene Rubens White-> hiatal hernia, atrial erosions  . Cholecystectomy  11/20/2011    Procedure: LAPAROSCOPIC CHOLECYSTECTOMY;  Surgeon: Donato Heinz, MD;  Location: [facility];  Service: General;  Laterality: N/A;  . Liver biopsy  11/20/2011    benign  . Esophagogastroduodenoscopy  01/21/2012    TMA:UQJFHL lesion at arytenoid cartilage on the right-likely explains some of his oro- pharyngeal symptoms/Hiatal hernia/Schatzki's ring s/p dilation, gastric erosions without H.pylori  . Larynx surgery      cyst removed, [facility] [place]  . Vena cava filter placement    . Colonoscopy  2012    Dr. Rhunette Croft, [place].pt gives history of adenomatous polyps and says he is due for repeat colonoscopy in 3 years.   . Colonoscopy with propofol N/A 04/13/2013    KTG:[id number] diverticulosis. Single colonic polyp, hyperplastic. Surveillance 2019.   Marland Kitchen Esophagogastroduodenoscopy (egd) with propofol N/A 01/25/2014    Dr. Gala Romney- abnormal distal esophagus s/p passage of maloney dilator, hiatal hernia, stomach bx= mild chronic inflammation, esophagus bx= benign squamous mucosa  . Esophageal dilation N/A 01/25/2014    Procedure: ESOPHAGEAL DILATION;  Surgeon: Daneil Dolin, MD;  Location: [facility];  Service: Endoscopy;  Laterality: N/A;  Malony 56 french, no heme noted after dilation  . Lens placement in eye       Family History  Problem Relation Age of Onset  . Cancer Father 33    Gallbladder  . Anesthesia problems Neg Hx   . Hypotension Neg Hx   . Malignant hyperthermia Neg Hx   . Pseudochol deficiency Neg Hx   . Diabetes Mother   . GER disease Mother     Social History:  reports that he has been smoking Cigarettes.  He has a 42 pack-year smoking history. He does not have any smokeless tobacco history on file. He reports that he drinks alcohol. He reports that he does not use illicit drugs.  Allergies:  Allergies  Allergen Reactions  . Ketorolac Tromethamine     Renal failure  . Sulfamethoxazole Rash  . Tetracyclines & Related Rash    Medications: Prior to Admission medications   Medication Sig Start Date End Date Taking? Authorizing Provider  ACETAMINOPHEN-BUTALBITAL (BUPAP) 50-650 MG TABS Take 1 tablet by mouth 2 (two) times daily as needed (headaches).    Yes Historical Provider, MD  amitriptyline (ELAVIL) 100 MG tablet  Take 100 mg by mouth at bedtime.   Yes Historical Provider, MD  amLODipine-valsartan (EXFORGE) 5-160 MG per tablet Take 1 tablet by mouth daily.    Yes Historical Provider, MD  aspirin 81 MG tablet Take 81 mg by mouth daily.   Yes Historical Provider, MD  carboxymethylcellulose (REFRESH PLUS) 0.5 % SOLN Place 2 drops into the right eye daily as needed (irritation).   Yes Historical Provider, MD  Cholecalciferol (VITAMIN D3) 50000 UNITS CAPS Take 1 capsule by mouth once a week. Takes on Sunday 11/13/14  Yes Historical Provider, MD  diazepam (VALIUM) 5 MG tablet Take 1 tablet by mouth every 6 (six) hours as needed.  03/13/14  Yes Historical Provider, MD  dicyclomine (BENTYL) 20 MG tablet Take 1 tablet (20 mg total) by mouth 2 (two) times daily before a meal. 09/06/14  Yes Mahala Menghini, PA-C  diphenhydrAMINE (BENADRYL) 25 MG tablet Take 25 mg by mouth 2 (two) times daily.   Yes Historical Provider, MD  DULoxetine (CYMBALTA) 60 MG capsule Take 60 mg by mouth daily.     Yes Historical Provider, MD  gabapentin (NEURONTIN) 300 MG capsule Take 900 mg by mouth 2 (two) times daily.   Yes Historical Provider, MD  HYDROcodone-acetaminophen (NORCO/VICODIN) 5-325 MG per tablet Take 1 tablet by mouth every 6 (six) hours as needed for moderate pain.   Yes Historical Provider, MD  metFORMIN (GLUCOPHAGE) 500 MG tablet Take 500 mg by mouth 2 (two) times daily with a meal. Restart on 6/2 01/29/13  Yes Kathie Dike, MD  metoCLOPramide (REGLAN) 10 MG tablet Take 10 mg by mouth 2 (two) times daily.   Yes Historical Provider, MD  MILK THISTLE PO Take 1,000 mg by mouth every evening.    Yes Historical Provider, MD  Multiple Vitamin (MULTIVITAMIN) capsule Take 1 capsule by mouth daily.   Yes Historical Provider, MD  ondansetron (ZOFRAN) 4 MG tablet Take 1 tablet (4 mg total) by mouth 2 (two) times daily as needed for nausea or vomiting. 09/06/14  Yes Mahala Menghini, PA-C  pantoprazole (PROTONIX) 40 MG tablet Take 1 tablet (40 mg total) by mouth 2 (two) times daily. 09/06/14  Yes Mahala Menghini, PA-C  Probiotic Product (PROBIOTIC DAILY PO) Take 1 tablet by mouth daily.    Yes Historical Provider, MD  simvastatin (ZOCOR) 80 MG tablet Take 80 mg by mouth every evening.  12/28/13  Yes Historical Provider, MD  sucralfate (CARAFATE) 1 G tablet Take 1 g by mouth 4 (four) times daily.   Yes Historical Provider, MD  topiramate (TOPAMAX) 100 MG tablet Take 100 mg by mouth 2 (two) times daily.   Yes Historical Provider, MD  vitamin B-12 (CYANOCOBALAMIN) 1000 MCG tablet Take 1,000 mcg by mouth daily.    Yes Historical Provider, MD  vitamin E 400 UNIT capsule Take 400 Units by mouth daily.    Yes Historical Provider, MD  lubiprostone (AMITIZA) 24 MCG capsule Take 1 capsule (24 mcg total) by mouth 2 (two) times daily with a meal. Patient not taking: Reported on 12/25/2014 09/06/14   Mahala Menghini, PA-C    Scheduled Meds: . amitriptyline  100 mg Oral QHS  . amLODipine  5 mg Oral Daily   And  .  irbesartan  150 mg Oral Daily  . aspirin EC  81 mg Oral Daily  . atorvastatin  40 mg Oral q1800  . dicyclomine  10 mg Oral BID AC  . diphenhydrAMINE  25 mg Oral BID  .  DULoxetine  60 mg Oral Daily  . gabapentin  900 mg Oral BID  . heparin  5,000 Units Subcutaneous 3 times per day  . insulin aspart  0-5 Units Subcutaneous QHS  . insulin aspart  0-9 Units Subcutaneous TID WC  . metoCLOPramide  5 mg Oral BID  . pantoprazole  40 mg Oral BID  . sodium chloride  3 mL Intravenous Q12H  . sucralfate  1 g Oral QID  . topiramate  100 mg Oral BID  . vitamin B-12  1,000 mcg Oral Daily  . [START ON 12/30/2014] Vitamin D (Ergocalciferol)  50,000 Units Oral Weekly  . vitamin E  400 Units Oral Daily   Continuous Infusions: . 0.9 % NaCl with KCl 20 mEq / L 100 mL/hr at 12/27/14 0800   PRN Meds:.HYDROcodone-acetaminophen, ondansetron **OR** ondansetron (ZOFRAN) IV, ondansetron, polyvinyl alcohol     Results for orders placed or performed during the hospital encounter of 12/25/14 (from the past 48 hour(s))  CBG monitoring, ED     Status: None   Collection Time: 12/25/14 11:30 AM  Result Value Ref Range   Glucose-Capillary 96 70 - 99 mg/dL  CBC WITH DIFFERENTIAL     Status: Abnormal   Collection Time: 12/25/14 11:32 AM  Result Value Ref Range   WBC 8.0 4.0 - 10.5 K/uL   RBC 4.08 (L) 4.22 - 5.81 MIL/uL   Hemoglobin 13.0 13.0 - 17.0 g/dL   HCT 39.7 39.0 - 52.0 %   MCV 97.3 78.0 - 100.0 fL   MCH 31.9 26.0 - 34.0 pg   MCHC 32.7 30.0 - 36.0 g/dL   RDW 13.2 11.5 - 15.5 %   Platelets 193 150 - 400 K/uL   Neutrophils Relative % 45 43 - 77 %   Neutro Abs 3.6 1.7 - 7.7 K/uL   Lymphocytes Relative 29 12 - 46 %   Lymphs Abs 2.3 0.7 - 4.0 K/uL   Monocytes Relative 8 3 - 12 %   Monocytes Absolute 0.7 0.1 - 1.0 K/uL   Eosinophils Relative 17 (H) 0 - 5 %   Eosinophils Absolute 1.4 (H) 0.0 - 0.7 K/uL   Basophils Relative 1 0 - 1 %   Basophils Absolute 0.1 0.0 - 0.1 K/uL  Comprehensive metabolic panel      Status: Abnormal   Collection Time: 12/25/14 11:32 AM  Result Value Ref Range   Sodium 140 135 - 145 mmol/L   Potassium 3.8 3.5 - 5.1 mmol/L   Chloride 108 96 - 112 mmol/L   CO2 24 19 - 32 mmol/L   Glucose, Bld 112 (H) 70 - 99 mg/dL   BUN 14 6 - 23 mg/dL   Creatinine, Ser 1.26 0.50 - 1.35 mg/dL   Calcium 8.8 8.4 - 10.5 mg/dL   Total Protein 6.7 6.0 - 8.3 g/dL   Albumin 4.0 3.5 - 5.2 g/dL   AST 23 0 - 37 U/L   ALT 28 0 - 53 U/L   Alkaline Phosphatase 53 39 - 117 U/L   Total Bilirubin 0.6 0.3 - 1.2 mg/dL   GFR calc non Af Amer 61 (L) >90 mL/min   GFR calc Af Amer 71 (L) >90 mL/min    Comment: (NOTE) The eGFR has been calculated using the CKD EPI equation. This calculation has not been validated in all clinical situations. eGFR's persistently <90 mL/min signify possible Chronic Kidney Disease.    Anion gap 8 5 - 15  Troponin I     Status: None  Collection Time: 12/25/14 11:32 AM  Result Value Ref Range   Troponin I <0.03 <0.031 ng/mL    Comment:        NO INDICATION OF MYOCARDIAL INJURY.   Hemoglobin A1c     Status: Abnormal   Collection Time: 12/25/14 11:32 AM  Result Value Ref Range   Hgb A1c MFr Bld 5.9 (H) 4.8 - 5.6 %    Comment: (NOTE)         Pre-diabetes: 5.7 - 6.4         Diabetes: >6.4         Glycemic control for adults with diabetes: <7.0    Mean Plasma Glucose 123 mg/dL    Comment: (NOTE) Performed At: [facility] [place], [place] [id number] Lindon Romp MD [id number]   Troponin I     Status: None   Collection Time: 12/25/14  4:07 PM  Result Value Ref Range   Troponin I <0.03 <0.031 ng/mL    Comment:        NO INDICATION OF MYOCARDIAL INJURY.   CBG monitoring, ED     Status: None   Collection Time: 12/25/14  4:08 PM  Result Value Ref Range   Glucose-Capillary 75 70 - 99 mg/dL  Troponin I     Status: None   Collection Time: 12/25/14  9:23 PM  Result Value Ref Range   Troponin I <0.03 <0.031 ng/mL    Comment:         NO INDICATION OF MYOCARDIAL INJURY.   Glucose, capillary     Status: Abnormal   Collection Time: 12/26/14 12:11 AM  Result Value Ref Range   Glucose-Capillary 69 (L) 70 - 99 mg/dL   Comment 1 Notify RN    Comment 2 Document in Chart   Glucose, capillary     Status: None   Collection Time: 12/26/14 12:57 AM  Result Value Ref Range   Glucose-Capillary 73 70 - 99 mg/dL   Comment 1 Notify RN    Comment 2 Document in Chart   Troponin I     Status: None   Collection Time: 12/26/14  3:34 AM  Result Value Ref Range   Troponin I <0.03 <0.031 ng/mL    Comment:        NO INDICATION OF MYOCARDIAL INJURY.   Comprehensive metabolic panel     Status: Abnormal   Collection Time: 12/26/14  3:34 AM  Result Value Ref Range   Sodium 142 135 - 145 mmol/L   Potassium 3.6 3.5 - 5.1 mmol/L   Chloride 108 96 - 112 mmol/L   CO2 27 19 - 32 mmol/L   Glucose, Bld 128 (H) 70 - 99 mg/dL   BUN 13 6 - 23 mg/dL   Creatinine, Ser 1.25 0.50 - 1.35 mg/dL   Calcium 8.8 8.4 - 10.5 mg/dL   Total Protein 6.3 6.0 - 8.3 g/dL   Albumin 3.7 3.5 - 5.2 g/dL   AST 19 0 - 37 U/L   ALT 24 0 - 53 U/L   Alkaline Phosphatase 65 39 - 117 U/L   Total Bilirubin 0.4 0.3 - 1.2 mg/dL   GFR calc non Af Amer 62 (L) >90 mL/min   GFR calc Af Amer 72 (L) >90 mL/min    Comment: (NOTE) The eGFR has been calculated using the CKD EPI equation. This calculation has not been validated in all clinical situations. eGFR's persistently <90 mL/min signify possible Chronic Kidney Disease.    Anion gap 7  5 - 15  CBC     Status: Abnormal   Collection Time: 12/26/14  3:34 AM  Result Value Ref Range   WBC 10.4 4.0 - 10.5 K/uL   RBC 4.09 (L) 4.22 - 5.81 MIL/uL   Hemoglobin 13.1 13.0 - 17.0 g/dL   HCT 40.1 39.0 - 52.0 %   MCV 98.0 78.0 - 100.0 fL   MCH 32.0 26.0 - 34.0 pg   MCHC 32.7 30.0 - 36.0 g/dL   RDW 12.9 11.5 - 15.5 %   Platelets 200 150 - 400 K/uL  Glucose, capillary     Status: Abnormal   Collection Time: 12/26/14  7:47  AM  Result Value Ref Range   Glucose-Capillary 111 (H) 70 - 99 mg/dL  Glucose, capillary     Status: None   Collection Time: 12/26/14 12:25 PM  Result Value Ref Range   Glucose-Capillary 70 70 - 99 mg/dL  TSH     Status: None   Collection Time: 12/26/14 12:55 PM  Result Value Ref Range   TSH 2.533 0.350 - 4.500 uIU/mL  Vitamin B12     Status: Abnormal   Collection Time: 12/26/14 12:55 PM  Result Value Ref Range   Vitamin B-12 1686 (H) 211 - 911 pg/mL    Comment: Performed at [facility]  Glucose, capillary     Status: Abnormal   Collection Time: 12/26/14  5:05 PM  Result Value Ref Range   Glucose-Capillary 112 (H) 70 - 99 mg/dL  Glucose, capillary     Status: Abnormal   Collection Time: 12/26/14  9:20 PM  Result Value Ref Range   Glucose-Capillary 125 (H) 70 - 99 mg/dL  CBC     Status: Abnormal   Collection Time: 12/27/14  6:18 AM  Result Value Ref Range   WBC 8.1 4.0 - 10.5 K/uL   RBC 4.21 (L) 4.22 - 5.81 MIL/uL   Hemoglobin 13.7 13.0 - 17.0 g/dL   HCT 40.7 39.0 - 52.0 %   MCV 96.7 78.0 - 100.0 fL   MCH 32.5 26.0 - 34.0 pg   MCHC 33.7 30.0 - 36.0 g/dL   RDW 13.1 11.5 - 15.5 %   Platelets 201 150 - 400 K/uL  Glucose, capillary     Status: None   Collection Time: 12/27/14  7:31 AM  Result Value Ref Range   Glucose-Capillary 98 70 - 99 mg/dL   Comment 1 Notify RN    Comment 2 Document in Chart     Studies/Results:     Kofi A. Merlene Laughter, M.D.  Diplomate, Tax adviser of Psychiatry and Neurology ( Neurology). 12/27/2014, 9:29 AM

## 2015-03-13 ENCOUNTER — Emergency Department (HOSPITAL_COMMUNITY)
Admission: EM | Admit: 2015-03-13 | Discharge: 2015-03-13 | Disposition: A | Discharge status: Home / Self Care | Payer: Medicare HMO | Attending: Emergency Medicine | Admitting: Emergency Medicine

## 2015-03-13 ENCOUNTER — Emergency Department (HOSPITAL_COMMUNITY): Payer: Medicare HMO

## 2015-03-13 ENCOUNTER — Encounter: Payer: Self-pay | Admitting: Nurse Practitioner

## 2015-03-13 ENCOUNTER — Encounter (HOSPITAL_COMMUNITY): Payer: Self-pay | Admitting: Emergency Medicine

## 2015-03-13 ENCOUNTER — Ambulatory Visit (INDEPENDENT_AMBULATORY_CARE_PROVIDER_SITE_OTHER): Payer: Medicare HMO | Admitting: Nurse Practitioner

## 2015-03-13 VITALS — BP 121/73 | HR 86 | Temp 97.8°F | Ht 72.0 in | Wt 241.2 lb

## 2015-03-13 DIAGNOSIS — Z79899 Other long term (current) drug therapy: Secondary | ICD-10-CM | POA: Diagnosis not present

## 2015-03-13 DIAGNOSIS — Z86018 Personal history of other benign neoplasm: Secondary | ICD-10-CM | POA: Insufficient documentation

## 2015-03-13 DIAGNOSIS — G40909 Epilepsy, unspecified, not intractable, without status epilepticus: Secondary | ICD-10-CM | POA: Diagnosis not present

## 2015-03-13 DIAGNOSIS — E669 Obesity, unspecified: Secondary | ICD-10-CM | POA: Insufficient documentation

## 2015-03-13 DIAGNOSIS — F419 Anxiety disorder, unspecified: Secondary | ICD-10-CM | POA: Insufficient documentation

## 2015-03-13 DIAGNOSIS — F329 Major depressive disorder, single episode, unspecified: Secondary | ICD-10-CM | POA: Diagnosis not present

## 2015-03-13 DIAGNOSIS — E119 Type 2 diabetes mellitus without complications: Secondary | ICD-10-CM | POA: Diagnosis not present

## 2015-03-13 DIAGNOSIS — K219 Gastro-esophageal reflux disease without esophagitis: Secondary | ICD-10-CM | POA: Insufficient documentation

## 2015-03-13 DIAGNOSIS — R195 Other fecal abnormalities: Secondary | ICD-10-CM | POA: Insufficient documentation

## 2015-03-13 DIAGNOSIS — H9190 Unspecified hearing loss, unspecified ear: Secondary | ICD-10-CM | POA: Diagnosis not present

## 2015-03-13 DIAGNOSIS — R1032 Left lower quadrant pain: Secondary | ICD-10-CM | POA: Insufficient documentation

## 2015-03-13 DIAGNOSIS — E780 Pure hypercholesterolemia: Secondary | ICD-10-CM | POA: Diagnosis not present

## 2015-03-13 DIAGNOSIS — I10 Essential (primary) hypertension: Secondary | ICD-10-CM | POA: Diagnosis not present

## 2015-03-13 DIAGNOSIS — G8929 Other chronic pain: Secondary | ICD-10-CM | POA: Diagnosis not present

## 2015-03-13 DIAGNOSIS — Z8619 Personal history of other infectious and parasitic diseases: Secondary | ICD-10-CM | POA: Diagnosis not present

## 2015-03-13 DIAGNOSIS — Z720 Tobacco use: Secondary | ICD-10-CM | POA: Diagnosis not present

## 2015-03-13 LAB — COMPREHENSIVE METABOLIC PANEL
ALT: 41 U/L (ref 17–63)
AST: 28 U/L (ref 15–41)
Albumin: 4.2 g/dL (ref 3.5–5.0)
Alkaline Phosphatase: 61 U/L (ref 38–126)
Anion gap: 7 (ref 5–15)
BUN: 15 mg/dL (ref 6–20)
CO2: 25 mmol/L (ref 22–32)
Calcium: 8.6 mg/dL — ABNORMAL LOW (ref 8.9–10.3)
Chloride: 108 mmol/L (ref 101–111)
Creatinine, Ser: 1.5 mg/dL — ABNORMAL HIGH (ref 0.61–1.24)
GFR calc Af Amer: 57 mL/min — ABNORMAL LOW (ref >60)
GFR calc non Af Amer: 49 mL/min — ABNORMAL LOW (ref >60)
Glucose, Bld: 115 mg/dL — ABNORMAL HIGH (ref 65–99)
Potassium: 3.9 mmol/L (ref 3.5–5.1)
Sodium: 140 mmol/L (ref 135–145)
Total Bilirubin: 0.4 mg/dL (ref 0.3–1.2)
Total Protein: 7.1 g/dL (ref 6.5–8.1)

## 2015-03-13 LAB — CBC
HCT: 39.8 % (ref 39.0–52.0)
Hemoglobin: 13.3 g/dL (ref 13.0–17.0)
MCH: 32 pg (ref 26.0–34.0)
MCHC: 33.4 g/dL (ref 30.0–36.0)
MCV: 95.7 fL (ref 78.0–100.0)
Platelets: 198 10*3/uL (ref 150–400)
RBC: 4.16 MIL/uL — ABNORMAL LOW (ref 4.22–5.81)
RDW: 13.1 % (ref 11.5–15.5)
WBC: 9.7 10*3/uL (ref 4.0–10.5)

## 2015-03-13 LAB — URINALYSIS, ROUTINE W REFLEX MICROSCOPIC
Bilirubin Urine: NEGATIVE
Glucose, UA: NEGATIVE mg/dL
Hgb urine dipstick: NEGATIVE
Ketones, ur: NEGATIVE mg/dL
Leukocytes, UA: NEGATIVE
Nitrite: NEGATIVE
Protein, ur: NEGATIVE mg/dL
Specific Gravity, Urine: 1.015 (ref 1.005–1.030)
Urobilinogen, UA: 0.2 mg/dL (ref 0.0–1.0)
pH: 6 (ref 5.0–8.0)

## 2015-03-13 MED ORDER — SODIUM CHLORIDE 0.9 % IV BOLUS (SEPSIS)
1000.0000 mL | Freq: Once | INTRAVENOUS | Status: AC
  Administered 2015-03-13: 1000 mL via INTRAVENOUS

## 2015-03-13 MED ORDER — SODIUM CHLORIDE 0.9 % IJ SOLN
INTRAMUSCULAR | Status: AC
  Filled 2015-03-13: qty 500

## 2015-03-13 MED ORDER — HYDROMORPHONE HCL 1 MG/ML IJ SOLN
1.0000 mg | Freq: Once | INTRAMUSCULAR | Status: AC
  Administered 2015-03-13: 1 mg via INTRAVENOUS
  Filled 2015-03-13: qty 1

## 2015-03-13 MED ORDER — HYDROCODONE-ACETAMINOPHEN 5-325 MG PO TABS: 1.0000 | ORAL_TABLET | Freq: Four times a day (QID) | ORAL | Status: DC | PRN

## 2015-03-13 MED ORDER — IOHEXOL 300 MG/ML  SOLN
50.0000 mL | Freq: Once | INTRAMUSCULAR | Status: AC | PRN
  Administered 2015-03-13: 50 mL via ORAL

## 2015-03-13 MED ORDER — SODIUM CHLORIDE 0.9 % IJ SOLN
INTRAMUSCULAR | Status: AC
  Filled 2015-03-13: qty 60

## 2015-03-13 MED ORDER — DICYCLOMINE HCL 20 MG PO TABS: 20.0000 mg | ORAL_TABLET | Freq: Two times a day (BID) | ORAL | Status: DC

## 2015-03-13 MED ORDER — IOHEXOL 300 MG/ML  SOLN
100.0000 mL | Freq: Once | INTRAMUSCULAR | Status: AC | PRN
  Administered 2015-03-13: 100 mL via INTRAVENOUS

## 2015-03-13 NOTE — Patient Instructions (Signed)
1. Proceed to the emergency room for further evaluation. 2. Further recommendations to be based on the evaluation in the ER.
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
## 2015-03-13 NOTE — Assessment & Plan Note (Signed)
59 year old male with a history of chronic mild abdominal pain likely felt to be IBS versus neuropathic pain. His pain was generally improved with his pain medication on pain management. Today he presents with worsening and severe abdominal pain was 10 out of 10 last night and continues to 5 out of 10. He appears very much in pain and has guarding on abdominal examination. He has a history of diverticulosis on CT. I'm concerned he may have diverticulitis and because his pain has been worsening over the past week or 2 there is a potentially could be moderately severe. Given this and his black stools that he is described to the past several days I'm referring him to the emergency room for further evaluation and workup for acute abdomen

## 2015-03-13 NOTE — Discharge Instructions (Signed)
As discussed, your evaluation today has been largely reassuring.  But, it is important that you monitor your condition carefully, and do not hesitate to return to the ED if you develop new, or concerning changes in your condition. ° °Otherwise, please follow-up with your physician for appropriate ongoing care. ° ° °Abdominal Pain °Many things can cause abdominal pain. Usually, abdominal pain is not caused by a disease and will improve without treatment. It can often be observed and treated at home. Your health care provider will do a physical exam and possibly order blood tests and X-rays to help determine the seriousness of your pain. However, in many cases, more time must pass before a clear cause of the pain can be found. Before that point, your health care provider may not know if you need more testing or further treatment. °HOME CARE INSTRUCTIONS  °Monitor your abdominal pain for any changes. The following actions may help to alleviate any discomfort you are experiencing: °· Only take over-the-counter or prescription medicines as directed by your health care provider. °· Do not take laxatives unless directed to do so by your health care provider. °· Try a clear liquid diet (broth, tea, or water) as directed by your health care provider. Slowly move to a bland diet as tolerated. °SEEK MEDICAL CARE IF: °· You have unexplained abdominal pain. °· You have abdominal pain associated with nausea or diarrhea. °· You have pain when you urinate or have a bowel movement. °· You experience abdominal pain that wakes you in the night. °· You have abdominal pain that is worsened or improved by eating food. °· You have abdominal pain that is worsened with eating fatty foods. °· You have a fever. °SEEK IMMEDIATE MEDICAL CARE IF:  °· Your pain does not go away within 2 hours. °· You keep throwing up (vomiting). °· Your pain is felt only in portions of the abdomen, such as the right side or the left lower portion of the  abdomen. °· You pass bloody or black tarry stools. °MAKE SURE YOU: °· Understand these instructions.   °· Will watch your condition.   °· Will get help right away if you are not doing well or get worse.   °Document Released: 05/27/2005 Document Revised: 08/22/2013 Document Reviewed: 04/26/2013 °[employer]® Patient Information ©2015 [employer]. This information is not intended to replace advice given to you by your health care provider. Make sure you discuss any questions you have with your health care provider. ° °

## 2015-03-13 NOTE — ED Notes (Signed)
Pt reports LLQ pain x2 months. Pt reports intermittent pain ever since diagnosis with diverticulitis. Pt reports intermittent nausea and diarrhea. Pt sent here by GI specialist to rule out abdominal infection.

## 2015-03-13 NOTE — Assessment & Plan Note (Signed)
59 year old patient with acute abdominal pain which also has a milder epigastric component and he is described dark stools over the past several days. In the office today he did have a bowel movement which was normal. Cannot rule out upper GI bleed at this time. However the more concerning aspect of his exam is with his left-sided abdominal pain particularly left lower quadrant. He appears very much in pain, is holding that area for the duration of the visit, and appears to have guarding on abdominal exam. He has history of diverticulosis and we are referring him to the emergency room for further evaluation.

## 2015-03-13 NOTE — Progress Notes (Signed)
Referring Provider: Dorna Mai, MD Primary Care Physician:  [facility] Primary GI:  Dr. Gala Romney  Chief Complaint  Patient presents with  . Abdominal Pain    left side    HPI:   59 year old male presents for follow-up on abdominal pain. Has history of chronic abdominal pain which is been possibly neuropathic with possible component of IBS. Bentyl has helped in the past. He was having constipation and at his previous visit so he was weaned off the Bentyl and added Amitiza. Continues to see pain management clinic. GERD was well controlled at his last visit on PPI therapy. His LFTs were elevated prior to his last visit but thought to possibly be due to trauma from a fall. There rechecked and found normal. Hepatitis C was negative, hepatitis B was added and negative. EGD with dilation performed 01/17/2014 with esophageal and gastric mucosa biopsies normal. Colonoscopy is up-to-date, last performed 04/13/2013 with a single hyperplastic polyp removed, due for repeat exam in 2019.  Today he states he's having severe abdominal pain which has been getting progressively worse for the last couple weeks. Admits subjective fever but did not take his temperature. Pain is "definitely worse" then his chronic abdominal pain, rates pain 10/10 last night "if crying would have helped I would have cried." Currently pain is a 5/10. Pain is worse in the evenings and after eating. Pain is not improved after a bowel movement. Denies hematochezia. Admits darkened stools "almost black" since the past couple days. Denies iron and pepto use. Has a bowel movement about every day and is described as somewhat constipated due to pain medications. Is not taking Amitiza currently, has stopped it a while back "a [place] time ago." Denies chest pain, dyspnea, dizziness, lightheadedness, syncope, near syncope. Denies any other upper or lower GI symptoms. Did have a syncopal episode a couple months ago which no  etiology was found even after hospital admission. He has been eating and drinking although with intense pain.  Past Medical History  Diagnosis Date  . DM (diabetes mellitus)   . HTN (hypertension)   . IBS (irritable bowel syndrome)   . GERD (gastroesophageal reflux disease)   . High cholesterol   . Chronic abdominal pain 08/11/11    Reported per patient, Dr. Fabio Neighbors in [place]  . Adenomatous colon polyp        . Headache(784.0)     migraines  . Gastroparesis   . Chronic diarrhea   . Diverticulosis   . H. pylori infection 2003    pt lived in [place], treated with prevpac.  . H/O Clostridium difficile infection 12/2012  . Anxiety   . Depression   . Shortness of breath   . Sleep apnea     not using cpap  . Seizure disorder     1 seizure with severe back pain. no further meds or seizures  . HOH (hard of hearing)   . Obesity   . LVH (left ventricular hypertrophy)   . Neuropathic pain of left forearm   . Polypharmacy 12/26/2014  . Syncope and collapse 12/25/2014    Thought be secondary to seizure.    Past Surgical History  Procedure Laterality Date  . Tonsillectomy    . Arm surgery      tendon/left  . Esophagogastroduodenoscopy  09/02/10    [facility], Dr. 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 pylori  . Esophagogastroduodenoscopy  02/03/2006    Dr. Ileene Rubens White-> hiatal hernia, atrial erosions  . Cholecystectomy  11/20/2011    Procedure: LAPAROSCOPIC CHOLECYSTECTOMY;  Surgeon: Donato Heinz, MD;  Location: [facility];  Service: General;  Laterality: N/A;  . Liver biopsy  11/20/2011    benign  . Esophagogastroduodenoscopy  01/21/2012    ZJI:[id number] lesion at arytenoid cartilage on the right-likely explains some of his oro- pharyngeal symptoms/Hiatal hernia/Schatzki's ring s/p  dilation, gastric erosions without H.pylori  . Larynx surgery      cyst removed, [facility] [place]  . Vena cava filter placement    . Colonoscopy  2012    Dr. Rhunette Croft, [place].pt gives history of adenomatous polyps and says he is due for repeat colonoscopy in 3 years.   . Colonoscopy with propofol N/A 04/13/2013    YBO:[id number] diverticulosis. Single colonic polyp, hyperplastic. Surveillance 2019.   Marland Kitchen Esophagogastroduodenoscopy (egd) with propofol N/A 01/25/2014    Dr. Gala Romney- abnormal distal esophagus s/p passage of maloney dilator, hiatal hernia, stomach bx= mild chronic inflammation, esophagus bx= benign squamous mucosa  . Esophageal dilation N/A 01/25/2014    Procedure: ESOPHAGEAL DILATION;  Surgeon: Daneil Dolin, MD;  Location: [facility];  Service: Endoscopy;  Laterality: N/A;  Malony 56 french, no heme noted after dilation  . Lens placement in eye      Current Outpatient Prescriptions  Medication Sig Dispense Refill  . amitriptyline (ELAVIL) 100 MG tablet Take 0.5 tablets (50 mg total) by mouth at bedtime.    Marland Kitchen amLODipine-valsartan (EXFORGE) 5-160 MG per tablet Take 1 tablet by mouth daily.     Marland Kitchen aspirin 81 MG tablet Take 81 mg by mouth daily.    . carboxymethylcellulose (REFRESH PLUS) 0.5 % SOLN Place 2 drops into the right eye daily as needed (irritation).    . Cholecalciferol (VITAMIN D3) 50000 UNITS CAPS Take 1 capsule by mouth once a week. Takes on Sunday    . dicyclomine (BENTYL) 20 MG tablet Take 1 tablet (20 mg total) by mouth 2 (two) times daily before a meal.    . diphenhydrAMINE (BENADRYL) 25 MG tablet Take 25 mg by mouth 2 (two) times daily.    . DULoxetine (CYMBALTA) 60 MG capsule Take 60 mg by mouth daily.     Marland Kitchen gabapentin (NEURONTIN) 300 MG capsule Take 900 mg by mouth 2 (two) times daily.    . metFORMIN (GLUCOPHAGE) 500 MG tablet Take 500 mg by mouth 2 (two) times daily with a meal. Restart on 6/2    . metoCLOPramide (REGLAN) 10 MG tablet Take 0.5 tablets (5 mg total)  by mouth 2 (two) times daily. (Patient taking differently: Take 5 mg by mouth 2 (two) times daily. .2.5 in am  2.5 in pm)    . MILK THISTLE PO Take 1,000 mg by mouth every evening.     . Multiple Vitamin (MULTIVITAMIN) capsule Take 1 capsule by mouth daily.    . ondansetron (ZOFRAN) 4 MG tablet Take 1 tablet (4 mg total) by mouth 2 (two) times daily as needed for nausea or vomiting.    . pantoprazole (PROTONIX) 40 MG tablet Take 1 tablet (40 mg total) by mouth 2 (two) times daily.    . Probiotic Product (PROBIOTIC DAILY PO) Take 1 tablet by mouth daily.     . simvastatin (ZOCOR) 80 MG tablet Take 80 mg by mouth every evening.     . sucralfate (CARAFATE)  1 G tablet Take 1 g by mouth 4 (four) times daily.    Marland Kitchen topiramate (TOPAMAX) 100 MG tablet Take 1 tab every morning and 1.5 tabs every evening. 75 tablet 1  . vitamin B-12 (CYANOCOBALAMIN) 1000 MCG tablet Take 1,000 mcg by mouth daily.     . vitamin E 400 UNIT capsule Take 400 Units by mouth daily.     . diazepam (VALIUM) 5 MG tablet Take 1 tablet by mouth every 6 (six) hours as needed.     Marland Kitchen HYDROcodone-acetaminophen (NORCO/VICODIN) 5-325 MG per tablet Take 1 tablet by mouth every 6 (six) hours as needed for moderate pain.     No current facility-administered medications for this visit.   Facility-Administered Medications Ordered in Other Visits  Medication Dose Route Frequency Provider Last Rate Last Dose  . sodium chloride irrigation 0.9 %    PRN Chelsea Primus, MD   1,000 mL at 11/20/11 0815    Allergies as of 03/13/2015 - Review Complete 03/13/2015  Allergen Reaction Noted  . Ketorolac tromethamine  10/27/2011  . Sulfamethoxazole Rash 10/27/2011  . Tetracyclines & related Rash 10/27/2011    Family History  Problem Relation Age of Onset  . Cancer Father 36    Gallbladder  . Anesthesia problems Neg Hx   . Hypotension Neg Hx   . Malignant hyperthermia Neg Hx   . Pseudochol deficiency Neg Hx   . Diabetes Mother   . GER disease  Mother     History   Social History  . Marital Status: Divorced    Spouse Name: N/A  . Number of Children: 1  . Years of Education: N/A   Occupational History  . disabled    Social History Main Topics  . Smoking status: Current Every Day Smoker -- 1.00 packs/day for 42 years    Types: Cigarettes  . Smokeless tobacco: Not on file     Comment: one pack a day  . Alcohol Use: Yes     Comment: occasionally  . Drug Use: No  . Sexual Activity: Not on file   Other Topics Concern  . None   Social History Narrative   Divorced, moved to [facility] November 2012    Review of Systems: General: Negative for anorexia, weight loss. CV: Negative for chest pain, angina, palpitations, dyspnea on exertion, peripheral edema.  Respiratory: Negative for dyspnea at rest, cough, sputum, wheezing.  GI: See history of present illness. Endo: Negative for unusual weight change.  Heme: Negative for bruising or bleeding.   Physical Exam: BP 121/73 mmHg  Pulse 86  Temp(Src) 97.8 F (36.6 C) (Oral)  Ht 6' (1.829 m)  Wt 241 lb 3.2 oz (109.408 kg)  BMI 32.71 kg/m2 General:   Alert and oriented. Pleasant and cooperative. Well-nourished and well-developed. Appears fatigued and in pain. Head:  Normocephalic and atraumatic. Eyes:  Without icterus, sclera clear and conjunctiva pink.  Ears:  Normal auditory acuity. Cardiovascular:  S1, S2 present without murmurs appreciated. Normal pulses noted. Extremities without clubbing or edema. Respiratory:  Clear to auscultation bilaterally. No wheezes, rales, or rhonchi. No distress.  Gastrointestinal:  +BS, soft, and non-distended. Mild to moderate TTP epigastric area, moderate to severe pain with palpation of LUQ and LLQ. No HSM noted. Guarding with abdominal palpation, no rebound noted. No masses appreciated. Patient holding his LLQ during the duration of the visit. Rectal:  Deferred  Neurologic:  Alert and oriented x4;  grossly normal  neurologically. Psych:  Alert and cooperative. Normal mood and affect.  Heme/Lymph/Immune: No excessive bruising noted.    03/13/2015 3:09 PM

## 2015-03-18 NOTE — Progress Notes (Signed)
cc'ed to pcp °

## 2015-04-02 ENCOUNTER — Encounter: Payer: Self-pay | Admitting: Internal Medicine

## 2015-05-14 ENCOUNTER — Ambulatory Visit: Payer: Medicare HMO | Admitting: Nurse Practitioner

## 2015-06-04 ENCOUNTER — Ambulatory Visit (HOSPITAL_COMMUNITY)
Admission: RE | Admit: 2015-06-04 | Discharge: 2015-06-04 | Disposition: A | Discharge status: Home / Self Care | Payer: Medicare HMO | Source: Ambulatory Visit | Attending: Nurse Practitioner | Admitting: Nurse Practitioner

## 2015-06-04 ENCOUNTER — Encounter: Payer: Self-pay | Admitting: Nurse Practitioner

## 2015-06-04 ENCOUNTER — Ambulatory Visit (INDEPENDENT_AMBULATORY_CARE_PROVIDER_SITE_OTHER): Payer: Medicare HMO | Admitting: Nurse Practitioner

## 2015-06-04 VITALS — BP 108/60 | HR 80 | Temp 98.4°F | Ht 72.0 in | Wt 232.0 lb

## 2015-06-04 DIAGNOSIS — K5900 Constipation, unspecified: Secondary | ICD-10-CM | POA: Diagnosis not present

## 2015-06-04 DIAGNOSIS — Z9049 Acquired absence of other specified parts of digestive tract: Secondary | ICD-10-CM | POA: Diagnosis not present

## 2015-06-04 DIAGNOSIS — R1032 Left lower quadrant pain: Secondary | ICD-10-CM

## 2015-06-04 DIAGNOSIS — R109 Unspecified abdominal pain: Secondary | ICD-10-CM

## 2015-06-04 DIAGNOSIS — G8929 Other chronic pain: Secondary | ICD-10-CM

## 2015-06-04 DIAGNOSIS — R197 Diarrhea, unspecified: Secondary | ICD-10-CM | POA: Diagnosis not present

## 2015-06-04 MED ORDER — LINACLOTIDE 145 MCG PO CAPS: 145.0000 ug | ORAL_CAPSULE | Freq: Every day | ORAL | Status: DC

## 2015-06-04 NOTE — Progress Notes (Signed)
Referring Provider: [facility] Primary Care Physician:  [facility] Primary GI:  Dr. Gala Romney  Chief Complaint  Patient presents with  . Follow-up    pain in LLQ    HPI:   59 year old male presents for follow-up on abdominal pain. Last office visit 03/13/2015 with a 2 to three-month history of chronic abdominal pain worsening. Had previously gotten relief from Bentyl and Amitiza. Sees pain management. EGD with dilation performed 01/17/2014 with esophageal and gastric mucosa biopsies normal. Colonoscopy is up-to-date, last performed 04/13/2013 with a single hyperplastic polyp removed, due for repeat exam in 2019. At last visit his abdominal pain was described as progressively worse over the previous couple weeks and was 10 out of 10 the night before. Was also complaining of dark and stool. At that time he was not taking Amitiza. He was referred to the emergency room for further follow-up due to history of diverticulosis on CT and possible diverticulitis at that time. ER workup was unremarkable with CT of the abdomen and pelvis showing no acute process and labs essentially normal.  Today he states he continues with lower abdominal pain. States he was woken 2:30 am a couple days ago with sharp pain. Pain is constant and dull, sharp pains are intermittent. Pain medication helps 'take the edge off" but does not relive pain. Pain management has changed meds around, which has helped some. No correlation to eating. Nausea every morning, no vomiting. Diarrhea x 3 days last week Wednesday through Saturday. Has not had bowel movement since. Typically has a bowel movement every 3 days or so. Sensation of incomplete emptying and hard stools. Pain is occasionally improved with bowel movement. Amitiza was ineffective. Denies hematochezia and melena. Denies chest pain, dyspnea, dizziness, lightheadedness, syncope, near syncope. Denies any other upper or lower GI  symptoms.  Past Medical History  Diagnosis Date  . DM (diabetes mellitus) ([place])   . HTN (hypertension)   . IBS (irritable bowel syndrome)   . GERD (gastroesophageal reflux disease)   . High cholesterol   . Chronic abdominal pain 08/11/11    Reported per patient, Dr. Fabio Neighbors in [place]  . Adenomatous colon polyp        . Headache(784.0)     migraines  . Gastroparesis   . Chronic diarrhea   . Diverticulosis   . H. pylori infection 2003    pt lived in [place], treated with prevpac.  . H/O Clostridium difficile infection 12/2012  . Anxiety   . Depression   . Shortness of breath   . Sleep apnea     not using cpap  . Seizure disorder ([place])     1 seizure with severe back pain. no further meds or seizures  . HOH (hard of hearing)   . Obesity   . LVH (left ventricular hypertrophy)   . Neuropathic pain of left forearm   . Polypharmacy 12/26/2014  . Syncope and collapse 12/25/2014    Thought be secondary to seizure.    Past Surgical History  Procedure Laterality Date  . Tonsillectomy    . Arm surgery      tendon/left  . Esophagogastroduodenoscopy  09/02/10    [facility], Dr. 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 pylori  . Esophagogastroduodenoscopy  02/03/2006    Dr. Ileene Rubens White-> hiatal hernia, atrial  erosions  . Cholecystectomy  11/20/2011    Procedure: LAPAROSCOPIC CHOLECYSTECTOMY;  Surgeon: Donato Heinz, MD;  Location: [facility];  Service: General;  Laterality: N/A;  . Liver biopsy  11/20/2011    benign  . Esophagogastroduodenoscopy  01/21/2012    [facility]:CBJSEG lesion at arytenoid cartilage on the right-likely explains some of his oro- pharyngeal symptoms/Hiatal hernia/Schatzki's ring s/p dilation, gastric erosions without H.pylori  . Larynx surgery      cyst removed, ENT [place]   . Vena cava filter placement    . Colonoscopy  2012    Dr. Rhunette Croft, [place]pt gives history of adenomatous polyps and says he is due for repeat colonoscopy in 3 years.   . Colonoscopy with propofol N/A 04/13/2013    [id number]:[id number] diverticulosis. Single colonic polyp, hyperplastic. Surveillance 2019.   Marland Kitchen Esophagogastroduodenoscopy (egd) with propofol N/A 01/25/2014    Dr. Gala Romney- abnormal distal esophagus s/p passage of maloney dilator, hiatal hernia, stomach bx= mild chronic inflammation, esophagus bx= benign squamous mucosa  . Esophageal dilation N/A 01/25/2014    Procedure: ESOPHAGEAL DILATION;  Surgeon: Daneil Dolin, MD;  Location: [facility];  Service: Endoscopy;  Laterality: N/A;  Malony 56 french, no heme noted after dilation  . Lens placement in eye      Current Outpatient Prescriptions  Medication Sig Dispense Refill  . amitriptyline (ELAVIL) 100 MG tablet Take 0.5 tablets (50 mg total) by mouth at bedtime. (Patient taking differently: Take 100 mg by mouth at bedtime. )    . amLODipine-valsartan (EXFORGE) 5-160 MG per tablet Take 1 tablet by mouth daily.     Marland Kitchen aspirin 81 MG tablet Take 81 mg by mouth daily.    . carboxymethylcellulose (REFRESH PLUS) 0.5 % SOLN Place 2 drops into the right eye daily as needed (irritation).    . Cholecalciferol (VITAMIN D3) 50000 UNITS CAPS Take 1 capsule by mouth once a week. Takes on Sunday    . diazepam (VALIUM) 5 MG tablet Take 1 tablet by mouth every 6 (six) hours as needed.     . dicyclomine (BENTYL) 20 MG tablet Take 1 tablet (20 mg total) by mouth 2 (two) times daily. (Patient taking differently: Take 20 mg by mouth 4 (four) times daily as needed. ) 20 tablet 0  . diphenhydrAMINE (BENADRYL) 25 MG tablet Take 25 mg by mouth 2 (two) times daily.    . DULoxetine (CYMBALTA) 60 MG capsule Take 60 mg by mouth daily.     Marland Kitchen gabapentin (NEURONTIN) 300 MG capsule Take 900 mg by mouth 2 (two) times daily.    . metFORMIN (GLUCOPHAGE) 500 MG tablet  Take 500 mg by mouth 2 (two) times daily with a meal.     . metoCLOPramide (REGLAN) 10 MG tablet Take 0.5 tablets (5 mg total) by mouth 2 (two) times daily.    Marland Kitchen MILK THISTLE PO Take 1,000 mg by mouth every evening.     . Multiple Vitamin (MULTIVITAMIN) capsule Take 1 capsule by mouth daily.    . Omega 3 1200 MG CAPS Take 1,200 mg by mouth daily.    . ondansetron (ZOFRAN) 4 MG tablet Take 1 tablet (4 mg total) by mouth 2 (two) times daily as needed for nausea or vomiting.    Marland Kitchen oxyCODONE-acetaminophen (PERCOCET/ROXICET) 5-325 MG tablet   0  . pantoprazole (PROTONIX) 40 MG tablet Take 1 tablet (40 mg total) by mouth 2 (two) times daily.    . Probiotic Product (PROBIOTIC DAILY PO) Take 1 tablet by mouth  daily.     . simvastatin (ZOCOR) 80 MG tablet Take 80 mg by mouth every evening.     . sucralfate (CARAFATE) 1 G tablet Take 1 g by mouth 4 (four) times daily. 1.5 pill at bed time    . topiramate (TOPAMAX) 100 MG tablet Take 1 tab every morning and 1.5 tabs every evening. (Patient taking differently: Take 100-150 mg by mouth 2 (two) times daily. ) 75 tablet 1  . vitamin B-12 (CYANOCOBALAMIN) 1000 MCG tablet Take 1,000 mcg by mouth daily.     . vitamin E 400 UNIT capsule Take 400 Units by mouth daily.     Marland Kitchen HYDROcodone-acetaminophen (NORCO/VICODIN) 5-325 MG per tablet Take 1 tablet by mouth every 6 (six) hours as needed for moderate pain.    Marland Kitchen HYDROcodone-acetaminophen (NORCO/VICODIN) 5-325 MG per tablet Take 1 tablet by mouth every 6 (six) hours as needed for severe pain. (Patient not taking: Reported on 06/04/2015) 15 tablet 0   No current facility-administered medications for this visit.   Facility-Administered Medications Ordered in Other Visits  Medication Dose Route Frequency Provider Last Rate Last Dose  . sodium chloride irrigation 0.9 %    PRN Chelsea Primus, MD   1,000 mL at 11/20/11 0815    Allergies as of 06/04/2015 - Review Complete 06/04/2015  Allergen Reaction Noted  . Ketorolac  tromethamine  10/27/2011  . Sulfamethoxazole Rash 10/27/2011  . Tetracyclines & related Rash 10/27/2011    Family History  Problem Relation Age of Onset  . Cancer Father 33    Gallbladder  . Anesthesia problems Neg Hx   . Hypotension Neg Hx   . Malignant hyperthermia Neg Hx   . Pseudochol deficiency Neg Hx   . Diabetes Mother   . GER disease Mother     Social History   Social History  . Marital Status: Divorced    Spouse Name: N/A  . Number of Children: 1  . Years of Education: N/A   Occupational History  . disabled    Social History Main Topics  . Smoking status: Current Every Day Smoker -- 1.00 packs/day for 42 years    Types: Cigarettes  . Smokeless tobacco: None     Comment: one pack a day  . Alcohol Use: Yes     Comment: occasionally  . Drug Use: No  . Sexual Activity: Not Asked   Other Topics Concern  . None   Social History Narrative   Divorced, moved to [facility] November 2012    Review of Systems: General: Negative for anorexia, weight loss, fever, chills, fatigue, weakness. Eyes: Negative for vision changes.  ENT: Negative for hoarseness, difficulty swallowing , nasal congestion. CV: Negative for chest pain, angina, palpitations, dyspnea on exertion, peripheral edema.  Respiratory: Negative for dyspnea at rest, dyspnea on exertion, cough, sputum, wheezing.  GI: See history of present illness. GU:  Negative for dysuria, hematuria, urinary incontinence, urinary frequency, nocturnal urination.  MS: Negative for joint pain, low back pain.  Derm: Negative for rash or itching.  Neuro: Negative for weakness, abnormal sensation, seizure, frequent headaches, memory loss, confusion.  Psych: Negative for anxiety, depression, suicidal ideation, hallucinations.  Endo: Negative for unusual weight change.  Heme: Negative for bruising or bleeding. Allergy: Negative for rash or hives.   Physical Exam: BP 108/60 mmHg  Pulse 80  Temp(Src) 98.4 F (36.9 C)  (Oral)  Ht 6' (1.829 m)  Wt 232 lb (105.235 kg)  BMI 31.46 kg/m2 General:   Alert and oriented. Pleasant  and cooperative. Well-nourished and well-developed.  Head:  Normocephalic and atraumatic. Eyes:  Without icterus, sclera clear and conjunctiva pink.  Ears:  Normal auditory acuity. Mouth:  No deformity or lesions, oral mucosa pink.  Throat/Neck:  Supple, without mass or thyromegaly. Cardiovascular:  S1, S2 present without murmurs appreciated. Normal pulses noted. Extremities without clubbing or edema. Respiratory:  Clear to auscultation bilaterally. No wheezes, rales, or rhonchi. No distress.  Gastrointestinal:  +BS, soft, non-tender and non-distended. No HSM noted. No guarding or rebound. No masses appreciated.  Rectal:  Deferred  Musculoskalatal:  Symmetrical without gross deformities. Normal posture. Skin:  Intact without significant lesions or rashes. Neurologic:  Alert and oriented x4;  grossly normal neurologically. Psych:  Alert and cooperative. Normal mood and affect. Heme/Lymph/Immune: No significant cervical adenopathy. No excessive bruising noted.    06/04/2015 9:45 AM

## 2015-06-04 NOTE — Progress Notes (Signed)
CC'D TO PCP °

## 2015-06-04 NOTE — Patient Instructions (Signed)
1. After x-ray done when you're able to. 2. Stop taking Bentyl. 3. Start taking Linzess 145 g once a day. We will Prodigy samples last a couple few weeks. 4. Call us and let us know after 2 weeks, coughing or having a bowel movement and a pain is any improved. 5. Return for follow-up in 3 months or sooner if symptoms worsen or do not improve.

## 2015-06-05 DIAGNOSIS — K5900 Constipation, unspecified: Secondary | ICD-10-CM | POA: Insufficient documentation

## 2015-06-05 DIAGNOSIS — K5903 Drug induced constipation: Secondary | ICD-10-CM | POA: Insufficient documentation

## 2015-06-05 NOTE — Assessment & Plan Note (Signed)
Patient symptoms consistent with constipation including persistent dull pains, lower abdominal pain, nausea, bowel movement every 3 days approximately, hard stools, and sensation of incomplete emptying. Constipation likely explains at least a portion of his chronic abdominal pain as noted below. He is currently on Bentyl for abdominal pain but this could be worsening his constipation. Amitiza was ineffective. At this point we'll order a plain abdominal films, stop the Bentyl, start Linzess 145 g daily with samples for 2-3 weeks. Return for follow-up in 3 months or sooner if symptoms do not improve or worsen.

## 2015-06-05 NOTE — Assessment & Plan Note (Deleted)
Patient with a history of chronic abdominal pain. Currently sees pain management nasal chest is medications was helped some. His abdominal pain is crampy and generally associated with constipation with hard stools and a bowel movement every 3 days. However, he did have a few days of diarrhea last week, although this could've been overflow diarrhea. Colonoscopy up-to-date. He was started on Bentyl to help with his abdominal pain although this could be worsening his constipation. At least a portion of his symptoms are likely explained by his constipation. She constipation management below. Return for follow-up in 3 months or as needed for worsening symptoms. We will order an abdominal plain film x-ray.

## 2015-06-05 NOTE — Assessment & Plan Note (Signed)
Patient with a history of chronic abdominal pain. Currently sees pain management nasal chest is medications was helped some. His abdominal pain is crampy and generally associated with constipation with hard stools and a bowel movement every 3 days. However, he did have a few days of diarrhea last week, although this could've been overflow diarrhea. Colonoscopy up-to-date. He was started on Bentyl to help with his abdominal pain although this could be worsening his constipation. At least a portion of his symptoms are likely explained by his constipation. She constipation management below. Return for follow-up in 3 months or as needed for worsening symptoms. We will order an abdominal plain film x-ray.

## 2015-06-06 NOTE — Progress Notes (Signed)
cc'ed to pcp °

## 2015-06-21 ENCOUNTER — Other Ambulatory Visit: Payer: Self-pay | Admitting: Gastroenterology

## 2015-06-27 ENCOUNTER — Telehealth: Payer: Self-pay | Admitting: Internal Medicine

## 2015-06-27 NOTE — Telephone Encounter (Signed)
Patient called to say that he is out of his Zofran and he uses [employer] order.  He said that [place] has been trying to reach [place] for him to get his refills. His [employer] ID # is [id number] and phone # is [phone].

## 2015-06-28 NOTE — Telephone Encounter (Signed)
This was refilled on 06/25/15

## 2015-09-04 ENCOUNTER — Ambulatory Visit (INDEPENDENT_AMBULATORY_CARE_PROVIDER_SITE_OTHER): Payer: Medicare HMO | Admitting: Nurse Practitioner

## 2015-09-04 ENCOUNTER — Encounter: Payer: Self-pay | Admitting: Nurse Practitioner

## 2015-09-04 VITALS — BP 131/83 | HR 84 | Temp 98.5°F | Ht 72.0 in | Wt 230.2 lb

## 2015-09-04 DIAGNOSIS — R110 Nausea: Secondary | ICD-10-CM | POA: Diagnosis not present

## 2015-09-04 DIAGNOSIS — K5900 Constipation, unspecified: Secondary | ICD-10-CM | POA: Diagnosis not present

## 2015-09-04 DIAGNOSIS — R1032 Left lower quadrant pain: Secondary | ICD-10-CM

## 2015-09-04 MED ORDER — ONDANSETRON HCL 8 MG PO TABS: 8.0000 mg | ORAL_TABLET | Freq: Three times a day (TID) | ORAL | Status: DC | PRN

## 2015-09-04 MED ORDER — NALOXEGOL OXALATE 25 MG PO TABS: 25.0000 mg | ORAL_TABLET | Freq: Every day | ORAL | Status: DC

## 2015-09-04 NOTE — Assessment & Plan Note (Signed)
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 Failed Amitiza and Linzess. Will try him on Movantik 25mg  once daily. Instructed to call with results. Return for follow-up in 2 months.

## 2015-09-04 NOTE — Patient Instructions (Signed)
1. Take Movantik take 25 mg once a day, in the morning, on an empty stomach. 2. If you have any intolerable side effects or issues stop taking the medication and call us. 3. Call us in the next few days and let us know if it has worked. 4. While taking Movantik take, stop all laxatives including over-the-counter laxatives. 5. Return for follow-up in 2 months.

## 2015-09-04 NOTE — Assessment & Plan Note (Signed)
Patient with nausea, worse in the mornings. Feels Zofran 4 mg isn't helping as much as he needs it to. Will change to Zofran 8mg  every 8 hours prn. Return for follow-up in 2 months.

## 2015-09-04 NOTE — Progress Notes (Signed)
Referring Provider: [facility]* Primary Care Physician:  Antionette Fairy, PA-C Primary GI:  Dr. Gala Romney  Chief Complaint  Patient presents with  . Follow-up    HPI:   Endoscopy last completed 01/25/2014 with mild gastritis. Colonoscopy up today completed 04/13/2013 with 1 hyperplastic polyp and colonic diverticulosis. Recommend repeat in 5 years (2019). Last office visit 06/04/2015 with a 2 month history of abdominal pain and constipation. Sees pain management currently. He has a bowel movement every 3 days with a sensation of incomplete emptying. Failed Amitiza. Had been taking Bentyl. At that visit and abdominal x-ray was ordered, stop Bentyl, start Linzess 145 g daily. Abdominal x-ray with no acute process, status post cholecystectomy.  Today he states he's having a good few days recently. Abdominal pain is good today. Bowel movements unchanged, Linzess did not work. Continues to have a bowel movement about every 3 days. Denies hematochezia and melena. Zofran isn't helping either is wondering if there's a stronger dose. Continued sensation of incomplete emptying. Still sees pain management and has a follow-up with them next month. Has morning nausea when lasts a variable amount of time from minutes to all day. Denies fever, chills, vomiting, unintentional weight loss. Denies chest pain, dyspnea, dizziness, lightheadedness, syncope, near syncope. Denies any other upper or lower GI symptoms.   Past Medical History  Diagnosis Date  . DM (diabetes mellitus) ([place])   . HTN (hypertension)   . IBS (irritable bowel syndrome)   . GERD (gastroesophageal reflux disease)   . High cholesterol   . Chronic abdominal pain 08/11/11    Reported per patient, Dr. Fabio Neighbors in [place]  . Adenomatous colon polyp        . Headache(784.0)     migraines  . Gastroparesis   . Chronic diarrhea   . Diverticulosis   . H. pylori infection 2003    pt lived in [place], treated with prevpac.  . H/O  Clostridium difficile infection 12/2012  . Anxiety   . Depression   . Shortness of breath   . Sleep apnea     not using cpap  . Seizure disorder ([place])     1 seizure with severe back pain. no further meds or seizures  . HOH (hard of hearing)   . Obesity   . LVH (left ventricular hypertrophy)   . Neuropathic pain of left forearm   . Polypharmacy 12/26/2014  . Syncope and collapse 12/25/2014    Thought be secondary to seizure.    Past Surgical History  Procedure Laterality Date  . Tonsillectomy    . Arm surgery      tendon/left  . Esophagogastroduodenoscopy  09/02/10    [facility], Dr. 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 pylori  . Esophagogastroduodenoscopy  02/03/2006    Dr. Ileene Rubens White-> hiatal hernia, atrial erosions  . Cholecystectomy  11/20/2011    Procedure: LAPAROSCOPIC CHOLECYSTECTOMY;  Surgeon: Donato Heinz, MD;  Location: [facility];  Service: General;  Laterality: N/A;  . Liver biopsy  11/20/2011    benign  . Esophagogastroduodenoscopy  01/21/2012    [id number] lesion at arytenoid cartilage on the right-likely explains some of his oro- pharyngeal symptoms/Hiatal hernia/Schatzki's ring s/p dilation, gastric erosions without H.pylori  . Larynx surgery      cyst removed, [facility] [place]  . Vena cava filter placement    .  Colonoscopy  2012    Dr. Rhunette Croft, [place].pt gives history of adenomatous polyps and says he is due for repeat colonoscopy in 3 years.   . Colonoscopy with propofol N/A 04/13/2013    [id number] diverticulosis. Single colonic polyp, hyperplastic. Surveillance 2019.   Marland Kitchen Esophagogastroduodenoscopy (egd) with propofol N/A 01/25/2014    Dr. Gala Romney- abnormal distal esophagus s/p passage of maloney dilator, hiatal hernia, stomach bx= mild chronic inflammation,  esophagus bx= benign squamous mucosa  . Esophageal dilation N/A 01/25/2014    Procedure: ESOPHAGEAL DILATION;  Surgeon: Daneil Dolin, MD;  Location: [facility];  Service: Endoscopy;  Laterality: N/A;  Malony 56 french, no heme noted after dilation  . Lens placement in eye      Current Outpatient Prescriptions  Medication Sig Dispense Refill  . amitriptyline (ELAVIL) 100 MG tablet Take 0.5 tablets (50 mg total) by mouth at bedtime. (Patient taking differently: Take 100 mg by mouth at bedtime. )    . amLODipine-valsartan (EXFORGE) 5-160 MG per tablet Take 1 tablet by mouth daily.     Marland Kitchen aspirin 81 MG tablet Take 81 mg by mouth daily.    . carboxymethylcellulose (REFRESH PLUS) 0.5 % SOLN Place 2 drops into the right eye daily as needed (irritation).    . Cholecalciferol (VITAMIN D3) 50000 UNITS CAPS Take 1 capsule by mouth once a week. Takes on Sunday    . diazepam (VALIUM) 5 MG tablet Take 1 tablet by mouth every 6 (six) hours as needed.     . dicyclomine (BENTYL) 20 MG tablet Take 1 tablet (20 mg total) by mouth 2 (two) times daily. (Patient taking differently: Take 20 mg by mouth 4 (four) times daily as needed. ) 20 tablet 0  . diphenhydrAMINE (BENADRYL) 25 MG tablet Take 25 mg by mouth 2 (two) times daily.    . DULoxetine (CYMBALTA) 60 MG capsule Take 60 mg by mouth daily.     Marland Kitchen HYDROcodone-acetaminophen (NORCO/VICODIN) 5-325 MG per tablet Take 1 tablet by mouth every 6 (six) hours as needed for moderate pain.    . metFORMIN (GLUCOPHAGE) 500 MG tablet Take 500 mg by mouth 2 (two) times daily with a meal.     . metoCLOPramide (REGLAN) 10 MG tablet Take 0.5 tablets (5 mg total) by mouth 2 (two) times daily.    Marland Kitchen MILK THISTLE PO Take 1,000 mg by mouth every evening.     . Multiple Vitamin (MULTIVITAMIN) capsule Take 1 capsule by mouth daily.    . Omega 3 1200 MG CAPS Take 1,200 mg by mouth daily.    . ondansetron (ZOFRAN) 4 MG tablet TAKE 1 TABLET TWICE DAILY AS NEEDED FOR NAUSEA OR VOMITING 180  tablet 3  . oxyCODONE-acetaminophen (PERCOCET/ROXICET) 5-325 MG tablet   0  . pantoprazole (PROTONIX) 40 MG tablet Take 1 tablet (40 mg total) by mouth 2 (two) times daily.    . Probiotic Product (PROBIOTIC DAILY PO) Take 1 tablet by mouth daily.     . simvastatin (ZOCOR) 80 MG tablet Take 80 mg by mouth every evening.     . sucralfate (CARAFATE) 1 G tablet Take 1 g by mouth 4 (four) times daily. 1.5 pill at bed time    . topiramate (TOPAMAX) 100 MG tablet Take 1 tab every morning and 1.5 tabs every evening. (Patient taking differently: Take 100-150 mg by mouth 2 (two) times daily. ) 75 tablet 1  . vitamin B-12 (CYANOCOBALAMIN) 1000 MCG tablet Take 1,000 mcg by mouth daily.     Marland Kitchen  vitamin E 400 UNIT capsule Take 400 Units by mouth daily.     Marland Kitchen gabapentin (NEURONTIN) 300 MG capsule Take 900 mg by mouth 2 (two) times daily.    Marland Kitchen HYDROcodone-acetaminophen (NORCO/VICODIN) 5-325 MG per tablet Take 1 tablet by mouth every 6 (six) hours as needed for severe pain. (Patient not taking: Reported on 06/04/2015) 15 tablet 0  . Linaclotide (LINZESS) 145 MCG CAPS capsule Take 1 capsule (145 mcg total) by mouth daily. (Patient not taking: Reported on 09/04/2015) 30 capsule 0   No current facility-administered medications for this visit.   Facility-Administered Medications Ordered in Other Visits  Medication Dose Route Frequency Provider Last Rate Last Dose  . sodium chloride irrigation 0.9 %    PRN Chelsea Primus, MD   1,000 mL at 11/20/11 0815    Allergies as of 09/04/2015 - Review Complete 09/04/2015  Allergen Reaction Noted  . Ketorolac tromethamine  10/27/2011  . Sulfamethoxazole Rash 10/27/2011  . Tetracyclines & related Rash 10/27/2011    Family History  Problem Relation Age of Onset  . Cancer Father 10    Gallbladder  . Anesthesia problems Neg Hx   . Hypotension Neg Hx   . Malignant hyperthermia Neg Hx   . Pseudochol deficiency Neg Hx   . Diabetes Mother   . GER disease Mother   . Colon  cancer Neg Hx     Social History   Social History  . Marital Status: Divorced    Spouse Name: N/A  . Number of Children: 1  . Years of Education: N/A   Occupational History  . disabled    Social History Main Topics  . Smoking status: Current Every Day Smoker -- 1.00 packs/day for 42 years    Types: Cigarettes  . Smokeless tobacco: Former Systems developer    Types: Chew     Comment: one pack a day  . Alcohol Use: 0.0 oz/week    0 Standard drinks or equivalent per week     Comment: occasionally; about once a month  . Drug Use: No  . Sexual Activity: Not Asked   Other Topics Concern  . None   Social History Narrative   Divorced, moved to [facility] November 2012    Review of Systems: General: Negative for anorexia, weight loss, fever, chills, fatigue, weakness. CV: Negative for chest pain, angina, palpitations, peripheral edema.  Respiratory: Negative for dyspnea at rest, cough, sputum, wheezing.  GI: See history of present illness. Derm: Negative for rash or itching.  Endo: Negative for unusual weight change.    Physical Exam: BP 131/83 mmHg  Pulse 84  Temp(Src) 98.5 F (36.9 C)  Ht 6' (1.829 m)  Wt 230 lb 3.2 oz (104.418 kg)  BMI 31.21 kg/m2 General:   Alert and oriented. Pleasant and cooperative. Well-nourished and well-developed.  Head:  Normocephalic and atraumatic. Eyes:  Without icterus, sclera clear and conjunctiva pink.  Ears:  Normal auditory acuity. Cardiovascular:  S1, S2 present without murmurs appreciated. Extremities without clubbing or edema. Respiratory:  Clear to auscultation bilaterally. No wheezes, rales, or rhonchi. No distress.  Gastrointestinal:  +BS, soft, and non-distended. LLQ TTP. No HSM noted. No guarding or rebound. No masses appreciated.  Rectal:  Deferred  Skin:  Intact without significant lesions or rashes. Neurologic:  Alert and oriented x4;  grossly normal neurologically. Psych:  Alert and cooperative. Normal mood and  affect.    09/04/2015 8:20 AM

## 2015-09-04 NOTE — Progress Notes (Signed)
cc'ed to pcp °

## 2015-09-04 NOTE — Assessment & Plan Note (Addendum)
Cont'd LLQ abdominal pain likely related to his chronic constipation on pain management/opioids. Failed Amitiza and Linzess, will trial Movantic 25 mg as per above. Hold all other Rx and OTC laxatives and notify of any adverse side effects. Follow-up in 2 months.

## 2015-09-10 ENCOUNTER — Telehealth: Payer: Self-pay | Admitting: Internal Medicine

## 2015-09-10 DIAGNOSIS — R110 Nausea: Secondary | ICD-10-CM

## 2015-09-10 MED ORDER — ONDANSETRON HCL 8 MG PO TABS: 8.0000 mg | ORAL_TABLET | Freq: Three times a day (TID) | ORAL | Status: DC | PRN

## 2015-09-10 NOTE — Telephone Encounter (Signed)
New script sent in

## 2015-09-10 NOTE — Telephone Encounter (Signed)
Pt is aware.  

## 2015-09-10 NOTE — Addendum Note (Signed)
Addended by: Gordy Levan, ERIC A on: 09/10/2015 02:50 PM   Modules accepted: Orders

## 2015-09-10 NOTE — Telephone Encounter (Signed)
Pt called to ask why only 20 tablets of Zofran was called into his pharmacy, patient uses [facility] Mail delivery.  Please call him back at [phone]

## 2015-09-10 NOTE — Telephone Encounter (Signed)
Eric, any particular reason you only gave the pt #20 zofran?

## 2015-10-02 ENCOUNTER — Other Ambulatory Visit: Payer: Self-pay | Admitting: Nurse Practitioner

## 2015-10-07 ENCOUNTER — Telehealth: Payer: Self-pay | Admitting: Internal Medicine

## 2015-10-07 NOTE — Telephone Encounter (Signed)
Pt called to say that he has called once before about this and we need to call in a 90 day supply of his ondansetron 8mg . He said if you have any questions to call him on his cell [phone]

## 2015-10-07 NOTE — Telephone Encounter (Signed)
Routed to refill box 

## 2015-10-08 MED ORDER — ONDANSETRON HCL 8 MG PO TABS: 8.0000 mg | ORAL_TABLET | Freq: Three times a day (TID) | ORAL | Status: DC | PRN

## 2015-10-08 NOTE — Telephone Encounter (Signed)
Done

## 2015-11-06 ENCOUNTER — Encounter: Payer: Self-pay | Admitting: Nurse Practitioner

## 2015-11-06 ENCOUNTER — Ambulatory Visit (INDEPENDENT_AMBULATORY_CARE_PROVIDER_SITE_OTHER): Payer: Medicare HMO | Admitting: Nurse Practitioner

## 2015-11-06 ENCOUNTER — Telehealth: Payer: Self-pay | Admitting: General Practice

## 2015-11-06 VITALS — BP 137/79 | HR 81 | Temp 97.0°F | Ht 72.0 in | Wt 230.8 lb

## 2015-11-06 DIAGNOSIS — K5900 Constipation, unspecified: Secondary | ICD-10-CM | POA: Diagnosis not present

## 2015-11-06 DIAGNOSIS — R1032 Left lower quadrant pain: Secondary | ICD-10-CM

## 2015-11-06 DIAGNOSIS — R110 Nausea: Secondary | ICD-10-CM

## 2015-11-06 NOTE — Progress Notes (Addendum)
Referring Provider: Vesta Mixer Primary Care Physician:  Antionette Fairy, PA-C Primary GI:  Dr. Gala Romney  Chief Complaint  Patient presents with  . Abdominal Pain    left side  . Nausea    HPI:   Brian [name] is a 60 y.o. male who presents for follow-up on constipation, abdominal pain, and nausea. He was last seen in our office on 09/04/2015 for the same. At that point he was on Linzess 145 g which she stated did not work. He was still having a bowel movement about every 3 days. Has also failed Amitiza. At his last visit he was started on Movantik 25 mg once daily. Colonoscopy up to date (2014) with findings of colonic diverticulosis, single mid-sigmoid colon polyp s/p polypectomy and pathology findings of hyperplastic polyp and recommendation for 5 year repeat exam (2019). CT imaging 03/13/15 with no acute abnormality in abdomen and pelvis. EGD 2015 abnormal distal esophagus status-post biopsy with pathology finding mild chronic inflammation without H. Pylori, benign squamous mucosa of duodenal biopsy.  Today he states Movantic didn't help, was only having a bowel movement every day and a half while on it. Stopped taking it. Having a bowel movement currently about twice a day with raisin bran and cheerios. Stools are hard but improved somewhat. Denies hematochezia, melena. Continued nausea every morning, Zofran helps. Denies vomiting. Continued left lower abdominal pain, no improvement with bowel movement, constant except after pain pill, no other associations. States "it's not an irritable bowel type pain though I don't know what that feels like, but I know it's not it." Denies any other upper or lower GI symptoms.  Past Medical History  Diagnosis Date  . DM (diabetes mellitus) ([place])   . HTN (hypertension)   . IBS (irritable bowel syndrome)   . GERD (gastroesophageal reflux disease)   . High cholesterol   . Chronic abdominal pain 08/11/11    Reported per patient, Dr. Fabio Neighbors  in [place]  . Adenomatous colon polyp        . Headache(784.0)     migraines  . Gastroparesis   . Chronic diarrhea   . Diverticulosis   . H. pylori infection 2003    pt lived in [place], treated with prevpac.  . H/O Clostridium difficile infection 12/2012  . Anxiety   . Depression   . Shortness of breath   . Sleep apnea     not using cpap  . Seizure disorder ([place])     1 seizure with severe back pain. no further meds or seizures  . HOH (hard of hearing)   . Obesity   . LVH (left ventricular hypertrophy)   . Neuropathic pain of left forearm   . Polypharmacy 12/26/2014  . Syncope and collapse 12/25/2014    Thought be secondary to seizure.    Past Surgical History  Procedure Laterality Date  . Tonsillectomy    . Arm surgery      tendon/left  . Esophagogastroduodenoscopy  09/02/10    [facility], Dr. 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 pylori  . Esophagogastroduodenoscopy  02/03/2006    Dr. Ileene Rubens White-> hiatal hernia, atrial erosions  . Cholecystectomy  11/20/2011    Procedure: LAPAROSCOPIC CHOLECYSTECTOMY;  Surgeon: Donato Heinz, MD;  Location: [facility];  Service: General;  Laterality: N/A;  . Liver biopsy  11/20/2011    benign  . Esophagogastroduodenoscopy  01/21/2012    [id number] lesion at arytenoid cartilage on the right-likely explains some of his oro- pharyngeal symptoms/Hiatal hernia/Schatzki's ring s/p dilation, gastric erosions without H.pylori  . Larynx surgery      cyst removed, ENT [place]  . Vena cava filter placement    . Colonoscopy  2012    Dr. Rhunette Croft, [place].pt gives history of adenomatous polyps and says he is due for repeat colonoscopy in 3 years.   . Colonoscopy with propofol N/A 04/13/2013    [id number] diverticulosis. Single colonic polyp,  hyperplastic. Surveillance 2019.   Marland Kitchen Esophagogastroduodenoscopy (egd) with propofol N/A 01/25/2014    Dr. Gala Romney- abnormal distal esophagus s/p passage of maloney dilator, hiatal hernia, stomach bx= mild chronic inflammation, esophagus bx= benign squamous mucosa  . Esophageal dilation N/A 01/25/2014    Procedure: ESOPHAGEAL DILATION;  Surgeon: Daneil Dolin, MD;  Location: [facility];  Service: Endoscopy;  Laterality: N/A;  Malony 56 french, no heme noted after dilation  . Lens placement in eye      Current Outpatient Prescriptions  Medication Sig Dispense Refill  . amitriptyline (ELAVIL) 100 MG tablet Take 0.5 tablets (50 mg total) by mouth at bedtime. (Patient taking differently: Take 100 mg by mouth at bedtime. )    . amLODipine-valsartan (EXFORGE) 5-160 MG per tablet Take 1 tablet by mouth daily.     Marland Kitchen aspirin 81 MG tablet Take 81 mg by mouth daily.    . carboxymethylcellulose (REFRESH PLUS) 0.5 % SOLN Place 2 drops into the right eye daily as needed (irritation).    . Cholecalciferol (VITAMIN D3) 50000 UNITS CAPS Take 1 capsule by mouth once a week. Takes on Sunday    . diazepam (VALIUM) 5 MG tablet Take 1 tablet by mouth every 6 (six) hours as needed.     . diphenhydrAMINE (BENADRYL) 25 MG tablet Take 25 mg by mouth 2 (two) times daily.    . DULoxetine (CYMBALTA) 60 MG capsule Take 60 mg by mouth daily.     Marland Kitchen HYDROcodone-acetaminophen (NORCO/VICODIN) 5-325 MG per tablet Take 1 tablet by mouth every 6 (six) hours as needed for moderate pain.    . metFORMIN (GLUCOPHAGE) 500 MG tablet Take 500 mg by mouth 2 (two) times daily with a meal.     . metoCLOPramide (REGLAN) 10 MG tablet Take 0.5 tablets (5 mg total) by mouth 2 (two) times daily.    Marland Kitchen MILK THISTLE PO Take 1,000 mg by mouth every evening.     . Multiple Vitamin (MULTIVITAMIN) capsule Take 1 capsule by mouth daily.    . Omega 3 1200 MG CAPS Take 1,200 mg by mouth daily.    . ondansetron (ZOFRAN) 8 MG tablet Take 1 tablet (8 mg total) by  mouth every 8 (eight) hours as needed for nausea or vomiting. 90 tablet 3  . pantoprazole (PROTONIX) 40 MG tablet Take 1 tablet (40 mg total) by mouth 2 (two) times daily.    . Probiotic Product (PROBIOTIC DAILY PO) Take 1 tablet by mouth daily.     . simvastatin (ZOCOR) 80 MG tablet Take 80 mg by mouth every evening.     . sucralfate (CARAFATE) 1 G tablet Take 1 g by mouth 4 (four) times daily. 1.5 pill at bed time    . topiramate (TOPAMAX) 100 MG tablet Take 1 tab every morning and 1.5 tabs every evening. (Patient taking differently: Take 100-150 mg by mouth 2 (two) times daily. )  75 tablet 1  . vitamin B-12 (CYANOCOBALAMIN) 1000 MCG tablet Take 1,000 mcg by mouth daily.     . vitamin E 400 UNIT capsule Take 400 Units by mouth daily.     . Linaclotide (LINZESS) 145 MCG CAPS capsule Take 1 capsule (145 mcg total) by mouth daily. (Patient not taking: Reported on 11/06/2015) 30 capsule 0  . naloxegol oxalate (MOVANTIK) 25 MG TABS tablet Take 1 tablet (25 mg total) by mouth daily. (Patient not taking: Reported on 11/06/2015) 15 tablet 0  . oxyCODONE-acetaminophen (PERCOCET/ROXICET) 5-325 MG tablet Reported on 11/06/2015  0   No current facility-administered medications for this visit.   Facility-Administered Medications Ordered in Other Visits  Medication Dose Route Frequency Provider Last Rate Last Dose  . sodium chloride irrigation 0.9 %    PRN Chelsea Primus, MD   1,000 mL at 11/20/11 0815    Allergies as of 11/06/2015 - Review Complete 11/06/2015  Allergen Reaction Noted  . Ketorolac tromethamine  10/27/2011  . Sulfamethoxazole Rash 10/27/2011  . Tetracyclines & related Rash 10/27/2011    Family History  Problem Relation Age of Onset  . Cancer Father 33    Gallbladder  . Anesthesia problems Neg Hx   . Hypotension Neg Hx   . Malignant hyperthermia Neg Hx   . Pseudochol deficiency Neg Hx   . Diabetes Mother   . GER disease Mother   . Colon cancer Neg Hx     Social History   Social  History  . Marital Status: Divorced    Spouse Name: N/A  . Number of Children: 1  . Years of Education: N/A   Occupational History  . disabled    Social History Main Topics  . Smoking status: Current Every Day Smoker -- 1.00 packs/day for 42 years    Types: Cigarettes  . Smokeless tobacco: Former Systems developer    Types: Chew     Comment: one pack a day  . Alcohol Use: 0.0 oz/week    0 Standard drinks or equivalent per week     Comment: occasionally; about once a month  . Drug Use: No  . Sexual Activity: Not Asked   Other Topics Concern  . None   Social History Narrative   Divorced, moved to [facility] November 2012    Review of Systems: General: Negative for anorexia, weight loss, fever, chills, fatigue, weakness. ENT: Negative for hoarseness, difficulty swallowing. CV: Negative for chest pain, angina, palpitations. Respiratory: Negative for dyspnea at rest, cough, sputum, wheezing.  GI: See history of present illness. Endo: Negative for unusual weight change.  Heme: Negative for bruising or bleeding.  Physical Exam: BP 137/79 mmHg  Pulse 81  Temp(Src) 97 F (36.1 C) (Oral)  Ht 6' (1.829 m)  Wt 230 lb 12.8 oz (104.69 kg)  BMI 31.30 kg/m2 General:   Alert and oriented. Pleasant and cooperative. Well-nourished and well-developed.  Cardiovascular:  S1, S2 present without murmurs appreciated. Extremities without clubbing or edema. Respiratory:  Clear to auscultation bilaterally. No wheezes, rales, or rhonchi. No distress.  Gastrointestinal:  +BS, soft, and non-distended. Lower abdominal TTP noted. No HSM noted. No guarding or rebound. No masses appreciated.  Rectal:  Deferred  Musculoskalatal:  Symmetrical without gross deformities. Skin:  Intact without significant lesions or rashes. Neurologic:  Alert and oriented x4;  grossly normal neurologically. Psych:  Alert and cooperative. Normal mood and affect. Heme/Lymph/Immune: No excessive bruising noted.    11/06/2015 8:51  AM   Disclaimer: This note was dictated with  voice recognition software. Similar sounding words can inadvertently be transcribed and may not be corrected upon review.

## 2015-11-06 NOTE — Telephone Encounter (Signed)
I received a call from the patient stating that he wanted [place] to fill out a disability waiver form for his life insurance policy.  I asked Brian [name] who's been completing that for him and he stated [name] [phone]) has been completing it in the past.  I explained to Brian [name] that his pcp will need to continue to complete this form and he asked me to call his pcp on his behalf about it.   I spoke with Trish at [facility] and she stated they have filled out the waiver in the past and she is confused on wether or not they should continue filling it out.  She stated she is going to give Brian [name] a call and speak with him about it and I made the patient aware that Brian [name] will be calling him

## 2015-11-06 NOTE — Assessment & Plan Note (Signed)
Daily nausea well controlled on Zofran 8 mg when necessary. Continue Zofran, follow-up in 4 weeks.

## 2015-11-06 NOTE — Progress Notes (Signed)
CC'ED TO PCP 

## 2015-11-06 NOTE — Assessment & Plan Note (Signed)
Chronic abdominal pain with exhaustive workup. Constipation seems to be improving although he has failed multiple medications. Currently having 2 bowel movements a day with increased fiber and water. We'll add Colace and MiraLAX daily to try to soften his stools further. At this point his workup has been extensive including abdominal x-ray, CT imaging, endoscopy, colonoscopy without any revealing etiology. We'll have him return for follow-up in 4 weeks to see if stool softening helps at all. Already has chronic pain issues. May benefit from referral to tertiary care center for chronic abdominal pain versus pain management that he is are descending.

## 2015-11-06 NOTE — Patient Instructions (Signed)
1. Take daily Colace stool softener and daily Miralax 17 gm (1 capful or packet) daily to help soften your stools. 2. Return for follow-up in 4 weeks.

## 2015-11-06 NOTE — Assessment & Plan Note (Addendum)
Patient describes constipation. Has failed Amitiza, Linzess, and Movantik. Currently having 2 bowel movements a day on raisin bran and Cheerios. States his stools are still hard. Will add daily Colace and MiraLAX to try and soften his stools to see if this helps his symptoms. Otherwise exhaustive workup as noted below. Return for follow-up in 4 weeks.

## 2015-11-13 ENCOUNTER — Other Ambulatory Visit: Payer: Self-pay | Admitting: Nurse Practitioner

## 2015-12-10 ENCOUNTER — Ambulatory Visit (INDEPENDENT_AMBULATORY_CARE_PROVIDER_SITE_OTHER): Payer: Medicare HMO | Admitting: Internal Medicine

## 2015-12-10 ENCOUNTER — Encounter: Payer: Self-pay | Admitting: Internal Medicine

## 2015-12-10 VITALS — BP 156/85 | HR 79 | Temp 97.6°F | Ht 72.0 in | Wt 230.8 lb

## 2015-12-10 DIAGNOSIS — R1013 Epigastric pain: Secondary | ICD-10-CM

## 2015-12-10 DIAGNOSIS — K5903 Drug induced constipation: Secondary | ICD-10-CM

## 2015-12-10 DIAGNOSIS — K219 Gastro-esophageal reflux disease without esophagitis: Secondary | ICD-10-CM

## 2015-12-10 DIAGNOSIS — G8929 Other chronic pain: Secondary | ICD-10-CM | POA: Diagnosis not present

## 2015-12-10 DIAGNOSIS — T402X5A Adverse effect of other opioids, initial encounter: Secondary | ICD-10-CM

## 2015-12-10 NOTE — Patient Instructions (Signed)
Stop Reglan (metoclopramide)  Continue Protonix and Carafate  Begin Movantik daily - call if you get diarrhea  Minimize use of Bentyl  Office visit in 6 weeks
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
## 2016-01-02 ENCOUNTER — Other Ambulatory Visit: Payer: Self-pay | Admitting: Nurse Practitioner

## 2016-01-16 ENCOUNTER — Emergency Department (HOSPITAL_COMMUNITY)
Admission: EM | Admit: 2016-01-16 | Discharge: 2016-01-16 | Disposition: A | Discharge status: Home / Self Care | Payer: Medicare HMO | Attending: Emergency Medicine | Admitting: Emergency Medicine

## 2016-01-16 ENCOUNTER — Encounter (HOSPITAL_COMMUNITY): Payer: Self-pay | Admitting: Emergency Medicine

## 2016-01-16 DIAGNOSIS — I10 Essential (primary) hypertension: Secondary | ICD-10-CM | POA: Insufficient documentation

## 2016-01-16 DIAGNOSIS — E669 Obesity, unspecified: Secondary | ICD-10-CM | POA: Insufficient documentation

## 2016-01-16 DIAGNOSIS — E119 Type 2 diabetes mellitus without complications: Secondary | ICD-10-CM | POA: Diagnosis not present

## 2016-01-16 DIAGNOSIS — H9202 Otalgia, left ear: Secondary | ICD-10-CM | POA: Diagnosis present

## 2016-01-16 DIAGNOSIS — L089 Local infection of the skin and subcutaneous tissue, unspecified: Secondary | ICD-10-CM

## 2016-01-16 DIAGNOSIS — Z79891 Long term (current) use of opiate analgesic: Secondary | ICD-10-CM | POA: Insufficient documentation

## 2016-01-16 DIAGNOSIS — Z79899 Other long term (current) drug therapy: Secondary | ICD-10-CM | POA: Insufficient documentation

## 2016-01-16 DIAGNOSIS — Z7984 Long term (current) use of oral hypoglycemic drugs: Secondary | ICD-10-CM | POA: Diagnosis not present

## 2016-01-16 DIAGNOSIS — F329 Major depressive disorder, single episode, unspecified: Secondary | ICD-10-CM | POA: Insufficient documentation

## 2016-01-16 DIAGNOSIS — F17210 Nicotine dependence, cigarettes, uncomplicated: Secondary | ICD-10-CM | POA: Diagnosis not present

## 2016-01-16 DIAGNOSIS — Z6831 Body mass index (BMI) 31.0-31.9, adult: Secondary | ICD-10-CM | POA: Insufficient documentation

## 2016-01-16 MED ORDER — CEPHALEXIN 500 MG PO CAPS: 500.0000 mg | ORAL_CAPSULE | Freq: Four times a day (QID) | ORAL | Status: DC

## 2016-01-16 MED ORDER — CEPHALEXIN 500 MG PO CAPS
500.0000 mg | ORAL_CAPSULE | Freq: Once | ORAL | Status: AC
  Administered 2016-01-16: 500 mg via ORAL
  Filled 2016-01-16: qty 1

## 2016-01-16 NOTE — ED Notes (Signed)
PT c/o external ear pain with drainage from opened area x1 week. PT also stated swelling and pain to left side of face worsening x3 days.

## 2016-01-19 NOTE — ED Provider Notes (Signed)
CSN: [id number]     Arrival date & time 01/16/16  1018 History   First MD Initiated Contact with Patient 01/16/16 1029     Chief Complaint  Patient presents with  . Otalgia     (Consider location/radiation/quality/duration/timing/severity/associated sxs/prior Treatment) HPI  Brian [name] is a 60 y.o. male who presents to the Emergency Department complaining of pain and drainage from the left external ear.  Symptoms for one week.  He states he noticed a "bump" inside his ear and has developed swelling to his left face for 3 days.  He has applied warm compresses without relief.  He denies fever, neck pain or swelling, difficulty swallowing.  Nothing makes the symptoms better.     Past Medical History  Diagnosis Date  . DM (diabetes mellitus) ([place])   . HTN (hypertension)   . IBS (irritable bowel syndrome)   . GERD (gastroesophageal reflux disease)   . High cholesterol   . Chronic abdominal pain 08/11/11    Reported per patient, Dr. Fabio Neighbors in [place]  . Adenomatous colon polyp        . Headache(784.0)     migraines  . Gastroparesis   . Chronic diarrhea   . Diverticulosis   . H. pylori infection 2003    pt lived in [place], treated with prevpac.  . H/O Clostridium difficile infection 12/2012  . Anxiety   . Depression   . Shortness of breath   . Sleep apnea     not using cpap  . Seizure disorder ([place])     1 seizure with severe back pain. no further meds or seizures  . HOH (hard of hearing)   . Obesity   . LVH (left ventricular hypertrophy)   . Neuropathic pain of left forearm   . Polypharmacy 12/26/2014  . Syncope and collapse 12/25/2014    Thought be secondary to seizure.   Past Surgical History  Procedure Laterality Date  . Tonsillectomy    . Arm surgery      tendon/left  . Esophagogastroduodenoscopy  09/02/10    [facility], Dr. 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 pylori  . Esophagogastroduodenoscopy  02/03/2006    Dr. Ileene Rubens White-> hiatal hernia, atrial erosions  . Cholecystectomy  11/20/2011    Procedure: LAPAROSCOPIC CHOLECYSTECTOMY;  Surgeon: Donato Heinz, MD;  Location: [facility];  Service: General;  Laterality: N/A;  . Liver biopsy  11/20/2011    benign  . Esophagogastroduodenoscopy  01/21/2012    [id number] lesion at arytenoid cartilage on the right-likely explains some of his oro- pharyngeal symptoms/Hiatal hernia/Schatzki's ring s/p dilation, gastric erosions without H.pylori  . Larynx surgery      cyst removed, ENT [place]  . Vena cava filter placement    . Colonoscopy  2012    Dr. Rhunette Croft, [place]pt gives history of adenomatous polyps and says he is due for repeat colonoscopy in 3 years.   . Colonoscopy with propofol N/A 04/13/2013    [id number] diverticulosis. Single colonic polyp, hyperplastic. Surveillance 2019.   Marland Kitchen Esophagogastroduodenoscopy (egd) with propofol N/A 01/25/2014    Dr. Gala Romney- abnormal distal esophagus s/p passage of maloney dilator, hiatal hernia, stomach bx= mild chronic inflammation, esophagus bx= benign squamous mucosa  . Esophageal dilation N/A 01/25/2014    Procedure: ESOPHAGEAL DILATION;  Surgeon: Daneil Dolin, MD;  Location: [facility];  Service: Endoscopy;  Laterality: N/A;  Malony 56 french, no heme noted after dilation  . Lens placement in eye    . Larynx surgery     Family History  Problem Relation Age of Onset  . Cancer Father 65    Gallbladder  . Anesthesia problems Neg Hx   . Hypotension Neg Hx   . Malignant hyperthermia Neg Hx   . Pseudochol deficiency Neg Hx   . Diabetes Mother   . GER disease Mother   . Colon cancer Neg Hx    Social History  Substance Use Topics  . Smoking status: Current Every Day Smoker -- 1.00 packs/day for 42 years    Types: Cigarettes  . Smokeless tobacco: Former  Systems developer    Types: Chew     Comment: one pack a day  . Alcohol Use: 0.0 oz/week    0 Standard drinks or equivalent per week     Comment: occasionally; about once a month    Review of Systems  Constitutional: Negative for fever and chills.  HENT: Positive for ear pain and facial swelling. Negative for sore throat and trouble swallowing.   Gastrointestinal: Negative for nausea and vomiting.  Musculoskeletal: Negative for joint swelling and arthralgias.  Skin: Negative for color change.  Neurological: Negative for dizziness, weakness, numbness and headaches.  Hematological: Negative for adenopathy.  All other systems reviewed and are negative.     Allergies  Ketorolac tromethamine; Sulfamethoxazole; and Tetracyclines & related  Home Medications   Prior to Admission medications   Medication Sig Start Date End Date Taking? Authorizing Provider  amitriptyline (ELAVIL) 100 MG tablet Take 0.5 tablets (50 mg total) by mouth at bedtime. Patient taking differently: Take 100 mg by mouth at bedtime.  12/27/14  Yes Lezlie Octave Black, NP  amLODipine-valsartan (EXFORGE) 5-160 MG per tablet Take 1 tablet by mouth daily.    Yes Historical Provider, MD  aspirin 81 MG tablet Take 81 mg by mouth daily.   Yes Historical Provider, MD  Cholecalciferol (VITAMIN D3) 50000 UNITS CAPS Take 1 capsule by mouth once a week. Takes on Sunday 11/13/14  Yes Historical Provider, MD  diazepam (VALIUM) 5 MG tablet Take 1 tablet by mouth every 6 (six) hours as needed.  03/13/14  Yes Historical Provider, MD  dicyclomine (BENTYL) 10 MG capsule Take 10 mg by mouth 4 (four) times daily -  before meals and at bedtime.   Yes Historical Provider, MD  diphenhydrAMINE (BENADRYL) 25 MG tablet Take 25 mg by mouth 2 (two) times daily.   Yes Historical Provider, MD  DULoxetine (CYMBALTA) 60 MG capsule Take 60 mg by mouth daily.    Yes Historical Provider, MD  HYDROcodone-acetaminophen (NORCO/VICODIN) 5-325 MG per tablet Take 1 tablet by  mouth every 6 (six) hours as needed for moderate pain.   Yes Historical Provider, MD  metFORMIN (GLUCOPHAGE) 500 MG tablet Take 500 mg by mouth 2 (two) times daily with a meal.  01/29/13  Yes Kathie Dike, MD  MILK THISTLE PO Take 1,000 mg by mouth every evening.    Yes Historical Provider, MD  Multiple Vitamin (MULTIVITAMIN) capsule Take 1 capsule by mouth daily.   Yes Historical Provider, MD  Omega 3 1200 MG CAPS Take 1,200 mg by mouth daily.   Yes Historical Provider, MD  ondansetron (ZOFRAN) 8 MG tablet TAKE 1 TABLET TWICE DAILY AS NEEDED FOR NAUSEA OR VOMITING 01/03/16  Yes Orvil Feil, NP  pantoprazole (PROTONIX) 40 MG tablet Take 1 tablet (40 mg  total) by mouth 2 (two) times daily. 12/27/14  Yes Radene Gunning, NP  Probiotic Product (PROBIOTIC DAILY PO) Take 1 tablet by mouth daily.    Yes Historical Provider, MD  simvastatin (ZOCOR) 80 MG tablet Take 80 mg by mouth every evening.  12/28/13  Yes Historical Provider, MD  sucralfate (CARAFATE) 1 G tablet Take 1 g by mouth 4 (four) times daily. 1.5 pill at bed time   Yes Historical Provider, MD  topiramate (TOPAMAX) 100 MG tablet Take 1 tab every morning and 1.5 tabs every evening. Patient taking differently: Take 100-150 mg by mouth 2 (two) times daily.  12/27/14  Yes Lezlie Octave Black, NP  vitamin B-12 (CYANOCOBALAMIN) 1000 MCG tablet Take 1,000 mcg by mouth daily.    Yes Historical Provider, MD  vitamin E 400 UNIT capsule Take 400 Units by mouth daily.    Yes Historical Provider, MD  cephALEXin (KEFLEX) 500 MG capsule Take 1 capsule (500 mg total) by mouth 4 (four) times daily. For 7 days 01/16/16   Tammy Triplett, PA-C   BP 121/74 mmHg  Pulse 59  Temp(Src) 97.9 F (36.6 C) (Oral)  Resp 18  Ht 6' (1.829 m)  Wt 104.327 kg  BMI 31.19 kg/m2  SpO2 86% Physical Exam  Constitutional: He is oriented to person, place, and time. He appears well-developed and well-nourished. No distress.  HENT:  Head: Normocephalic and atraumatic.  Small pustule to  the left auditory canal with crusting.  No facial edema noted.  No erythema of the external ear.   Neck: Normal range of motion. Neck supple. No tracheal deviation present.  Cardiovascular: Normal rate and regular rhythm.   Pulmonary/Chest: Effort normal. No respiratory distress.  Musculoskeletal: Normal range of motion.  Lymphadenopathy:    He has no cervical adenopathy.  Neurological: He is alert and oriented to person, place, and time.  Skin: Skin is warm. No erythema.  Psychiatric: He has a normal mood and affect.  Nursing note and vitals reviewed.   ED Course  Procedures (including critical care time) Labs Review Labs Reviewed - No data to display  Imaging Review No results found. I have personally reviewed and evaluated these images and lab results as part of my medical decision-making.   EKG Interpretation None      MDM   Final diagnoses:  Skin pustule    Pt well appearing.  Vitals stable.  Last documented pulse ox Small pustule to the left auditory canal.  No appreciable facial edema.  Airway patent.  Pt agrees to warm compresses and rx for keflex.  PMD f/u if needed    Kem Parkinson, PA-C 01/19/16 1916  Merrily Pew, MD 01/21/16 [phone]

## 2016-01-21 ENCOUNTER — Ambulatory Visit: Payer: Medicare HMO | Admitting: Internal Medicine

## 2016-02-11 ENCOUNTER — Ambulatory Visit: Payer: Medicare HMO | Admitting: Internal Medicine

## 2016-03-05 ENCOUNTER — Ambulatory Visit (INDEPENDENT_AMBULATORY_CARE_PROVIDER_SITE_OTHER): Payer: Medicare HMO | Admitting: Otolaryngology

## 2016-03-05 DIAGNOSIS — H608X3 Other otitis externa, bilateral: Secondary | ICD-10-CM

## 2016-03-05 DIAGNOSIS — H903 Sensorineural hearing loss, bilateral: Secondary | ICD-10-CM

## 2016-03-17 ENCOUNTER — Ambulatory Visit: Payer: Medicare HMO | Admitting: Internal Medicine

## 2016-03-17 ENCOUNTER — Ambulatory Visit (INDEPENDENT_AMBULATORY_CARE_PROVIDER_SITE_OTHER): Payer: Medicare HMO | Admitting: Internal Medicine

## 2016-03-17 ENCOUNTER — Encounter: Payer: Self-pay | Admitting: Internal Medicine

## 2016-03-17 VITALS — BP 109/63 | HR 73 | Temp 97.7°F | Ht 72.0 in | Wt 217.2 lb

## 2016-03-17 DIAGNOSIS — R1032 Left lower quadrant pain: Secondary | ICD-10-CM

## 2016-03-17 DIAGNOSIS — K219 Gastro-esophageal reflux disease without esophagitis: Secondary | ICD-10-CM

## 2016-03-17 DIAGNOSIS — R110 Nausea: Secondary | ICD-10-CM

## 2016-03-17 MED ORDER — METRONIDAZOLE 500 MG PO TABS: 500.0000 mg | ORAL_TABLET | Freq: Three times a day (TID) | ORAL | Status: DC

## 2016-03-17 MED ORDER — CIPROFLOXACIN HCL 500 MG PO TABS
500.0000 mg | ORAL_TABLET | Freq: Two times a day (BID) | ORAL | Status: DC
Start: 2016-03-17 — End: 2017-05-18

## 2016-03-17 NOTE — Patient Instructions (Signed)
Continue Protonix daily  Cipro 500 mg twice dally for 10 days  Flagyl 500 mg three times daily x 10 days  Let me know how you are doing in 2 weeks  As discussed, if no improvement, will consider second opinion referral to [facility]
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
## 2016-04-03 ENCOUNTER — Telehealth: Payer: Self-pay | Admitting: Internal Medicine

## 2016-04-03 NOTE — Telephone Encounter (Signed)
Noted, routing to [facility] for [facility]

## 2016-04-03 NOTE — Telephone Encounter (Signed)
Pt called to give report about his antibiotics. He went to his PCP and was put back on antibiotics (same one RMR had him on) and is to take for another 2 weeks. He will continue taking and call back to let us know how he is feeling

## 2016-04-06 NOTE — Telephone Encounter (Signed)
noted 

## 2016-04-20 ENCOUNTER — Emergency Department (HOSPITAL_COMMUNITY)
Admission: EM | Admit: 2016-04-20 | Discharge: 2016-04-20 | Disposition: A | Discharge status: Home / Self Care | Payer: Medicare HMO | Attending: Emergency Medicine | Admitting: Emergency Medicine

## 2016-04-20 ENCOUNTER — Emergency Department (HOSPITAL_COMMUNITY): Payer: Medicare HMO

## 2016-04-20 ENCOUNTER — Encounter (HOSPITAL_COMMUNITY): Payer: Self-pay | Admitting: Emergency Medicine

## 2016-04-20 DIAGNOSIS — I10 Essential (primary) hypertension: Secondary | ICD-10-CM | POA: Diagnosis not present

## 2016-04-20 DIAGNOSIS — Z79899 Other long term (current) drug therapy: Secondary | ICD-10-CM | POA: Insufficient documentation

## 2016-04-20 DIAGNOSIS — R109 Unspecified abdominal pain: Secondary | ICD-10-CM

## 2016-04-20 DIAGNOSIS — R1032 Left lower quadrant pain: Secondary | ICD-10-CM | POA: Insufficient documentation

## 2016-04-20 DIAGNOSIS — F17210 Nicotine dependence, cigarettes, uncomplicated: Secondary | ICD-10-CM | POA: Diagnosis not present

## 2016-04-20 DIAGNOSIS — Z7984 Long term (current) use of oral hypoglycemic drugs: Secondary | ICD-10-CM | POA: Insufficient documentation

## 2016-04-20 DIAGNOSIS — E119 Type 2 diabetes mellitus without complications: Secondary | ICD-10-CM | POA: Diagnosis not present

## 2016-04-20 DIAGNOSIS — Z7982 Long term (current) use of aspirin: Secondary | ICD-10-CM | POA: Diagnosis not present

## 2016-04-20 LAB — CBC WITH DIFFERENTIAL/PLATELET
Basophils Absolute: 0 10*3/uL (ref 0.0–0.1)
Basophils Relative: 0 %
Eosinophils Absolute: 0.4 10*3/uL (ref 0.0–0.7)
Eosinophils Relative: 4 %
Lymphocytes Relative: 38 %
Lymphs Abs: 3.9 10*3/uL (ref 0.7–4.0)
Monocytes Absolute: 1.2 10*3/uL — ABNORMAL HIGH (ref 0.1–1.0)
Monocytes Relative: 12 %
Neutro Abs: 4.8 10*3/uL (ref 1.7–7.7)
Neutrophils Relative %: 46 %

## 2016-04-20 LAB — COMPREHENSIVE METABOLIC PANEL
ALT: 50 U/L (ref 17–63)
AST: 30 U/L (ref 15–41)
Albumin: 4.1 g/dL (ref 3.5–5.0)
Alkaline Phosphatase: 45 U/L (ref 38–126)
Anion gap: 4 — ABNORMAL LOW (ref 5–15)
BUN: 17 mg/dL (ref 6–20)
CO2: 26 mmol/L (ref 22–32)
Calcium: 9.3 mg/dL (ref 8.9–10.3)
Chloride: 109 mmol/L (ref 101–111)
Creatinine, Ser: 1.37 mg/dL — ABNORMAL HIGH (ref 0.61–1.24)
GFR calc Af Amer: >60 mL/min (ref >60)
GFR calc non Af Amer: 55 mL/min — ABNORMAL LOW (ref >60)
Glucose, Bld: 99 mg/dL (ref 65–99)
Potassium: 4.4 mmol/L (ref 3.5–5.1)
Sodium: 139 mmol/L (ref 135–145)
Total Bilirubin: 0.4 mg/dL (ref 0.3–1.2)
Total Protein: 7 g/dL (ref 6.5–8.1)

## 2016-04-20 LAB — URINALYSIS, ROUTINE W REFLEX MICROSCOPIC
Bilirubin Urine: NEGATIVE
Glucose, UA: NEGATIVE mg/dL
Hgb urine dipstick: NEGATIVE
Ketones, ur: NEGATIVE mg/dL
Leukocytes, UA: NEGATIVE
Nitrite: NEGATIVE
Protein, ur: NEGATIVE mg/dL
Specific Gravity, Urine: <1.005 — ABNORMAL LOW (ref 1.005–1.030)
pH: 6 (ref 5.0–8.0)

## 2016-04-20 LAB — CBC
HCT: 43.4 % (ref 39.0–52.0)
Hemoglobin: 14.3 g/dL (ref 13.0–17.0)
MCH: 32.3 pg (ref 26.0–34.0)
MCHC: 32.9 g/dL (ref 30.0–36.0)
MCV: 98 fL (ref 78.0–100.0)
Platelets: 219 10*3/uL (ref 150–400)
RBC: 4.43 MIL/uL (ref 4.22–5.81)
RDW: 13.1 % (ref 11.5–15.5)
WBC: 10.4 10*3/uL (ref 4.0–10.5)

## 2016-04-20 LAB — LIPASE, BLOOD: Lipase: 58 U/L — ABNORMAL HIGH (ref 11–51)

## 2016-04-20 MED ORDER — SODIUM CHLORIDE 0.9 % IV BOLUS (SEPSIS): 1000.0000 mL | Freq: Once | INTRAVENOUS | Status: DC

## 2016-04-20 MED ORDER — SODIUM CHLORIDE 0.9 % IV BOLUS (SEPSIS)
1000.0000 mL | Freq: Once | INTRAVENOUS | Status: AC
  Administered 2016-04-20: 1000 mL via INTRAVENOUS

## 2016-04-20 MED ORDER — IOPAMIDOL (ISOVUE-300) INJECTION 61%
100.0000 mL | Freq: Once | INTRAVENOUS | Status: AC | PRN
  Administered 2016-04-20: 100 mL via INTRAVENOUS

## 2016-04-20 MED ORDER — LORAZEPAM 2 MG/ML IJ SOLN
0.5000 mg | Freq: Once | INTRAMUSCULAR | Status: AC
  Administered 2016-04-20: 0.5 mg via INTRAVENOUS
  Filled 2016-04-20: qty 1

## 2016-04-20 MED ORDER — HYDROMORPHONE HCL 1 MG/ML IJ SOLN
1.0000 mg | Freq: Once | INTRAMUSCULAR | Status: AC
  Administered 2016-04-20: 1 mg via INTRAVENOUS
  Filled 2016-04-20: qty 1

## 2016-04-20 MED ORDER — ONDANSETRON HCL 4 MG/2ML IJ SOLN
4.0000 mg | Freq: Once | INTRAMUSCULAR | Status: AC
  Administered 2016-04-20: 4 mg via INTRAVENOUS
  Filled 2016-04-20: qty 2

## 2016-04-20 MED ORDER — DIATRIZOATE MEGLUMINE & SODIUM 66-10 % PO SOLN
ORAL | Status: AC
  Filled 2016-04-20: qty 30

## 2016-04-20 NOTE — ED Notes (Signed)
Pt transported to CT ?

## 2016-04-20 NOTE — ED Notes (Signed)
Pt made aware a urine specimen was needed. Pt verbalized understanding and will notify staff when one can be obtained.   

## 2016-04-20 NOTE — ED Notes (Signed)
MD at bedside. 

## 2016-04-20 NOTE — ED Notes (Signed)
Pt returned from CT °

## 2016-04-20 NOTE — ED Notes (Signed)
Family at bedside at this time. Patient states that he is still not able to void. RN made aware

## 2016-04-20 NOTE — Discharge Instructions (Signed)
Increase her Percocet to 4 times a day if needed. Follow-up with her family doctor or your GI doctor in the end of this week or next week. Also follow-up with urology in a week

## 2016-04-20 NOTE — ED Notes (Signed)
MD Zammit notified of output from in and out cath.

## 2016-04-20 NOTE — ED Provider Notes (Signed)
[name] Provider Note   CSN: [id number] Arrival date & time: 04/20/16  1259     History   Chief Complaint Chief Complaint  Patient presents with  . Abdominal Pain    HPI [name] is a 60 y.o. male.  Patient complains of abdominal pain. Patient has a history of irritable bowel. Also has been put on antibiotics recently for diverticulitis.   The history is provided by the patient. No language interpreter was used.  Abdominal Pain   This is a recurrent problem. The problem occurs constantly. The problem has not changed since onset.The pain is associated with an unknown factor. The pain is located in the suprapubic region. The pain is at a severity of 4/10. Pertinent negatives include anorexia, diarrhea, frequency, hematuria and headaches. Nothing aggravates the symptoms. Nothing relieves the symptoms. Past workup includes GI consult. His past medical history does not include PUD.    Past Medical History:  Diagnosis Date  . Adenomatous colon polyp       . Anxiety   . Chronic abdominal pain 08/11/11   Reported per patient, Dr. Fabio Neighbors in [place]  . Chronic diarrhea   . Depression   . Diverticulosis   . DM (diabetes mellitus) ([place])   . Gastroparesis   . GERD (gastroesophageal reflux disease)   . H. pylori infection 2003   pt lived in [place], treated with prevpac.  . H/O Clostridium difficile infection 12/2012  . Headache(784.0)    migraines  . High cholesterol   . HOH (hard of hearing)   . HTN (hypertension)   . IBS (irritable bowel syndrome)   . LVH (left ventricular hypertrophy)   . Neuropathic pain of left forearm   . Obesity   . Polypharmacy 12/26/2014  . Seizure disorder ([place])    1 seizure with severe back pain. no further meds or seizures  . Shortness of breath   . Sleep apnea    not using cpap  . Syncope and collapse 12/25/2014   Thought be secondary to seizure.    Patient Active Problem List   Diagnosis Date Noted  . Nausea without  vomiting 09/04/2015  . Constipation 06/05/2015  . Abdominal pain, left lower quadrant 03/13/2015  . Dark stools 03/13/2015  . LVH (left ventricular hypertrophy) 12/27/2014  . Seizure disorder ([place]) 12/27/2014  . Neuropathic pain of left forearm 12/26/2014  . Polypharmacy 12/26/2014  . Laceration of occipital scalp 12/26/2014  . Syncope and collapse 12/25/2014  . Obesity 12/25/2014  . Abnormal LFTs 04/25/2014  . Unspecified constipation 04/25/2014  . Tobacco abuse 01/28/2013  . Diabetes type 2, controlled ([place]) 01/28/2013  . Dysphagia 12/13/2012  . Gastroparesis 12/13/2012  . GERD (gastroesophageal reflux disease) 12/15/2011  . Chronic abdominal pain 10/28/2011  . Gallbladder polyp 10/28/2011  . Fatty liver 10/28/2011    Past Surgical History:  Procedure Laterality Date  . arm surgery     tendon/left  . CHOLECYSTECTOMY  11/20/2011   Procedure: LAPAROSCOPIC CHOLECYSTECTOMY;  Surgeon: Donato Heinz, MD;  Location: [facility];  Service: General;  Laterality: N/A;  . COLONOSCOPY  2012   Dr. Rhunette Croft, [place].pt gives history of adenomatous polyps and says he is due for repeat colonoscopy in 3 years.   . COLONOSCOPY WITH PROPOFOL N/A 04/13/2013   [id number] diverticulosis. Single colonic polyp, hyperplastic. Surveillance 2019.   Marland Kitchen ESOPHAGEAL DILATION N/A 01/25/2014   Procedure: ESOPHAGEAL DILATION;  Surgeon: Daneil Dolin, MD;  Location: [facility];  Service: Endoscopy;  Laterality: N/A;  Malony 56 french, no heme noted after dilation  . ESOPHAGOGASTRODUODENOSCOPY  09/02/10   [facility], Dr. 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 pylori  . ESOPHAGOGASTRODUODENOSCOPY  02/03/2006   Dr. Ileene Rubens White-> hiatal hernia, atrial erosions  . ESOPHAGOGASTRODUODENOSCOPY  01/21/2012   [id number]  lesion at arytenoid cartilage on the right-likely explains some of his oro- pharyngeal symptoms/Hiatal hernia/Schatzki's ring s/p dilation, gastric erosions without H.pylori  . ESOPHAGOGASTRODUODENOSCOPY (EGD) WITH PROPOFOL N/A 01/25/2014   Dr. Gala Romney- abnormal distal esophagus s/p passage of maloney dilator, hiatal hernia, stomach bx= mild chronic inflammation, esophagus bx= benign squamous mucosa  . LARYNX SURGERY     cyst removed, ENT [place]  . LARYNX SURGERY    . Lens placement in eye    . LIVER BIOPSY  11/20/2011   benign  . TONSILLECTOMY    . VENA CAVA FILTER PLACEMENT         Home Medications    Prior to Admission medications   Medication Sig Start Date End Date Taking? Authorizing Provider  amitriptyline (ELAVIL) 100 MG tablet Take 0.5 tablets (50 mg total) by mouth at bedtime. Patient taking differently: Take 100 mg by mouth at bedtime.  12/27/14  Yes Lezlie Octave Black, NP  amLODipine (NORVASC) 5 MG tablet Take 5 mg by mouth daily.  03/17/16  Yes Historical Provider, MD  aspirin 81 MG tablet Take 81 mg by mouth daily.   Yes Historical Provider, MD  Cholecalciferol (VITAMIN D3) 50000 UNITS CAPS Take 1 capsule by mouth once a week. Takes on Sunday 11/13/14  Yes Historical Provider, MD  diazepam (VALIUM) 5 MG tablet Take 1 tablet by mouth every 6 (six) hours as needed for anxiety.  03/13/14  Yes Historical Provider, MD  diphenhydrAMINE (BENADRYL) 25 MG tablet Take 25 mg by mouth 2 (two) times daily.   Yes Historical Provider, MD  DULoxetine (CYMBALTA) 60 MG capsule Take 60 mg by mouth daily.    Yes Historical Provider, MD  ketoconazole (NIZORAL) 2 % cream Apply 1 application topically daily as needed for irritation.  03/30/16  Yes Historical Provider, MD  metFORMIN (GLUCOPHAGE) 500 MG tablet Take 500 mg by mouth 2 (two) times daily with a meal.  01/29/13  Yes Kathie Dike, MD  MILK THISTLE PO Take 1,000 mg by mouth every evening.    Yes Historical Provider, MD  mometasone (ELOCON) 0.1 %  cream APPLY EXTERNALLY ONCE DAILY FOR  ITCHING 03/05/16  Yes Historical Provider, MD  Multiple Vitamin (MULTIVITAMIN) capsule Take 1 capsule by mouth daily.   Yes Historical Provider, MD  Omega-3 Fatty Acids (FISH OIL) 1000 MG CAPS Take 1 capsule by mouth daily.   Yes Historical Provider, MD  ondansetron (ZOFRAN) 8 MG tablet TAKE 1 TABLET TWICE DAILY AS NEEDED FOR NAUSEA OR VOMITING 01/03/16  Yes Annitta Needs, NP  oxyCODONE-acetaminophen (PERCOCET/ROXICET) 5-325 MG tablet Take 1 tablet by mouth 2 (two) times daily as needed for severe pain.  02/20/16  Yes Historical Provider, MD  pantoprazole (PROTONIX) 40 MG tablet Take 1 tablet (40 mg total) by mouth 2 (two) times daily. 12/27/14  Yes Radene Gunning, NP  Probiotic Product (PROBIOTIC DAILY PO) Take 1 tablet by mouth daily.    Yes Historical Provider, MD  rizatriptan (MAXALT-MLT) 10 MG disintegrating tablet Take one tablet by mouth every 2 hours as needed for migraine  pain. Not to excedd 2 tablets within a 24 hour period 03/05/16  Yes Historical Provider, MD  simvastatin (ZOCOR) 80 MG tablet Take 80 mg by mouth every evening.  12/28/13  Yes Historical Provider, MD  sucralfate (CARAFATE) 1 G tablet Take 1 g by mouth 4 (four) times daily. 1.5 pill at bed time   Yes Historical Provider, MD  topiramate (TOPAMAX) 100 MG tablet Take 1 tab every morning and 1.5 tabs every evening. Patient taking differently: Take 100-150 mg by mouth 2 (two) times daily. Take one tablet in the morning and one and one half tablet in the evening 12/27/14  Yes Lezlie Octave Black, NP  valsartan (DIOVAN) 160 MG tablet Take 160 mg by mouth daily.  03/17/16  Yes Historical Provider, MD  vitamin B-12 (CYANOCOBALAMIN) 1000 MCG tablet Take 1,000 mcg by mouth daily.    Yes Historical Provider, MD  vitamin E 400 UNIT capsule Take 400 Units by mouth daily.    Yes Historical Provider, MD  ciprofloxacin (CIPRO) 500 MG tablet Take 1 tablet (500 mg total) by mouth 2 (two) times daily. Patient not taking:  Reported on 04/20/2016 03/17/16   Daneil Dolin, MD  metroNIDAZOLE (FLAGYL) 500 MG tablet Take 1 tablet (500 mg total) by mouth 3 (three) times daily. Patient not taking: Reported on 04/20/2016 03/17/16   Daneil Dolin, MD    Family History Family History  Problem Relation Age of Onset  . Cancer Father 2    Gallbladder  . Diabetes Mother   . GER disease Mother   . Anesthesia problems Neg Hx   . Hypotension Neg Hx   . Malignant hyperthermia Neg Hx   . Pseudochol deficiency Neg Hx   . Colon cancer Neg Hx     Social History Social History  Substance Use Topics  . Smoking status: Current Every Day Smoker    Packs/day: 1.00    Years: 42.00    Types: Cigarettes  . Smokeless tobacco: Former Systems developer    Types: Chew     Comment: one pack a day  . Alcohol use 0.0 oz/week     Comment: occasionally; about once a month     Allergies   Ketorolac tromethamine; Sulfamethoxazole; and Tetracyclines & related   Review of Systems Review of Systems  Constitutional: Negative for appetite change and fatigue.  HENT: Negative for congestion, ear discharge and sinus pressure.   Eyes: Negative for discharge.  Respiratory: Negative for cough.   Cardiovascular: Negative for chest pain.  Gastrointestinal: Positive for abdominal pain. Negative for anorexia and diarrhea.  Genitourinary: Negative for frequency and hematuria.  Musculoskeletal: Negative for back pain.  Skin: Negative for rash.  Neurological: Negative for seizures and headaches.  Psychiatric/Behavioral: Negative for hallucinations.     Physical Exam Updated Vital Signs BP 105/57 (BP Location: Right Arm)   Pulse 69   Temp 98.4 F (36.9 C) (Oral)   Resp 20   Ht 6' (1.829 m)   Wt 217 lb (98.4 kg)   SpO2 93%   BMI 29.43 kg/m   Physical Exam  Constitutional: He is oriented to person, place, and time. He appears well-developed.  HENT:  Head: Normocephalic.  Eyes: Conjunctivae and EOM are normal. No scleral icterus.  Neck:  Neck supple. No thyromegaly present.  Cardiovascular: Normal rate and regular rhythm.  Exam reveals no gallop and no friction rub.   No murmur heard. Pulmonary/Chest: No stridor. He has no wheezes. He has no rales. He exhibits no tenderness.  Abdominal: He exhibits no distension. There is tenderness. There is no rebound.  Tender suprapubic and left lower quadrant  Musculoskeletal: Normal range of motion. He exhibits no edema.  Lymphadenopathy:    He has no cervical adenopathy.  Neurological: He is oriented to person, place, and time. He exhibits normal muscle tone. Coordination normal.  Skin: No rash noted. No erythema.  Psychiatric: He has a normal mood and affect. His behavior is normal.     ED Treatments / Results  Labs (all labs ordered are listed, but only abnormal results are displayed) Labs Reviewed  LIPASE, BLOOD - Abnormal; Notable for the following:       Result Value   Lipase 58 (*)    All other components within normal limits  COMPREHENSIVE METABOLIC PANEL - Abnormal; Notable for the following:    Creatinine, Ser 1.37 (*)    GFR calc non Af Amer 55 (*)    Anion gap 4 (*)    All other components within normal limits  URINALYSIS, ROUTINE W REFLEX MICROSCOPIC (NOT AT [facility]) - Abnormal; Notable for the following:    Specific Gravity, Urine <1.005 (*)    All other components within normal limits  CBC WITH DIFFERENTIAL/PLATELET - Abnormal; Notable for the following:    Monocytes Absolute 1.2 (*)    All other components within normal limits  CBC    EKG  EKG Interpretation None       Radiology Ct Abdomen Pelvis W Contrast  Result Date: 04/20/2016 CLINICAL DATA:  Right lower quadrant abdominal pain for the past 2-3 days. Recent diverticulitis. EXAM: CT ABDOMEN AND PELVIS WITH CONTRAST TECHNIQUE: Multidetector CT imaging of the abdomen and pelvis was performed using the standard protocol following bolus administration of intravenous contrast. CONTRAST:  190mL  ISOVUE-300 IOPAMIDOL (ISOVUE-300) INJECTION 61% COMPARISON:  03/13/2015. FINDINGS: Lower chest: Stable mild atelectasis or scarring at both posterior lung bases. Hepatobiliary: Cholecystectomy clips. Unremarkable non contrasted appearance of the liver. Pancreas: No mass, inflammatory changes, or other significant abnormality. Spleen: Within normal limits in size and appearance. Adrenals/Urinary Tract: Normal appearing adrenal glands. Small bilateral renal cysts. Cortical scar with minimal calcification in the lower pole of the left kidney. No urinary tract calculi or hydronephrosis. Stomach/Bowel: Mildly prominent stool throughout the colon. Unremarkable stomach and small bowel. Normal appearing appendix. Vascular/Lymphatic: Atheromatous arterial calcifications, including the abdominal aorta. No enlarged lymph nodes. Reproductive: Mildly enlarged prostate gland containing coarse calcifications. Other: Tiny umbilical hernia containing fat. Musculoskeletal: Lumbar and lower thoracic spine degenerative changes. IMPRESSION: No acute abnormality.  Mildly prominent stool in the colon. Electronically Signed   By: Claudie Revering M.D.   On: 04/20/2016 17:02    Procedures Procedures (including critical care time)  Medications Ordered in ED Medications  diatrizoate meglumine-sodium (GASTROGRAFIN) 66-10 % solution (not administered)  sodium chloride 0.9 % bolus 1,000 mL (0 mLs Intravenous Stopped 04/20/16 1637)  HYDROmorphone (DILAUDID) injection 1 mg (1 mg Intravenous Given 04/20/16 1539)  ondansetron (ZOFRAN) injection 4 mg (4 mg Intravenous Given 04/20/16 1539)  LORazepam (ATIVAN) injection 0.5 mg (0.5 mg Intravenous Given 04/20/16 1540)  iopamidol (ISOVUE-300) 61 % injection 100 mL (100 mLs Intravenous Contrast Given 04/20/16 1647)  HYDROmorphone (DILAUDID) injection 1 mg (1 mg Intravenous Given 04/20/16 1806)     Initial Impression / Assessment and Plan / ED Course  I have reviewed the triage vital signs and the  nursing notes.  Pertinent labs & imaging results that were available during my care of the patient were reviewed by  me and considered in my medical decision making (see chart for details).  Clinical Course    Labs and CT scan unremarkable except for mild elevation of lipase. Patient was told to increase his Percocet to 4 times a day follow-up with his GI doctor or family practice doctor. Discomfort could be related to his irritable bowel. Patient was also referred to urology because of difficulty urinating. Patient had a prostate exam earlier today but no physician and sinus prostate was not enlarged  Final Clinical Impressions(s) / ED Diagnoses   Final diagnoses:  Abdominal pain in male    New Prescriptions New Prescriptions   No medications on file     Milton Ferguson, MD 04/20/16 2027

## 2016-04-20 NOTE — ED Notes (Signed)
Pt reports he last urinated today at 0700.

## 2016-04-20 NOTE — ED Notes (Signed)
Pt finished drinking oral contrast. CT notified.  

## 2016-04-20 NOTE — ED Notes (Signed)
Pt continuously asking multiple staff members for something to drink, even after explaining to pt that the results of the CT scan need to come back.

## 2016-04-20 NOTE — ED Triage Notes (Signed)
Patient sent from [facility] for abdominal pain. States he was recently on medication for diverticulitis but stopped the medication last week due to vomiting. States when he stopped the medication, he stopped vomiting. States lower abdominal pain has worsened x 1 week.

## 2016-05-15 ENCOUNTER — Other Ambulatory Visit (HOSPITAL_COMMUNITY)
Admission: AD | Admit: 2016-05-15 | Discharge: 2016-05-15 | Disposition: A | Discharge status: Home / Self Care | Payer: Medicare HMO | Source: Other Acute Inpatient Hospital | Attending: Urology | Admitting: Urology

## 2016-05-15 ENCOUNTER — Ambulatory Visit (INDEPENDENT_AMBULATORY_CARE_PROVIDER_SITE_OTHER): Payer: Medicare HMO | Admitting: Urology

## 2016-05-15 DIAGNOSIS — R3914 Feeling of incomplete bladder emptying: Secondary | ICD-10-CM | POA: Diagnosis present

## 2016-05-15 DIAGNOSIS — R350 Frequency of micturition: Secondary | ICD-10-CM | POA: Diagnosis not present

## 2016-05-15 DIAGNOSIS — N401 Enlarged prostate with lower urinary tract symptoms: Secondary | ICD-10-CM | POA: Diagnosis not present

## 2016-05-15 DIAGNOSIS — R300 Dysuria: Secondary | ICD-10-CM | POA: Diagnosis not present

## 2016-05-15 LAB — URINE MICROSCOPIC-ADD ON

## 2016-05-15 LAB — URINALYSIS, ROUTINE W REFLEX MICROSCOPIC
Bilirubin Urine: NEGATIVE
Glucose, UA: NEGATIVE mg/dL
Ketones, ur: NEGATIVE mg/dL
Nitrite: NEGATIVE
Protein, ur: NEGATIVE mg/dL
Specific Gravity, Urine: 1.01 (ref 1.005–1.030)
pH: 7 (ref 5.0–8.0)

## 2016-05-18 LAB — URINE CULTURE: Culture: >=100000 — AB

## 2016-06-19 ENCOUNTER — Ambulatory Visit (INDEPENDENT_AMBULATORY_CARE_PROVIDER_SITE_OTHER): Payer: Medicare HMO | Admitting: Urology

## 2016-06-19 DIAGNOSIS — R3911 Hesitancy of micturition: Secondary | ICD-10-CM

## 2016-06-19 DIAGNOSIS — N401 Enlarged prostate with lower urinary tract symptoms: Secondary | ICD-10-CM | POA: Diagnosis not present

## 2016-06-19 DIAGNOSIS — R3914 Feeling of incomplete bladder emptying: Secondary | ICD-10-CM

## 2016-06-19 DIAGNOSIS — R350 Frequency of micturition: Secondary | ICD-10-CM | POA: Diagnosis not present

## 2016-06-19 DIAGNOSIS — R3912 Poor urinary stream: Secondary | ICD-10-CM

## 2016-09-09 ENCOUNTER — Ambulatory Visit (INDEPENDENT_AMBULATORY_CARE_PROVIDER_SITE_OTHER): Payer: Medicare HMO | Admitting: Urology

## 2016-09-09 DIAGNOSIS — R102 Pelvic and perineal pain: Secondary | ICD-10-CM

## 2016-09-09 DIAGNOSIS — N401 Enlarged prostate with lower urinary tract symptoms: Secondary | ICD-10-CM

## 2016-09-21 ENCOUNTER — Other Ambulatory Visit: Payer: Self-pay | Admitting: Gastroenterology

## 2016-11-25 ENCOUNTER — Ambulatory Visit: Payer: Medicare HMO | Admitting: Urology

## 2017-02-01 ENCOUNTER — Emergency Department (HOSPITAL_COMMUNITY)
Admission: EM | Admit: 2017-02-01 | Discharge: 2017-02-01 | Disposition: A | Discharge status: Home / Self Care | Payer: Medicare HMO | Attending: Emergency Medicine | Admitting: Emergency Medicine

## 2017-02-01 ENCOUNTER — Ambulatory Visit: Payer: Medicare HMO | Admitting: Gastroenterology

## 2017-02-01 ENCOUNTER — Encounter (HOSPITAL_COMMUNITY): Payer: Self-pay

## 2017-02-01 ENCOUNTER — Emergency Department (HOSPITAL_COMMUNITY): Payer: Medicare HMO

## 2017-02-01 DIAGNOSIS — Y999 Unspecified external cause status: Secondary | ICD-10-CM | POA: Diagnosis not present

## 2017-02-01 DIAGNOSIS — W57XXXA Bitten or stung by nonvenomous insect and other nonvenomous arthropods, initial encounter: Secondary | ICD-10-CM | POA: Insufficient documentation

## 2017-02-01 DIAGNOSIS — T7840XA Allergy, unspecified, initial encounter: Secondary | ICD-10-CM

## 2017-02-01 DIAGNOSIS — Z7984 Long term (current) use of oral hypoglycemic drugs: Secondary | ICD-10-CM | POA: Diagnosis not present

## 2017-02-01 DIAGNOSIS — I10 Essential (primary) hypertension: Secondary | ICD-10-CM | POA: Diagnosis not present

## 2017-02-01 DIAGNOSIS — Y929 Unspecified place or not applicable: Secondary | ICD-10-CM | POA: Diagnosis not present

## 2017-02-01 DIAGNOSIS — Y939 Activity, unspecified: Secondary | ICD-10-CM | POA: Insufficient documentation

## 2017-02-01 DIAGNOSIS — E119 Type 2 diabetes mellitus without complications: Secondary | ICD-10-CM | POA: Diagnosis not present

## 2017-02-01 DIAGNOSIS — Z79899 Other long term (current) drug therapy: Secondary | ICD-10-CM | POA: Diagnosis not present

## 2017-02-01 DIAGNOSIS — Z7982 Long term (current) use of aspirin: Secondary | ICD-10-CM | POA: Insufficient documentation

## 2017-02-01 DIAGNOSIS — S1096XA Insect bite of unspecified part of neck, initial encounter: Secondary | ICD-10-CM | POA: Diagnosis not present

## 2017-02-01 DIAGNOSIS — F17210 Nicotine dependence, cigarettes, uncomplicated: Secondary | ICD-10-CM | POA: Insufficient documentation

## 2017-02-01 LAB — RAPID STREP SCREEN (MED CTR MEBANE ONLY): Streptococcus, Group A Screen (Direct): NEGATIVE

## 2017-02-01 MED ORDER — DOXYCYCLINE HYCLATE 100 MG PO TABS: 100.0000 mg | ORAL_TABLET | Freq: Once | ORAL | Status: DC

## 2017-02-01 MED ORDER — DIPHENHYDRAMINE HCL 25 MG PO CAPS
25.0000 mg | ORAL_CAPSULE | Freq: Once | ORAL | Status: DC
  Filled 2017-02-01: qty 1

## 2017-02-01 MED ORDER — DOXYCYCLINE HYCLATE 100 MG PO TABS
100.0000 mg | ORAL_TABLET | Freq: Once | ORAL | Status: DC
  Filled 2017-02-01: qty 1

## 2017-02-01 NOTE — ED Provider Notes (Signed)
[facility] DEPT Provider Note   CSN: [id number] Arrival date & time: 02/01/17  2703     History   Chief Complaint Chief Complaint  Patient presents with  . Allergic Reaction    HPI Brian [name] is a 61 y.o. male.  The history is provided by the patient.  Allergic Reaction  Presenting symptoms: no wheezing     Past Medical History:  Diagnosis Date  . Adenomatous colon polyp       . Anxiety   . Chronic abdominal pain 08/11/11   Reported per patient, Dr. Fabio Neighbors in [place]  . Chronic diarrhea   . Depression   . Diverticulosis   . DM (diabetes mellitus) ([place])   . Gastroparesis   . GERD (gastroesophageal reflux disease)   . H. pylori infection 2003   pt lived in [place], treated with prevpac.  . H/O Clostridium difficile infection 12/2012  . Headache(784.0)    migraines  . High cholesterol   . HOH (hard of hearing)   . HTN (hypertension)   . IBS (irritable bowel syndrome)   . LVH (left ventricular hypertrophy)   . Neuropathic pain of left forearm   . Obesity   . Polypharmacy 12/26/2014  . Seizure disorder ([place])    1 seizure with severe back pain. no further meds or seizures  . Seizures ([place])   . Shortness of breath   . Sleep apnea    not using cpap  . Syncope and collapse 12/25/2014   Thought be secondary to seizure.    Patient Active Problem List   Diagnosis Date Noted  . Nausea without vomiting 09/04/2015  . Constipation 06/05/2015  . Abdominal pain, left lower quadrant 03/13/2015  . Dark stools 03/13/2015  . LVH (left ventricular hypertrophy) 12/27/2014  . Seizure disorder ([place]) 12/27/2014  . Neuropathic pain of left forearm 12/26/2014  . Polypharmacy 12/26/2014  . Laceration of occipital scalp 12/26/2014  . Syncope and collapse 12/25/2014  . Obesity 12/25/2014  . Abnormal LFTs 04/25/2014  . Unspecified constipation 04/25/2014  . Tobacco abuse 01/28/2013  . Diabetes type 2, controlled ([place]) 01/28/2013  . Dysphagia 12/13/2012  .  Gastroparesis 12/13/2012  . GERD (gastroesophageal reflux disease) 12/15/2011  . Chronic abdominal pain 10/28/2011  . Gallbladder polyp 10/28/2011  . Fatty liver 10/28/2011    Past Surgical History:  Procedure Laterality Date  . arm surgery     tendon/left  . CHOLECYSTECTOMY  11/20/2011   Procedure: LAPAROSCOPIC CHOLECYSTECTOMY;  Surgeon: Donato Heinz, MD;  Location: [facility];  Service: General;  Laterality: N/A;  . COLONOSCOPY  2012   Dr. Rhunette Croft, [place].pt gives history of adenomatous polyps and says he is due for repeat colonoscopy in 3 years.   . COLONOSCOPY WITH PROPOFOL N/A 04/13/2013   [id number] diverticulosis. Single colonic polyp, hyperplastic. Surveillance 2019.   Marland Kitchen ESOPHAGEAL DILATION N/A 01/25/2014   Procedure: ESOPHAGEAL DILATION;  Surgeon: Daneil Dolin, MD;  Location: [facility];  Service: Endoscopy;  Laterality: N/A;  Malony 56 french, no heme noted after dilation  . ESOPHAGOGASTRODUODENOSCOPY  09/02/10   [facility], Dr. 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 pylori  . ESOPHAGOGASTRODUODENOSCOPY  02/03/2006   Dr. Ileene Rubens White-> hiatal hernia, atrial erosions  . ESOPHAGOGASTRODUODENOSCOPY  01/21/2012   HBZ:JIRCVE lesion at arytenoid cartilage on the right-likely explains some of his oro- pharyngeal  symptoms/Hiatal hernia/Schatzki's ring s/p dilation, gastric erosions without H.pylori  . ESOPHAGOGASTRODUODENOSCOPY (EGD) WITH PROPOFOL N/A 01/25/2014   Dr. Gala Romney- abnormal distal esophagus s/p passage of maloney dilator, hiatal hernia, stomach bx= mild chronic inflammation, esophagus bx= benign squamous mucosa  . LARYNX SURGERY     cyst removed, ENT [place]  . LARYNX SURGERY    . Lens placement in eye    . LIVER BIOPSY  11/20/2011   benign  . TONSILLECTOMY    . VENA  CAVA FILTER PLACEMENT         Home Medications    Prior to Admission medications   Medication Sig Start Date End Date Taking? Authorizing Provider  amitriptyline (ELAVIL) 100 MG tablet Take 0.5 tablets (50 mg total) by mouth at bedtime. Patient taking differently: Take 100 mg by mouth at bedtime.  12/27/14   Black, Lezlie Octave, NP  amLODipine (NORVASC) 5 MG tablet Take 5 mg by mouth daily.  03/17/16   [provider]  aspirin 81 MG tablet Take 81 mg by mouth daily.    [provider]  Cholecalciferol (VITAMIN D3) 50000 UNITS CAPS Take 1 capsule by mouth once a week. Takes on Sunday 11/13/14   [provider]  ciprofloxacin (CIPRO) 500 MG tablet Take 1 tablet (500 mg total) by mouth 2 (two) times daily. Patient not taking: Reported on 04/20/2016 03/17/16   Daneil Dolin, MD  diazepam (VALIUM) 5 MG tablet Take 1 tablet by mouth every 6 (six) hours as needed for anxiety.  03/13/14   [provider]  diphenhydrAMINE (BENADRYL) 25 MG tablet Take 25 mg by mouth 2 (two) times daily.    [provider]  DULoxetine (CYMBALTA) 60 MG capsule Take 60 mg by mouth daily.     [provider]  ketoconazole (NIZORAL) 2 % cream Apply 1 application topically daily as needed for irritation.  03/30/16   [provider]  metFORMIN (GLUCOPHAGE) 500 MG tablet Take 500 mg by mouth 2 (two) times daily with a meal.  01/29/13   Kathie Dike, MD  metroNIDAZOLE (FLAGYL) 500 MG tablet Take 1 tablet (500 mg total) by mouth 3 (three) times daily. Patient not taking: Reported on 04/20/2016 03/17/16   Daneil Dolin, MD  MILK THISTLE PO Take 1,000 mg by mouth every evening.     [provider]  mometasone (ELOCON) 0.1 % cream APPLY EXTERNALLY ONCE DAILY FOR  ITCHING 03/05/16   [provider]  Multiple Vitamin (MULTIVITAMIN) capsule Take 1 capsule by mouth daily.    [provider]  Omega-3 Fatty Acids (FISH OIL) 1000 MG CAPS Take 1 capsule by  mouth daily.    [provider]  ondansetron (ZOFRAN) 8 MG tablet TAKE 1 TABLET TWICE DAILY AS NEEDED FOR NAUSEA OR VOMITING 09/23/16   Carlis Stable, NP  oxyCODONE-acetaminophen (PERCOCET/ROXICET) 5-325 MG tablet Take 1 tablet by mouth 2 (two) times daily as needed for severe pain.  02/20/16   [provider]  pantoprazole (PROTONIX) 40 MG tablet Take 1 tablet (40 mg total) by mouth 2 (two) times daily. 12/27/14   Radene Gunning, NP  Probiotic Product (PROBIOTIC DAILY PO) Take 1 tablet by mouth daily.     [provider]  rizatriptan (MAXALT-MLT) 10 MG disintegrating tablet Take one tablet by mouth every 2 hours as needed for migraine pain. Not to excedd 2 tablets within a 24 hour period 03/05/16   [provider]  simvastatin (ZOCOR) 80 MG tablet Take 80  mg by mouth every evening.  12/28/13   [provider]  sucralfate (CARAFATE) 1 G tablet Take 1 g by mouth 4 (four) times daily. 1.5 pill at bed time    [provider]  topiramate (TOPAMAX) 100 MG tablet Take 1 tab every morning and 1.5 tabs every evening. Patient taking differently: Take 100-150 mg by mouth 2 (two) times daily. Take one tablet in the morning and one and one half tablet in the evening 12/27/14   Black, Lezlie Octave, NP  valsartan (DIOVAN) 160 MG tablet Take 160 mg by mouth daily.  03/17/16   [provider]  vitamin B-12 (CYANOCOBALAMIN) 1000 MCG tablet Take 1,000 mcg by mouth daily.     [provider]  vitamin E 400 UNIT capsule Take 400 Units by mouth daily.     [provider]    Family History Family History  Problem Relation Age of Onset  . Cancer Father 55       Gallbladder  . Diabetes Mother   . GER disease Mother   . Anesthesia problems Neg Hx   . Hypotension Neg Hx   . Malignant hyperthermia Neg Hx   . Pseudochol deficiency Neg Hx   . Colon cancer Neg Hx     Social History Social History  Substance Use Topics  . Smoking status: Current  Every Day Smoker    Packs/day: 1.00    Years: 42.00    Types: Cigarettes  . Smokeless tobacco: Former Systems developer    Types: Chew     Comment: one pack a day  . Alcohol use 0.0 oz/week     Comment: occasionally; about once a month     Allergies   Doxycycline; Ketorolac tromethamine; Sulfamethoxazole; and Tetracyclines & related   Review of Systems Review of Systems  Constitutional: Negative for activity change.       All ROS Neg except as noted in HPI  HENT: Negative for nosebleeds.   Eyes: Negative for photophobia and discharge.  Respiratory: Negative for cough, shortness of breath and wheezing.   Cardiovascular: Negative for chest pain and palpitations.  Gastrointestinal: Negative for abdominal pain and blood in stool.  Genitourinary: Negative for dysuria, frequency and hematuria.  Musculoskeletal: Negative for arthralgias, back pain and neck pain.       Swelling of hand and feet.  Skin: Negative.        itching  Neurological: Negative for dizziness, seizures and speech difficulty.  Psychiatric/Behavioral: Negative for confusion and hallucinations.     Physical Exam Updated Vital Signs BP 130/77   Pulse 74   Temp 98.7 F (37.1 C) (Oral)   Resp 18   Ht 6' (1.829 m)   Wt 98.4 kg (217 lb)   SpO2 98%   BMI 29.43 kg/m   Physical Exam  Constitutional: He is oriented to person, place, and time. He appears well-developed and well-nourished.  Non-toxic appearance.  HENT:  Head: Normocephalic.  Right Ear: Tympanic membrane and external ear normal.  Left Ear: Tympanic membrane and external ear normal.  There is no swelling of the mouth or posterior pharynx or tongue. The airway is patent. There is an area of increased redness of the posterior pharynx just above the uvula.  Eyes: EOM and lids are normal. Pupils are equal, round, and reactive to light.  Neck: Normal range of motion. Neck supple. Carotid bruit is not present. No tracheal deviation present.  Cardiovascular: Normal  rate, regular rhythm, normal heart sounds, intact distal pulses and  normal pulses.   Pulmonary/Chest: Breath sounds normal. No stridor. No respiratory distress. He has no wheezes. He has no rales.  Symmetrical rise and fall of the chest. Patient speaks in complete sentences without problem.  Abdominal: Soft. Bowel sounds are normal. There is no tenderness. There is no guarding.  Musculoskeletal: Normal range of motion.  Lymphadenopathy:       Head (right side): No submandibular adenopathy present.       Head (left side): No submandibular adenopathy present.    He has no cervical adenopathy.  Neurological: He is alert and oriented to person, place, and time. He has normal strength. No cranial nerve deficit or sensory deficit.  Skin: Skin is warm and dry. Rash noted.  Psychiatric: He has a normal mood and affect. His speech is normal.  Nursing note and vitals reviewed.    ED Treatments / Results  Labs (all labs ordered are listed, but only abnormal results are displayed) Labs Reviewed  RAPID STREP SCREEN (NOT AT [facility])  CULTURE, GROUP A STREP ([place])  ROCKY MTN SPOTTED FVR ABS PNL(IGG+IGM)    EKG  EKG Interpretation None       Radiology Dg Neck Soft Tissue  Result Date: 02/01/2017 CLINICAL DATA:  Difficulty swallowing. Possible allergic reaction to Percocet. EXAM: NECK SOFT TISSUES - 1+ VIEW COMPARISON:  None. FINDINGS: There is no evidence of retropharyngeal soft tissue swelling or epiglottic enlargement. The aryepiglottic folds appear sharp. Vallecular density attributed to lingual tonsil. The cervical airway is patent and no radio-opaque foreign body identified. Cervical disc degeneration most notable at C5-6 and C6-7. IMPRESSION: Patent cervical airway without visualized swelling. Electronically Signed   By: Monte Fantasia M.D.   On: 02/01/2017 12:45    Procedures Procedures (including critical care time)  Medications Ordered in ED Medications  doxycycline (VIBRA-TABS)  tablet 100 mg (not administered)  diphenhydrAMINE (BENADRYL) capsule 25 mg (not administered)     Initial Impression / Assessment and Plan / ED Course  I have reviewed the triage vital signs and the nursing notes.  Pertinent labs & imaging results that were available during my care of the patient were reviewed by me and considered in my medical decision making (see chart for details).       Final Clinical Impressions(s) / ED Diagnoses MDM Vital signs within normal limits. Pulse oximetry is 98% on room air. Patient is in no distress at this time. Patient reports some difficulty with swallowing. Will obtain strep test and will obtain soft tissue neck x-rays.  Strep test is negative. Soft tissue neck films are negative. Patient is speaking in complete sentences without any problem. We'll ask the patient to refrain from using Percocet until he can discuss this with his primary physician.  Patient sustained 2 tick bites recently. He has not had any recent fever or chills to be reported. He has had some itching all over but he thinks this was probably related to the Percocet or something else. I have obtained a [facility] spotted fever titer. I offered to treat the patient prophylactically with doxycycline. The patient states that he has allergies and side effects to doxycycline and tetracycline related medications. At this point I suggested to the patient that we take a wait and see approach. At that we reserve the doxycycline only if he starts having symptoms. Patient understands discharge instructions. He will follow-up with his primary physician or return to the emergency department if any changes.    Final diagnoses:  None  New Prescriptions New Prescriptions   No medications on file     Lily Kocher, Hershal Coria 02/01/17 1424    Milton Ferguson, MD 02/01/17 1553

## 2017-02-01 NOTE — ED Triage Notes (Signed)
Pt reports has been on percocet for 6 months for chronic abd pain.  Reports Saturday after taking percocet, he started itching all over and swelling in hands and feet.  Pt also says was bit by a tick recently on the r side of his neck and the area is swollen.  Pt says he took benadryl Saturday and the symptoms subsided except he says he feels like he is having a little difficulty swallowing.  Pt does not appear to be in any respiratory distress, airway patent, managing secretions.

## 2017-02-01 NOTE — ED Notes (Signed)
cbg 135 per pt at home at 0830 this morning.

## 2017-02-01 NOTE — Discharge Instructions (Signed)
Your oxygen level is 98% on room air. Your vital signs are normal. Your airway is open. X-ray of the soft tissues of your neck down to your lung area are negative for swelling or signs of airway obstruction or other acute or emergent situation. Your strep test is negative.  Please stay away from the Percocet tablets for now. Please discuss this with your ordering physician so that alternative methods of assistance with your pain can be accomplished.  The tick bite areas appear to be healing nicely. Your vital signs do not reveal evidence of a high fever. You have indicated an allergy to doxycycline and medicines in the tetracycline family. There no reported symptoms related to tick borne illness at this time. Please notify your primary physician if you do develop any symptoms. At that time decision will need to be made on the proper treatment. Please return to the emergency department if any emergent changes, problems, or concerns.

## 2017-02-03 LAB — RMSF, IGG, IFA: RMSF, IGG, IFA: 1:256 {titer} — ABNORMAL HIGH

## 2017-02-03 LAB — ROCKY MTN SPOTTED FVR ABS PNL(IGG+IGM)
RMSF IgG: POSITIVE — AB
RMSF IgM: 0.22 index (ref 0.00–0.89)

## 2017-02-04 LAB — CULTURE, GROUP A STREP (THRC)

## 2017-05-18 ENCOUNTER — Telehealth: Payer: Self-pay

## 2017-05-18 ENCOUNTER — Encounter: Payer: Self-pay | Admitting: Internal Medicine

## 2017-05-18 ENCOUNTER — Other Ambulatory Visit: Payer: Self-pay

## 2017-05-18 ENCOUNTER — Ambulatory Visit (INDEPENDENT_AMBULATORY_CARE_PROVIDER_SITE_OTHER): Payer: Medicare HMO | Admitting: Internal Medicine

## 2017-05-18 VITALS — BP 106/64 | HR 86 | Temp 98.3°F | Ht 72.0 in | Wt 224.0 lb

## 2017-05-18 DIAGNOSIS — K5900 Constipation, unspecified: Secondary | ICD-10-CM

## 2017-05-18 DIAGNOSIS — Z86010 Personal history of colonic polyps: Secondary | ICD-10-CM

## 2017-05-18 DIAGNOSIS — R1032 Left lower quadrant pain: Secondary | ICD-10-CM | POA: Diagnosis not present

## 2017-05-18 DIAGNOSIS — K5903 Drug induced constipation: Secondary | ICD-10-CM | POA: Diagnosis not present

## 2017-05-18 MED ORDER — PEG 3350-KCL-NA BICARB-NACL 420 G PO SOLR: 4000.0000 mL | ORAL | 0 refills | Status: DC

## 2017-05-18 NOTE — Telephone Encounter (Signed)
Called and informed pt of pre-op appt 06/09/17 at 11:00am. Letter mailed.

## 2017-05-18 NOTE — Progress Notes (Signed)
Primary Care Physician:  Vidal Schwalbe, MD Primary Gastroenterologist:  Dr. Gala Romney  Pre-Procedure History & Physical: HPI:  Brian [name] is a 61 y.o. male here for further evaluation of a left lower quadrant abdominal pain and assess need for signs colonoscopy. History of colonic adenoma. Last colonoscopy over 4 years ago. Hyperplastic polyp removed at that time; prior adenoma. Patient has had a year or so history of left lower quadrant abdominal pain. Chronic constipation likely opioid induced. Takes a fiber supplement daily which does not make much of a difference. Has chronically strained. Doesn't really evacuate very  well. CT scan a year ago demonstrated a large stool burden without significant other findings. Hasn't had any rectal bleeding. Has tried various laxatives in the past including Linzess; Has not tried Movantik.  Past Medical History:  Diagnosis Date  . Adenomatous colon polyp       . Anxiety   . Chronic abdominal pain 08/11/11   Reported per patient, Dr. Fabio Neighbors in [place]  . Chronic diarrhea   . Depression   . Diverticulosis   . DM (diabetes mellitus) ([place])   . Gastroparesis   . GERD (gastroesophageal reflux disease)   . H. pylori infection 2003   pt lived in [place], treated with prevpac.  . H/O Clostridium difficile infection 12/2012  . Headache(784.0)    migraines  . High cholesterol   . HOH (hard of hearing)   . HTN (hypertension)   . IBS (irritable bowel syndrome)   . LVH (left ventricular hypertrophy)   . Neuropathic pain of left forearm   . Obesity   . Polypharmacy 12/26/2014  . Seizure disorder ([place])    1 seizure with severe back pain. no further meds or seizures  . Seizures ([place])   . Shortness of breath   . Sleep apnea    not using cpap  . Syncope and collapse 12/25/2014   Thought be secondary to seizure.    Past Surgical History:  Procedure Laterality Date  . arm surgery     tendon/left  . CHOLECYSTECTOMY  11/20/2011   Procedure:  LAPAROSCOPIC CHOLECYSTECTOMY;  Surgeon: Donato Heinz, MD;  Location: [facility];  Service: General;  Laterality: N/A;  . COLONOSCOPY  2012   Dr. Rhunette Croft, [place].pt gives history of adenomatous polyps and says he is due for repeat colonoscopy in 3 years.   . COLONOSCOPY WITH PROPOFOL N/A 04/13/2013   YKD:XIPJASN diverticulosis. Single colonic polyp, hyperplastic. Surveillance 2019.   Marland Kitchen ESOPHAGEAL DILATION N/A 01/25/2014   Procedure: ESOPHAGEAL DILATION;  Surgeon: Daneil Dolin, MD;  Location: [facility];  Service: Endoscopy;  Laterality: N/A;  Malony 56 french, no heme noted after dilation  . ESOPHAGOGASTRODUODENOSCOPY  09/02/10   [facility], Dr. 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 pylori  . ESOPHAGOGASTRODUODENOSCOPY  02/03/2006   Dr. Ileene Rubens White-> hiatal hernia, atrial erosions  . ESOPHAGOGASTRODUODENOSCOPY  01/21/2012   KNL:ZJQBHA lesion at arytenoid cartilage on the right-likely explains some of his oro- pharyngeal symptoms/Hiatal hernia/Schatzki's ring s/p dilation, gastric erosions without H.pylori  . ESOPHAGOGASTRODUODENOSCOPY (EGD) WITH PROPOFOL N/A 01/25/2014   Dr. Gala Romney- abnormal distal esophagus s/p passage of maloney dilator, hiatal hernia, stomach bx= mild chronic inflammation, esophagus bx= benign squamous mucosa  . LARYNX SURGERY     cyst removed, ENT [place]  . LARYNX SURGERY    .  Lens placement in eye    . LIVER BIOPSY  11/20/2011   benign  . TONSILLECTOMY    . VENA CAVA FILTER PLACEMENT      Prior to Admission medications   Medication Sig Start Date End Date Taking? Authorizing Provider  amitriptyline (ELAVIL) 100 MG tablet Take 0.5 tablets (50 mg total) by mouth at bedtime. Patient taking differently: Take 200 mg by mouth at bedtime.  12/27/14  Yes Black, Lezlie Octave, NP   amLODipine (NORVASC) 5 MG tablet Take 5 mg by mouth daily.  03/17/16  Yes [provider]  aspirin 81 MG tablet Take 81 mg by mouth daily.   Yes [provider]  busPIRone (BUSPAR) 15 MG tablet Take 15 mg by mouth 4 (four) times daily.   Yes [provider]  Cholecalciferol (VITAMIN D3) 50000 UNITS CAPS Take 1 capsule by mouth once a week. Takes on Sunday 11/13/14  Yes [provider]  citalopram (CELEXA) 20 MG tablet Take 20 mg by mouth daily.   Yes [provider]  diazepam (VALIUM) 5 MG tablet Take 10 mg by mouth every 6 (six) hours as needed for anxiety.  03/13/14  Yes [provider]  diphenhydrAMINE (BENADRYL) 25 MG tablet Take 25 mg by mouth 2 (two) times daily.   Yes [provider]  HYDROcodone-acetaminophen (NORCO) 10-325 MG tablet Take 1 tablet by mouth every 6 (six) hours as needed.   Yes [provider]  ketoconazole (NIZORAL) 2 % cream Apply 1 application topically daily as needed for irritation.  03/30/16  Yes [provider]  metFORMIN (GLUCOPHAGE) 500 MG tablet Take 500 mg by mouth 2 (two) times daily with a meal.  01/29/13  Yes Memon, Jolaine Artist, MD  MILK THISTLE PO Take 1,000 mg by mouth every evening.    Yes [provider]  mometasone (ELOCON) 0.1 % cream APPLY EXTERNALLY ONCE DAILY FOR  ITCHING 03/05/16  Yes [provider]  Multiple Vitamin (MULTIVITAMIN) capsule Take 1 capsule by mouth daily.   Yes [provider]  Omega-3 Fatty Acids (FISH OIL) 1000 MG CAPS Take 1 capsule by mouth daily.   Yes [provider]  ondansetron (ZOFRAN) 8 MG tablet TAKE 1 TABLET TWICE DAILY AS NEEDED FOR NAUSEA OR VOMITING 09/23/16  Yes Carlis Stable, NP  pantoprazole (PROTONIX) 40 MG tablet Take 1 tablet (40 mg total) by mouth 2 (two) times daily. 12/27/14  Yes Black, Lezlie Octave, NP  Probiotic Product (PROBIOTIC DAILY PO) Take 1 tablet by mouth daily.    Yes [provider]  rizatriptan  (MAXALT-MLT) 10 MG disintegrating tablet Take one tablet by mouth every 2 hours as needed for migraine pain. Not to excedd 2 tablets within a 24 hour period 03/05/16  Yes [provider]  simvastatin (ZOCOR) 80 MG tablet Take 80 mg by mouth every evening.  12/28/13  Yes [provider]  sucralfate (CARAFATE) 1 G tablet Take 1 g by mouth 4 (four) times daily. 1.5 pill at bed time   Yes [provider]  topiramate (TOPAMAX) 100 MG tablet Take 1 tab every morning and 1.5 tabs every evening. Patient taking differently: Take 100-150 mg by mouth 2 (two) times daily. Take one tablet in the morning and one and one half tablet in the evening 12/27/14  Yes Black, Lezlie Octave, NP  valsartan (DIOVAN) 160 MG tablet Take 160 mg by mouth daily.  03/17/16  Yes [provider]  vitamin B-12 (CYANOCOBALAMIN) 1000 MCG tablet Take  1,000 mcg by mouth daily.    Yes [provider]  vitamin E 400 UNIT capsule Take 400 Units by mouth daily.    Yes [provider]    Allergies as of 05/18/2017 - Review Complete 05/18/2017  Allergen Reaction Noted  . Doxycycline  02/01/2017  . Ketorolac tromethamine  10/27/2011  . Sulfamethoxazole Rash 10/27/2011  . Tetracyclines & related Rash 10/27/2011    Family History  Problem Relation Age of Onset  . Cancer Father 20       Gallbladder  . Diabetes Mother   . GER disease Mother   . Anesthesia problems Neg Hx   . Hypotension Neg Hx   . Malignant hyperthermia Neg Hx   . Pseudochol deficiency Neg Hx   . Colon cancer Neg Hx     Social History   Social History  . Marital status: Divorced    Spouse name: N/A  . Number of children: 1  . Years of education: N/A   Occupational History  . disabled    Social History Main Topics  . Smoking status: Current Every Day Smoker    Packs/day: 1.00    Years: 42.00    Types: Cigarettes  . Smokeless tobacco: Former Systems developer    Types: Chew     Comment: one pack a day  . Alcohol use 0.0  oz/week     Comment: occasionally; about once a month  . Drug use: Yes    Types: Marijuana     Comment: "every once in a while"  . Sexual activity: Not on file   Other Topics Concern  . Not on file   Social History Narrative   Divorced, moved to [facility] November 2012    Review of Systems: See HPI, otherwise negative ROS  Physical Exam: BP 106/64   Pulse 86   Temp 98.3 F (36.8 C) (Oral)   Ht 6' (1.829 m)   Wt 224 lb (101.6 kg)   BMI 30.38 kg/m  General:   Alert,  , well-nourished, pleasant and cooperative in NAD Neck:  Supple; no masses or thyromegaly. No significant cervical adenopathy. Lungs:  Clear throughout to auscultation.   No wheezes, crackles, or rhonchi. No acute distress. Heart:  Regular rate and rhythm; no murmurs, clicks, rubs,  or gallops. Abdomen: Non-distended, normal bowel sounds.  Soft and nontender without appreciable mass or hepatosplenomegaly.  Pulses:  Normal pulses noted. Extremities:  Without clubbing or edema. Rectal:  Moderately good sphincter tone soft stool in rectal vault. Hemoccult negative. No masses.   Impression:  Pleasant 61 year old gentleman with chronic left lower quadrant abdominal pain, constipation and daily opioid use. I suspect abdominal pain and constipation related. History of colonic adenoma.  Due for surveillance colonoscopy soon. He may be a good candidate for Movantik.  Recommendations: I have offered the patient a surveillance smells colonoscopy at this time to assess current status of his colon. Will make further recommendations regarding management of constipation in the near future.  The risks, benefits, limitations, alternatives and imponderables have been reviewed with the patient. Questions have been answered. All parties are agreeable.        Notice: This dictation was prepared with Dragon dictation along with smaller phrase technology. Any transcriptional errors that result from this process are unintentional and  may not be corrected upon review.

## 2017-05-18 NOTE — Patient Instructions (Addendum)
Schedule a colonoscopy - constipation / history of polyps - propofol  Extra 1/2 day of clear liquids  Decrease Metformin to 500 mg in the morning day before procedure and hold day of procedure  Further recommendations to follow

## 2017-06-04 ENCOUNTER — Ambulatory Visit (HOSPITAL_COMMUNITY)
Admission: RE | Admit: 2017-06-04 | Discharge: 2017-06-04 | Disposition: A | Discharge status: Home / Self Care | Payer: Medicare HMO | Source: Ambulatory Visit | Attending: Neurology | Admitting: Neurology

## 2017-06-04 ENCOUNTER — Other Ambulatory Visit (HOSPITAL_COMMUNITY): Payer: Self-pay | Admitting: Neurology

## 2017-06-04 DIAGNOSIS — I700 Atherosclerosis of aorta: Secondary | ICD-10-CM | POA: Diagnosis not present

## 2017-06-04 DIAGNOSIS — M47817 Spondylosis without myelopathy or radiculopathy, lumbosacral region: Secondary | ICD-10-CM | POA: Insufficient documentation

## 2017-06-04 DIAGNOSIS — M545 Low back pain: Secondary | ICD-10-CM | POA: Insufficient documentation

## 2017-06-04 NOTE — Patient Instructions (Signed)
Kasim Mccorkle Kilcrease  06/04/2017     @PREFPERIOPPHARMACY @   Your procedure is scheduled on 06/14/17.  Report to [facility] at [phone] A.M.  Call this number if you have problems the morning of surgery:  [phone]   Remember:  Do not eat food or drink liquids after midnight.  Take these medicines the morning of surgery with A SIP OF WATER norvasc, buspar, celexa, valium, losartan, protonix, diovan, topamax, norco if needed            & zofran if needed.   Do not wear jewelry, make-up or nail polish.  Do not wear lotions, powders, or perfumes, or deoderant.  Do not shave 48 hours prior to surgery.  Men may shave face and neck.  Do not bring valuables to the hospital.  [facility] is not responsible for any belongings or valuables.  Contacts, dentures or bridgework may not be worn into surgery.  Leave your suitcase in the car.  After surgery it may be brought to your room.  For patients admitted to the hospital, discharge time will be determined by your treatment team.  Patients discharged the day of surgery will not be allowed to drive home.   Name and phone number of your driver:   family Special instructions:    Please read over the following fact sheets that you were given.       Colonoscopy, Adult A colonoscopy is an exam to look at the entire large intestine. During the exam, a lubricated, bendable tube is inserted into the anus and then passed into the rectum, colon, and other parts of the large intestine. A colonoscopy is often done as a part of normal colorectal screening or in response to certain symptoms, such as anemia, persistent diarrhea, abdominal pain, and blood in the stool. The exam can help screen for and diagnose medical problems, including:  Tumors.  Polyps.  Inflammation.  Areas of bleeding.  Tell a health care provider about:  Any allergies you have.  All medicines you are taking, including vitamins, herbs, eye drops, creams, and over-the-counter  medicines.  Any problems you or family members have had with anesthetic medicines.  Any blood disorders you have.  Any surgeries you have had.  Any medical conditions you have.  Any problems you have had passing stool. What are the risks? Generally, this is a safe procedure. However, problems may occur, including:  Bleeding.  A tear in the intestine.  A reaction to medicines given during the exam.  Infection (rare).  What happens before the procedure? Eating and drinking restrictions Follow instructions from your health care provider about eating and drinking, which may include:  A few days before the procedure - follow a low-fiber diet. Avoid nuts, seeds, dried fruit, raw fruits, and vegetables.  1-3 days before the procedure - follow a clear liquid diet. Drink only clear liquids, such as clear broth or bouillon, black coffee or tea, clear juice, clear soft drinks or sports drinks, gelatin dessert, and popsicles. Avoid any liquids that contain red or purple dye.  On the day of the procedure - do not eat or drink anything during the 2 hours before the procedure, or within the time period that your health care provider recommends.  Bowel prep If you were prescribed an oral bowel prep to clean out your colon:  Take it as told by your health care provider. Starting the day before your procedure, you will need to drink a large amount of medicated liquid.  The liquid will cause you to have multiple loose stools until your stool is almost clear or light green.  If your skin or anus gets irritated from diarrhea, you may use these to relieve the irritation: ? Medicated wipes, such as adult wet wipes with aloe and vitamin E. ? A skin soothing-product like petroleum jelly.  If you vomit while drinking the bowel prep, take a break for up to 60 minutes and then begin the bowel prep again. If vomiting continues and you cannot take the bowel prep without vomiting, call your health care  provider.  General instructions  Ask your health care provider about changing or stopping your regular medicines. This is especially important if you are taking diabetes medicines or blood thinners.  Plan to have someone take you home from the hospital or clinic. What happens during the procedure?  An IV tube may be inserted into one of your veins.  You will be given medicine to help you relax (sedative).  To reduce your risk of infection: ? Your health care team will wash or sanitize their hands. ? Your anal area will be washed with soap.  You will be asked to lie on your side with your knees bent.  Your health care provider will lubricate a Stary, thin, flexible tube. The tube will have a camera and a light on the end.  The tube will be inserted into your anus.  The tube will be gently eased through your rectum and colon.  Air will be delivered into your colon to keep it open. You may feel some pressure or cramping.  The camera will be used to take images during the procedure.  A small tissue sample may be removed from your body to be examined under a microscope (biopsy). If any potential problems are found, the tissue will be sent to a lab for testing.  If small polyps are found, your health care provider may remove them and have them checked for cancer cells.  The tube that was inserted into your anus will be slowly removed. The procedure may vary among health care providers and hospitals. What happens after the procedure?  Your blood pressure, heart rate, breathing rate, and blood oxygen level will be monitored until the medicines you were given have worn off.  Do not drive for 24 hours after the exam.  You may have a small amount of blood in your stool.  You may pass gas and have mild abdominal cramping or bloating due to the air that was used to inflate your colon during the exam.  It is up to you to get the results of your procedure. Ask your health care provider,  or the department performing the procedure, when your results will be ready. This information is not intended to replace advice given to you by your health care provider. Make sure you discuss any questions you have with your health care provider. Document Released: 08/14/2000 Document Revised: 06/17/2016 Document Reviewed: 10/29/2015 Elsevier Interactive Patient Education  2018 [place] POST-ANESTHESIA  IMMEDIATELY FOLLOWING SURGERY:  Do not drive or operate machinery for the first twenty four hours after surgery.  Do not make any important decisions for twenty four hours after surgery or while taking narcotic pain medications or sedatives.  If you develop intractable nausea and vomiting or a severe headache please notify your doctor immediately.  FOLLOW-UP:  Please make an appointment with your surgeon as instructed. You do not need to follow up with anesthesia unless specifically  instructed to do so.  WOUND CARE INSTRUCTIONS (if applicable):  Keep a dry clean dressing on the anesthesia/puncture wound site if there is drainage.  Once the wound has quit draining you may leave it open to air.  Generally you should leave the bandage intact for twenty four hours unless there is drainage.  If the epidural site drains for more than 36-48 hours please call the anesthesia department.  QUESTIONS?:  Please feel free to call your physician or the hospital operator if you have any questions, and they will be happy to assist you.

## 2017-06-09 ENCOUNTER — Encounter (HOSPITAL_COMMUNITY): Payer: Self-pay

## 2017-06-09 ENCOUNTER — Encounter (HOSPITAL_COMMUNITY)
Admission: RE | Admit: 2017-06-09 | Discharge: 2017-06-09 | Disposition: A | Discharge status: Home / Self Care | Payer: Medicare HMO | Source: Ambulatory Visit | Attending: Internal Medicine | Admitting: Internal Medicine

## 2017-06-09 DIAGNOSIS — Z01810 Encounter for preprocedural cardiovascular examination: Secondary | ICD-10-CM | POA: Insufficient documentation

## 2017-06-09 DIAGNOSIS — Z01812 Encounter for preprocedural laboratory examination: Secondary | ICD-10-CM | POA: Diagnosis present

## 2017-06-09 LAB — BASIC METABOLIC PANEL
Anion gap: 10 (ref 5–15)
BUN: 27 mg/dL — ABNORMAL HIGH (ref 6–20)
CO2: 24 mmol/L (ref 22–32)
Calcium: 9.7 mg/dL (ref 8.9–10.3)
Chloride: 107 mmol/L (ref 101–111)
Creatinine, Ser: 1.23 mg/dL (ref 0.61–1.24)
GFR calc Af Amer: >60 mL/min (ref >60)
GFR calc non Af Amer: >60 mL/min (ref >60)
Glucose, Bld: 108 mg/dL — ABNORMAL HIGH (ref 65–99)
Potassium: 3.7 mmol/L (ref 3.5–5.1)
Sodium: 141 mmol/L (ref 135–145)

## 2017-06-09 LAB — CBC WITH DIFFERENTIAL/PLATELET
Basophils Absolute: 0.1 10*3/uL (ref 0.0–0.1)
Basophils Relative: 1 %
Eosinophils Absolute: 0.4 10*3/uL (ref 0.0–0.7)
Eosinophils Relative: 3 %
HCT: 42.9 % (ref 39.0–52.0)
Hemoglobin: 14.2 g/dL (ref 13.0–17.0)
Lymphocytes Relative: 35 %
Lymphs Abs: 4.1 10*3/uL — ABNORMAL HIGH (ref 0.7–4.0)
MCH: 32.5 pg (ref 26.0–34.0)
MCHC: 33.1 g/dL (ref 30.0–36.0)
MCV: 98.2 fL (ref 78.0–100.0)
Monocytes Absolute: 1.4 10*3/uL — ABNORMAL HIGH (ref 0.1–1.0)
Monocytes Relative: 12 %
Neutro Abs: 5.9 10*3/uL (ref 1.7–7.7)
Neutrophils Relative %: 49 %
Platelets: 213 10*3/uL (ref 150–400)
RBC: 4.37 MIL/uL (ref 4.22–5.81)
RDW: 13.3 % (ref 11.5–15.5)
WBC: 11.8 10*3/uL — ABNORMAL HIGH (ref 4.0–10.5)

## 2017-06-14 ENCOUNTER — Encounter (HOSPITAL_COMMUNITY)
Admission: RE | Disposition: A | Discharge status: Home / Self Care | Payer: Self-pay | Source: Ambulatory Visit | Attending: Internal Medicine

## 2017-06-14 ENCOUNTER — Telehealth: Payer: Self-pay

## 2017-06-14 ENCOUNTER — Ambulatory Visit (HOSPITAL_COMMUNITY)
Admission: RE | Admit: 2017-06-14 | Discharge: 2017-06-14 | Disposition: A | Discharge status: Home / Self Care | Payer: Medicare HMO | Source: Ambulatory Visit | Attending: Internal Medicine | Admitting: Internal Medicine

## 2017-06-14 ENCOUNTER — Ambulatory Visit (HOSPITAL_COMMUNITY): Payer: Medicare HMO | Admitting: Anesthesiology

## 2017-06-14 ENCOUNTER — Encounter (HOSPITAL_COMMUNITY): Payer: Self-pay

## 2017-06-14 DIAGNOSIS — Z7982 Long term (current) use of aspirin: Secondary | ICD-10-CM | POA: Insufficient documentation

## 2017-06-14 DIAGNOSIS — K219 Gastro-esophageal reflux disease without esophagitis: Secondary | ICD-10-CM | POA: Insufficient documentation

## 2017-06-14 DIAGNOSIS — Z1211 Encounter for screening for malignant neoplasm of colon: Secondary | ICD-10-CM | POA: Diagnosis not present

## 2017-06-14 DIAGNOSIS — E7800 Pure hypercholesterolemia, unspecified: Secondary | ICD-10-CM | POA: Diagnosis not present

## 2017-06-14 DIAGNOSIS — F17210 Nicotine dependence, cigarettes, uncomplicated: Secondary | ICD-10-CM | POA: Diagnosis not present

## 2017-06-14 DIAGNOSIS — G4730 Sleep apnea, unspecified: Secondary | ICD-10-CM | POA: Insufficient documentation

## 2017-06-14 DIAGNOSIS — Z860100 Personal history of colon polyps, unspecified: Secondary | ICD-10-CM

## 2017-06-14 DIAGNOSIS — E1143 Type 2 diabetes mellitus with diabetic autonomic (poly)neuropathy: Secondary | ICD-10-CM | POA: Diagnosis not present

## 2017-06-14 DIAGNOSIS — K589 Irritable bowel syndrome without diarrhea: Secondary | ICD-10-CM | POA: Diagnosis not present

## 2017-06-14 DIAGNOSIS — G40909 Epilepsy, unspecified, not intractable, without status epilepticus: Secondary | ICD-10-CM | POA: Insufficient documentation

## 2017-06-14 DIAGNOSIS — K635 Polyp of colon: Secondary | ICD-10-CM | POA: Insufficient documentation

## 2017-06-14 DIAGNOSIS — K3184 Gastroparesis: Secondary | ICD-10-CM | POA: Insufficient documentation

## 2017-06-14 DIAGNOSIS — F329 Major depressive disorder, single episode, unspecified: Secondary | ICD-10-CM | POA: Diagnosis not present

## 2017-06-14 DIAGNOSIS — I10 Essential (primary) hypertension: Secondary | ICD-10-CM | POA: Diagnosis not present

## 2017-06-14 DIAGNOSIS — E669 Obesity, unspecified: Secondary | ICD-10-CM | POA: Diagnosis not present

## 2017-06-14 DIAGNOSIS — Z7984 Long term (current) use of oral hypoglycemic drugs: Secondary | ICD-10-CM | POA: Diagnosis not present

## 2017-06-14 DIAGNOSIS — K5730 Diverticulosis of large intestine without perforation or abscess without bleeding: Secondary | ICD-10-CM | POA: Insufficient documentation

## 2017-06-14 DIAGNOSIS — Z79899 Other long term (current) drug therapy: Secondary | ICD-10-CM | POA: Diagnosis not present

## 2017-06-14 DIAGNOSIS — G8929 Other chronic pain: Secondary | ICD-10-CM | POA: Diagnosis not present

## 2017-06-14 DIAGNOSIS — F419 Anxiety disorder, unspecified: Secondary | ICD-10-CM | POA: Insufficient documentation

## 2017-06-14 DIAGNOSIS — K5900 Constipation, unspecified: Secondary | ICD-10-CM

## 2017-06-14 HISTORY — PX: COLONOSCOPY WITH PROPOFOL: SHX5780

## 2017-06-14 HISTORY — PX: POLYPECTOMY: SHX5525

## 2017-06-14 LAB — GLUCOSE, CAPILLARY: Glucose-Capillary: 105 mg/dL — ABNORMAL HIGH (ref 65–99)

## 2017-06-14 SURGERY — COLONOSCOPY WITH PROPOFOL
Anesthesia: Monitor Anesthesia Care

## 2017-06-14 MED ORDER — MIDAZOLAM HCL 2 MG/2ML IJ SOLN
1.0000 mg | INTRAMUSCULAR | Status: AC
  Administered 2017-06-14: 2 mg via INTRAVENOUS

## 2017-06-14 MED ORDER — LACTATED RINGERS IV SOLN
INTRAVENOUS | Status: DC
  Administered 2017-06-14: 07:00:00 via INTRAVENOUS

## 2017-06-14 MED ORDER — FENTANYL CITRATE (PF) 100 MCG/2ML IJ SOLN
INTRAMUSCULAR | Status: AC
  Filled 2017-06-14: qty 2

## 2017-06-14 MED ORDER — CHLORHEXIDINE GLUCONATE CLOTH 2 % EX PADS: 6.0000 | MEDICATED_PAD | Freq: Once | CUTANEOUS | Status: DC

## 2017-06-14 MED ORDER — GLYCOPYRROLATE 0.2 MG/ML IJ SOLN
INTRAMUSCULAR | Status: AC
  Filled 2017-06-14: qty 1

## 2017-06-14 MED ORDER — GLYCOPYRROLATE 0.2 MG/ML IJ SOLN
0.2000 mg | Freq: Once | INTRAMUSCULAR | Status: AC | PRN
  Administered 2017-06-14: 0.2 mg via INTRAVENOUS

## 2017-06-14 MED ORDER — PROPOFOL 500 MG/50ML IV EMUL
INTRAVENOUS | Status: DC | PRN
  Administered 2017-06-14: 150 ug/kg/min via INTRAVENOUS

## 2017-06-14 MED ORDER — PROPOFOL 10 MG/ML IV BOLUS
INTRAVENOUS | Status: AC
  Filled 2017-06-14: qty 40

## 2017-06-14 MED ORDER — MIDAZOLAM HCL 2 MG/2ML IJ SOLN
INTRAMUSCULAR | Status: AC
  Filled 2017-06-14: qty 2

## 2017-06-14 MED ORDER — FENTANYL CITRATE (PF) 100 MCG/2ML IJ SOLN
25.0000 ug | Freq: Once | INTRAMUSCULAR | Status: AC
  Administered 2017-06-14: 25 ug via INTRAVENOUS

## 2017-06-14 NOTE — H&P (View-Only) (Signed)
Primary Care Physician:  Vidal Schwalbe, MD Primary Gastroenterologist:  Dr. Gala Romney  Pre-Procedure History & Physical: HPI:  Brian [name] is a 61 y.o. male here for further evaluation of a left lower quadrant abdominal pain and assess need for signs colonoscopy. History of colonic adenoma. Last colonoscopy over 4 years ago. Hyperplastic polyp removed at that time; prior adenoma. Patient has had a year or so history of left lower quadrant abdominal pain. Chronic constipation likely opioid induced. Takes a fiber supplement daily which does not make much of a difference. Has chronically strained. Doesn't really evacuate very  well. CT scan a year ago demonstrated a large stool burden without significant other findings. Hasn't had any rectal bleeding. Has tried various laxatives in the past including Linzess; Has not tried Movantik.  Past Medical History:  Diagnosis Date  . Adenomatous colon polyp       . Anxiety   . Chronic abdominal pain 08/11/11   Reported per patient, Dr. Fabio Neighbors in [place]  . Chronic diarrhea   . Depression   . Diverticulosis   . DM (diabetes mellitus) ([place])   . Gastroparesis   . GERD (gastroesophageal reflux disease)   . H. pylori infection 2003   pt lived in [place], treated with prevpac.  . H/O Clostridium difficile infection 12/2012  . Headache(784.0)    migraines  . High cholesterol   . HOH (hard of hearing)   . HTN (hypertension)   . IBS (irritable bowel syndrome)   . LVH (left ventricular hypertrophy)   . Neuropathic pain of left forearm   . Obesity   . Polypharmacy 12/26/2014  . Seizure disorder ([place])    1 seizure with severe back pain. no further meds or seizures  . Seizures ([place])   . Shortness of breath   . Sleep apnea    not using cpap  . Syncope and collapse 12/25/2014   Thought be secondary to seizure.    Past Surgical History:  Procedure Laterality Date  . arm surgery     tendon/left  . CHOLECYSTECTOMY  11/20/2011   Procedure:  LAPAROSCOPIC CHOLECYSTECTOMY;  Surgeon: Donato Heinz, MD;  Location: [facility];  Service: General;  Laterality: N/A;  . COLONOSCOPY  2012   Dr. Rhunette Croft, [place].pt gives history of adenomatous polyps and says he is due for repeat colonoscopy in 3 years.   . COLONOSCOPY WITH PROPOFOL N/A 04/13/2013   ZOX:WRUEAVW diverticulosis. Single colonic polyp, hyperplastic. Surveillance 2019.   Marland Kitchen ESOPHAGEAL DILATION N/A 01/25/2014   Procedure: ESOPHAGEAL DILATION;  Surgeon: Daneil Dolin, MD;  Location: [facility];  Service: Endoscopy;  Laterality: N/A;  Malony 56 french, no heme noted after dilation  . ESOPHAGOGASTRODUODENOSCOPY  09/02/10   [facility], Dr. 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 pylori  . ESOPHAGOGASTRODUODENOSCOPY  02/03/2006   Dr. Ileene Rubens White-> hiatal hernia, atrial erosions  . ESOPHAGOGASTRODUODENOSCOPY  01/21/2012   UJW:JXBJYN lesion at arytenoid cartilage on the right-likely explains some of his oro- pharyngeal symptoms/Hiatal hernia/Schatzki's ring s/p dilation, gastric erosions without H.pylori  . ESOPHAGOGASTRODUODENOSCOPY (EGD) WITH PROPOFOL N/A 01/25/2014   Dr. Gala Romney- abnormal distal esophagus s/p passage of maloney dilator, hiatal hernia, stomach bx= mild chronic inflammation, esophagus bx= benign squamous mucosa  . LARYNX SURGERY     cyst removed, ENT [place]  . LARYNX SURGERY    .  Lens placement in eye    . LIVER BIOPSY  11/20/2011   benign  . TONSILLECTOMY    . VENA CAVA FILTER PLACEMENT      Prior to Admission medications   Medication Sig Start Date End Date Taking? Authorizing Provider  amitriptyline (ELAVIL) 100 MG tablet Take 0.5 tablets (50 mg total) by mouth at bedtime. Patient taking differently: Take 200 mg by mouth at bedtime.  12/27/14  Yes Black, Lezlie Octave, NP   amLODipine (NORVASC) 5 MG tablet Take 5 mg by mouth daily.  03/17/16  Yes [provider]  aspirin 81 MG tablet Take 81 mg by mouth daily.   Yes [provider]  busPIRone (BUSPAR) 15 MG tablet Take 15 mg by mouth 4 (four) times daily.   Yes [provider]  Cholecalciferol (VITAMIN D3) 50000 UNITS CAPS Take 1 capsule by mouth once a week. Takes on Sunday 11/13/14  Yes [provider]  citalopram (CELEXA) 20 MG tablet Take 20 mg by mouth daily.   Yes [provider]  diazepam (VALIUM) 5 MG tablet Take 10 mg by mouth every 6 (six) hours as needed for anxiety.  03/13/14  Yes [provider]  diphenhydrAMINE (BENADRYL) 25 MG tablet Take 25 mg by mouth 2 (two) times daily.   Yes [provider]  HYDROcodone-acetaminophen (NORCO) 10-325 MG tablet Take 1 tablet by mouth every 6 (six) hours as needed.   Yes [provider]  ketoconazole (NIZORAL) 2 % cream Apply 1 application topically daily as needed for irritation.  03/30/16  Yes [provider]  metFORMIN (GLUCOPHAGE) 500 MG tablet Take 500 mg by mouth 2 (two) times daily with a meal.  01/29/13  Yes Memon, Jolaine Artist, MD  MILK THISTLE PO Take 1,000 mg by mouth every evening.    Yes [provider]  mometasone (ELOCON) 0.1 % cream APPLY EXTERNALLY ONCE DAILY FOR  ITCHING 03/05/16  Yes [provider]  Multiple Vitamin (MULTIVITAMIN) capsule Take 1 capsule by mouth daily.   Yes [provider]  Omega-3 Fatty Acids (FISH OIL) 1000 MG CAPS Take 1 capsule by mouth daily.   Yes [provider]  ondansetron (ZOFRAN) 8 MG tablet TAKE 1 TABLET TWICE DAILY AS NEEDED FOR NAUSEA OR VOMITING 09/23/16  Yes Carlis Stable, NP  pantoprazole (PROTONIX) 40 MG tablet Take 1 tablet (40 mg total) by mouth 2 (two) times daily. 12/27/14  Yes Black, Lezlie Octave, NP  Probiotic Product (PROBIOTIC DAILY PO) Take 1 tablet by mouth daily.    Yes [provider]  rizatriptan  (MAXALT-MLT) 10 MG disintegrating tablet Take one tablet by mouth every 2 hours as needed for migraine pain. Not to excedd 2 tablets within a 24 hour period 03/05/16  Yes [provider]  simvastatin (ZOCOR) 80 MG tablet Take 80 mg by mouth every evening.  12/28/13  Yes [provider]  sucralfate (CARAFATE) 1 G tablet Take 1 g by mouth 4 (four) times daily. 1.5 pill at bed time   Yes [provider]  topiramate (TOPAMAX) 100 MG tablet Take 1 tab every morning and 1.5 tabs every evening. Patient taking differently: Take 100-150 mg by mouth 2 (two) times daily. Take one tablet in the morning and one and one half tablet in the evening 12/27/14  Yes Black, Lezlie Octave, NP  valsartan (DIOVAN) 160 MG tablet Take 160 mg by mouth daily.  03/17/16  Yes [provider]  vitamin B-12 (CYANOCOBALAMIN) 1000 MCG tablet Take  1,000 mcg by mouth daily.    Yes [provider]  vitamin E 400 UNIT capsule Take 400 Units by mouth daily.    Yes [provider]    Allergies as of 05/18/2017 - Review Complete 05/18/2017  Allergen Reaction Noted  . Doxycycline  02/01/2017  . Ketorolac tromethamine  10/27/2011  . Sulfamethoxazole Rash 10/27/2011  . Tetracyclines & related Rash 10/27/2011    Family History  Problem Relation Age of Onset  . Cancer Father 41       Gallbladder  . Diabetes Mother   . GER disease Mother   . Anesthesia problems Neg Hx   . Hypotension Neg Hx   . Malignant hyperthermia Neg Hx   . Pseudochol deficiency Neg Hx   . Colon cancer Neg Hx     Social History   Social History  . Marital status: Divorced    Spouse name: N/A  . Number of children: 1  . Years of education: N/A   Occupational History  . disabled    Social History Main Topics  . Smoking status: Current Every Day Smoker    Packs/day: 1.00    Years: 42.00    Types: Cigarettes  . Smokeless tobacco: Former Systems developer    Types: Chew     Comment: one pack a day  . Alcohol use 0.0  oz/week     Comment: occasionally; about once a month  . Drug use: Yes    Types: Marijuana     Comment: "every once in a while"  . Sexual activity: Not on file   Other Topics Concern  . Not on file   Social History Narrative   Divorced, moved to [facility] November 2012    Review of Systems: See HPI, otherwise negative ROS  Physical Exam: BP 106/64   Pulse 86   Temp 98.3 F (36.8 C) (Oral)   Ht 6' (1.829 m)   Wt 224 lb (101.6 kg)   BMI 30.38 kg/m  General:   Alert,  , well-nourished, pleasant and cooperative in NAD Neck:  Supple; no masses or thyromegaly. No significant cervical adenopathy. Lungs:  Clear throughout to auscultation.   No wheezes, crackles, or rhonchi. No acute distress. Heart:  Regular rate and rhythm; no murmurs, clicks, rubs,  or gallops. Abdomen: Non-distended, normal bowel sounds.  Soft and nontender without appreciable mass or hepatosplenomegaly.  Pulses:  Normal pulses noted. Extremities:  Without clubbing or edema. Rectal:  Moderately good sphincter tone soft stool in rectal vault. Hemoccult negative. No masses.   Impression:  Pleasant 61 year old gentleman with chronic left lower quadrant abdominal pain, constipation and daily opioid use. I suspect abdominal pain and constipation related. History of colonic adenoma.  Due for surveillance colonoscopy soon. He may be a good candidate for Movantik.  Recommendations: I have offered the patient a surveillance smells colonoscopy at this time to assess current status of his colon. Will make further recommendations regarding management of constipation in the near future.  The risks, benefits, limitations, alternatives and imponderables have been reviewed with the patient. Questions have been answered. All parties are agreeable.        Notice: This dictation was prepared with Dragon dictation along with smaller phrase technology. Any transcriptional errors that result from this process are unintentional and  may not be corrected upon review.

## 2017-06-14 NOTE — Transfer of Care (Signed)
Immediate Anesthesia Transfer of Care Note  Patient: Brian [name]  Procedure(s) Performed: COLONOSCOPY WITH PROPOFOL (N/A ) POLYPECTOMY  Patient Location: PACU  Anesthesia Type:MAC  Level of Consciousness: awake, alert  and oriented  Airway & Oxygen Therapy: Patient Spontanous Breathing  Post-op Assessment: Report given to RN  Post vital signs: Reviewed and stable  Last Vitals:  Vitals:   06/14/17 0715 06/14/17 0720  BP: 106/65 104/62  Pulse:    Resp: (!) 9 11  Temp:    SpO2: 97% 98%    Last Pain:  Vitals:   06/14/17 0739  TempSrc:   PainSc: 5          Complications: No apparent anesthesia complications

## 2017-06-14 NOTE — Interval H&P Note (Signed)
History and Physical Interval Note:  06/14/2017 7:25 AM  Brian [name]  has presented today for surgery, with the diagnosis of constipation, history of polyps  The various methods of treatment have been discussed with the patient and family. After consideration of risks, benefits and other options for treatment, the patient has consented to  Procedure(s) with comments: COLONOSCOPY WITH PROPOFOL (N/A) - 7:30am as a surgical intervention .  The patient's history has been reviewed, patient examined, no change in status, stable for surgery.  I have reviewed the patient's chart and labs.  Questions were answered to the patient's satisfaction.     [name]  No change. Colonoscopy per plan.  The risks, benefits, limitations, alternatives and imponderables have been reviewed with the patient. Questions have been answered. All parties are agreeable.

## 2017-06-14 NOTE — Telephone Encounter (Signed)
Per RMR- samples of movanik 12.5mg  left at the front desk.

## 2017-06-14 NOTE — Op Note (Signed)
[facility] Patient Name: Brian [name] Procedure Date: 06/14/2017 7:05 AM MRN: [id number] Date of Birth: 01-08-1956 Attending MD: Norvel Richards , MD CSN: [id number] Age: 61 Admit Type: Outpatient Procedure:                Colonoscopy Indications:              High risk colon cancer surveillance: Personal                            history of colonic polyps Providers:                Norvel Richards, MD, Otis Peak B. Sharon Seller, RN,                            Janeece Riggers, RN Referring MD:              Medicines:                Propofol per Anesthesia Complications:            No immediate complications. Estimated Blood Loss:     Estimated blood loss was minimal. Procedure:                Pre-Anesthesia Assessment:                           - Prior to the procedure, a History and Physical                            was performed, and patient medications and                            allergies were reviewed. The patient's tolerance of                            previous anesthesia was also reviewed. The risks                            and benefits of the procedure and the sedation                            options and risks were discussed with the patient.                            All questions were answered, and informed consent                            was obtained. Prior Anticoagulants: The patient has                            taken no previous anticoagulant or antiplatelet                            agents. ASA Grade Assessment: III - A patient with  severe systemic disease. After reviewing the risks                            and benefits, the patient was deemed in                            satisfactory condition to undergo the procedure.                           After obtaining informed consent, the colonoscope                            was passed under direct vision. Throughout the                            procedure, the  patient's blood pressure, pulse, and                            oxygen saturations were monitored continuously. The                            EC-[id number] ([id number]) scope was introduced through                            the and advanced to the the cecum, identified by                            appendiceal orifice and ileocecal valve. The                            ileocecal valve, appendiceal orifice, and rectum                            were photographed. The entire colon was well                            visualized. The quality of the bowel preparation                            was adequate. Scope In: 7:43:01 AM Scope Out: 8:00:47 AM Scope Withdrawal Time: 0 hours 11 minutes 29 seconds  Total Procedure Duration: 0 hours 17 minutes 46 seconds  Findings:      The perianal and digital rectal examinations were normal.      Scattered small and large-mouthed diverticula were found in the sigmoid       colon and descending colon.      Three sessile polyps were found in the sigmoid colon. The polyps were 2       to 5 mm in size. These polyps were removed with a cold snare. Resection       and retrieval were complete. Estimated blood loss was minimal.      The exam was otherwise without abnormality on direct and retroflexion       views. Impression:               - Diverticulosis in  the sigmoid colon and in the                            descending colon.                           - Three 2 to 5 mm polyps in the sigmoid colon,                            removed with a cold snare. Resected and retrieved.                           - The examination was otherwise normal on direct                            and retroflexion views. Moderate Sedation:      Moderate (conscious) sedation was personally administered by an       anesthesia professional. The following parameters were monitored: oxygen       saturation, heart rate, blood pressure, respiratory rate, EKG, adequacy       of pulmonary  ventilation, and response to care. Total physician       intraservice time was 22 minutes. Recommendation:           - Patient has a contact number available for                            emergencies. The signs and symptoms of potential                            delayed complications were discussed with the                            patient. Return to normal activities tomorrow.                            Written discharge instructions were provided to the                            patient.                           - Resume previous diet.                           - Continue present medications. Begin Movantik 12.5                            mg daily?" go by my ofice for samples. If this                            works, we will move forward with a prescription.                            Also, begin Benefiber 1 tablespoon twice daily                           -  Await pathology results.                           - Repeat colonoscopy date to be determined after                            pending pathology results are reviewed for                            surveillance based on pathology results.                           - Return to GI clinic in 3 months. Procedure Code(s):        --- Professional ---                           [phone], Colonoscopy, flexible; with removal of                            tumor(s), polyp(s), or other lesion(s) by snare                            technique Diagnosis Code(s):        --- Professional ---                           Z86.010, Personal history of colonic polyps                           D12.5, Benign neoplasm of sigmoid colon                           K57.30, Diverticulosis of large intestine without                            perforation or abscess without bleeding CPT copyright 2016 American Medical Association. All rights reserved. The codes documented in this report are preliminary and upon coder review may  be revised to meet current  compliance requirements. [name]. Rourk, MD Norvel Richards, MD 06/14/2017 8:13:40 AM This report has been signed electronically. Number of Addenda: 0

## 2017-06-14 NOTE — Anesthesia Preprocedure Evaluation (Signed)
Anesthesia Evaluation  Patient identified by MRN, date of birth, ID band Patient awake    Reviewed: Allergy & Precautions, H&P , NPO status , Patient's Chart, lab work & pertinent test results  Airway Mallampati: I  TM Distance: >3 FB Neck ROM: Full    Dental no notable dental hx. (+) Teeth Intact   Pulmonary sleep apnea , Current Smoker,    Pulmonary exam normal breath sounds clear to auscultation       Cardiovascular hypertension, Pt. on medications  Rhythm:Regular Rate:Normal     Neuro/Psych  Headaches, Seizures -, Well Controlled,  negative psych ROS   GI/Hepatic GERD  Medicated and Controlled,  Endo/Other  diabetes, Well Controlled, Type 2, Oral Hypoglycemic Agents  Renal/GU      Musculoskeletal negative musculoskeletal ROS (+)   Abdominal Normal abdominal exam  (+)   Peds  Hematology negative hematology ROS (+)   Anesthesia Other Findings   Reproductive/Obstetrics                             Anesthesia Physical Anesthesia Plan  ASA: III  Anesthesia Plan: MAC   Post-op Pain Management:    Induction: Intravenous  PONV Risk Score and Plan:   Airway Management Planned: Simple Face Mask  Additional Equipment:   Intra-op Plan:   Post-operative Plan:   Informed Consent: I have reviewed the patients History and Physical, chart, labs and discussed the procedure including the risks, benefits and alternatives for the proposed anesthesia with the patient or authorized representative who has indicated his/her understanding and acceptance.     Plan Discussed with:   Anesthesia Plan Comments:         Anesthesia Quick Evaluation

## 2017-06-14 NOTE — Anesthesia Postprocedure Evaluation (Signed)
Anesthesia Post Note  Patient: Brian [name]  Procedure(s) Performed: COLONOSCOPY WITH PROPOFOL (N/A ) POLYPECTOMY  Patient location during evaluation: PACU Anesthesia Type: MAC Level of consciousness: awake and alert and oriented Pain management: pain level controlled Vital Signs Assessment: post-procedure vital signs reviewed and stable Respiratory status: spontaneous breathing Cardiovascular status: blood pressure returned to baseline and stable Postop Assessment: no apparent nausea or vomiting Anesthetic complications: no     Last Vitals:  Vitals:   06/14/17 0815 06/14/17 0828  BP: (!) 108/93 116/81  Pulse: 65 64  Resp: 11 16  Temp:  36.5 C  SpO2: 98% 98%    Last Pain:  Vitals:   06/14/17 0828  TempSrc: Oral  PainSc:                  Tressie Stalker

## 2017-06-14 NOTE — Discharge Instructions (Signed)
Colon Polyps Polyps are tissue growths inside the body. Polyps can grow in many places, including the large intestine (colon). A polyp may be a round bump or a mushroom-shaped growth. You could have one polyp or several. Most colon polyps are noncancerous (benign). However, some colon polyps can become cancerous over time. What are the causes? The exact cause of colon polyps is not known. What increases the risk? This condition is more likely to develop in people who:  Have a family history of colon cancer or colon polyps.  Are older than 57 or older than 45 if they are African American.  Have inflammatory bowel disease, such as ulcerative colitis or Crohn disease.  Are overweight.  Smoke cigarettes.  Do not get enough exercise.  Drink too much alcohol.  Eat a diet that is: ? High in fat and red meat. ? Low in fiber.  Had childhood cancer that was treated with abdominal radiation.  What are the signs or symptoms? Most polyps do not cause symptoms. If you have symptoms, they may include:  Blood coming from your rectum when having a bowel movement.  Blood in your stool.The stool may look dark red or black.  A change in bowel habits, such as constipation or diarrhea.  How is this diagnosed? This condition is diagnosed with a colonoscopy. This is a procedure that uses a lighted, flexible scope to look at the inside of your colon. How is this treated? Treatment for this condition involves removing any polyps that are found. Those polyps will then be tested for cancer. If cancer is found, your health care provider will talk to you about options for colon cancer treatment. Follow these instructions at home: Diet  Eat plenty of fiber, such as fruits, vegetables, and whole grains.  Eat foods that are high in calcium and vitamin D, such as milk, cheese, yogurt, eggs, liver, fish, and broccoli.  Limit foods high in fat, red meats, and processed meats, such as hot dogs, sausage,  bacon, and lunch meats.  Maintain a healthy weight, or lose weight if recommended by your health care provider. General instructions  Do not smoke cigarettes.  Do not drink alcohol excessively.  Keep all follow-up visits as told by your health care provider. This is important. This includes keeping regularly scheduled colonoscopies. Talk to your health care provider about when you need a colonoscopy.  Exercise every day or as told by your health care provider. Contact a health care provider if:  You have new or worsening bleeding during a bowel movement.  You have new or increased blood in your stool.  You have a change in bowel habits.  You unexpectedly lose weight. This information is not intended to replace advice given to you by your health care provider. Make sure you discuss any questions you have with your health care provider. Document Released: 05/13/2004 Document Revised: 01/23/2016 Document Reviewed: 07/08/2015 Elsevier Interactive Patient Education  [employer]. Diverticulosis Diverticulosis is a condition that develops when small pouches (diverticula) form in the wall of the large intestine (colon). The colon is where water is absorbed and stool is formed. The pouches form when the inside layer of the colon pushes through weak spots in the outer layers of the colon. You may have a few pouches or many of them. What are the causes? The cause of this condition is not known. What increases the risk? The following factors may make you more likely to develop this condition:  Being older than age  35. Your risk for this condition increases with age. Diverticulosis is rare among people younger than age 71. By age 56, many people have it.  Eating a low-fiber diet.  Having frequent constipation.  Being overweight.  Not getting enough exercise.  Smoking.  Taking over-the-counter pain medicines, like aspirin and ibuprofen.  Having a family history of  diverticulosis.  What are the signs or symptoms? In most people, there are no symptoms of this condition. If you do have symptoms, they may include:  Bloating.  Cramps in the abdomen.  Constipation or diarrhea.  Pain in the lower left side of the abdomen.  How is this diagnosed? This condition is most often diagnosed during an exam for other colon problems. Because diverticulosis usually has no symptoms, it often cannot be diagnosed independently. This condition may be diagnosed by:  Using a flexible scope to examine the colon (colonoscopy).  Taking an X-ray of the colon after dye has been put into the colon (barium enema).  Doing a CT scan.  How is this treated? You may not need treatment for this condition if you have never developed an infection related to diverticulosis. If you have had an infection before, treatment may include:  Eating a high-fiber diet. This may include eating more fruits, vegetables, and grains.  Taking a fiber supplement.  Taking a live bacteria supplement (probiotic).  Taking medicine to relax your colon.  Taking antibiotic medicines.  Follow these instructions at home:  Drink 6-8 glasses of water or more each day to prevent constipation.  Try not to strain when you have a bowel movement.  If you have had an infection before: ? Eat more fiber as directed by your health care provider or your diet and nutrition specialist (dietitian). ? Take a fiber supplement or probiotic, if your health care provider approves.  Take over-the-counter and prescription medicines only as told by your health care provider.  If you were prescribed an antibiotic, take it as told by your health care provider. Do not stop taking the antibiotic even if you start to feel better.  Keep all follow-up visits as told by your health care provider. This is important. Contact a health care provider if:  You have pain in your abdomen.  You have bloating.  You have  cramps.  You have not had a bowel movement in 3 days. Get help right away if:  Your pain gets worse.  Your bloating becomes very bad.  You have a fever or chills, and your symptoms suddenly get worse.  You vomit.  You have bowel movements that are bloody or black.  You have bleeding from your rectum. Summary  Diverticulosis is a condition that develops when small pouches (diverticula) form in the wall of the large intestine (colon).  You may have a few pouches or many of them.  This condition is most often diagnosed during an exam for other colon problems.  If you have had an infection related to diverticulosis, treatment may include increasing the fiber in your diet, taking supplements, or taking medicines. This information is not intended to replace advice given to you by your health care provider. Make sure you discuss any questions you have with your health care provider. Document Released: 05/14/2004 Document Revised: 07/06/2016 Document Reviewed: 07/06/2016 Elsevier Interactive Patient Education  2017 [employer]. Constipation, Adult Constipation is when a person has fewer bowel movements in a week than normal, has difficulty having a bowel movement, or has stools that are dry, hard, or  larger than normal. Constipation may be caused by an underlying condition. It may become worse with age if a person takes certain medicines and does not take in enough fluids. Follow these instructions at home: Eating and drinking   Eat foods that have a lot of fiber, such as fresh fruits and vegetables, whole grains, and beans.  Limit foods that are high in fat, low in fiber, or overly processed, such as french fries, hamburgers, cookies, candies, and soda.  Drink enough fluid to keep your urine clear or pale yellow. General instructions  Exercise regularly or as told by your health care provider.  Go to the restroom when you have the urge to go. Do not hold it in.  Take  over-the-counter and prescription medicines only as told by your health care provider. These include any fiber supplements.  Practice pelvic floor retraining exercises, such as deep breathing while relaxing the lower abdomen and pelvic floor relaxation during bowel movements.  Watch your condition for any changes.  Keep all follow-up visits as told by your health care provider. This is important. Contact a health care provider if:  You have pain that gets worse.  You have a fever.  You do not have a bowel movement after 4 days.  You vomit.  You are not hungry.  You lose weight.  You are bleeding from the anus.  You have thin, pencil-like stools. Get help right away if:  You have a fever and your symptoms suddenly get worse.  You leak stool or have blood in your stool.  Your abdomen is bloated.  You have severe pain in your abdomen.  You feel dizzy or you faint. This information is not intended to replace advice given to you by your health care provider. Make sure you discuss any questions you have with your health care provider. Document Released: 05/15/2004 Document Revised: 03/06/2016 Document Reviewed: 02/05/2016 Elsevier Interactive Patient Education  2017 [employer].  Colonoscopy Discharge Instructions  Read the instructions outlined below and refer to this sheet in the next few weeks. These discharge instructions provide you with general information on caring for yourself after you leave the hospital. Your doctor may also give you specific instructions. While your treatment has been planned according to the most current medical practices available, unavoidable complications occasionally occur. If you have any problems or questions after discharge, call Dr. Gala Romney at [phone]. ACTIVITY  You may resume your regular activity, but move at a slower pace for the next 24 hours.   Take frequent rest periods for the next 24 hours.   Walking will help get rid of the air  and reduce the bloated feeling in your belly (abdomen).   No driving for 24 hours (because of the medicine (anesthesia) used during the test).    Do not sign any important legal documents or operate any machinery for 24 hours (because of the anesthesia used during the test).  NUTRITION  Drink plenty of fluids.   You may resume your normal diet as instructed by your doctor.   Begin with a light meal and progress to your normal diet. Heavy or fried foods are harder to digest and may make you feel sick to your stomach (nauseated).   Avoid alcoholic beverages for 24 hours or as instructed.  MEDICATIONS  You may resume your normal medications unless your doctor tells you otherwise.  WHAT YOU CAN EXPECT TODAY  Some feelings of bloating in the abdomen.   Passage of more gas than usual.  Spotting of blood in your stool or on the toilet paper.  IF YOU HAD POLYPS REMOVED DURING THE COLONOSCOPY:  No aspirin products for 7 days or as instructed.   No alcohol for 7 days or as instructed.   Eat a soft diet for the next 24 hours.  FINDING OUT THE RESULTS OF YOUR TEST Not all test results are available during your visit. If your test results are not back during the visit, make an appointment with your caregiver to find out the results. Do not assume everything is normal if you have not heard from your caregiver or the medical facility. It is important for you to follow up on all of your test results.  SEEK IMMEDIATE MEDICAL ATTENTION IF:  You have more than a spotting of blood in your stool.   Your belly is swollen (abdominal distention).   You are nauseated or vomiting.   You have a temperature over 101.   You have abdominal pain or discomfort that is severe or gets worse throughout the day.    Constipation information provided  Polyp and diverticulosis information provided  Begin movantik 12.5 mg daily for constipation -go by my office for free samples. If this works for you, we  will continue on this medication via prescription  Begin  Benefiber 1 tablespoon twice daily.  Office vwith [place] in 3 months  Further recommendations to follow pending review of pathology report Again my Lucianne Lei

## 2017-06-16 ENCOUNTER — Encounter (HOSPITAL_COMMUNITY): Payer: Self-pay | Admitting: Internal Medicine

## 2017-06-18 ENCOUNTER — Emergency Department (HOSPITAL_COMMUNITY)
Admission: EM | Admit: 2017-06-18 | Discharge: 2017-06-18 | Disposition: A | Discharge status: Home / Self Care | Payer: No Typology Code available for payment source | Attending: Emergency Medicine | Admitting: Emergency Medicine

## 2017-06-18 ENCOUNTER — Emergency Department (HOSPITAL_COMMUNITY): Payer: No Typology Code available for payment source

## 2017-06-18 ENCOUNTER — Encounter (HOSPITAL_COMMUNITY): Payer: Self-pay | Admitting: Emergency Medicine

## 2017-06-18 DIAGNOSIS — Z7984 Long term (current) use of oral hypoglycemic drugs: Secondary | ICD-10-CM | POA: Insufficient documentation

## 2017-06-18 DIAGNOSIS — I10 Essential (primary) hypertension: Secondary | ICD-10-CM | POA: Diagnosis not present

## 2017-06-18 DIAGNOSIS — S161XXA Strain of muscle, fascia and tendon at neck level, initial encounter: Secondary | ICD-10-CM | POA: Insufficient documentation

## 2017-06-18 DIAGNOSIS — Y999 Unspecified external cause status: Secondary | ICD-10-CM | POA: Diagnosis not present

## 2017-06-18 DIAGNOSIS — Y92410 Unspecified street and highway as the place of occurrence of the external cause: Secondary | ICD-10-CM | POA: Insufficient documentation

## 2017-06-18 DIAGNOSIS — E1143 Type 2 diabetes mellitus with diabetic autonomic (poly)neuropathy: Secondary | ICD-10-CM | POA: Diagnosis not present

## 2017-06-18 DIAGNOSIS — Z7982 Long term (current) use of aspirin: Secondary | ICD-10-CM | POA: Diagnosis not present

## 2017-06-18 DIAGNOSIS — F17210 Nicotine dependence, cigarettes, uncomplicated: Secondary | ICD-10-CM | POA: Insufficient documentation

## 2017-06-18 DIAGNOSIS — Y9389 Activity, other specified: Secondary | ICD-10-CM | POA: Diagnosis not present

## 2017-06-18 DIAGNOSIS — Z79899 Other long term (current) drug therapy: Secondary | ICD-10-CM | POA: Diagnosis not present

## 2017-06-18 DIAGNOSIS — S199XXA Unspecified injury of neck, initial encounter: Secondary | ICD-10-CM | POA: Diagnosis present

## 2017-06-18 MED ORDER — CYCLOBENZAPRINE HCL 10 MG PO TABS: 10.0000 mg | ORAL_TABLET | Freq: Three times a day (TID) | ORAL | 0 refills | Status: DC

## 2017-06-18 MED ORDER — CYCLOBENZAPRINE HCL 10 MG PO TABS
10.0000 mg | ORAL_TABLET | Freq: Once | ORAL | Status: AC
  Administered 2017-06-18: 10 mg via ORAL
  Filled 2017-06-18: qty 1

## 2017-06-18 MED ORDER — HYDROCODONE-ACETAMINOPHEN 5-325 MG PO TABS
2.0000 | ORAL_TABLET | Freq: Once | ORAL | Status: DC
  Filled 2017-06-18: qty 2
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
## 2017-06-18 NOTE — Discharge Instructions (Signed)
Your blood pressure is elevated at 158/96, otherwise your vital signs within normal limits. Your oxygen level is 98% on room air. The x-ray of your cervical spine shows degenerative disc disease, but no fracture or dislocation. Your examination is negative for acute neurologic or vascular deficits.  Heating pad to your neck and shoulders and back will be helpful. Use Flexeril 3 times daily. This medication may cause drowsiness. Please do not drive, operate machinery, or to spread in activities requiring concentration when taking this medication. You may continue your current pain management along with this medication. Please see your primary physician or return to the emergency department if any changes, problems, or concerns.

## 2017-06-21 ENCOUNTER — Telehealth: Payer: Self-pay | Admitting: Internal Medicine

## 2017-06-21 NOTE — Telephone Encounter (Signed)
[phone] PATIENT CALLED INQUIRING ABOUT PROCEDURE RESULTS FROM LAST WEEK , PLEASE CALL IF THEY ARE READY

## 2017-06-23 NOTE — Telephone Encounter (Signed)
Routing to RMR. 

## 2017-06-24 ENCOUNTER — Telehealth: Payer: Self-pay

## 2017-06-24 ENCOUNTER — Encounter: Payer: Self-pay | Admitting: Internal Medicine

## 2017-06-24 NOTE — Telephone Encounter (Signed)
See letter.

## 2017-06-24 NOTE — Telephone Encounter (Signed)
Spoke with pt about his results. Pt want to know if he can increase Zofran or take another medication to help with his symptoms? He is staying nauseated with pain on his lt side. When asked is he having frequent bowel movements, pt states that he has had some constipation when going to the bathroom. PT continues to take sample given in office Linzess.

## 2017-06-24 NOTE — Telephone Encounter (Signed)
Clarification needed. Did he give samples of Linzess or Movantik  from the office?Marland Kitchen

## 2017-06-25 NOTE — Telephone Encounter (Signed)
Pt left VM. I returned his call and lmom asking for clarity of which sample he is taking.

## 2017-06-25 NOTE — Telephone Encounter (Signed)
LMOM for a return call.  

## 2017-06-30 NOTE — Telephone Encounter (Signed)
Spoke with pt and when he gets home, he will confirm the samples he's currently taking

## 2017-07-06 ENCOUNTER — Other Ambulatory Visit: Payer: Self-pay | Admitting: Emergency Medicine

## 2017-07-06 ENCOUNTER — Other Ambulatory Visit (HOSPITAL_COMMUNITY): Payer: Self-pay | Admitting: Emergency Medicine

## 2017-07-06 DIAGNOSIS — S161XXD Strain of muscle, fascia and tendon at neck level, subsequent encounter: Secondary | ICD-10-CM

## 2017-07-06 NOTE — Telephone Encounter (Signed)
Pt is taking Movantik. Pt feels the nausea has improved some since the last called.

## 2017-07-06 NOTE — Telephone Encounter (Signed)
Good to hear. Continue Movantik.

## 2017-07-08 ENCOUNTER — Other Ambulatory Visit: Payer: Self-pay

## 2017-07-08 ENCOUNTER — Emergency Department (HOSPITAL_COMMUNITY): Payer: Medicare HMO

## 2017-07-08 ENCOUNTER — Encounter (HOSPITAL_COMMUNITY): Payer: Self-pay | Admitting: *Deleted

## 2017-07-08 ENCOUNTER — Observation Stay (HOSPITAL_COMMUNITY)
Admission: EM | Admit: 2017-07-08 | Discharge: 2017-07-10 | Disposition: A | Discharge status: Home / Self Care | Payer: Medicare HMO | Attending: Internal Medicine | Admitting: Internal Medicine

## 2017-07-08 DIAGNOSIS — F329 Major depressive disorder, single episode, unspecified: Secondary | ICD-10-CM | POA: Diagnosis not present

## 2017-07-08 DIAGNOSIS — K219 Gastro-esophageal reflux disease without esophagitis: Secondary | ICD-10-CM | POA: Diagnosis not present

## 2017-07-08 DIAGNOSIS — E785 Hyperlipidemia, unspecified: Secondary | ICD-10-CM | POA: Diagnosis not present

## 2017-07-08 DIAGNOSIS — K3184 Gastroparesis: Secondary | ICD-10-CM | POA: Diagnosis not present

## 2017-07-08 DIAGNOSIS — E1122 Type 2 diabetes mellitus with diabetic chronic kidney disease: Secondary | ICD-10-CM | POA: Insufficient documentation

## 2017-07-08 DIAGNOSIS — E1142 Type 2 diabetes mellitus with diabetic polyneuropathy: Secondary | ICD-10-CM | POA: Diagnosis not present

## 2017-07-08 DIAGNOSIS — M542 Cervicalgia: Secondary | ICD-10-CM | POA: Diagnosis not present

## 2017-07-08 DIAGNOSIS — I129 Hypertensive chronic kidney disease with stage 1 through stage 4 chronic kidney disease, or unspecified chronic kidney disease: Secondary | ICD-10-CM | POA: Diagnosis not present

## 2017-07-08 DIAGNOSIS — Z7982 Long term (current) use of aspirin: Secondary | ICD-10-CM | POA: Diagnosis not present

## 2017-07-08 DIAGNOSIS — G894 Chronic pain syndrome: Secondary | ICD-10-CM

## 2017-07-08 DIAGNOSIS — R55 Syncope and collapse: Secondary | ICD-10-CM

## 2017-07-08 DIAGNOSIS — G40909 Epilepsy, unspecified, not intractable, without status epilepticus: Secondary | ICD-10-CM | POA: Insufficient documentation

## 2017-07-08 DIAGNOSIS — Z6829 Body mass index (BMI) 29.0-29.9, adult: Secondary | ICD-10-CM | POA: Diagnosis not present

## 2017-07-08 DIAGNOSIS — Z7984 Long term (current) use of oral hypoglycemic drugs: Secondary | ICD-10-CM | POA: Insufficient documentation

## 2017-07-08 DIAGNOSIS — I700 Atherosclerosis of aorta: Secondary | ICD-10-CM | POA: Diagnosis not present

## 2017-07-08 DIAGNOSIS — E669 Obesity, unspecified: Secondary | ICD-10-CM | POA: Insufficient documentation

## 2017-07-08 DIAGNOSIS — R0789 Other chest pain: Secondary | ICD-10-CM | POA: Diagnosis not present

## 2017-07-08 DIAGNOSIS — Z882 Allergy status to sulfonamides status: Secondary | ICD-10-CM | POA: Insufficient documentation

## 2017-07-08 DIAGNOSIS — E119 Type 2 diabetes mellitus without complications: Secondary | ICD-10-CM

## 2017-07-08 DIAGNOSIS — K5730 Diverticulosis of large intestine without perforation or abscess without bleeding: Secondary | ICD-10-CM | POA: Insufficient documentation

## 2017-07-08 DIAGNOSIS — G43909 Migraine, unspecified, not intractable, without status migrainosus: Secondary | ICD-10-CM | POA: Insufficient documentation

## 2017-07-08 DIAGNOSIS — G8929 Other chronic pain: Secondary | ICD-10-CM | POA: Diagnosis present

## 2017-07-08 DIAGNOSIS — G4730 Sleep apnea, unspecified: Secondary | ICD-10-CM | POA: Insufficient documentation

## 2017-07-08 DIAGNOSIS — K589 Irritable bowel syndrome without diarrhea: Secondary | ICD-10-CM | POA: Diagnosis not present

## 2017-07-08 DIAGNOSIS — Z720 Tobacco use: Secondary | ICD-10-CM | POA: Diagnosis present

## 2017-07-08 DIAGNOSIS — E1143 Type 2 diabetes mellitus with diabetic autonomic (poly)neuropathy: Secondary | ICD-10-CM | POA: Insufficient documentation

## 2017-07-08 DIAGNOSIS — J449 Chronic obstructive pulmonary disease, unspecified: Secondary | ICD-10-CM | POA: Diagnosis not present

## 2017-07-08 DIAGNOSIS — R0600 Dyspnea, unspecified: Secondary | ICD-10-CM | POA: Diagnosis present

## 2017-07-08 DIAGNOSIS — N182 Chronic kidney disease, stage 2 (mild): Secondary | ICD-10-CM | POA: Insufficient documentation

## 2017-07-08 DIAGNOSIS — F17210 Nicotine dependence, cigarettes, uncomplicated: Secondary | ICD-10-CM | POA: Diagnosis not present

## 2017-07-08 DIAGNOSIS — R109 Unspecified abdominal pain: Secondary | ICD-10-CM | POA: Diagnosis present

## 2017-07-08 DIAGNOSIS — F419 Anxiety disorder, unspecified: Secondary | ICD-10-CM | POA: Insufficient documentation

## 2017-07-08 DIAGNOSIS — R072 Precordial pain: Secondary | ICD-10-CM

## 2017-07-08 HISTORY — DX: Migraine, unspecified, not intractable, without status migrainosus: G43.909

## 2017-07-08 HISTORY — DX: Hyperlipidemia, unspecified: E78.5

## 2017-07-08 HISTORY — DX: Unspecified effects of lightning, initial encounter: T75.00XA

## 2017-07-08 HISTORY — DX: Type 2 diabetes mellitus without complications: E11.9

## 2017-07-08 HISTORY — DX: Essential (primary) hypertension: I10

## 2017-07-08 LAB — CBC
HCT: 41.7 % (ref 39.0–52.0)
Hemoglobin: 13.7 g/dL (ref 13.0–17.0)
MCH: 32.5 pg (ref 26.0–34.0)
MCHC: 32.9 g/dL (ref 30.0–36.0)
MCV: 98.8 fL (ref 78.0–100.0)
Platelets: 225 10*3/uL (ref 150–400)
RBC: 4.22 MIL/uL (ref 4.22–5.81)
RDW: 13.3 % (ref 11.5–15.5)
WBC: 8.4 10*3/uL (ref 4.0–10.5)

## 2017-07-08 LAB — COMPREHENSIVE METABOLIC PANEL
ALT: 32 U/L (ref 17–63)
AST: 24 U/L (ref 15–41)
Albumin: 4.4 g/dL (ref 3.5–5.0)
Alkaline Phosphatase: 67 U/L (ref 38–126)
Anion gap: 11 (ref 5–15)
BUN: 22 mg/dL — ABNORMAL HIGH (ref 6–20)
CO2: 24 mmol/L (ref 22–32)
Calcium: 9.2 mg/dL (ref 8.9–10.3)
Chloride: 109 mmol/L (ref 101–111)
Creatinine, Ser: 1.45 mg/dL — ABNORMAL HIGH (ref 0.61–1.24)
GFR calc Af Amer: 59 mL/min — ABNORMAL LOW (ref >60)
GFR calc non Af Amer: 51 mL/min — ABNORMAL LOW (ref >60)
Glucose, Bld: 121 mg/dL — ABNORMAL HIGH (ref 65–99)
Potassium: 4.2 mmol/L (ref 3.5–5.1)
Sodium: 144 mmol/L (ref 135–145)
Total Bilirubin: 0.5 mg/dL (ref 0.3–1.2)
Total Protein: 7.5 g/dL (ref 6.5–8.1)

## 2017-07-08 LAB — LIPASE, BLOOD: Lipase: 19 U/L (ref 11–51)

## 2017-07-08 MED ORDER — SODIUM CHLORIDE 0.9 % IV BOLUS (SEPSIS)
1000.0000 mL | Freq: Once | INTRAVENOUS | Status: AC
  Administered 2017-07-08: 1000 mL via INTRAVENOUS

## 2017-07-08 MED ORDER — ONDANSETRON HCL 4 MG/2ML IJ SOLN
4.0000 mg | Freq: Once | INTRAMUSCULAR | Status: AC
  Administered 2017-07-08: 4 mg via INTRAVENOUS
  Filled 2017-07-08: qty 2

## 2017-07-08 MED ORDER — MORPHINE SULFATE (PF) 4 MG/ML IV SOLN
4.0000 mg | Freq: Once | INTRAVENOUS | Status: AC
  Administered 2017-07-08: 4 mg via INTRAVENOUS
  Filled 2017-07-08: qty 1

## 2017-07-08 NOTE — Telephone Encounter (Signed)
Lmom for a return call.  

## 2017-07-08 NOTE — ED Triage Notes (Signed)
Pt brought in by [facility] with c/o squeezing pain around umbilicus that started about 1.5 hours. Denies n/v/d.

## 2017-07-08 NOTE — ED Provider Notes (Signed)
[name] EMERGENCY DEPARTMENT Provider Note   CSN: [id number] Arrival date & time: 07/08/17  1728     History   Chief Complaint Chief Complaint  Patient presents with  . Abdominal Pain    HPI Brian [name] is a 61 y.o. male.  HPI   Brian [name] is a 61 y.o. male who presents to the Emergency Department complaining of mid lower abdominal pain of sudden onset earlier this evening.  Pt has hx of chronic abdominal pain, but states this pain is not typical for him.  He describes a sharp, squeezing pain around his umbilicus that radiates to his upper pelvis.  Pain is not associated with nausea or vomiting.  No pain or swelling to his penis or scrotum.  No flank pain or dysuria.  Pt states that he had a screening colonoscopy on one month in which some polyps were removed, but no significant pain since then until tonight.  Also complains of recurrent constipation, but admits to [facility] yesterday.     Past Medical History:  Diagnosis Date  . Adenomatous colon polyp       . Anxiety   . Chronic abdominal pain 08/11/11   Reported per patient, Dr. Fabio Neighbors in [place]  . Chronic diarrhea   . Depression   . Diverticulosis   . DM (diabetes mellitus) ([place])   . Gastroparesis   . GERD (gastroesophageal reflux disease)   . H. pylori infection 2003   pt lived in [place], treated with prevpac.  . H/O Clostridium difficile infection 12/2012  . Headache(784.0)    migraines  . High cholesterol   . HOH (hard of hearing)   . HTN (hypertension)   . IBS (irritable bowel syndrome)   . LVH (left ventricular hypertrophy)   . Neuropathic pain of left forearm   . Obesity   . Polypharmacy 12/26/2014  . Seizure disorder ([place])    1 seizure with severe back pain. no further meds or seizures  . Seizures ([place])   . Shortness of breath   . Sleep apnea    not using cpap  . Syncope and collapse 12/25/2014   Thought be secondary to seizure.    Patient Active Problem List   Diagnosis Date Noted  .  Nausea without vomiting 09/04/2015  . Constipation 06/05/2015  . Abdominal pain, left lower quadrant 03/13/2015  . Dark stools 03/13/2015  . LVH (left ventricular hypertrophy) 12/27/2014  . Seizure disorder ([place]) 12/27/2014  . Neuropathic pain of left forearm 12/26/2014  . Polypharmacy 12/26/2014  . Laceration of occipital scalp 12/26/2014  . Syncope and collapse 12/25/2014  . Obesity 12/25/2014  . Abnormal LFTs 04/25/2014  . Unspecified constipation 04/25/2014  . Tobacco abuse 01/28/2013  . Diabetes type 2, controlled ([place]) 01/28/2013  . Dysphagia 12/13/2012  . Gastroparesis 12/13/2012  . GERD (gastroesophageal reflux disease) 12/15/2011  . Chronic abdominal pain 10/28/2011  . Gallbladder polyp 10/28/2011  . Fatty liver 10/28/2011    Past Surgical History:  Procedure Laterality Date  . arm surgery     tendon/left  . COLONOSCOPY  2012   Dr. Rhunette Croft, [place].pt gives history of adenomatous polyps and says he is due for repeat colonoscopy in 3 years.   . ESOPHAGOGASTRODUODENOSCOPY  09/02/10   [facility], Dr. 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 pylori  . ESOPHAGOGASTRODUODENOSCOPY  02/03/2006   Dr. Ileene Rubens White-> hiatal hernia, atrial erosions  . ESOPHAGOGASTRODUODENOSCOPY  01/21/2012   PXT:GGYIRS lesion at arytenoid cartilage on the right-likely explains some of his oro- pharyngeal symptoms/Hiatal hernia/Schatzki's ring s/p dilation, gastric erosions without H.pylori  . LARYNX SURGERY     cyst removed, ENT [place]  . LARYNX SURGERY    . Lens placement in eye    . TONSILLECTOMY    . VENA CAVA FILTER PLACEMENT         Home Medications    Prior to Admission medications   Medication Sig Start Date End Date Taking? Authorizing Provider  amitriptyline (ELAVIL) 100  MG tablet Take 0.5 tablets (50 mg total) by mouth at bedtime. Patient taking differently: Take 200 mg by mouth at bedtime.  12/27/14   Black, Lezlie Octave, NP  amLODipine (NORVASC) 5 MG tablet Take 5 mg by mouth daily.  03/17/16   [provider]  aspirin 81 MG tablet Take 81 mg by mouth daily.    [provider]  busPIRone (BUSPAR) 15 MG tablet Take 15 mg by mouth 4 (four) times daily.    [provider]  Cholecalciferol (VITAMIN D3) 50000 UNITS CAPS Take 1 capsule by mouth once a week. Takes on Sunday 11/13/14   [provider]  citalopram (CELEXA) 20 MG tablet Take 20 mg by mouth daily.    [provider]  cyclobenzaprine (FLEXERIL) 10 MG tablet Take 1 tablet (10 mg total) by mouth 3 (three) times daily. 06/18/17   Lily Kocher, PA-C  diazepam (VALIUM) 10 MG tablet Take 10 mg by mouth every 6 (six) hours as needed for anxiety.  03/13/14   [provider]  diphenhydrAMINE (BENADRYL) 25 MG tablet Take 25 mg by mouth 2 (two) times daily.    [provider]  HYDROcodone-acetaminophen (NORCO) 10-325 MG tablet Take 1 tablet by mouth every 6 (six) hours as needed for moderate pain or severe pain.     [provider]  ketoconazole (NIZORAL) 2 % cream Apply 1 application topically daily as needed for irritation.  03/30/16   [provider]  losartan (COZAAR) 100 MG tablet Take 100 mg by mouth daily. 04/12/17   [provider]  metFORMIN (GLUCOPHAGE) 500 MG tablet Take 500 mg by mouth 2 (two) times daily with a meal.  01/29/13   Kathie Dike, MD  MILK THISTLE PO Take 1,000 mg by mouth every evening.     [provider]  mometasone (ELOCON) 0.1 % cream APPLY EXTERNALLY ONCE DAILY AS NEEDED FOR  ITCHING 03/05/16   [provider]  Multiple Vitamin (MULTIVITAMIN) capsule Take 1 capsule by mouth daily.    [provider]  Omega-3 Fatty Acids (FISH OIL) 1000 MG CAPS Take 1 capsule by mouth daily.     [provider]  ondansetron (ZOFRAN) 8 MG tablet TAKE 1 TABLET TWICE DAILY AS NEEDED FOR NAUSEA OR VOMITING 09/23/16   Carlis Stable, NP  pantoprazole (PROTONIX) 40 MG tablet Take 1 tablet (40 mg total) by mouth 2 (two) times daily. 12/27/14   Radene Gunning, NP  Probiotic Product (PROBIOTIC DAILY PO) Take 1 tablet by mouth daily.     [provider]  rizatriptan (MAXALT-MLT) 10 MG disintegrating tablet Take one tablet by mouth every 2 hours as needed for migraine pain. Not to excedd 2 tablets within a 24 hour period 03/05/16   [provider]  simvastatin (ZOCOR) 80 MG tablet Take 80 mg by mouth every evening.  12/28/13   [provider]  sucralfate (CARAFATE) 1 G tablet Take 1-1.5 g by mouth 4 (four) times daily. 1.5 pill at bed time    [provider]  topiramate (TOPAMAX) 100 MG tablet Take 1 tab every morning and 1.5 tabs every evening. Patient taking differently: Take 100-150 mg by mouth 2 (two) times daily. Take one tablet in the morning and one and one half tablet in the evening 12/27/14   Black, Lezlie Octave, NP  vitamin B-12 (CYANOCOBALAMIN) 1000 MCG tablet Take 1,000 mcg by mouth daily.     [provider]  vitamin E 400 UNIT capsule Take 400 Units by mouth daily.     [provider]    Family History Family History  Problem Relation Age of Onset  . Cancer Father 17       Gallbladder  . Diabetes Mother   . GER disease Mother   . Anesthesia problems Neg Hx   . Hypotension Neg Hx   . Malignant hyperthermia Neg Hx   . Pseudochol deficiency Neg Hx   . Colon cancer Neg Hx     Social History Social History   Tobacco Use  . Smoking status: Current Every Day Smoker    Packs/day: 1.00    Years: 42.00    Pack years: 42.00    Types: Cigarettes  . Smokeless tobacco: Former Systems developer    Types: Chew  . Tobacco comment: one pack a day  Substance Use Topics  . Alcohol use: Yes    Alcohol/week: 0.0 oz    Comment: occasionally; about  once a month  . Drug use: Yes    Types: Marijuana    Comment: "every once in a while"     Allergies   Doxycycline; Ketorolac tromethamine; Sulfamethoxazole; and Tetracyclines & related   Review of Systems Review of Systems  Constitutional: Negative for appetite change, chills and fever.  Respiratory: Negative for chest tightness.   Cardiovascular: Negative for chest pain.  Gastrointestinal: Positive for abdominal pain. Negative for blood in stool, nausea and vomiting.  Genitourinary: Negative for dysuria, flank pain, hematuria, penile swelling, scrotal swelling and testicular pain.  Musculoskeletal: Negative for back pain.  Skin: Negative for color change and rash.  Neurological: Negative for dizziness, weakness and numbness.  Hematological: Negative for adenopathy.  All other systems reviewed and are negative.    Physical Exam Updated Vital Signs BP 127/70   Pulse 71   Temp 98.3 F (36.8 C) (Oral)   Resp 17   Ht 6' (1.829 m)   Wt 99.8 kg (220 lb)   SpO2 97%   BMI 29.84 kg/m   Physical Exam  Constitutional: He is oriented to person, place, and time. He appears well-developed and well-nourished. No distress.  HENT:  Head: Atraumatic.  Mouth/Throat: Oropharynx is clear and moist.  Cardiovascular: Normal rate, regular rhythm and intact distal pulses.  No murmur heard. Pulmonary/Chest: Effort normal and breath sounds normal. No respiratory distress.  Abdominal: Soft. Normal appearance. He exhibits no distension and no mass. There is tenderness in the periumbilical area and suprapubic area. There is no rigidity, no rebound, no guarding, no CVA tenderness and no tenderness at McBurney's point. No hernia.    Midline tenderness from umbilicus to suprapubic region.  No palpable masses.  No inguinal tenderness.    Musculoskeletal: Normal range of motion.  Neurological: He is alert and oriented to person, place, and time. No sensory deficit.  Skin: Skin is warm. Capillary  refill takes less  than 2 seconds.  Nursing note and vitals reviewed.    ED Treatments / Results  Labs (all labs ordered are listed, but only abnormal results are displayed) Labs Reviewed  COMPREHENSIVE METABOLIC PANEL - Abnormal; Notable for the following components:      Result Value   Glucose, Bld 121 (*)    BUN 22 (*)    Creatinine, Ser 1.45 (*)    GFR calc non Af Amer 51 (*)    GFR calc Af Amer 59 (*)    All other components within normal limits  LIPASE, BLOOD  CBC  URINALYSIS, ROUTINE W REFLEX MICROSCOPIC  TROPONIN I  D-DIMER, QUANTITATIVE (NOT AT [facility])    EKG  EKG Interpretation None       Radiology Ct Abdomen Pelvis Wo Contrast  Result Date: 07/09/2017 CLINICAL DATA:  Abdominal pain EXAM: CT ABDOMEN AND PELVIS WITHOUT CONTRAST TECHNIQUE: Multidetector CT imaging of the abdomen and pelvis was performed following the standard protocol without IV contrast. COMPARISON:  04/20/2016, 03/13/2015 FINDINGS: Lower chest: Lung bases demonstrate no acute consolidation or pleural effusion. Aortic atherosclerosis. Normal heart size. Hepatobiliary: No focal liver abnormality is seen. Status post cholecystectomy. No biliary dilatation. Pancreas: Unremarkable. No pancreatic ductal dilatation or surrounding inflammatory changes. Spleen: Normal in size without focal abnormality. Adrenals/Urinary Tract: Adrenal glands are within normal limits. No hydronephrosis. Small exophytic cyst upper pole left kidney. Tiny exophytic hypodensity mid right kidney. Bladder unremarkable Stomach/Bowel: Stomach is within normal limits. Appendix appears normal. No evidence of bowel wall thickening, distention, or inflammatory changes. Sigmoid colon diverticular disease without acute inflammation Vascular/Lymphatic: Aortic atherosclerosis. No enlarged abdominal or pelvic lymph nodes. Reproductive: Slightly enlarged prostate with calcification Other: Negative for free air or free fluid. Tiny fat in the umbilicus  Musculoskeletal: Degenerative changes. No acute or suspicious lesion IMPRESSION: 1. No CT evidence for acute intra-abdominal or pelvic abnormality. 2. Sigmoid colon diverticular disease without acute inflammation. Electronically Signed   By: Donavan Foil M.D.   On: 07/09/2017 00:09    Procedures Procedures (including critical care time)  Medications Ordered in ED Medications  sodium chloride 0.9 % bolus 1,000 mL (not administered)  morphine 4 MG/ML injection 4 mg (not administered)  ondansetron (ZOFRAN) injection 4 mg (not administered)     Initial Impression / Assessment and Plan / ED Course  I have reviewed the triage vital signs and the nursing notes.  Pertinent labs & imaging results that were available during my care of the patient were reviewed by me and considered in my medical decision making (see chart for details).    0110  On reassessment, patient still unable to urinate.  Family member and patient now giving completely different history of symptoms and reason for ER visit.  States he was having sudden onset of shortness of breath and feeling "like I was going to die" states he was walking to a deer stand at onset and had extreme difficulty walking back to his house due to shortness of breath.  Called EMS and developed the previously mentioned abdominal pain after lying on the EMS stretcher.  Abdominal pain has been constant.  He denies cardiac history, chest pain.  He does admit to increased anxiety recently after being told that he has a "spot" on his neck that will need MRI for further evaluation.    Given change in history, I will order additional labs, EKG, and CXR.  I have discussed this patient with Dr. Kathrynn Humble who assumes care    Final Clinical Impressions(s) /  ED Diagnoses   Final diagnoses:  None    ED Discharge Orders    None       Kem Parkinson, PA-C 07/09/17 Sheldon, Ankit, MD 07/09/17 0302

## 2017-07-08 NOTE — Telephone Encounter (Signed)
Pt returned call and was notified to continue Dike. Samples of Movantik 25 mg were left upfront for pt.

## 2017-07-09 ENCOUNTER — Observation Stay (HOSPITAL_BASED_OUTPATIENT_CLINIC_OR_DEPARTMENT_OTHER): Payer: Medicare HMO

## 2017-07-09 ENCOUNTER — Encounter (HOSPITAL_COMMUNITY): Payer: Self-pay

## 2017-07-09 ENCOUNTER — Ambulatory Visit (HOSPITAL_COMMUNITY): Payer: Medicare HMO | Attending: Emergency Medicine

## 2017-07-09 ENCOUNTER — Emergency Department (HOSPITAL_COMMUNITY): Payer: Medicare HMO

## 2017-07-09 ENCOUNTER — Encounter (HOSPITAL_COMMUNITY)
Admission: EM | Disposition: A | Discharge status: Home / Self Care | Payer: Self-pay | Source: Home / Self Care | Attending: Emergency Medicine

## 2017-07-09 ENCOUNTER — Encounter (HOSPITAL_COMMUNITY): Payer: Self-pay | Admitting: Cardiology

## 2017-07-09 ENCOUNTER — Observation Stay (HOSPITAL_COMMUNITY): Payer: Medicare HMO

## 2017-07-09 DIAGNOSIS — I129 Hypertensive chronic kidney disease with stage 1 through stage 4 chronic kidney disease, or unspecified chronic kidney disease: Secondary | ICD-10-CM | POA: Diagnosis not present

## 2017-07-09 DIAGNOSIS — R072 Precordial pain: Secondary | ICD-10-CM | POA: Diagnosis not present

## 2017-07-09 DIAGNOSIS — G894 Chronic pain syndrome: Secondary | ICD-10-CM

## 2017-07-09 DIAGNOSIS — K3184 Gastroparesis: Secondary | ICD-10-CM

## 2017-07-09 DIAGNOSIS — E119 Type 2 diabetes mellitus without complications: Secondary | ICD-10-CM

## 2017-07-09 DIAGNOSIS — I10 Essential (primary) hypertension: Secondary | ICD-10-CM | POA: Diagnosis not present

## 2017-07-09 DIAGNOSIS — R0789 Other chest pain: Secondary | ICD-10-CM | POA: Diagnosis not present

## 2017-07-09 DIAGNOSIS — R109 Unspecified abdominal pain: Secondary | ICD-10-CM

## 2017-07-09 DIAGNOSIS — E782 Mixed hyperlipidemia: Secondary | ICD-10-CM | POA: Diagnosis not present

## 2017-07-09 DIAGNOSIS — R0600 Dyspnea, unspecified: Secondary | ICD-10-CM | POA: Diagnosis not present

## 2017-07-09 DIAGNOSIS — G8929 Other chronic pain: Secondary | ICD-10-CM

## 2017-07-09 DIAGNOSIS — Z720 Tobacco use: Secondary | ICD-10-CM

## 2017-07-09 DIAGNOSIS — Z9889 Other specified postprocedural states: Secondary | ICD-10-CM

## 2017-07-09 DIAGNOSIS — I200 Unstable angina: Secondary | ICD-10-CM | POA: Diagnosis not present

## 2017-07-09 DIAGNOSIS — E1122 Type 2 diabetes mellitus with diabetic chronic kidney disease: Secondary | ICD-10-CM | POA: Diagnosis not present

## 2017-07-09 DIAGNOSIS — R55 Syncope and collapse: Secondary | ICD-10-CM

## 2017-07-09 DIAGNOSIS — R0609 Other forms of dyspnea: Secondary | ICD-10-CM

## 2017-07-09 DIAGNOSIS — N182 Chronic kidney disease, stage 2 (mild): Secondary | ICD-10-CM | POA: Diagnosis not present

## 2017-07-09 HISTORY — DX: Other specified postprocedural states: Z98.890

## 2017-07-09 HISTORY — DX: Dyspnea, unspecified: R06.00

## 2017-07-09 HISTORY — DX: Chronic pain syndrome: G89.4

## 2017-07-09 HISTORY — DX: Precordial pain: R07.2

## 2017-07-09 HISTORY — PX: LEFT HEART CATH AND CORONARY ANGIOGRAPHY: CATH118249

## 2017-07-09 LAB — CBC
HCT: 40.8 % (ref 39.0–52.0)
Hemoglobin: 13.5 g/dL (ref 13.0–17.0)
MCH: 31.9 pg (ref 26.0–34.0)
MCHC: 33.1 g/dL (ref 30.0–36.0)
MCV: 96.5 fL (ref 78.0–100.0)
Platelets: 210 10*3/uL (ref 150–400)
RBC: 4.23 MIL/uL (ref 4.22–5.81)
RDW: 12.9 % (ref 11.5–15.5)
WBC: 7.2 10*3/uL (ref 4.0–10.5)

## 2017-07-09 LAB — GLUCOSE, CAPILLARY
Glucose-Capillary: 78 mg/dL (ref 65–99)
Glucose-Capillary: 78 mg/dL (ref 65–99)
Glucose-Capillary: 83 mg/dL (ref 65–99)
Glucose-Capillary: 67 mg/dL (ref 65–99)
Glucose-Capillary: 102 mg/dL — ABNORMAL HIGH (ref 65–99)

## 2017-07-09 LAB — URINALYSIS, ROUTINE W REFLEX MICROSCOPIC
Bilirubin Urine: NEGATIVE
Glucose, UA: NEGATIVE mg/dL
Hgb urine dipstick: NEGATIVE
Ketones, ur: NEGATIVE mg/dL
Leukocytes, UA: NEGATIVE
Nitrite: NEGATIVE
Protein, ur: NEGATIVE mg/dL
Specific Gravity, Urine: 1.014 (ref 1.005–1.030)
pH: 6 (ref 5.0–8.0)

## 2017-07-09 LAB — TROPONIN I
Troponin I: <0.03 ng/mL (ref <0.03)
Troponin I: <0.03 ng/mL (ref <0.03)
Troponin I: <0.03 ng/mL (ref <0.03)

## 2017-07-09 LAB — HEMOGLOBIN A1C
Hgb A1c MFr Bld: 5.8 % — ABNORMAL HIGH (ref 4.8–5.6)
Mean Plasma Glucose: 119.76 mg/dL

## 2017-07-09 LAB — CREATININE, SERUM
Creatinine, Ser: 1.22 mg/dL (ref 0.61–1.24)
GFR calc Af Amer: >60 mL/min (ref >60)
GFR calc non Af Amer: >60 mL/min (ref >60)

## 2017-07-09 LAB — D-DIMER, QUANTITATIVE: D-Dimer, Quant: 0.29 ug/mL-FEU (ref 0.00–0.50)

## 2017-07-09 LAB — TSH: TSH: 4.539 u[IU]/mL — ABNORMAL HIGH (ref 0.350–4.500)

## 2017-07-09 SURGERY — LEFT HEART CATH AND CORONARY ANGIOGRAPHY
Anesthesia: LOCAL

## 2017-07-09 MED ORDER — HEPARIN (PORCINE) IN NACL 2-0.9 UNIT/ML-% IJ SOLN
INTRAMUSCULAR | Status: AC
  Filled 2017-07-09: qty 1000

## 2017-07-09 MED ORDER — HEPARIN (PORCINE) IN NACL 2-0.9 UNIT/ML-% IJ SOLN
INTRAMUSCULAR | Status: AC | PRN
  Administered 2017-07-09: 1000 mL

## 2017-07-09 MED ORDER — SODIUM CHLORIDE 0.9% FLUSH: 3.0000 mL | INTRAVENOUS | Status: DC | PRN

## 2017-07-09 MED ORDER — GABAPENTIN 800 MG PO TABS
800.0000 mg | ORAL_TABLET | Freq: Three times a day (TID) | ORAL | Status: DC
  Filled 2017-07-09 (×10): qty 1

## 2017-07-09 MED ORDER — MIDAZOLAM HCL 2 MG/2ML IJ SOLN
INTRAMUSCULAR | Status: DC | PRN
  Administered 2017-07-09 (×2): 1 mg via INTRAVENOUS

## 2017-07-09 MED ORDER — SODIUM CHLORIDE 0.9 % IV SOLN: 250.0000 mL | INTRAVENOUS | Status: DC | PRN

## 2017-07-09 MED ORDER — SODIUM CHLORIDE 0.9 % WEIGHT BASED INFUSION: 1.0000 mL/kg/h | INTRAVENOUS | Status: DC

## 2017-07-09 MED ORDER — HEPARIN (PORCINE) IN NACL 2-0.9 UNIT/ML-% IJ SOLN
INTRAMUSCULAR | Status: DC | PRN
  Administered 2017-07-09: 10 mL via INTRA_ARTERIAL

## 2017-07-09 MED ORDER — SODIUM CHLORIDE 0.9% FLUSH: 3.0000 mL | Freq: Two times a day (BID) | INTRAVENOUS | Status: DC

## 2017-07-09 MED ORDER — MORPHINE SULFATE (PF) 4 MG/ML IV SOLN
4.0000 mg | Freq: Once | INTRAVENOUS | Status: AC
  Administered 2017-07-09: 4 mg via INTRAVENOUS
  Filled 2017-07-09: qty 1

## 2017-07-09 MED ORDER — SODIUM CHLORIDE 0.9 % WEIGHT BASED INFUSION: 1.0000 mL/kg/h | INTRAVENOUS | Status: AC

## 2017-07-09 MED ORDER — LIDOCAINE HCL (PF) 1 % IJ SOLN
INTRAMUSCULAR | Status: DC | PRN
  Administered 2017-07-09: 15 mL

## 2017-07-09 MED ORDER — IOPAMIDOL (ISOVUE-370) INJECTION 76%
INTRAVENOUS | Status: DC | PRN
  Administered 2017-07-09: 70 mL

## 2017-07-09 MED ORDER — BUSPIRONE HCL 10 MG PO TABS
15.0000 mg | ORAL_TABLET | Freq: Two times a day (BID) | ORAL | Status: DC
  Administered 2017-07-09 – 2017-07-10 (×2): 15 mg via ORAL
  Filled 2017-07-09 (×2): qty 2

## 2017-07-09 MED ORDER — TOPIRAMATE 100 MG PO TABS: 100.0000 mg | ORAL_TABLET | Freq: Two times a day (BID) | ORAL | Status: DC

## 2017-07-09 MED ORDER — ENOXAPARIN SODIUM 40 MG/0.4ML ~~LOC~~ SOLN
40.0000 mg | SUBCUTANEOUS | Status: DC
  Administered 2017-07-10: 09:00:00 40 mg via SUBCUTANEOUS
  Filled 2017-07-09: qty 0.4

## 2017-07-09 MED ORDER — HEPARIN SODIUM (PORCINE) 1000 UNIT/ML IJ SOLN
INTRAMUSCULAR | Status: AC
  Filled 2017-07-09: qty 1

## 2017-07-09 MED ORDER — SUCRALFATE 1 G PO TABS
1.0000 g | ORAL_TABLET | Freq: Three times a day (TID) | ORAL | Status: DC
  Administered 2017-07-10: 10:00:00 1 g via ORAL
  Filled 2017-07-09 (×3): qty 1

## 2017-07-09 MED ORDER — ATORVASTATIN CALCIUM 40 MG PO TABS
40.0000 mg | ORAL_TABLET | Freq: Every day | ORAL | Status: DC
  Administered 2017-07-09: 22:00:00 40 mg via ORAL
  Filled 2017-07-09: qty 1

## 2017-07-09 MED ORDER — ACETAMINOPHEN 325 MG PO TABS: 650.0000 mg | ORAL_TABLET | Freq: Four times a day (QID) | ORAL | Status: DC | PRN

## 2017-07-09 MED ORDER — CITALOPRAM HYDROBROMIDE 20 MG PO TABS
20.0000 mg | ORAL_TABLET | Freq: Every day | ORAL | Status: DC
  Administered 2017-07-10: 09:00:00 20 mg via ORAL
  Filled 2017-07-09: qty 1

## 2017-07-09 MED ORDER — LOSARTAN POTASSIUM 50 MG PO TABS
100.0000 mg | ORAL_TABLET | Freq: Every day | ORAL | Status: DC
  Administered 2017-07-09 – 2017-07-10 (×2): 100 mg via ORAL
  Filled 2017-07-09 (×2): qty 2

## 2017-07-09 MED ORDER — IOPAMIDOL (ISOVUE-370) INJECTION 76%
INTRAVENOUS | Status: AC
  Filled 2017-07-09: qty 100

## 2017-07-09 MED ORDER — GABAPENTIN 400 MG PO CAPS
800.0000 mg | ORAL_CAPSULE | Freq: Three times a day (TID) | ORAL | Status: DC
  Administered 2017-07-09 – 2017-07-10 (×2): 800 mg via ORAL
  Filled 2017-07-09 (×2): qty 2

## 2017-07-09 MED ORDER — DIAZEPAM 5 MG PO TABS
10.0000 mg | ORAL_TABLET | Freq: Four times a day (QID) | ORAL | Status: DC | PRN
  Administered 2017-07-09 – 2017-07-10 (×2): 10 mg via ORAL
  Filled 2017-07-09 (×2): qty 2

## 2017-07-09 MED ORDER — ASPIRIN EC 81 MG PO TBEC
81.0000 mg | DELAYED_RELEASE_TABLET | Freq: Every day | ORAL | Status: DC
  Administered 2017-07-10: 81 mg via ORAL
  Filled 2017-07-09: qty 1

## 2017-07-09 MED ORDER — FENTANYL CITRATE (PF) 100 MCG/2ML IJ SOLN
INTRAMUSCULAR | Status: DC | PRN
  Administered 2017-07-09 (×2): 25 ug via INTRAVENOUS

## 2017-07-09 MED ORDER — CYCLOBENZAPRINE HCL 10 MG PO TABS
10.0000 mg | ORAL_TABLET | Freq: Three times a day (TID) | ORAL | Status: DC
  Administered 2017-07-09 – 2017-07-10 (×2): 10 mg via ORAL
  Filled 2017-07-09 (×2): qty 1

## 2017-07-09 MED ORDER — ACETAMINOPHEN 650 MG RE SUPP: 650.0000 mg | Freq: Four times a day (QID) | RECTAL | Status: DC | PRN

## 2017-07-09 MED ORDER — DIPHENHYDRAMINE HCL 25 MG PO CAPS
25.0000 mg | ORAL_CAPSULE | Freq: Two times a day (BID) | ORAL | Status: DC
  Administered 2017-07-09 – 2017-07-10 (×2): 25 mg via ORAL
  Filled 2017-07-09 (×9): qty 1

## 2017-07-09 MED ORDER — METFORMIN HCL 500 MG PO TABS: 500.0000 mg | ORAL_TABLET | Freq: Two times a day (BID) | ORAL | Status: DC

## 2017-07-09 MED ORDER — SUCRALFATE 1 G PO TABS
1.5000 g | ORAL_TABLET | Freq: Every day | ORAL | Status: DC
  Administered 2017-07-09: 22:00:00 1.5 g via ORAL
  Filled 2017-07-09: qty 1.5

## 2017-07-09 MED ORDER — ASPIRIN 81 MG PO CHEW: 81.0000 mg | CHEWABLE_TABLET | ORAL | Status: DC

## 2017-07-09 MED ORDER — ENOXAPARIN SODIUM 40 MG/0.4ML ~~LOC~~ SOLN
40.0000 mg | SUBCUTANEOUS | Status: DC
  Administered 2017-07-09: 40 mg via SUBCUTANEOUS
  Filled 2017-07-09: qty 0.4

## 2017-07-09 MED ORDER — FAMOTIDINE 20 MG PO TABS
10.0000 mg | ORAL_TABLET | Freq: Every day | ORAL | Status: DC
  Administered 2017-07-09 (×2): 10 mg via ORAL
  Filled 2017-07-09: qty 1
  Filled 2017-07-09: qty 0.5

## 2017-07-09 MED ORDER — TOPIRAMATE 25 MG PO TABS
150.0000 mg | ORAL_TABLET | Freq: Every evening | ORAL | Status: DC
  Administered 2017-07-09 (×2): 150 mg via ORAL
  Filled 2017-07-09: qty 6

## 2017-07-09 MED ORDER — HYDROCODONE-ACETAMINOPHEN 10-325 MG PO TABS
1.0000 | ORAL_TABLET | Freq: Four times a day (QID) | ORAL | Status: DC | PRN
  Administered 2017-07-09 – 2017-07-10 (×4): 1 via ORAL
  Filled 2017-07-09 (×4): qty 1

## 2017-07-09 MED ORDER — MIDAZOLAM HCL 2 MG/2ML IJ SOLN
INTRAMUSCULAR | Status: AC
  Filled 2017-07-09: qty 2

## 2017-07-09 MED ORDER — SODIUM CHLORIDE 0.9 % WEIGHT BASED INFUSION: 3.0000 mL/kg/h | INTRAVENOUS | Status: DC

## 2017-07-09 MED ORDER — AMLODIPINE BESYLATE 5 MG PO TABS
5.0000 mg | ORAL_TABLET | Freq: Every day | ORAL | Status: DC
  Administered 2017-07-09 – 2017-07-10 (×2): 5 mg via ORAL
  Filled 2017-07-09 (×2): qty 1

## 2017-07-09 MED ORDER — TOPIRAMATE 25 MG PO TABS
100.0000 mg | ORAL_TABLET | Freq: Every day | ORAL | Status: DC
  Administered 2017-07-10: 09:00:00 100 mg via ORAL
  Filled 2017-07-09: qty 4

## 2017-07-09 MED ORDER — LIDOCAINE HCL (PF) 1 % IJ SOLN
INTRAMUSCULAR | Status: DC | PRN
  Administered 2017-07-09: 2 mL

## 2017-07-09 MED ORDER — VERAPAMIL HCL 2.5 MG/ML IV SOLN
INTRAVENOUS | Status: AC
  Filled 2017-07-09: qty 2

## 2017-07-09 MED ORDER — SODIUM CHLORIDE 0.9% FLUSH
3.0000 mL | Freq: Two times a day (BID) | INTRAVENOUS | Status: DC
  Administered 2017-07-09 – 2017-07-10 (×2): 3 mL via INTRAVENOUS

## 2017-07-09 MED ORDER — INSULIN ASPART 100 UNIT/ML ~~LOC~~ SOLN: 0.0000 [IU] | Freq: Three times a day (TID) | SUBCUTANEOUS | Status: DC

## 2017-07-09 MED ORDER — FENTANYL CITRATE (PF) 100 MCG/2ML IJ SOLN
INTRAMUSCULAR | Status: AC
  Filled 2017-07-09: qty 2

## 2017-07-09 MED ORDER — LIDOCAINE HCL (PF) 1 % IJ SOLN
INTRAMUSCULAR | Status: AC
  Filled 2017-07-09: qty 30

## 2017-07-09 SURGICAL SUPPLY — 14 items
CATH 5FR JL3.5 JR4 ANG PIG MP (CATHETERS) ×2 IMPLANT
COVER PRB 48X5XTLSCP FOLD TPE (BAG) ×1 IMPLANT
COVER PROBE 5X48 (BAG) ×1
DEVICE RAD COMP TR BAND LRG (VASCULAR PRODUCTS) ×2 IMPLANT
GLIDESHEATH SLEND SS 6F .021 (SHEATH) ×2 IMPLANT
GUIDEWIRE INQWIRE 1.5J.035X260 (WIRE) ×1 IMPLANT
INQWIRE 1.5J .035X260CM (WIRE) ×2
KIT HEART LEFT (KITS) ×2 IMPLANT
PACK CARDIAC CATHETERIZATION (CUSTOM PROCEDURE TRAY) ×2 IMPLANT
SHEATH PINNACLE 5F 10CM (SHEATH) ×2 IMPLANT
TRANSDUCER W/STOPCOCK (MISCELLANEOUS) ×2 IMPLANT
TUBING CIL FLEX 10 FLL-RA (TUBING) ×2 IMPLANT
WIRE EMERALD 3MM-J .035X150CM (WIRE) ×2 IMPLANT
WIRE HI TORQ VERSACORE-J 145CM (WIRE) ×2 IMPLANT

## 2017-07-09 NOTE — Progress Notes (Signed)
Site area: RFA Site Prior to Removal:  Level 0 Pressure Applied For:20 min Manual:   yes Patient Status During Pull:stable   Post Pull Site:  Level 0 Post Pull Instructions Given: yes  Post Pull Pulses Present: palpable Dressing Applied:  tegaderm Bedrest begins @ 6431 till 2215 Comments: removed by Dalbert Batman
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
## 2017-07-09 NOTE — Care Management Obs Status (Signed)
[place] NOTIFICATION   Patient Details  Name: DARYLL SPISAK MRN: [id number] Date of Birth: [birth year]   Medicare Observation Status Notification Given:  Yes    Sherald Barge, RN 07/09/2017, 1:24 PM

## 2017-07-09 NOTE — Progress Notes (Signed)
EKG complete and placed in patient chart 

## 2017-07-09 NOTE — Progress Notes (Signed)
Pt c/o chest pain, non radiating. Denies Shortness of Breath or nausea.  All vs wnl and charted.  Stat EKG ordered.  Paged MD.
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
## 2017-07-09 NOTE — Interval H&P Note (Signed)
History and Physical Interval Note:  07/09/2017 4:32 PM  Brian [name]  has presented today for surgery, with the diagnosis of cp  The various methods of treatment have been discussed with the patient and family. After consideration of risks, benefits and other options for treatment, the patient has consented to  Procedure(s): LEFT HEART CATH AND CORONARY ANGIOGRAPHY (N/A) as a surgical intervention .  The patient's history has been reviewed, patient examined, no change in status, stable for surgery.  I have reviewed the patient's chart and labs.  Questions were answered to the patient's satisfaction.   Cath Lab Visit (complete for each Cath Lab visit)  Clinical Evaluation Leading to the Procedure:   ACS: Yes.    Non-ACS:    Anginal Classification: CCS IV  Anti-ischemic medical therapy: Maximal Therapy (2 or more classes of medications)  Non-Invasive Test Results: No non-invasive testing performed  Prior CABG: No previous CABG        Collier Salina [facility] 07/09/2017 4:33 PM

## 2017-07-09 NOTE — ED Notes (Signed)
tammy pa at  Bedside talking to patient about his results that is when the patient's story changed from the reason why he was here was for sob and not being able to catch his breath. He states that while he was on the ems is when his abd started hurting him. Ems reported to the er staff that the patient was here for abd pain. Pt unable to get a urine sample after trying for about 20 minutes. Pt had ekg done, portable chest xray, and had more lab work done.
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
## 2017-07-09 NOTE — Progress Notes (Addendum)
Pt seen by cardiology. I spoke with Jory Sims, DNP with cardiology.  Concerned about unstable angina.  They request transfer to [place] for heart catheterization. Pt remains clinically/hemodynamically stable.   Pt informed and agrees with transfer.  DTat
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
## 2017-07-10 DIAGNOSIS — R072 Precordial pain: Secondary | ICD-10-CM

## 2017-07-10 DIAGNOSIS — R0789 Other chest pain: Secondary | ICD-10-CM

## 2017-07-10 LAB — CBC
HCT: 40 % (ref 39.0–52.0)
Hemoglobin: 13.2 g/dL (ref 13.0–17.0)
MCH: 31.7 pg (ref 26.0–34.0)
MCHC: 33 g/dL (ref 30.0–36.0)
MCV: 95.9 fL (ref 78.0–100.0)
Platelets: 200 10*3/uL (ref 150–400)
RBC: 4.17 MIL/uL — ABNORMAL LOW (ref 4.22–5.81)
RDW: 12.8 % (ref 11.5–15.5)
WBC: 6.6 10*3/uL (ref 4.0–10.5)

## 2017-07-10 LAB — ECHOCARDIOGRAM COMPLETE
Height: 72 in
Weight: 3515.2 oz

## 2017-07-10 LAB — COMPREHENSIVE METABOLIC PANEL
ALT: 29 U/L (ref 17–63)
AST: 21 U/L (ref 15–41)
Albumin: 3.4 g/dL — ABNORMAL LOW (ref 3.5–5.0)
Alkaline Phosphatase: 60 U/L (ref 38–126)
Anion gap: 6 (ref 5–15)
BUN: 16 mg/dL (ref 6–20)
CO2: 23 mmol/L (ref 22–32)
Calcium: 8.5 mg/dL — ABNORMAL LOW (ref 8.9–10.3)
Chloride: 109 mmol/L (ref 101–111)
Creatinine, Ser: 1.38 mg/dL — ABNORMAL HIGH (ref 0.61–1.24)
GFR calc Af Amer: >60 mL/min (ref >60)
GFR calc non Af Amer: 54 mL/min — ABNORMAL LOW (ref >60)
Glucose, Bld: 101 mg/dL — ABNORMAL HIGH (ref 65–99)
Potassium: 3.7 mmol/L (ref 3.5–5.1)
Sodium: 138 mmol/L (ref 135–145)
Total Bilirubin: 0.4 mg/dL (ref 0.3–1.2)
Total Protein: 6 g/dL — ABNORMAL LOW (ref 6.5–8.1)

## 2017-07-10 LAB — HIV ANTIBODY (ROUTINE TESTING W REFLEX): HIV Screen 4th Generation wRfx: NONREACTIVE

## 2017-07-10 LAB — GLUCOSE, CAPILLARY: Glucose-Capillary: 81 mg/dL (ref 65–99)

## 2017-07-10 NOTE — Plan of Care (Deleted)
y

## 2017-07-10 NOTE — Progress Notes (Signed)
*  PRELIMINARY RESULTS* Echocardiogram 2D Echocardiogram has been performed by Alv[facility].  Leavy Cella 07/10/2017, 8:49 AM

## 2017-07-10 NOTE — Progress Notes (Signed)
Patient with unusual affect this AM; very anxious.  I reviewed plan of care/plan and timeline for discharge from hospital multiple times.  Awaiting Echocardiogram and results of same, Cardiology sign off, and Internal Medicine sign off.  Patient disconnected from tele monitor and peripheral IV sites removed as requested so that he could shower prior to leaving.  Patient and family approached the desk at 1048 inquiring about his discharge papers.  I reminded him that I had told him earlier that I expected to discharge him at approximately 1100, and that I was working on his discharge on the computer as we spoke.  He remained standing at the nursing station and sent his family to get the car.  At 1058, he walked away from the station.  At 1104 I carried his completed discharge papers to his room, but he was not found, and apparently had left the hospital.  I will mail papers to the address on file first thing Monday morning.  That address is: [place], [place] [address] Cardiology and Internal Medicine made aware of the above.

## 2017-07-10 NOTE — Progress Notes (Signed)
Progress Note  Patient Name: Brian [name] Date of Encounter: 07/10/2017   Subjective   Some chest pain overnight  Inpatient Medications    Scheduled Meds: . amLODipine  5 mg Oral Daily  . aspirin EC  81 mg Oral Daily  . atorvastatin  40 mg Oral q1800  . busPIRone  15 mg Oral BID  . citalopram  20 mg Oral Daily  . cyclobenzaprine  10 mg Oral TID  . diphenhydrAMINE  25 mg Oral BID  . enoxaparin (LOVENOX) injection  40 mg Subcutaneous Q24H  . famotidine  10 mg Oral QHS  . gabapentin  800 mg Oral TID  . insulin aspart  0-9 Units Subcutaneous TID WC  . losartan  100 mg Oral Daily  . sodium chloride flush  3 mL Intravenous Q12H  . sodium chloride flush  3 mL Intravenous Q12H  . sucralfate  1 g Oral TID PC  . sucralfate  1.5 g Oral QHS  . topiramate  100 mg Oral Daily  . topiramate  150 mg Oral QPM   Continuous Infusions: . sodium chloride    . sodium chloride     PRN Meds: sodium chloride, sodium chloride, acetaminophen **OR** acetaminophen, diazepam, HYDROcodone-acetaminophen, sodium chloride flush, sodium chloride flush   Vital Signs    Vitals:   07/10/17 0412 07/10/17 0519 07/10/17 0657 07/10/17 0713  BP:  (!) 142/89  133/89  Pulse:  (!) 55  63  Resp: 13 11  12   Temp:  97.7 F (36.5 C)  98.1 F (36.7 C)  TempSrc:  Oral  Oral  SpO2:  96%  95%  Weight:   202 lb 6.1 oz (91.8 kg)   Height:        Intake/Output Summary (Last 24 hours) at 07/10/2017 0730 Last data filed at 07/10/2017 0000 Gross per 24 hour  Intake 608.18 ml  Output 1490 ml  Net -881.82 ml   Filed Weights   07/08/17 1735 07/09/17 0500 07/10/17 0657  Weight: 220 lb (99.8 kg) 219 lb 11.2 oz (99.7 kg) 202 lb 6.1 oz (91.8 kg)    Telemetry    NSR  ECG    n/a  Physical Exam   GEN: No acute distress.   Neck: No JVD Cardiac: RRR, no murmurs, rubs, or gallops.  Respiratory: Clear to auscultation bilaterally. GI: Soft, nontender, non-distended  MS: No edema; No deformity. Neuro:   Nonfocal  Psych: Normal affect   Labs    Chemistry Recent Labs  Lab 07/08/17 2006 07/09/17 1938 07/10/17 0323  NA 144  --  138  K 4.2  --  3.7  CL 109  --  109  CO2 24  --  23  GLUCOSE 121*  --  101*  BUN 22*  --  16  CREATININE 1.45* 1.22 1.38*  CALCIUM 9.2  --  8.5*  PROT 7.5  --  6.0*  ALBUMIN 4.4  --  3.4*  AST 24  --  21  ALT 32  --  29  ALKPHOS 67  --  60  BILITOT 0.5  --  0.4  GFRNONAA 51* >60 54*  GFRAA 59* >60 >60  ANIONGAP 11  --  6     Hematology Recent Labs  Lab 07/08/17 2006 07/09/17 1938 07/10/17 0323  WBC 8.4 7.2 6.6  RBC 4.22 4.23 4.17*  HGB 13.7 13.5 13.2  HCT 41.7 40.8 40.0  MCV 98.8 96.5 95.9  MCH 32.5 31.9 31.7  MCHC 32.9 33.1 33.0  RDW  13.3 12.9 12.8  PLT 225 210 200    Cardiac Enzymes Recent Labs  Lab 07/09/17 0128 07/09/17 0515 07/09/17 1034  TROPONINI <0.03 <0.03 <0.03   No results for input(s): TROPIPOC in the last 168 hours.   BNPNo results for input(s): BNP, PROBNP in the last 168 hours.   DDimer  Recent Labs  Lab 07/09/17 0128  DDIMER 0.29     Radiology    Ct Abdomen Pelvis Wo Contrast  Result Date: 07/09/2017 CLINICAL DATA:  Abdominal pain EXAM: CT ABDOMEN AND PELVIS WITHOUT CONTRAST TECHNIQUE: Multidetector CT imaging of the abdomen and pelvis was performed following the standard protocol without IV contrast. COMPARISON:  04/20/2016, 03/13/2015 FINDINGS: Lower chest: Lung bases demonstrate no acute consolidation or pleural effusion. Aortic atherosclerosis. Normal heart size. Hepatobiliary: No focal liver abnormality is seen. Status post cholecystectomy. No biliary dilatation. Pancreas: Unremarkable. No pancreatic ductal dilatation or surrounding inflammatory changes. Spleen: Normal in size without focal abnormality. Adrenals/Urinary Tract: Adrenal glands are within normal limits. No hydronephrosis. Small exophytic cyst upper pole left kidney. Tiny exophytic hypodensity mid right kidney. Bladder unremarkable  Stomach/Bowel: Stomach is within normal limits. Appendix appears normal. No evidence of bowel wall thickening, distention, or inflammatory changes. Sigmoid colon diverticular disease without acute inflammation Vascular/Lymphatic: Aortic atherosclerosis. No enlarged abdominal or pelvic lymph nodes. Reproductive: Slightly enlarged prostate with calcification Other: Negative for free air or free fluid. Tiny fat in the umbilicus Musculoskeletal: Degenerative changes. No acute or suspicious lesion IMPRESSION: 1. No CT evidence for acute intra-abdominal or pelvic abnormality. 2. Sigmoid colon diverticular disease without acute inflammation. Electronically Signed   By: Donavan Foil M.D.   On: 07/09/2017 00:09   Mr Cervical Spine Wo Contrast  Result Date: 07/09/2017 CLINICAL DATA:  Severe neck pain radiating down both arms for 3 weeks. EXAM: MRI CERVICAL SPINE WITHOUT CONTRAST TECHNIQUE: Multiplanar, multisequence MR imaging of the cervical spine was performed. No intravenous contrast was administered. COMPARISON:  Cervical spine CT 03/30/2014 FINDINGS: Alignment: Straightening without subluxation. Vertebrae: No fracture, evidence of discitis, or bone lesion. Cord: Normal signal and morphology. Posterior Fossa, vertebral arteries, paraspinal tissues: There is a nasopharyngeal cyst incidentally seen. Disc levels: C2-3: Small uncovertebral spurs.  No impingement C3-4: Mild disc narrowing and leftward ridging. Patent canal and foramina. C4-5: Spondylosis and degenerative disc narrowing. Asymmetric rightward uncovertebral spurring and C5 foraminal impingement. Right-sided facet spurring contributes to the foraminal stenosis. Patent spinal canal C5-6: Degenerative disc narrowing with endplate and uncovertebral spurring. Uncovertebral spurs cause biforaminal C6 impingement. Ventral subarachnoid space effacement without cord compression. C6-7: Degenerative disc narrowing with endplate and uncovertebral spurring. Left larger  than right uncovertebral spurring and foraminal impingement. Patent spinal canal. C7-T1:Unremarkable. IMPRESSION: 1. No acute finding. 2. Disc degeneration with foraminal impingement on the right at C4-5 and bilaterally at C5-6 and C6-7. No noted progression of degenerative disease when compared to 2015 CT. 3. Diffusely patent spinal canal. Electronically Signed   By: Monte Fantasia M.D.   On: 07/09/2017 07:46   Dg Chest Portable 1 View  Result Date: 07/09/2017 CLINICAL DATA:  Difficulty breathing since yesterday afternoon. Shortness of breath. History of diabetes, hypertension, left ventricular hypertrophy, vena caval filter. Quit smoking this Sunday. EXAM: PORTABLE CHEST 1 VIEW COMPARISON:  05/22/2013 FINDINGS: Normal heart size and pulmonary vascularity. Lungs are clear. No blunting of costophrenic angles. No pneumothorax. Mediastinal contours appear intact. Old right rib fractures. Degenerative changes in the shoulders. Calcification of the aorta. IMPRESSION: No evidence of active pulmonary disease.  Aortic atherosclerosis.  Electronically Signed   By: Lucienne Capers M.D.   On: 07/09/2017 01:41    Cardiac Studies     Patient Profile     IAIN SAWCHUK is a 60 y.o. male with a history of hypertension, type 2 diabetes mellitus, and hyperlipidemia who is being seen today for the evaluation of recent onset shortness of breath and subsequent chest tightness at the request of Dr. Carles Collet.    Assessment & Plan    1. Chest pain - D-dimer negative, troponin negative x3, echo pending - cath yesterday with normal coronaries. Cath sites look good this AM - no evidence of cardiac etiology for his chest pain. We will f/u echo results, otherwise no additional plans for cardiac workup at this time - atypical symptoms this AM, lasting several hours, better with passing gas.     For questions or updates, please contact [place] Please consult [email or web address] for contact info under Cardiology/STEMI.       Merrily Pew, MD  07/10/2017, 7:30 AM
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
## 2017-07-12 ENCOUNTER — Encounter (HOSPITAL_COMMUNITY): Payer: Self-pay | Admitting: Cardiology

## 2017-07-14 ENCOUNTER — Other Ambulatory Visit (HOSPITAL_COMMUNITY): Payer: Self-pay | Admitting: Emergency Medicine

## 2017-07-14 DIAGNOSIS — R0602 Shortness of breath: Secondary | ICD-10-CM

## 2017-07-14 DIAGNOSIS — R079 Chest pain, unspecified: Secondary | ICD-10-CM

## 2017-07-14 DIAGNOSIS — S20219D Contusion of unspecified front wall of thorax, subsequent encounter: Secondary | ICD-10-CM

## 2017-07-15 ENCOUNTER — Other Ambulatory Visit: Payer: Self-pay

## 2017-07-15 ENCOUNTER — Ambulatory Visit (HOSPITAL_COMMUNITY)
Admission: RE | Admit: 2017-07-15 | Discharge: 2017-07-15 | Disposition: A | Discharge status: Home / Self Care | Payer: Medicare HMO | Source: Ambulatory Visit | Attending: Emergency Medicine | Admitting: Emergency Medicine

## 2017-07-15 ENCOUNTER — Emergency Department (HOSPITAL_COMMUNITY): Payer: Medicare HMO

## 2017-07-15 ENCOUNTER — Encounter (HOSPITAL_COMMUNITY): Payer: Self-pay | Admitting: *Deleted

## 2017-07-15 ENCOUNTER — Emergency Department (HOSPITAL_COMMUNITY)
Admission: EM | Admit: 2017-07-15 | Discharge: 2017-07-15 | Disposition: A | Discharge status: Home / Self Care | Payer: Medicare HMO | Attending: Emergency Medicine | Admitting: Emergency Medicine

## 2017-07-15 DIAGNOSIS — E119 Type 2 diabetes mellitus without complications: Secondary | ICD-10-CM | POA: Insufficient documentation

## 2017-07-15 DIAGNOSIS — S20219D Contusion of unspecified front wall of thorax, subsequent encounter: Secondary | ICD-10-CM | POA: Insufficient documentation

## 2017-07-15 DIAGNOSIS — Z7984 Long term (current) use of oral hypoglycemic drugs: Secondary | ICD-10-CM | POA: Insufficient documentation

## 2017-07-15 DIAGNOSIS — Z7982 Long term (current) use of aspirin: Secondary | ICD-10-CM | POA: Diagnosis not present

## 2017-07-15 DIAGNOSIS — I700 Atherosclerosis of aorta: Secondary | ICD-10-CM

## 2017-07-15 DIAGNOSIS — F17210 Nicotine dependence, cigarettes, uncomplicated: Secondary | ICD-10-CM | POA: Insufficient documentation

## 2017-07-15 DIAGNOSIS — Z79899 Other long term (current) drug therapy: Secondary | ICD-10-CM | POA: Insufficient documentation

## 2017-07-15 DIAGNOSIS — R0602 Shortness of breath: Secondary | ICD-10-CM

## 2017-07-15 DIAGNOSIS — R42 Dizziness and giddiness: Secondary | ICD-10-CM | POA: Diagnosis not present

## 2017-07-15 DIAGNOSIS — I10 Essential (primary) hypertension: Secondary | ICD-10-CM | POA: Insufficient documentation

## 2017-07-15 DIAGNOSIS — R079 Chest pain, unspecified: Secondary | ICD-10-CM

## 2017-07-15 DIAGNOSIS — R0789 Other chest pain: Secondary | ICD-10-CM | POA: Insufficient documentation

## 2017-07-15 DIAGNOSIS — R55 Syncope and collapse: Secondary | ICD-10-CM | POA: Insufficient documentation

## 2017-07-15 HISTORY — DX: Other specified postprocedural states: Z98.890

## 2017-07-15 LAB — BASIC METABOLIC PANEL
Anion gap: 7 (ref 5–15)
BUN: 15 mg/dL (ref 6–20)
CO2: 25 mmol/L (ref 22–32)
Calcium: 8.7 mg/dL — ABNORMAL LOW (ref 8.9–10.3)
Chloride: 108 mmol/L (ref 101–111)
Creatinine, Ser: 1.45 mg/dL — ABNORMAL HIGH (ref 0.61–1.24)
GFR calc Af Amer: 59 mL/min — ABNORMAL LOW (ref >60)
GFR calc non Af Amer: 51 mL/min — ABNORMAL LOW (ref >60)
Glucose, Bld: 134 mg/dL — ABNORMAL HIGH (ref 65–99)
Potassium: 3.8 mmol/L (ref 3.5–5.1)
Sodium: 140 mmol/L (ref 135–145)

## 2017-07-15 LAB — CBC
HCT: 40.6 % (ref 39.0–52.0)
Hemoglobin: 13.3 g/dL (ref 13.0–17.0)
MCH: 32.7 pg (ref 26.0–34.0)
MCHC: 32.8 g/dL (ref 30.0–36.0)
MCV: 99.8 fL (ref 78.0–100.0)
Platelets: 214 10*3/uL (ref 150–400)
RBC: 4.07 MIL/uL — ABNORMAL LOW (ref 4.22–5.81)
RDW: 13.4 % (ref 11.5–15.5)
WBC: 7.8 10*3/uL (ref 4.0–10.5)

## 2017-07-15 LAB — TROPONIN I: Troponin I: <0.03 ng/mL (ref <0.03)

## 2017-07-15 LAB — I-STAT TROPONIN, ED: Troponin i, poc: 0 ng/mL (ref 0.00–0.08)

## 2017-07-15 MED ORDER — IOPAMIDOL (ISOVUE-370) INJECTION 76%
100.0000 mL | Freq: Once | INTRAVENOUS | Status: AC | PRN
  Administered 2017-07-15: 100 mL via INTRAVENOUS

## 2017-07-15 NOTE — ED Notes (Signed)
CRITICAL VALUE ALERT  Critical Value:  Trop 0.03  Date & Time Notied:  07/15/17  Provider Notified: [email or web address]  Orders Received/Actions taken: none

## 2017-07-15 NOTE — ED Triage Notes (Signed)
Pt was at [facility] today getting a CT Angio to evaluate for possible PE. Upon leaving pt started to feel lightheadedness and heaviness in the left side of the chest. Denies SOB, n/v, radiation of pain.

## 2017-07-15 NOTE — Progress Notes (Signed)
Upon being escorted out to the CT scanner to the lobby, the patient became light headed and staggered going down the hall.  I sat the patient down and the CT RN Remo Lipps came and took the patient's blood pressure and O2.  BP was approximately 144/9?Marland Kitchen The patient kept saying he would be ok and was going to ddrive himself home. Brian [name] was here alone. I asked Dr. Register (Radiologist) to see the patient and speak to him. Dr. Register told the patient he absolutely needed to be checked out in the ED. I took the patient to the ED in a Wheelchair and he was checked in.
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
## 2017-07-15 NOTE — Discharge Instructions (Signed)
As discussed, your evaluation today has been largely reassuring.  But, it is important that you monitor your condition carefully, and do not hesitate to return to the ED if you develop new, or concerning changes in your condition. ? ?Otherwise, please follow-up with your physician for appropriate ongoing care. ? ?

## 2017-08-17 ENCOUNTER — Other Ambulatory Visit: Payer: Self-pay | Admitting: Neurology

## 2017-08-17 DIAGNOSIS — M5412 Radiculopathy, cervical region: Secondary | ICD-10-CM

## 2017-08-27 ENCOUNTER — Other Ambulatory Visit: Payer: Self-pay | Admitting: Nurse Practitioner

## 2017-09-14 ENCOUNTER — Ambulatory Visit: Payer: Medicare HMO | Admitting: Gastroenterology

## 2017-09-14 ENCOUNTER — Inpatient Hospital Stay
Admission: RE | Admit: 2017-09-14 | Discharge: 2017-09-14 | Disposition: A | Discharge status: Home / Self Care | Payer: Medicare HMO | Source: Ambulatory Visit | Attending: Neurology | Admitting: Neurology

## 2017-09-23 ENCOUNTER — Ambulatory Visit
Admission: RE | Admit: 2017-09-23 | Discharge: 2017-09-23 | Disposition: A | Discharge status: Home / Self Care | Payer: Medicare HMO | Source: Ambulatory Visit | Attending: Neurology | Admitting: Neurology

## 2017-09-23 DIAGNOSIS — M5412 Radiculopathy, cervical region: Secondary | ICD-10-CM

## 2017-09-23 MED ORDER — TRIAMCINOLONE ACETONIDE 40 MG/ML IJ SUSP (RADIOLOGY)
60.0000 mg | Freq: Once | INTRAMUSCULAR | Status: AC
  Administered 2017-09-23: 60 mg via EPIDURAL

## 2017-09-23 MED ORDER — IOPAMIDOL (ISOVUE-M 300) INJECTION 61%
1.0000 mL | Freq: Once | INTRAMUSCULAR | Status: AC | PRN
  Administered 2017-09-23: 1 mL via EPIDURAL

## 2017-09-23 NOTE — Discharge Instructions (Signed)

## 2017-10-06 ENCOUNTER — Ambulatory Visit: Payer: Medicare HMO | Admitting: Gastroenterology

## 2017-10-06 ENCOUNTER — Encounter: Payer: Self-pay | Admitting: Gastroenterology

## 2017-10-06 VITALS — BP 124/83 | HR 96 | Temp 98.5°F | Ht 72.0 in | Wt 223.6 lb

## 2017-10-06 DIAGNOSIS — R1032 Left lower quadrant pain: Secondary | ICD-10-CM

## 2017-10-06 DIAGNOSIS — K5903 Drug induced constipation: Secondary | ICD-10-CM | POA: Diagnosis not present

## 2017-10-06 MED ORDER — NALDEMEDINE TOSYLATE 0.2 MG PO TABS: 0.2000 mg | ORAL_TABLET | Freq: Every day | ORAL | 5 refills | Status: DC

## 2017-10-06 NOTE — Patient Instructions (Signed)
1. Trial of Symproic take one daily. Samples provided as well as RX sent to your pharmacy.  2. Call in two weeks with progress report.
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
## 2017-10-07 NOTE — Progress Notes (Signed)
cc'ed to pcp °

## 2017-10-07 NOTE — Assessment & Plan Note (Signed)
Chronic left sided abd pain with extensive work up previously with colonoscopy and noncontrast CT more recently. Constipation in setting of opioid use. Would advise optimizing management of constipation prior to further investigation of his LLQ pain. Patient agreeable. Trial of symproic. Progress report in two weeks.

## 2017-10-15 ENCOUNTER — Telehealth: Payer: Self-pay

## 2017-10-15 NOTE — Telephone Encounter (Signed)
PA for Sympoic 0.2mg  tab was started. Received a letter from [facility] asking why 1) lactulose oral solution and 2) Relistor tablet have not worked for this pt or would cause bad side effects. Pt has failed Movantik years ago, Linzess, Amitiza and has taken Colace and Miralax before.

## 2017-10-18 NOTE — Telephone Encounter (Signed)
Received a denial for coverage of Symproic. An appeal can be made if wanted. Pt hasn't tried and failed Lactuolse oral solution or Relistor as wanted by [employer]

## 2017-10-18 NOTE — Telephone Encounter (Signed)
Routing to correct provider LSL

## 2017-10-19 MED ORDER — METHYLNALTREXONE BROMIDE 150 MG PO TABS: 150.0000 mg | ORAL_TABLET | Freq: Every day | ORAL | 1 refills | Status: DC

## 2017-10-19 NOTE — Telephone Encounter (Signed)
Can try Relistor 150mg  oral with water 30 minutes prior to breakfast. Cannot increase dose given patient's last creatinine.   RX sent.   To make notation in the chart: lactulose is not FDA approved for opioid induced constipation.

## 2017-10-19 NOTE — Telephone Encounter (Signed)
Pt notified and will let us know if he has trouble getting rx.

## 2017-10-19 NOTE — Addendum Note (Signed)
Addended by: Mahala Menghini on: 10/19/2017 01:49 PM   Modules accepted: Orders

## 2017-11-03 ENCOUNTER — Other Ambulatory Visit: Payer: Self-pay | Admitting: Neurology

## 2017-11-03 DIAGNOSIS — M545 Low back pain: Principal | ICD-10-CM

## 2017-11-03 DIAGNOSIS — G8929 Other chronic pain: Secondary | ICD-10-CM

## 2017-12-31 ENCOUNTER — Other Ambulatory Visit (HOSPITAL_COMMUNITY): Payer: Self-pay | Admitting: Neurology

## 2017-12-31 ENCOUNTER — Ambulatory Visit (HOSPITAL_COMMUNITY)
Admission: RE | Admit: 2017-12-31 | Discharge: 2017-12-31 | Disposition: A | Discharge status: Home / Self Care | Payer: Medicare HMO | Source: Ambulatory Visit | Attending: Neurology | Admitting: Neurology

## 2017-12-31 DIAGNOSIS — W19XXXD Unspecified fall, subsequent encounter: Secondary | ICD-10-CM

## 2017-12-31 DIAGNOSIS — R937 Abnormal findings on diagnostic imaging of other parts of musculoskeletal system: Secondary | ICD-10-CM | POA: Diagnosis not present

## 2017-12-31 DIAGNOSIS — R609 Edema, unspecified: Secondary | ICD-10-CM

## 2017-12-31 DIAGNOSIS — M25561 Pain in right knee: Secondary | ICD-10-CM

## 2017-12-31 DIAGNOSIS — W19XXXA Unspecified fall, initial encounter: Secondary | ICD-10-CM | POA: Insufficient documentation

## 2018-02-01 ENCOUNTER — Encounter: Payer: Self-pay | Admitting: Orthopaedic Surgery

## 2018-02-01 ENCOUNTER — Ambulatory Visit: Payer: Medicare HMO | Admitting: Orthopaedic Surgery

## 2018-02-01 VITALS — BP 139/81 | HR 79 | Temp 97.6°F | Ht 72.0 in | Wt 219.0 lb

## 2018-02-01 DIAGNOSIS — G8929 Other chronic pain: Secondary | ICD-10-CM

## 2018-02-01 DIAGNOSIS — M25561 Pain in right knee: Secondary | ICD-10-CM

## 2018-02-01 DIAGNOSIS — M2341 Loose body in knee, right knee: Secondary | ICD-10-CM

## 2018-02-01 NOTE — Patient Instructions (Signed)
..  Your MRI has been ordered.  We will contact your insurance company for approval. After the authorization is received the MRI and a return appointment will be scheduled with you by phone.  If you do not hear from us within 5 business days please call [phone] and ask for the pre-authorization representative in our office.  
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
## 2018-02-01 NOTE — Progress Notes (Signed)
Mri

## 2018-02-07 ENCOUNTER — Encounter: Payer: Self-pay | Admitting: *Deleted

## 2018-02-07 ENCOUNTER — Ambulatory Visit (HOSPITAL_COMMUNITY)
Admission: RE | Admit: 2018-02-07 | Discharge: 2018-02-07 | Disposition: A | Discharge status: Home / Self Care | Payer: Medicare HMO | Source: Ambulatory Visit | Attending: Orthopaedic Surgery | Admitting: Orthopaedic Surgery

## 2018-02-07 DIAGNOSIS — M1711 Unilateral primary osteoarthritis, right knee: Secondary | ICD-10-CM | POA: Insufficient documentation

## 2018-02-07 DIAGNOSIS — M7121 Synovial cyst of popliteal space [Baker], right knee: Secondary | ICD-10-CM | POA: Insufficient documentation

## 2018-02-07 DIAGNOSIS — G8929 Other chronic pain: Secondary | ICD-10-CM | POA: Diagnosis not present

## 2018-02-07 DIAGNOSIS — M2341 Loose body in knee, right knee: Secondary | ICD-10-CM | POA: Insufficient documentation

## 2018-02-07 DIAGNOSIS — M25561 Pain in right knee: Secondary | ICD-10-CM | POA: Diagnosis present

## 2018-02-07 NOTE — Patient Instructions (Signed)
Patient came into the office requesting his most recent OV notes.  Spoke with CM and the front desk has left for the day. Have patient show ID to verify it is correct patient requesting these.  Spoke with patient and verified correct patient. Last OV note was printed and given to patient

## 2018-02-09 ENCOUNTER — Ambulatory Visit: Payer: Medicare HMO | Admitting: Orthopaedic Surgery

## 2018-02-09 ENCOUNTER — Encounter: Payer: Self-pay | Admitting: Orthopaedic Surgery

## 2018-02-09 VITALS — BP 136/83 | HR 82 | Ht 72.0 in | Wt 216.0 lb

## 2018-02-09 DIAGNOSIS — M25561 Pain in right knee: Secondary | ICD-10-CM | POA: Diagnosis not present

## 2018-02-09 DIAGNOSIS — G8929 Other chronic pain: Secondary | ICD-10-CM | POA: Diagnosis not present
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
## 2018-02-09 NOTE — Patient Instructions (Signed)
Steps to Quit Smoking Smoking tobacco can be bad for your health. It can also affect almost every organ in your body. Smoking puts you and people around you at risk for many serious [place]-lasting (chronic) diseases. Quitting smoking is hard, but it is one of the best things that you can do for your health. It is never too late to quit. What are the benefits of quitting smoking? When you quit smoking, you lower your risk for getting serious diseases and conditions. They can include:  Lung cancer or lung disease.  Heart disease.  Stroke.  Heart attack.  Not being able to have children (infertility).  Weak bones (osteoporosis) and broken bones (fractures).  If you have coughing, wheezing, and shortness of breath, those symptoms may get better when you quit. You may also get sick less often. If you are pregnant, quitting smoking can help to lower your chances of having a baby of low birth weight. What can I do to help me quit smoking? Talk with your doctor about what can help you quit smoking. Some things you can do (strategies) include:  Quitting smoking totally, instead of slowly cutting back how much you smoke over a period of time.  Going to in-person counseling. You are more likely to quit if you go to many counseling sessions.  Using resources and support systems, such as: ? Online chats with a counselor. ? Phone quitlines. ? Printed self-help materials. ? Support groups or group counseling. ? Text messaging programs. ? Mobile phone apps or applications.  Taking medicines. Some of these medicines may have nicotine in them. If you are pregnant or breastfeeding, do not take any medicines to quit smoking unless your doctor says it is okay. Talk with your doctor about counseling or other things that can help you.  Talk with your doctor about using more than one strategy at the same time, such as taking medicines while you are also going to in-person counseling. This can help make  quitting easier. What things can I do to make it easier to quit? Quitting smoking might feel very hard at first, but there is a lot that you can do to make it easier. Take these steps:  Talk to your family and friends. Ask them to support and encourage you.  Call phone quitlines, reach out to support groups, or work with a counselor.  Ask people who smoke to not smoke around you.  Avoid places that make you want (trigger) to smoke, such as: ? Bars. ? Parties. ? Smoke-break areas at work.  Spend time with people who do not smoke.  Lower the stress in your life. Stress can make you want to smoke. Try these things to help your stress: ? Getting regular exercise. ? Deep-breathing exercises. ? Yoga. ? Meditating. ? Doing a body scan. To do this, close your eyes, focus on one area of your body at a time from head to toe, and notice which parts of your body are tense. Try to relax the muscles in those areas.  Download or buy apps on your mobile phone or tablet that can help you stick to your quit plan. There are many free apps, such as QuitGuide from the CDC (Centers for Disease Control and Prevention). You can find more support from smokefree.gov and other websites.  This information is not intended to replace advice given to you by your health care provider. Make sure you discuss any questions you have with your health care provider. 

## 2018-02-15 ENCOUNTER — Ambulatory Visit: Payer: Medicare HMO | Admitting: Orthopaedic Surgery

## 2018-02-16 ENCOUNTER — Encounter: Payer: Self-pay | Admitting: Orthopaedic Surgery

## 2018-02-16 ENCOUNTER — Ambulatory Visit (INDEPENDENT_AMBULATORY_CARE_PROVIDER_SITE_OTHER): Payer: Self-pay | Admitting: Orthopaedic Surgery

## 2018-02-16 VITALS — BP 129/74 | HR 83 | Ht 72.0 in | Wt 216.0 lb

## 2018-02-16 DIAGNOSIS — G8929 Other chronic pain: Secondary | ICD-10-CM

## 2018-02-16 DIAGNOSIS — M5441 Lumbago with sciatica, right side: Secondary | ICD-10-CM

## 2018-02-16 NOTE — Patient Instructions (Signed)
Steps to Quit Smoking Smoking tobacco can be bad for your health. It can also affect almost every organ in your body. Smoking puts you and people around you at risk for many serious [place]-lasting (chronic) diseases. Quitting smoking is hard, but it is one of the best things that you can do for your health. It is never too late to quit. What are the benefits of quitting smoking? When you quit smoking, you lower your risk for getting serious diseases and conditions. They can include:  Lung cancer or lung disease.  Heart disease.  Stroke.  Heart attack.  Not being able to have children (infertility).  Weak bones (osteoporosis) and broken bones (fractures).  If you have coughing, wheezing, and shortness of breath, those symptoms may get better when you quit. You may also get sick less often. If you are pregnant, quitting smoking can help to lower your chances of having a baby of low birth weight. What can I do to help me quit smoking? Talk with your doctor about what can help you quit smoking. Some things you can do (strategies) include:  Quitting smoking totally, instead of slowly cutting back how much you smoke over a period of time.  Going to in-person counseling. You are more likely to quit if you go to many counseling sessions.  Using resources and support systems, such as: ? Online chats with a counselor. ? Phone quitlines. ? Printed self-help materials. ? Support groups or group counseling. ? Text messaging programs. ? Mobile phone apps or applications.  Taking medicines. Some of these medicines may have nicotine in them. If you are pregnant or breastfeeding, do not take any medicines to quit smoking unless your doctor says it is okay. Talk with your doctor about counseling or other things that can help you.  Talk with your doctor about using more than one strategy at the same time, such as taking medicines while you are also going to in-person counseling. This can help make  quitting easier. What things can I do to make it easier to quit? Quitting smoking might feel very hard at first, but there is a lot that you can do to make it easier. Take these steps:  Talk to your family and friends. Ask them to support and encourage you.  Call phone quitlines, reach out to support groups, or work with a counselor.  Ask people who smoke to not smoke around you.  Avoid places that make you want (trigger) to smoke, such as: ? Bars. ? Parties. ? Smoke-break areas at work.  Spend time with people who do not smoke.  Lower the stress in your life. Stress can make you want to smoke. Try these things to help your stress: ? Getting regular exercise. ? Deep-breathing exercises. ? Yoga. ? Meditating. ? Doing a body scan. To do this, close your eyes, focus on one area of your body at a time from head to toe, and notice which parts of your body are tense. Try to relax the muscles in those areas.  Download or buy apps on your mobile phone or tablet that can help you stick to your quit plan. There are many free apps, such as QuitGuide from the CDC (Centers for Disease Control and Prevention). You can find more support from smokefree.gov and other websites.  This information is not intended to replace advice given to you by your health care provider. Make sure you discuss any questions you have with your health care provider. Document Released: 06/13/2009 Document   Revised: 04/14/2016 Document Reviewed: 01/01/2015 Elsevier Interactive Patient Education  2018 Elsevier Inc.  

## 2018-02-16 NOTE — Progress Notes (Signed)
He has been seen for his right knee.  He has lower back pain and I have been waiting for notes to review.  His notes came in and he is being actively treated for his back by Dr. Merlene Laughter and had epidural injection 11-26-17.  Dr. Merlene Laughter is managing his chronic pain as well.  I told the patient is it best that he continue care with Dr. Merlene Laughter for his back.  I do not have anything further to add. He understands.  I will see him for his right knee in one month.  Electronically Signed Sanjuana Kava, MD 6/19/20199:26 AM

## 2018-03-07 ENCOUNTER — Encounter: Payer: Self-pay | Admitting: Internal Medicine

## 2018-03-10 ENCOUNTER — Telehealth: Payer: Self-pay | Admitting: Internal Medicine

## 2018-03-10 MED ORDER — ONDANSETRON HCL 8 MG PO TABS
ORAL_TABLET | ORAL | 0 refills | Status: DC
Start: 2018-03-10 — End: 2018-04-08

## 2018-03-10 NOTE — Telephone Encounter (Signed)
ONDANSETRON 8MG  PATIENT SAID HE IS GOING TO BE OUT OF HIS MEDICATION BEFORE HIS MAIL ORDER COMES IN.  WANTS TO KNOW IF WE COULD CALL A WEEKS PRESCRIPTION INTO [facility] IN [place] [address].  THAT WILL GIVE HIM ENOUGH TO LAST UNTIL THE [employer] ORDER ARRIVES.  PLEASE CALL HIM [phone]

## 2018-03-10 NOTE — Telephone Encounter (Signed)
Routing to [facility] refill box 

## 2018-03-10 NOTE — Addendum Note (Signed)
Addended by: Annitta Needs on: 03/10/2018 12:07 PM   Modules accepted: Orders

## 2018-03-10 NOTE — Telephone Encounter (Signed)
Done

## 2018-03-14 ENCOUNTER — Other Ambulatory Visit: Payer: Self-pay | Admitting: Neurology

## 2018-03-14 DIAGNOSIS — M5412 Radiculopathy, cervical region: Secondary | ICD-10-CM

## 2018-03-16 ENCOUNTER — Ambulatory Visit: Payer: Medicare HMO | Admitting: Orthopaedic Surgery

## 2018-03-28 ENCOUNTER — Ambulatory Visit
Admission: RE | Admit: 2018-03-28 | Discharge: 2018-03-28 | Disposition: A | Discharge status: Home / Self Care | Payer: Medicare HMO | Source: Ambulatory Visit | Attending: Neurology | Admitting: Neurology

## 2018-03-28 DIAGNOSIS — M5412 Radiculopathy, cervical region: Secondary | ICD-10-CM

## 2018-03-28 MED ORDER — IOPAMIDOL (ISOVUE-M 300) INJECTION 61%
1.0000 mL | Freq: Once | INTRAMUSCULAR | Status: AC | PRN
  Administered 2018-03-28: 1 mL via EPIDURAL

## 2018-03-28 MED ORDER — TRIAMCINOLONE ACETONIDE 40 MG/ML IJ SUSP (RADIOLOGY)
60.0000 mg | Freq: Once | INTRAMUSCULAR | Status: AC
  Administered 2018-03-28: 60 mg via EPIDURAL

## 2018-03-28 NOTE — Discharge Instructions (Signed)

## 2018-04-08 ENCOUNTER — Other Ambulatory Visit: Payer: Self-pay | Admitting: Nurse Practitioner

## 2018-04-13 ENCOUNTER — Ambulatory Visit: Payer: Medicare HMO | Admitting: Gastroenterology

## 2018-04-13 ENCOUNTER — Telehealth: Payer: Self-pay | Admitting: *Deleted

## 2018-04-13 ENCOUNTER — Other Ambulatory Visit: Payer: Self-pay | Admitting: *Deleted

## 2018-04-13 ENCOUNTER — Encounter: Payer: Self-pay | Admitting: *Deleted

## 2018-04-13 ENCOUNTER — Encounter: Payer: Self-pay | Admitting: Gastroenterology

## 2018-04-13 VITALS — BP 131/80 | HR 93 | Temp 98.7°F | Ht 72.0 in | Wt 215.0 lb

## 2018-04-13 DIAGNOSIS — K219 Gastro-esophageal reflux disease without esophagitis: Secondary | ICD-10-CM | POA: Diagnosis not present

## 2018-04-13 DIAGNOSIS — R1310 Dysphagia, unspecified: Secondary | ICD-10-CM

## 2018-04-13 MED ORDER — PROMETHAZINE HCL 12.5 MG PO TABS: 12.5000 mg | ORAL_TABLET | Freq: Four times a day (QID) | ORAL | 0 refills | Status: DC | PRN

## 2018-04-13 NOTE — Patient Instructions (Addendum)
Continue Protonix for now. When we get samples of Dexilant, we can start this. We will call you when we get some.   We have arranged an upper endoscopy with dilation by Dr. Gala Romney in the near future.  I have sent in phenergan for nausea that you can take as needed every 8 hours. This can cause drowsiness, confusion. Do not drive while taking this.   We will see you in 6 months regardless!  It was a pleasure to see you today. I strive to create trusting relationships with patients to provide genuine, compassionate, and quality care. I value your feedback. If you receive a survey regarding your visit,  I greatly appreciate you taking time to fill this out.   Annitta Needs, PhD, ANP-BC [facility]
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
## 2018-04-13 NOTE — Telephone Encounter (Signed)
Per AB: no metformin the morning of the procedure. I hand wrote this on patient instructions.
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
## 2018-04-13 NOTE — Telephone Encounter (Signed)
Pre-op scheduled for 04/19/18 at 10:00am. Patient is aware of appt details.

## 2018-04-14 ENCOUNTER — Telehealth: Payer: Self-pay

## 2018-04-14 NOTE — Telephone Encounter (Signed)
Lmom, pt is aware that Dexilant samples are ready for pickup as directed per pts OV 04/13/18.

## 2018-04-15 NOTE — Patient Instructions (Signed)
Brian [name]  04/15/2018     @PREFPERIOPPHARMACY @   Your procedure is scheduled on  04/25/2018   Report to [facility] at  700   A.M.  Call this number if you have problems the morning of surgery:  [phone]   Remember:  Do not eat or drink after midnight.  You may drink clear liquids until ( follow the instructions given to you).  Clear liquids allowed are:                    Water, Juice (non-citric and without pulp), Carbonated beverages, Clear Tea, Black Coffee only, Plain Jell-O only, Gatorade and Plain Popsicles only    Take these medicines the morning of surgery with A SIP OF WATER  Amitriptyline, amlodipine, buspar, celexa,valium ( if needed), cumbalta, gabapentin, dilaudid (if needed), losartan, zofran or phenergan ( if needed),protonix, maxalt ( if needed), topamax.    Do not wear jewelry, make-up or nail polish.  Do not wear lotions, powders, or perfumes, or deodorant.  Do not shave 48 hours prior to surgery.  Men may shave face and neck.  Do not bring valuables to the hospital.  [facility] is not responsible for any belongings or valuables.  Contacts, dentures or bridgework may not be worn into surgery.  Leave your suitcase in the car.  After surgery it may be brought to your room.  For patients admitted to the hospital, discharge time will be determined by your treatment team.  Patients discharged the day of surgery will not be allowed to drive home.   Name and phone number of your driver:   family Special instructions:  Follow the prep instructions given to you by Dr Roseanne Kaufman office.  Please read over the following fact sheets that you were given. Anesthesia Post-op Instructions and Care and Recovery After Surgery       Esophagogastroduodenoscopy Esophagogastroduodenoscopy (EGD) is a procedure to examine the lining of the esophagus, stomach, and first part of the small intestine (duodenum). This procedure is done to check for problems  such as inflammation, bleeding, ulcers, or growths. During this procedure, a Graf, flexible, lighted tube with a camera attached (endoscope) is inserted down the throat. Tell a health care provider about:  Any allergies you have.  All medicines you are taking, including vitamins, herbs, eye drops, creams, and over-the-counter medicines.  Any problems you or family members have had with anesthetic medicines.  Any blood disorders you have.  Any surgeries you have had.  Any medical conditions you have.  Whether you are pregnant or may be pregnant. What are the risks? Generally, this is a safe procedure. However, problems may occur, including:  Infection.  Bleeding.  A tear (perforation) in the esophagus, stomach, or duodenum.  Trouble breathing.  Excessive sweating.  Spasms of the larynx.  A slowed heartbeat.  Low blood pressure.  What happens before the procedure?  Follow instructions from your health care provider about eating or drinking restrictions.  Ask your health care provider about: ? Changing or stopping your regular medicines. This is especially important if you are taking diabetes medicines or blood thinners. ? Taking medicines such as aspirin and ibuprofen. These medicines can thin your blood. Do not take these medicines before your procedure if your health care provider instructs you not to.  Plan to have someone take you home after the procedure.  If you wear dentures, be ready to remove  them before the procedure. What happens during the procedure?  To reduce your risk of infection, your health care team will wash or sanitize their hands.  An IV tube will be put in a vein in your hand or arm. You will get medicines and fluids through this tube.  You will be given one or more of the following: ? A medicine to help you relax (sedative). ? A medicine to numb the area (local anesthetic). This medicine may be sprayed into your throat. It will make you  feel more comfortable and keep you from gagging or coughing during the procedure. ? A medicine for pain.  A mouth guard may be placed in your mouth to protect your teeth and to keep you from biting on the endoscope.  You will be asked to lie on your left side.  The endoscope will be lowered down your throat into your esophagus, stomach, and duodenum.  Air will be put into the endoscope. This will help your health care provider see better.  The lining of your esophagus, stomach, and duodenum will be examined.  Your health care provider may: ? Take a tissue sample so it can be looked at in a lab (biopsy). ? Remove growths. ? Remove objects (foreign bodies) that are stuck. ? Treat any bleeding with medicines or other devices that stop tissue from bleeding. ? Widen (dilate) or stretch narrowed areas of your esophagus and stomach.  The endoscope will be taken out. The procedure may vary among health care providers and hospitals. What happens after the procedure?  Your blood pressure, heart rate, breathing rate, and blood oxygen level will be monitored often until the medicines you were given have worn off.  Do not eat or drink anything until the numbing medicine has worn off and your gag reflex has returned. This information is not intended to replace advice given to you by your health care provider. Make sure you discuss any questions you have with your health care provider. Document Released: 12/18/2004 Document Revised: 01/23/2016 Document Reviewed: 07/11/2015 Elsevier Interactive Patient Education  2018 [employer]. Esophagogastroduodenoscopy, Care After Refer to this sheet in the next few weeks. These instructions provide you with information about caring for yourself after your procedure. Your health care provider may also give you more specific instructions. Your treatment has been planned according to current medical practices, but problems sometimes occur. Call your health care  provider if you have any problems or questions after your procedure. What can I expect after the procedure? After the procedure, it is common to have:  A sore throat.  Nausea.  Bloating.  Dizziness.  Fatigue.  Follow these instructions at home:  Do not eat or drink anything until the numbing medicine (local anesthetic) has worn off and your gag reflex has returned. You will know that the local anesthetic has worn off when you can swallow comfortably.  Do not drive for 24 hours if you received a medicine to help you relax (sedative).  If your health care provider took a tissue sample for testing during the procedure, make sure to get your test results. This is your responsibility. Ask your health care provider or the department performing the test when your results will be ready.  Keep all follow-up visits as told by your health care provider. This is important. Contact a health care provider if:  You cannot stop coughing.  You are not urinating.  You are urinating less than usual. Get help right away if:  You have  trouble swallowing.  You cannot eat or drink.  You have throat or chest pain that gets worse.  You are dizzy or light-headed.  You faint.  You have nausea or vomiting.  You have chills.  You have a fever.  You have severe abdominal pain.  You have black, tarry, or bloody stools. This information is not intended to replace advice given to you by your health care provider. Make sure you discuss any questions you have with your health care provider. Document Released: 08/03/2012 Document Revised: 01/23/2016 Document Reviewed: 07/11/2015 Elsevier Interactive Patient Education  2018 [employer].  Esophageal Dilatation Esophageal dilatation is a procedure to open a blocked or narrowed part of the esophagus. The esophagus is the Prange tube in your throat that carries food and liquid from your mouth to your stomach. The procedure is also called esophageal  dilation. You may need this procedure if you have a buildup of scar tissue in your esophagus that makes it difficult, painful, or even impossible to swallow. This can be caused by gastroesophageal reflux disease (GERD). In rare cases, people need this procedure because they have cancer of the esophagus or a problem with the way food moves through the esophagus. Sometimes you may need to have another dilatation to enlarge the opening of the esophagus gradually. Tell a health care provider about:  Any allergies you have.  All medicines you are taking, including vitamins, herbs, eye drops, creams, and over-the-counter medicines.  Any problems you or family members have had with anesthetic medicines.  Any blood disorders you have.  Any surgeries you have had.  Any medical conditions you have.  Any antibiotic medicines you are required to take before dental procedures. What are the risks? Generally, this is a safe procedure. However, problems can occur and include:  Bleeding from a tear in the lining of the esophagus.  A hole (perforation) in the esophagus.  What happens before the procedure?  Do not eat or drink anything after midnight on the night before the procedure or as directed by your health care provider.  Ask your health care provider about changing or stopping your regular medicines. This is especially important if you are taking diabetes medicines or blood thinners.  Plan to have someone take you home after the procedure. What happens during the procedure?  You will be given a medicine that makes you relaxed and sleepy (sedative).  A medicine may be sprayed or gargled to numb the back of the throat.  Your health care provider can use various instruments to do an esophageal dilatation. During the procedure, the instrument used will be placed in your mouth and passed down into your esophagus. Options include: ? Simple dilators. This instrument is carefully placed in the  esophagus to stretch it. ? Guided wire bougies. In this method, a flexible tube (endoscope) is used to insert a wire into the esophagus. The dilator is passed over this wire to enlarge the esophagus. Then the wire is removed. ? Balloon dilators. An endoscope with a small balloon at the end is passed down into the esophagus. Inflating the balloon gently stretches the esophagus and opens it up. What happens after the procedure?  Your blood pressure, heart rate, breathing rate, and blood oxygen level will be monitored often until the medicines you were given have worn off.  Your throat may feel slightly sore and will probably still feel numb. This will improve slowly over time.  You will not be allowed to eat or drink until  the throat numbness has resolved.  If this is a same-day procedure, you may be allowed to go home once you have been able to drink, urinate, and sit on the edge of the bed without nausea or dizziness.  If this is a same-day procedure, you should have a friend or family member with you for the next 24 hours after the procedure. This information is not intended to replace advice given to you by your health care provider. Make sure you discuss any questions you have with your health care provider. Document Released: 10/08/2005 Document Revised: 01/23/2016 Document Reviewed: 12/27/2013 Elsevier Interactive Patient Education  2018 [employer] Anesthesia is a term that refers to techniques, procedures, and medicines that help a person stay safe and comfortable during a medical procedure. Monitored anesthesia care, or sedation, is one type of anesthesia. Your anesthesia specialist may recommend sedation if you will be having a procedure that does not require you to be unconscious, such as:  Cataract surgery.  A dental procedure.  A biopsy.  A colonoscopy.  During the procedure, you may receive a medicine to help you relax (sedative). There are  three levels of sedation:  Mild sedation. At this level, you may feel awake and relaxed. You will be able to follow directions.  Moderate sedation. At this level, you will be sleepy. You may not remember the procedure.  Deep sedation. At this level, you will be asleep. You will not remember the procedure.  The more medicine you are given, the deeper your level of sedation will be. Depending on how you respond to the procedure, the anesthesia specialist may change your level of sedation or the type of anesthesia to fit your needs. An anesthesia specialist will monitor you closely during the procedure. Let your health care provider know about:  Any allergies you have.  All medicines you are taking, including vitamins, herbs, eye drops, creams, and over-the-counter medicines.  Any use of steroids (by mouth or as a cream).  Any problems you or family members have had with sedatives and anesthetic medicines.  Any blood disorders you have.  Any surgeries you have had.  Any medical conditions you have, such as sleep apnea.  Whether you are pregnant or may be pregnant.  Any use of cigarettes, alcohol, or street drugs. What are the risks? Generally, this is a safe procedure. However, problems may occur, including:  Getting too much medicine (oversedation).  Nausea.  Allergic reaction to medicines.  Trouble breathing. If this happens, a breathing tube may be used to help with breathing. It will be removed when you are awake and breathing on your own.  Heart trouble.  Lung trouble.  Before the procedure Staying hydrated Follow instructions from your health care provider about hydration, which may include:  Up to 2 hours before the procedure - you may continue to drink clear liquids, such as water, clear fruit juice, black coffee, and plain tea.  Eating and drinking restrictions Follow instructions from your health care provider about eating and drinking, which may include:  8  hours before the procedure - stop eating heavy meals or foods such as meat, fried foods, or fatty foods.  6 hours before the procedure - stop eating light meals or foods, such as toast or cereal.  6 hours before the procedure - stop drinking milk or drinks that contain milk.  2 hours before the procedure - stop drinking clear liquids.  Medicines Ask your health care provider about:  Changing or stopping your regular medicines. This is especially important if you are taking diabetes medicines or blood thinners.  Taking medicines such as aspirin and ibuprofen. These medicines can thin your blood. Do not take these medicines before your procedure if your health care provider instructs you not to.  Tests and exams  You will have a physical exam.  You may have blood tests done to show: ? How well your kidneys and liver are working. ? How well your blood can clot.  General instructions  Plan to have someone take you home from the hospital or clinic.  If you will be going home right after the procedure, plan to have someone with you for 24 hours.  What happens during the procedure?  Your blood pressure, heart rate, breathing, level of pain and overall condition will be monitored.  An IV tube will be inserted into one of your veins.  Your anesthesia specialist will give you medicines as needed to keep you comfortable during the procedure. This may mean changing the level of sedation.  The procedure will be performed. After the procedure  Your blood pressure, heart rate, breathing rate, and blood oxygen level will be monitored until the medicines you were given have worn off.  Do not drive for 24 hours if you received a sedative.  You may: ? Feel sleepy, clumsy, or nauseous. ? Feel forgetful about what happened after the procedure. ? Have a sore throat if you had a breathing tube during the procedure. ? Vomit. This information is not intended to replace advice given to you by  your health care provider. Make sure you discuss any questions you have with your health care provider. Document Released: 05/13/2005 Document Revised: 01/24/2016 Document Reviewed: 12/08/2015 Elsevier Interactive Patient Education  2018 [place], Care After These instructions provide you with information about caring for yourself after your procedure. Your health care provider may also give you more specific instructions. Your treatment has been planned according to current medical practices, but problems sometimes occur. Call your health care provider if you have any problems or questions after your procedure. What can I expect after the procedure? After your procedure, it is common to:  Feel sleepy for several hours.  Feel clumsy and have poor balance for several hours.  Feel forgetful about what happened after the procedure.  Have poor judgment for several hours.  Feel nauseous or vomit.  Have a sore throat if you had a breathing tube during the procedure.  Follow these instructions at home: For at least 24 hours after the procedure:   Do not: ? Participate in activities in which you could fall or become injured. ? Drive. ? Use heavy machinery. ? Drink alcohol. ? Take sleeping pills or medicines that cause drowsiness. ? Make important decisions or sign legal documents. ? Take care of children on your own.  Rest. Eating and drinking  Follow the diet that is recommended by your health care provider.  If you vomit, drink water, juice, or soup when you can drink without vomiting.  Make sure you have little or no nausea before eating solid foods. General instructions  Have a responsible adult stay with you until you are awake and alert.  Take over-the-counter and prescription medicines only as told by your health care provider.  If you smoke, do not smoke without supervision.  Keep all follow-up visits as told by your health care  provider. This is important. Contact a health care provider if:  You keep feeling nauseous or you keep vomiting.  You feel light-headed.  You develop a rash.  You have a fever. Get help right away if:  You have trouble breathing. This information is not intended to replace advice given to you by your health care provider. Make sure you discuss any questions you have with your health care provider. Document Released: 12/08/2015 Document Revised: 04/08/2016 Document Reviewed: 12/08/2015 Elsevier Interactive Patient Education  [employer].

## 2018-04-19 ENCOUNTER — Encounter (HOSPITAL_COMMUNITY): Payer: Self-pay

## 2018-04-19 ENCOUNTER — Encounter: Payer: Self-pay | Admitting: Gastroenterology

## 2018-04-19 ENCOUNTER — Other Ambulatory Visit: Payer: Self-pay

## 2018-04-19 ENCOUNTER — Encounter (HOSPITAL_COMMUNITY)
Admission: RE | Admit: 2018-04-19 | Discharge: 2018-04-19 | Disposition: A | Discharge status: Home / Self Care | Payer: Medicare HMO | Source: Ambulatory Visit | Attending: Internal Medicine | Admitting: Internal Medicine

## 2018-04-19 DIAGNOSIS — Z01812 Encounter for preprocedural laboratory examination: Secondary | ICD-10-CM | POA: Insufficient documentation

## 2018-04-19 DIAGNOSIS — Z01810 Encounter for preprocedural cardiovascular examination: Secondary | ICD-10-CM | POA: Insufficient documentation

## 2018-04-19 LAB — CBC
HCT: 40.3 % (ref 39.0–52.0)
Hemoglobin: 13.3 g/dL (ref 13.0–17.0)
MCH: 32.7 pg (ref 26.0–34.0)
MCHC: 33 g/dL (ref 30.0–36.0)
MCV: 99 fL (ref 78.0–100.0)
Platelets: 193 10*3/uL (ref 150–400)
RBC: 4.07 MIL/uL — ABNORMAL LOW (ref 4.22–5.81)
RDW: 13 % (ref 11.5–15.5)
WBC: 11.6 10*3/uL — ABNORMAL HIGH (ref 4.0–10.5)

## 2018-04-19 LAB — BASIC METABOLIC PANEL
Anion gap: 9 (ref 5–15)
BUN: 21 mg/dL (ref 8–23)
CO2: 27 mmol/L (ref 22–32)
Calcium: 9.5 mg/dL (ref 8.9–10.3)
Chloride: 110 mmol/L (ref 98–111)
Creatinine, Ser: 1.36 mg/dL — ABNORMAL HIGH (ref 0.61–1.24)
GFR calc Af Amer: >60 mL/min (ref >60)
GFR calc non Af Amer: 54 mL/min — ABNORMAL LOW (ref >60)
Glucose, Bld: 113 mg/dL — ABNORMAL HIGH (ref 70–99)
Potassium: 5 mmol/L (ref 3.5–5.1)
Sodium: 146 mmol/L — ABNORMAL HIGH (ref 135–145)

## 2018-04-19 NOTE — Assessment & Plan Note (Signed)
62 year old male with recurrent solid food dysphagia, liquid dysphagia. On PPI BID, refractory GERD. Last EGD in 2015. History of dilation in past with improvement in symptoms. Will arranged EGD/dilation in near future.  Proceed with upper endoscopy/dilation in the near future with Dr. Gala Romney. The risks, benefits, and alternatives have been discussed in detail with patient. They have stated understanding and desire to proceed.  Propofol due to polypharmacy  Will provide Dexilant samples when available; otherwise continue Protonix BID Phenergan prn nausea  6 month return

## 2018-04-20 NOTE — Progress Notes (Signed)
cc'd to pcp 

## 2018-04-25 ENCOUNTER — Ambulatory Visit (HOSPITAL_COMMUNITY): Payer: Medicare HMO | Admitting: Anesthesiology

## 2018-04-25 ENCOUNTER — Telehealth: Payer: Self-pay | Admitting: Internal Medicine

## 2018-04-25 ENCOUNTER — Other Ambulatory Visit: Payer: Self-pay

## 2018-04-25 ENCOUNTER — Encounter (HOSPITAL_COMMUNITY): Payer: Self-pay | Admitting: Anesthesiology

## 2018-04-25 ENCOUNTER — Encounter (HOSPITAL_COMMUNITY)
Admission: RE | Disposition: A | Discharge status: Home / Self Care | Payer: Self-pay | Source: Ambulatory Visit | Attending: Internal Medicine

## 2018-04-25 ENCOUNTER — Ambulatory Visit (HOSPITAL_COMMUNITY)
Admission: RE | Admit: 2018-04-25 | Discharge: 2018-04-25 | Disposition: A | Discharge status: Home / Self Care | Payer: Medicare HMO | Source: Ambulatory Visit | Attending: Internal Medicine | Admitting: Internal Medicine

## 2018-04-25 DIAGNOSIS — K219 Gastro-esophageal reflux disease without esophagitis: Secondary | ICD-10-CM | POA: Diagnosis not present

## 2018-04-25 DIAGNOSIS — G4730 Sleep apnea, unspecified: Secondary | ICD-10-CM | POA: Insufficient documentation

## 2018-04-25 DIAGNOSIS — F17210 Nicotine dependence, cigarettes, uncomplicated: Secondary | ICD-10-CM | POA: Diagnosis not present

## 2018-04-25 DIAGNOSIS — G43909 Migraine, unspecified, not intractable, without status migrainosus: Secondary | ICD-10-CM | POA: Insufficient documentation

## 2018-04-25 DIAGNOSIS — Z79899 Other long term (current) drug therapy: Secondary | ICD-10-CM | POA: Insufficient documentation

## 2018-04-25 DIAGNOSIS — K449 Diaphragmatic hernia without obstruction or gangrene: Secondary | ICD-10-CM | POA: Insufficient documentation

## 2018-04-25 DIAGNOSIS — R1310 Dysphagia, unspecified: Secondary | ICD-10-CM

## 2018-04-25 DIAGNOSIS — Z881 Allergy status to other antibiotic agents status: Secondary | ICD-10-CM | POA: Insufficient documentation

## 2018-04-25 DIAGNOSIS — E669 Obesity, unspecified: Secondary | ICD-10-CM | POA: Insufficient documentation

## 2018-04-25 DIAGNOSIS — G40909 Epilepsy, unspecified, not intractable, without status epilepticus: Secondary | ICD-10-CM | POA: Diagnosis not present

## 2018-04-25 DIAGNOSIS — I10 Essential (primary) hypertension: Secondary | ICD-10-CM | POA: Diagnosis not present

## 2018-04-25 DIAGNOSIS — Z882 Allergy status to sulfonamides status: Secondary | ICD-10-CM | POA: Insufficient documentation

## 2018-04-25 DIAGNOSIS — K3184 Gastroparesis: Secondary | ICD-10-CM | POA: Insufficient documentation

## 2018-04-25 DIAGNOSIS — Z9049 Acquired absence of other specified parts of digestive tract: Secondary | ICD-10-CM | POA: Insufficient documentation

## 2018-04-25 DIAGNOSIS — F419 Anxiety disorder, unspecified: Secondary | ICD-10-CM | POA: Diagnosis not present

## 2018-04-25 DIAGNOSIS — F329 Major depressive disorder, single episode, unspecified: Secondary | ICD-10-CM | POA: Insufficient documentation

## 2018-04-25 DIAGNOSIS — K589 Irritable bowel syndrome without diarrhea: Secondary | ICD-10-CM | POA: Diagnosis not present

## 2018-04-25 DIAGNOSIS — E785 Hyperlipidemia, unspecified: Secondary | ICD-10-CM | POA: Insufficient documentation

## 2018-04-25 DIAGNOSIS — Z800 Family history of malignant neoplasm of digestive organs: Secondary | ICD-10-CM | POA: Insufficient documentation

## 2018-04-25 DIAGNOSIS — Z8619 Personal history of other infectious and parasitic diseases: Secondary | ICD-10-CM | POA: Insufficient documentation

## 2018-04-25 DIAGNOSIS — Z7982 Long term (current) use of aspirin: Secondary | ICD-10-CM | POA: Diagnosis not present

## 2018-04-25 DIAGNOSIS — Z7984 Long term (current) use of oral hypoglycemic drugs: Secondary | ICD-10-CM | POA: Insufficient documentation

## 2018-04-25 DIAGNOSIS — G8929 Other chronic pain: Secondary | ICD-10-CM | POA: Diagnosis not present

## 2018-04-25 DIAGNOSIS — E1143 Type 2 diabetes mellitus with diabetic autonomic (poly)neuropathy: Secondary | ICD-10-CM | POA: Diagnosis not present

## 2018-04-25 HISTORY — PX: ESOPHAGOGASTRODUODENOSCOPY (EGD) WITH PROPOFOL: SHX5813

## 2018-04-25 LAB — GLUCOSE, CAPILLARY
Glucose-Capillary: 95 mg/dL (ref 70–99)
Glucose-Capillary: 102 mg/dL — ABNORMAL HIGH (ref 70–99)

## 2018-04-25 SURGERY — ESOPHAGOGASTRODUODENOSCOPY (EGD) WITH PROPOFOL
Anesthesia: General

## 2018-04-25 MED ORDER — LACTATED RINGERS IV SOLN
INTRAVENOUS | Status: DC
  Administered 2018-04-25: 09:00:00 via INTRAVENOUS

## 2018-04-25 MED ORDER — CHLORHEXIDINE GLUCONATE CLOTH 2 % EX PADS: 6.0000 | MEDICATED_PAD | Freq: Once | CUTANEOUS | Status: DC

## 2018-04-25 MED ORDER — LIDOCAINE HCL (CARDIAC) PF 50 MG/5ML IV SOSY
PREFILLED_SYRINGE | INTRAVENOUS | Status: DC | PRN
  Administered 2018-04-25: 50 mg via INTRAVENOUS

## 2018-04-25 MED ORDER — GLYCOPYRROLATE 0.2 MG/ML IJ SOLN
INTRAMUSCULAR | Status: AC
  Filled 2018-04-25: qty 1

## 2018-04-25 MED ORDER — HYDROCODONE-ACETAMINOPHEN 7.5-325 MG PO TABS: 1.0000 | ORAL_TABLET | Freq: Once | ORAL | Status: DC | PRN

## 2018-04-25 MED ORDER — PROPOFOL 10 MG/ML IV BOLUS
INTRAVENOUS | Status: DC | PRN
  Administered 2018-04-25: 20 mg via INTRAVENOUS
  Administered 2018-04-25: 30 mg via INTRAVENOUS

## 2018-04-25 MED ORDER — PROPOFOL 10 MG/ML IV BOLUS
INTRAVENOUS | Status: AC
  Filled 2018-04-25: qty 60

## 2018-04-25 MED ORDER — GLYCOPYRROLATE 0.2 MG/ML IJ SOLN
INTRAMUSCULAR | Status: DC | PRN
  Administered 2018-04-25: 0.2 mg via INTRAVENOUS

## 2018-04-25 MED ORDER — FENTANYL CITRATE (PF) 100 MCG/2ML IJ SOLN: 25.0000 ug | INTRAMUSCULAR | Status: DC | PRN

## 2018-04-25 MED ORDER — LIDOCAINE HCL (PF) 1 % IJ SOLN
INTRAMUSCULAR | Status: AC
  Filled 2018-04-25: qty 15

## 2018-04-25 MED ORDER — PROPOFOL 500 MG/50ML IV EMUL
INTRAVENOUS | Status: DC | PRN
  Administered 2018-04-25: 150 ug/kg/min via INTRAVENOUS

## 2018-04-25 NOTE — Discharge Instructions (Signed)
EGD Discharge instructions Please read the instructions outlined below and refer to this sheet in the next few weeks. These discharge instructions provide you with general information on caring for yourself after you leave the hospital. Your doctor may also give you specific instructions. While your treatment has been planned according to the most current medical practices available, unavoidable complications occasionally occur. If you have any problems or questions after discharge, please call your doctor. ACTIVITY  You may resume your regular activity but move at a slower pace for the next 24 hours.   Take frequent rest periods for the next 24 hours.   Walking will help expel (get rid of) the air and reduce the bloated feeling in your abdomen.   No driving for 24 hours (because of the anesthesia (medicine) used during the test).   You may shower.   Do not sign any important legal documents or operate any machinery for 24 hours (because of the anesthesia used during the test).  NUTRITION  Drink plenty of fluids.   You may resume your normal diet.   Begin with a light meal and progress to your normal diet.   Avoid alcoholic beverages for 24 hours or as instructed by your caregiver.  MEDICATIONS  You may resume your normal medications unless your caregiver tells you otherwise.  WHAT YOU CAN EXPECT TODAY  You may experience abdominal discomfort such as a feeling of fullness or gas pains.  FOLLOW-UP  Your doctor will discuss the results of your test with you.  SEEK IMMEDIATE MEDICAL ATTENTION IF ANY OF THE FOLLOWING OCCUR:  Excessive nausea (feeling sick to your stomach) and/or vomiting.   Severe abdominal pain and distention (swelling).   Trouble swallowing.   Temperature over 101 F (37.8 C).   Rectal bleeding or vomiting of blood.     Stomach still had food within it which kept [place] from dilating your esophagus  My office will be in contact with  You to  reschedule.     Monitored Anesthesia Care, Care After These instructions provide you with information about caring for yourself after your procedure. Your health care provider may also give you more specific instructions. Your treatment has been planned according to current medical practices, but problems sometimes occur. Call your health care provider if you have any problems or questions after your procedure. What can I expect after the procedure? After your procedure, it is common to:  Feel sleepy for several hours.  Feel clumsy and have poor balance for several hours.  Feel forgetful about what happened after the procedure.  Have poor judgment for several hours.  Feel nauseous or vomit.  Have a sore throat if you had a breathing tube during the procedure.  Follow these instructions at home: For at least 24 hours after the procedure:   Do not: ? Participate in activities in which you could fall or become injured. ? Drive. ? Use heavy machinery. ? Drink alcohol. ? Take sleeping pills or medicines that cause drowsiness. ? Make important decisions or sign legal documents. ? Take care of children on your own.  Rest. Eating and drinking  Follow the diet that is recommended by your health care provider.  If you vomit, drink water, juice, or soup when you can drink without vomiting.  Make sure you have little or no nausea before eating solid foods. General instructions  Have a responsible adult stay with you until you are awake and alert.  Take over-the-counter and prescription medicines only as  told by your health care provider.  If you smoke, do not smoke without supervision.  Keep all follow-up visits as told by your health care provider. This is important. Contact a health care provider if:  You keep feeling nauseous or you keep vomiting.  You feel light-headed.  You develop a rash.  You have a fever. Get help right away if:  You have trouble  breathing. This information is not intended to replace advice given to you by your health care provider. Make sure you discuss any questions you have with your health care provider. Document Released: 12/08/2015 Document Revised: 04/08/2016 Document Reviewed: 12/08/2015 Elsevier Interactive Patient Education  [employer].

## 2018-04-25 NOTE — Transfer of Care (Signed)
Immediate Anesthesia Transfer of Care Note  Patient: [name]  Procedure(s) Performed: ESOPHAGOGASTRODUODENOSCOPY (EGD) WITH PROPOFOL (N/A )  Patient Location: PACU  Anesthesia Type:MAC  Level of Consciousness: awake, alert , oriented and patient cooperative  Airway & Oxygen Therapy: Patient Spontanous Breathing  Post-op Assessment: Report given to RN and Post -op Vital signs reviewed and stable  Post vital signs: Reviewed and stable  Last Vitals:  Vitals Value Taken Time  BP    Temp    Pulse 71 04/25/2018  9:39 AM  Resp    SpO2 77 % 04/25/2018  9:39 AM  Vitals shown include unvalidated device data.  Last Pain:  Vitals:   04/25/18 0925  TempSrc:   PainSc: 0-No pain         Complications: No apparent anesthesia complications

## 2018-04-25 NOTE — Telephone Encounter (Signed)
Endo scheduler advised pt would receive pre-op phone call 05/03/18. Called and informed pt. Instructions mailed.

## 2018-04-25 NOTE — Telephone Encounter (Signed)
Short Stay called to say that patient had a stomach full of food and patient will need to be rescheduled.

## 2018-04-25 NOTE — Interval H&P Note (Signed)
History and Physical Interval Note:  04/25/2018 9:17 AM  Brian [name]  has presented today for surgery, with the diagnosis of dysphagia  The various methods of treatment have been discussed with the patient and family. After consideration of risks, benefits and other options for treatment, the patient has consented to  Procedure(s) with comments: ESOPHAGOGASTRODUODENOSCOPY (EGD) WITH PROPOFOL (N/A) - 9:30am - LM for pt to arrive at 8:00 [name] (N/A) as a surgical intervention .  The patient's history has been reviewed, patient examined, no change in status, stable for surgery.  I have reviewed the patient's chart and labs.  Questions were answered to the patient's satisfaction.     Brian [name]  No change. EGD with ED per plan.  The risks, benefits, limitations, alternatives and imponderables have been reviewed with the patient. Potential for esophageal dilation, biopsy, etc. have also been reviewed.  Questions have been answered. All parties agreeable.

## 2018-04-25 NOTE — Anesthesia Postprocedure Evaluation (Signed)
Anesthesia Post Note  Patient: Brian [name]  Procedure(s) Performed: ESOPHAGOGASTRODUODENOSCOPY (EGD) WITH PROPOFOL (N/A )  Patient location during evaluation: PACU Anesthesia Type: General Level of consciousness: awake and alert and oriented Pain management: pain level controlled Vital Signs Assessment: post-procedure vital signs reviewed and stable Respiratory status: spontaneous breathing Cardiovascular status: stable Postop Assessment: no apparent nausea or vomiting Anesthetic complications: no     Last Vitals:  Vitals:   04/25/18 0756  Pulse: 61  Temp: 36.7 C  SpO2: 95%    Last Pain:  Vitals:   04/25/18 0925  TempSrc:   PainSc: 0-No pain                 ADAMS, AMY A

## 2018-04-25 NOTE — Anesthesia Preprocedure Evaluation (Signed)
Anesthesia Evaluation  Patient identified by MRN, date of birth, ID band Patient awake    Reviewed: Allergy & Precautions, NPO status , Patient's Chart, lab work & pertinent test results, reviewed documented beta blocker date and time   Airway Mallampati: II  TM Distance: >3 FB Neck ROM: Full    Dental no notable dental hx. (+) Teeth Intact   Pulmonary neg pulmonary ROS, shortness of breath and with exertion, sleep apnea , Current Smoker,    Pulmonary exam normal breath sounds clear to auscultation       Cardiovascular Exercise Tolerance: Good hypertension, Pt. on medications and Pt. on home beta blockers negative cardio ROS Normal cardiovascular examI Rhythm:Regular Rate:Normal     Neuro/Psych  Headaches, Seizures -, Well Controlled,  Anxiety Depression negative neurological ROS  negative psych ROS   GI/Hepatic negative GI ROS, Neg liver ROS, GERD  Medicated and Controlled,  Endo/Other  negative endocrine ROSdiabetes, Well Controlled, Type 2  Renal/GU negative Renal ROS  negative genitourinary   Musculoskeletal negative musculoskeletal ROS (+)   Abdominal   Peds negative pediatric ROS (+)  Hematology negative hematology ROS (+)   Anesthesia Other Findings   Reproductive/Obstetrics negative OB ROS                             Anesthesia Physical Anesthesia Plan  ASA: III  Anesthesia Plan: General   Post-op Pain Management:    Induction: Intravenous  PONV Risk Score and Plan:   Airway Management Planned: Simple Face Mask  Additional Equipment:   Intra-op Plan:   Post-operative Plan:   Informed Consent:   Plan Discussed with: Anesthesiologist  Anesthesia Plan Comments:         Anesthesia Quick Evaluation

## 2018-04-25 NOTE — Telephone Encounter (Signed)
Called patient and he reports he needs to look at his schedule and will call me back to r/s

## 2018-04-25 NOTE — Op Note (Signed)
[facility] Patient Name: Brian [name] Procedure Date: 04/25/2018 8:58 AM MRN: [id number] Date of Birth: [birth year] Attending MD: Norvel Richards , MD CSN: [id number] Age: 62 Admit Type: Outpatient Procedure:                Upper GI endoscopy Indications:              Dysphagia Providers:                Norvel Richards, MD, Jeanann Lewandowsky. Sharon Seller, RN,                            Nelma Rothman, Technician Referring MD:             Vidal Schwalbe, MD Medicines:                Propofol per Anesthesia Complications:            No immediate complications. Estimated Blood Loss:     Estimated blood loss: none. Procedure:                Pre-Anesthesia Assessment:                           - Prior to the procedure, a History and Physical                            was performed, and patient medications and                            allergies were reviewed. The patient's tolerance of                            previous anesthesia was also reviewed. The risks                            and benefits of the procedure and the sedation                            options and risks were discussed with the patient.                            All questions were answered, and informed consent                            was obtained. Prior Anticoagulants: The patient has                            taken no previous anticoagulant or antiplatelet                            agents. After reviewing the risks and benefits, the                            patient was deemed in satisfactory condition to  undergo the procedure.                           After obtaining informed consent, the endoscope was                            passed under direct vision. Throughout the                            procedure, the patient's blood pressure, pulse, and                            oxygen saturations were monitored continuously. The                            [id number]-[id number] ([id number]) scope  was introduced through the                            and advanced to the second part of duodenum. The                            upper GI endoscopy was accomplished without                            difficulty. The patient tolerated the procedure                            well. Scope In: 9:29:49 AM Scope Out: 9:31:55 AM Total Procedure Duration: 0 hours 2 minutes 6 seconds  Findings:      The examined esophagus was normal.      A small hiatal hernia was present. Much retained gastric contents       precluded complete exaination and esophageal dilation today.      The duodenal bulb and second portion of the duodenum were normal.       Estimated blood loss: none. Impression:               - Normal esophagus.                           - Small hiatal hernia. retained gastric contents                            precluded esophageal dilation today.                           - Normal duodenal bulb and second portion of the                            duodenum.                           - No specimens collected. Moderate Sedation:      Moderate (conscious) sedation was personally administered by an       anesthesia professional. The following parameters were monitored: oxygen       saturation, heart rate, blood pressure, respiratory  rate, EKG, adequacy       of pulmonary ventilation, and response to care. Total physician       intraservice time was 9 minutes. Recommendation:           - Patient has a contact number available for                            emergencies. The signs and symptoms of potential                            delayed complications were discussed with the                            patient. Return to normal activities tomorrow.                            Written discharge instructions were provided to the                            patient.                           - Resume previous diet.                           - Repeat upper endoscopy in 4 weeks because the                             preparation was poor.                           - Return to GI office (date not yet determined). Procedure Code(s):        --- Professional ---                           [phone], 51, Esophagogastroduodenoscopy, flexible,                            transoral; diagnostic, including collection of                            specimen(s) by brushing or washing, when performed                            (separate procedure) Diagnosis Code(s):        --- Professional ---                           K44.9, Diaphragmatic hernia without obstruction or                            gangrene                           R13.10, Dysphagia, unspecified CPT copyright 2017 [employer]. All rights reserved. The codes documented in this report are preliminary and upon coder review  may  be revised to meet current compliance requirements. Brian [name]. Rourk, MD Norvel Richards, MD 04/25/2018 9:47:44 AM This report has been signed electronically. Number of Addenda: 0

## 2018-04-25 NOTE — Telephone Encounter (Signed)
Pt called office, EGD/DIL w/Propofol w/RMR scheduled for 05/09/18 at 10:30am. Orders entered.

## 2018-04-28 ENCOUNTER — Encounter (HOSPITAL_COMMUNITY): Payer: Self-pay | Admitting: Internal Medicine

## 2018-04-28 NOTE — Patient Instructions (Signed)
Brian [name]  04/28/2018     @PREFPERIOPPHARMACY @   Your procedure is scheduled on  05/09/2018   Report to [facility] at  900   A.M.  Call this number if you have problems the morning of surgery:  [phone]   Remember:  Do not eat or drink after midnight.  You may drink clear liquids until  (follow the instructions given to you) .  Clear liquids allowed are:                    Water, Juice (non-citric and without pulp), Carbonated beverages, Clear Tea, Black Coffee only, Plain Jell-O only, Gatorade and Plain Popsicles only    Take these medicines the morning of surgery with A SIP OF WATER amitriptyline, amlodipine, buspirone, celexa, valium, cymbalta, gabapentin, hydromorphone, losartan, zofran or phenergan, protonix, maxalt, topamax, valsartan.    Do not wear jewelry, make-up or nail polish.  Do not wear lotions, powders, or perfumes, or deodorant.  Do not shave 48 hours prior to surgery.  Men may shave face and neck.  Do not bring valuables to the hospital.  [name] is not responsible for any belongings or valuables.  Contacts, dentures or bridgework may not be worn into surgery.  Leave your suitcase in the car.  After surgery it may be brought to your room.  For patients admitted to the hospital, discharge time will be determined by your treatment team.  Patients discharged the day of surgery will not be allowed to drive home.   Name and phone number of your driver:   family Special instructions:  Follow the diet instructions given to you by Dr Daiva Nakayama' s office.  Please read over the following fact sheets that you were given. Anesthesia Post-op Instructions and Care and Recovery After Surgery       Esophagogastroduodenoscopy Esophagogastroduodenoscopy (EGD) is a procedure to examine the lining of the esophagus, stomach, and first part of the small intestine (duodenum). This procedure is done to check for problems such as inflammation,  bleeding, ulcers, or growths. During this procedure, a [name], flexible, lighted tube with a camera attached (endoscope) is inserted down the throat. Tell a health care provider about:  Any allergies you have.  All medicines you are taking, including vitamins, herbs, eye drops, creams, and over-the-counter medicines.  Any problems you or family members have had with anesthetic medicines.  Any blood disorders you have.  Any surgeries you have had.  Any medical conditions you have.  Whether you are pregnant or may be pregnant. What are the risks? Generally, this is a safe procedure. However, problems may occur, including:  Infection.  Bleeding.  A tear (perforation) in the esophagus, stomach, or duodenum.  Trouble breathing.  Excessive sweating.  Spasms of the larynx.  A slowed heartbeat.  Low blood pressure.  What happens before the procedure?  Follow instructions from your health care provider about eating or drinking restrictions.  Ask your health care provider about: ? Changing or stopping your regular medicines. This is especially important if you are taking diabetes medicines or blood thinners. ? Taking medicines such as aspirin and ibuprofen. These medicines can thin your blood. Do not take these medicines before your procedure if your health care provider instructs you not to.  Plan to have someone take you home after the procedure.  If you wear dentures, be ready to remove them before the procedure. What happens  during the procedure?  To reduce your risk of infection, your health care team will wash or sanitize their hands.  An IV tube will be put in a vein in your hand or arm. You will get medicines and fluids through this tube.  You will be given one or more of the following: ? A medicine to help you relax (sedative). ? A medicine to numb the area (local anesthetic). This medicine may be sprayed into your throat. It will make you feel more comfortable and  keep you from gagging or coughing during the procedure. ? A medicine for pain.  A mouth guard may be placed in your mouth to protect your teeth and to keep you from biting on the endoscope.  You will be asked to lie on your left side.  The endoscope will be lowered down your throat into your esophagus, stomach, and duodenum.  Air will be put into the endoscope. This will help your health care provider see better.  The lining of your esophagus, stomach, and duodenum will be examined.  Your health care provider may: ? Take a tissue sample so it can be looked at in a lab (biopsy). ? Remove growths. ? Remove objects (foreign bodies) that are stuck. ? Treat any bleeding with medicines or other devices that stop tissue from bleeding. ? Widen (dilate) or stretch narrowed areas of your esophagus and stomach.  The endoscope will be taken out. The procedure may vary among health care providers and hospitals. What happens after the procedure?  Your blood pressure, heart rate, breathing rate, and blood oxygen level will be monitored often until the medicines you were given have worn off.  Do not eat or drink anything until the numbing medicine has worn off and your gag reflex has returned. This information is not intended to replace advice given to you by your health care provider. Make sure you discuss any questions you have with your health care provider. Document Released: 12/18/2004 Document Revised: 01/23/2016 Document Reviewed: 07/11/2015 Elsevier Interactive Patient Education  2018 [employer]. Esophagogastroduodenoscopy, Care After Refer to this sheet in the next few weeks. These instructions provide you with information about caring for yourself after your procedure. Your health care provider may also give you more specific instructions. Your treatment has been planned according to current medical practices, but problems sometimes occur. Call your health care provider if you have any  problems or questions after your procedure. What can I expect after the procedure? After the procedure, it is common to have:  A sore throat.  Nausea.  Bloating.  Dizziness.  Fatigue.  Follow these instructions at home:  Do not eat or drink anything until the numbing medicine (local anesthetic) has worn off and your gag reflex has returned. You will know that the local anesthetic has worn off when you can swallow comfortably.  Do not drive for 24 hours if you received a medicine to help you relax (sedative).  If your health care provider took a tissue sample for testing during the procedure, make sure to get your test results. This is your responsibility. Ask your health care provider or the department performing the test when your results will be ready.  Keep all follow-up visits as told by your health care provider. This is important. Contact a health care provider if:  You cannot stop coughing.  You are not urinating.  You are urinating less than usual. Get help right away if:  You have trouble swallowing.  You cannot eat  or drink.  You have throat or chest pain that gets worse.  You are dizzy or light-headed.  You faint.  You have nausea or vomiting.  You have chills.  You have a fever.  You have severe abdominal pain.  You have black, tarry, or bloody stools. This information is not intended to replace advice given to you by your health care provider. Make sure you discuss any questions you have with your health care provider. Document Released: 08/03/2012 Document Revised: 01/23/2016 Document Reviewed: 07/11/2015 Elsevier Interactive Patient Education  2018 [employer].  Esophageal Dilatation Esophageal dilatation is a procedure to open a blocked or narrowed part of the esophagus. The esophagus is the Mangino tube in your throat that carries food and liquid from your mouth to your stomach. The procedure is also called esophageal dilation. You may need  this procedure if you have a buildup of scar tissue in your esophagus that makes it difficult, painful, or even impossible to swallow. This can be caused by gastroesophageal reflux disease (GERD). In rare cases, people need this procedure because they have cancer of the esophagus or a problem with the way food moves through the esophagus. Sometimes you may need to have another dilatation to enlarge the opening of the esophagus gradually. Tell a health care provider about:  Any allergies you have.  All medicines you are taking, including vitamins, herbs, eye drops, creams, and over-the-counter medicines.  Any problems you or family members have had with anesthetic medicines.  Any blood disorders you have.  Any surgeries you have had.  Any medical conditions you have.  Any antibiotic medicines you are required to take before dental procedures. What are the risks? Generally, this is a safe procedure. However, problems can occur and include:  Bleeding from a tear in the lining of the esophagus.  A hole (perforation) in the esophagus.  What happens before the procedure?  Do not eat or drink anything after midnight on the night before the procedure or as directed by your health care provider.  Ask your health care provider about changing or stopping your regular medicines. This is especially important if you are taking diabetes medicines or blood thinners.  Plan to have someone take you home after the procedure. What happens during the procedure?  You will be given a medicine that makes you relaxed and sleepy (sedative).  A medicine may be sprayed or gargled to numb the back of the throat.  Your health care provider can use various instruments to do an esophageal dilatation. During the procedure, the instrument used will be placed in your mouth and passed down into your esophagus. Options include: ? Simple dilators. This instrument is carefully placed in the esophagus to stretch  it. ? Guided wire bougies. In this method, a flexible tube (endoscope) is used to insert a wire into the esophagus. The dilator is passed over this wire to enlarge the esophagus. Then the wire is removed. ? Balloon dilators. An endoscope with a small balloon at the end is passed down into the esophagus. Inflating the balloon gently stretches the esophagus and opens it up. What happens after the procedure?  Your blood pressure, heart rate, breathing rate, and blood oxygen level will be monitored often until the medicines you were given have worn off.  Your throat may feel slightly sore and will probably still feel numb. This will improve slowly over time.  You will not be allowed to eat or drink until the throat numbness has resolved.  If this is a same-day procedure, you may be allowed to go home once you have been able to drink, urinate, and sit on the edge of the bed without nausea or dizziness.  If this is a same-day procedure, you should have a friend or family member with you for the next 24 hours after the procedure. This information is not intended to replace advice given to you by your health care provider. Make sure you discuss any questions you have with your health care provider. Document Released: 10/08/2005 Document Revised: 01/23/2016 Document Reviewed: 12/27/2013 Elsevier Interactive Patient Education  2018 [place] Anesthesia is a term that refers to techniques, procedures, and medicines that help a person stay safe and comfortable during a medical procedure. Monitored anesthesia care, or sedation, is one type of anesthesia. Your anesthesia specialist may recommend sedation if you will be having a procedure that does not require you to be unconscious, such as:  Cataract surgery.  A dental procedure.  A biopsy.  A colonoscopy.  During the procedure, you may receive a medicine to help you relax (sedative). There are three levels of  sedation:  Mild sedation. At this level, you may feel awake and relaxed. You will be able to follow directions.  Moderate sedation. At this level, you will be sleepy. You may not remember the procedure.  Deep sedation. At this level, you will be asleep. You will not remember the procedure.  The more medicine you are given, the deeper your level of sedation will be. Depending on how you respond to the procedure, the anesthesia specialist may change your level of sedation or the type of anesthesia to fit your needs. An anesthesia specialist will monitor you closely during the procedure. Let your health care provider know about:  Any allergies you have.  All medicines you are taking, including vitamins, herbs, eye drops, creams, and over-the-counter medicines.  Any use of steroids (by mouth or as a cream).  Any problems you or family members have had with sedatives and anesthetic medicines.  Any blood disorders you have.  Any surgeries you have had.  Any medical conditions you have, such as sleep apnea.  Whether you are pregnant or may be pregnant.  Any use of cigarettes, alcohol, or street drugs. What are the risks? Generally, this is a safe procedure. However, problems may occur, including:  Getting too much medicine (oversedation).  Nausea.  Allergic reaction to medicines.  Trouble breathing. If this happens, a breathing tube may be used to help with breathing. It will be removed when you are awake and breathing on your own.  Heart trouble.  Lung trouble.  Before the procedure Staying hydrated Follow instructions from your health care provider about hydration, which may include:  Up to 2 hours before the procedure - you may continue to drink clear liquids, such as water, clear fruit juice, black coffee, and plain tea.  Eating and drinking restrictions Follow instructions from your health care provider about eating and drinking, which may include:  8 hours before  the procedure - stop eating heavy meals or foods such as meat, fried foods, or fatty foods.  6 hours before the procedure - stop eating light meals or foods, such as toast or cereal.  6 hours before the procedure - stop drinking milk or drinks that contain milk.  2 hours before the procedure - stop drinking clear liquids.  Medicines Ask your health care provider about:  Changing or stopping your regular medicines.  This is especially important if you are taking diabetes medicines or blood thinners.  Taking medicines such as aspirin and ibuprofen. These medicines can thin your blood. Do not take these medicines before your procedure if your health care provider instructs you not to.  Tests and exams  You will have a physical exam.  You may have blood tests done to show: ? How well your kidneys and liver are working. ? How well your blood can clot.  General instructions  Plan to have someone take you home from the hospital or clinic.  If you will be going home right after the procedure, plan to have someone with you for 24 hours.  What happens during the procedure?  Your blood pressure, heart rate, breathing, level of pain and overall condition will be monitored.  An IV tube will be inserted into one of your veins.  Your anesthesia specialist will give you medicines as needed to keep you comfortable during the procedure. This may mean changing the level of sedation.  The procedure will be performed. After the procedure  Your blood pressure, heart rate, breathing rate, and blood oxygen level will be monitored until the medicines you were given have worn off.  Do not drive for 24 hours if you received a sedative.  You may: ? Feel sleepy, clumsy, or nauseous. ? Feel forgetful about what happened after the procedure. ? Have a sore throat if you had a breathing tube during the procedure. ? Vomit. This information is not intended to replace advice given to you by your health  care provider. Make sure you discuss any questions you have with your health care provider. Document Released: 05/13/2005 Document Revised: 01/24/2016 Document Reviewed: 12/08/2015 Elsevier Interactive Patient Education  2018 [place], Care After These instructions provide you with information about caring for yourself after your procedure. Your health care provider may also give you more specific instructions. Your treatment has been planned according to current medical practices, but problems sometimes occur. Call your health care provider if you have any problems or questions after your procedure. What can I expect after the procedure? After your procedure, it is common to:  Feel sleepy for several hours.  Feel clumsy and have poor balance for several hours.  Feel forgetful about what happened after the procedure.  Have poor judgment for several hours.  Feel nauseous or vomit.  Have a sore throat if you had a breathing tube during the procedure.  Follow these instructions at home: For at least 24 hours after the procedure:   Do not: ? Participate in activities in which you could fall or become injured. ? Drive. ? Use heavy machinery. ? Drink alcohol. ? Take sleeping pills or medicines that cause drowsiness. ? Make important decisions or sign legal documents. ? Take care of children on your own.  Rest. Eating and drinking  Follow the diet that is recommended by your health care provider.  If you vomit, drink water, juice, or soup when you can drink without vomiting.  Make sure you have little or no nausea before eating solid foods. General instructions  Have a responsible adult stay with you until you are awake and alert.  Take over-the-counter and prescription medicines only as told by your health care provider.  If you smoke, do not smoke without supervision.  Keep all follow-up visits as told by your health care provider. This is  important. Contact a health care provider if:  You keep feeling nauseous or  you keep vomiting.  You feel light-headed.  You develop a rash.  You have a fever. Get help right away if:  You have trouble breathing. This information is not intended to replace advice given to you by your health care provider. Make sure you discuss any questions you have with your health care provider. Document Released: 12/08/2015 Document Revised: 04/08/2016 Document Reviewed: 12/08/2015 Elsevier Interactive Patient Education  [employer].

## 2018-05-03 ENCOUNTER — Encounter (HOSPITAL_COMMUNITY)
Admission: RE | Admit: 2018-05-03 | Discharge: 2018-05-03 | Disposition: A | Discharge status: Home / Self Care | Payer: Medicare HMO | Source: Ambulatory Visit | Attending: Internal Medicine | Admitting: Internal Medicine

## 2018-05-09 ENCOUNTER — Other Ambulatory Visit: Payer: Self-pay

## 2018-05-09 ENCOUNTER — Encounter (HOSPITAL_COMMUNITY)
Admission: RE | Disposition: A | Discharge status: Home / Self Care | Payer: Self-pay | Source: Ambulatory Visit | Attending: Internal Medicine

## 2018-05-09 ENCOUNTER — Encounter (HOSPITAL_COMMUNITY): Payer: Self-pay

## 2018-05-09 ENCOUNTER — Ambulatory Visit (HOSPITAL_COMMUNITY): Payer: Medicare HMO | Admitting: Anesthesiology

## 2018-05-09 ENCOUNTER — Ambulatory Visit (HOSPITAL_COMMUNITY)
Admission: RE | Admit: 2018-05-09 | Discharge: 2018-05-09 | Disposition: A | Discharge status: Home / Self Care | Payer: Medicare HMO | Source: Ambulatory Visit | Attending: Internal Medicine | Admitting: Internal Medicine

## 2018-05-09 DIAGNOSIS — E669 Obesity, unspecified: Secondary | ICD-10-CM | POA: Diagnosis not present

## 2018-05-09 DIAGNOSIS — K449 Diaphragmatic hernia without obstruction or gangrene: Secondary | ICD-10-CM | POA: Insufficient documentation

## 2018-05-09 DIAGNOSIS — K589 Irritable bowel syndrome without diarrhea: Secondary | ICD-10-CM | POA: Diagnosis not present

## 2018-05-09 DIAGNOSIS — G40909 Epilepsy, unspecified, not intractable, without status epilepticus: Secondary | ICD-10-CM | POA: Insufficient documentation

## 2018-05-09 DIAGNOSIS — Z6829 Body mass index (BMI) 29.0-29.9, adult: Secondary | ICD-10-CM | POA: Diagnosis not present

## 2018-05-09 DIAGNOSIS — F17210 Nicotine dependence, cigarettes, uncomplicated: Secondary | ICD-10-CM | POA: Insufficient documentation

## 2018-05-09 DIAGNOSIS — Z79899 Other long term (current) drug therapy: Secondary | ICD-10-CM | POA: Diagnosis not present

## 2018-05-09 DIAGNOSIS — Z7984 Long term (current) use of oral hypoglycemic drugs: Secondary | ICD-10-CM | POA: Insufficient documentation

## 2018-05-09 DIAGNOSIS — G43909 Migraine, unspecified, not intractable, without status migrainosus: Secondary | ICD-10-CM | POA: Insufficient documentation

## 2018-05-09 DIAGNOSIS — F419 Anxiety disorder, unspecified: Secondary | ICD-10-CM | POA: Diagnosis not present

## 2018-05-09 DIAGNOSIS — K219 Gastro-esophageal reflux disease without esophagitis: Secondary | ICD-10-CM | POA: Insufficient documentation

## 2018-05-09 DIAGNOSIS — E785 Hyperlipidemia, unspecified: Secondary | ICD-10-CM | POA: Insufficient documentation

## 2018-05-09 DIAGNOSIS — Z79891 Long term (current) use of opiate analgesic: Secondary | ICD-10-CM | POA: Diagnosis not present

## 2018-05-09 DIAGNOSIS — Z7982 Long term (current) use of aspirin: Secondary | ICD-10-CM | POA: Diagnosis not present

## 2018-05-09 DIAGNOSIS — G5692 Unspecified mononeuropathy of left upper limb: Secondary | ICD-10-CM | POA: Diagnosis not present

## 2018-05-09 DIAGNOSIS — F329 Major depressive disorder, single episode, unspecified: Secondary | ICD-10-CM | POA: Insufficient documentation

## 2018-05-09 DIAGNOSIS — R1310 Dysphagia, unspecified: Secondary | ICD-10-CM | POA: Insufficient documentation

## 2018-05-09 DIAGNOSIS — K3184 Gastroparesis: Secondary | ICD-10-CM | POA: Insufficient documentation

## 2018-05-09 DIAGNOSIS — I10 Essential (primary) hypertension: Secondary | ICD-10-CM | POA: Diagnosis not present

## 2018-05-09 DIAGNOSIS — E1143 Type 2 diabetes mellitus with diabetic autonomic (poly)neuropathy: Secondary | ICD-10-CM | POA: Insufficient documentation

## 2018-05-09 DIAGNOSIS — G8929 Other chronic pain: Secondary | ICD-10-CM | POA: Insufficient documentation

## 2018-05-09 DIAGNOSIS — R1032 Left lower quadrant pain: Secondary | ICD-10-CM | POA: Diagnosis not present

## 2018-05-09 DIAGNOSIS — G4730 Sleep apnea, unspecified: Secondary | ICD-10-CM | POA: Insufficient documentation

## 2018-05-09 HISTORY — PX: MALONEY DILATION: SHX5535

## 2018-05-09 HISTORY — PX: ESOPHAGOGASTRODUODENOSCOPY (EGD) WITH PROPOFOL: SHX5813

## 2018-05-09 LAB — GLUCOSE, CAPILLARY
Glucose-Capillary: 110 mg/dL — ABNORMAL HIGH (ref 70–99)
Glucose-Capillary: 93 mg/dL (ref 70–99)

## 2018-05-09 SURGERY — ESOPHAGOGASTRODUODENOSCOPY (EGD) WITH PROPOFOL
Anesthesia: General

## 2018-05-09 MED ORDER — PROPOFOL 10 MG/ML IV BOLUS
INTRAVENOUS | Status: DC | PRN
  Administered 2018-05-09: 40 mg via INTRAVENOUS

## 2018-05-09 MED ORDER — LACTATED RINGERS IV SOLN
INTRAVENOUS | Status: DC
  Administered 2018-05-09: 11:00:00 via INTRAVENOUS

## 2018-05-09 MED ORDER — KETAMINE HCL 10 MG/ML IJ SOLN
INTRAMUSCULAR | Status: DC | PRN
  Administered 2018-05-09: 10 mg via INTRAVENOUS

## 2018-05-09 MED ORDER — FENTANYL CITRATE (PF) 100 MCG/2ML IJ SOLN: 25.0000 ug | INTRAMUSCULAR | Status: DC | PRN

## 2018-05-09 MED ORDER — STERILE WATER FOR IRRIGATION IR SOLN
Status: DC | PRN
  Administered 2018-05-09: 1.5 mL

## 2018-05-09 MED ORDER — HYDROCODONE-ACETAMINOPHEN 7.5-325 MG PO TABS: 1.0000 | ORAL_TABLET | Freq: Once | ORAL | Status: DC | PRN

## 2018-05-09 MED ORDER — LIDOCAINE HCL (CARDIAC) PF 50 MG/5ML IV SOSY
PREFILLED_SYRINGE | INTRAVENOUS | Status: DC | PRN
  Administered 2018-05-09: 50 mg via INTRAVENOUS

## 2018-05-09 MED ORDER — PROPOFOL 500 MG/50ML IV EMUL
INTRAVENOUS | Status: DC | PRN
  Administered 2018-05-09: 150 ug/kg/min via INTRAVENOUS

## 2018-05-09 MED ORDER — GLYCOPYRROLATE 0.2 MG/ML IJ SOLN
INTRAMUSCULAR | Status: DC | PRN
  Administered 2018-05-09: 0.2 mg via INTRAVENOUS

## 2018-05-09 MED ORDER — CHLORHEXIDINE GLUCONATE CLOTH 2 % EX PADS: 6.0000 | MEDICATED_PAD | Freq: Once | CUTANEOUS | Status: DC

## 2018-05-09 MED ORDER — GLYCOPYRROLATE 0.2 MG/ML IJ SOLN
INTRAMUSCULAR | Status: AC
  Filled 2018-05-09: qty 1

## 2018-05-09 MED ORDER — KETAMINE HCL 10 MG/ML IJ SOLN
INTRAMUSCULAR | Status: AC
  Filled 2018-05-09: qty 1

## 2018-05-09 MED ORDER — LIDOCAINE HCL (PF) 1 % IJ SOLN
INTRAMUSCULAR | Status: AC
  Filled 2018-05-09: qty 5

## 2018-05-09 NOTE — Discharge Instructions (Signed)
EGD Discharge instructions Please read the instructions outlined below and refer to this sheet in the next few weeks. These discharge instructions provide you with general information on caring for yourself after you leave the hospital. Your doctor may also give you specific instructions. While your treatment has been planned according to the most current medical practices available, unavoidable complications occasionally occur. If you have any problems or questions after discharge, please call your doctor. ACTIVITY  You may resume your regular activity but move at a slower pace for the next 24 hours.   Take frequent rest periods for the next 24 hours.   Walking will help expel (get rid of) the air and reduce the bloated feeling in your abdomen.   No driving for 24 hours (because of the anesthesia (medicine) used during the test).   You may shower.   Do not sign any important legal documents or operate any machinery for 24 hours (because of the anesthesia used during the test).  NUTRITION  Drink plenty of fluids.   You may resume your normal diet.   Begin with a light meal and progress to your normal diet.   Avoid alcoholic beverages for 24 hours or as instructed by your caregiver.  MEDICATIONS  You may resume your normal medications unless your caregiver tells you otherwise.  WHAT YOU CAN EXPECT TODAY  You may experience abdominal discomfort such as a feeling of fullness or gas pains.  FOLLOW-UP  Your doctor will discuss the results of your test with you.  SEEK IMMEDIATE MEDICAL ATTENTION IF ANY OF THE FOLLOWING OCCUR:  Excessive nausea (feeling sick to your stomach) and/or vomiting.   Severe abdominal pain and distention (swelling).   Trouble swallowing.   Temperature over 101 F (37.8 C).   Rectal bleeding or vomiting of blood.    Continue Protonix 40 mg twice daily  Office follow-up as needed in the future

## 2018-05-09 NOTE — Op Note (Signed)
[facility] Patient Name: Brian [name] Procedure Date: 05/09/2018 10:24 AM MRN: [id number] Date of Birth: May 06, 1956 Attending MD: Norvel Richards , MD CSN: [id number] Age: 62 Admit Type: Outpatient Procedure:                Upper GI endoscopy Indications:              Dysphagia Providers:                Norvel Richards, MD, Charlsie Quest. Theda Sers RN, RN,                            Aram Candela Referring MD:              Medicines:                Propofol per Anesthesia Complications:            No immediate complications. Estimated Blood Loss:     Estimated blood loss was minimal. Procedure:                Pre-Anesthesia Assessment:                           - Prior to the procedure, a History and Physical                            was performed, and patient medications and                            allergies were reviewed. The patient's tolerance of                            previous anesthesia was also reviewed. The risks                            and benefits of the procedure and the sedation                            options and risks were discussed with the patient.                            All questions were answered, and informed consent                            was obtained. Prior Anticoagulants: The patient has                            taken no previous anticoagulant or antiplatelet                            agents. ASA Grade Assessment: II - A patient with                            mild systemic disease. After reviewing the risks  and benefits, the patient was deemed in                            satisfactory condition to undergo the procedure.                           After obtaining informed consent, the endoscope was                            passed under direct vision. Throughout the                            procedure, the patient's blood pressure, pulse, and                            oxygen saturations were  monitored continuously. The                            [id number]-H190 ([id number]) scope was introduced through the                            and advanced to the second part of duodenum. The                            upper GI endoscopy was accomplished without                            difficulty. The patient tolerated the procedure                            well. Scope In: 10:50:31 AM Scope Out: 10:55:03 AM Total Procedure Duration: 0 hours 4 minutes 32 seconds  Findings:      The examined esophagus was normal.      A small hiatal hernia was present. Slight amount of gastric residual       lining the gastric mucosa - was easily washed away. Stomach appeared       normal otherwise.      The duodenal bulb and second portion of the duodenum were normal. The       scope was withdrawn. Dilation was performed with a Maloney dilator with       mild resistance at 56 Fr. The dilation site was examined and showed no       change. Estimated blood loss: none. Impression:               - Normal esophagus. Dilated.                           - Small hiatal hernia.                           - Normal duodenal bulb and second portion of the                            duodenum.                           -  No specimens collected. Moderate Sedation:      Moderate (conscious) sedation was personally administered by an       anesthesia professional. The following parameters were monitored: oxygen       saturation, heart rate, blood pressure, respiratory rate, EKG, adequacy       of pulmonary ventilation, and response to care. Total physician       intraservice time was 8 minutes. Recommendation:           - Patient has a contact number available for                            emergencies. The signs and symptoms of potential                            delayed complications were discussed with the                            patient. Return to normal activities tomorrow.                            Written  discharge instructions were provided to the                            patient.                           - Resume previous diet.                           - Continue present medications.                           - No repeat upper endoscopy.                           - Return to GI office PRN. Procedure Code(s):        --- Professional ---                           [phone], Esophagogastroduodenoscopy, flexible,                            transoral; diagnostic, including collection of                            specimen(s) by brushing or washing, when performed                            (separate procedure)                           43450, Dilation of esophagus, by unguided sound or                            bougie, single or multiple passes Diagnosis Code(s):        --- Professional ---  K44.9, Diaphragmatic hernia without obstruction or                            gangrene                           R13.10, Dysphagia, unspecified CPT copyright 2017 [employer]. All rights reserved. The codes documented in this report are preliminary and upon coder review may  be revised to meet current compliance requirements. [name]. Rourk, MD Norvel Richards, MD 05/09/2018 11:04:08 AM This report has been signed electronically. Number of Addenda: 0

## 2018-05-09 NOTE — Addendum Note (Signed)
Addendum  created 05/09/18 1352 by Mickel Baas, CRNA   Intraprocedure Flowsheets edited, Sign clinical note, SmartForm saved

## 2018-05-09 NOTE — Transfer of Care (Addendum)
Immediate Anesthesia Transfer of Care Note  Patient: Brian [name]  Procedure(s) Performed: ESOPHAGOGASTRODUODENOSCOPY (EGD) WITH PROPOFOL (N/A ) MALONEY DILATION (N/A )  Patient Location: PACU  Anesthesia Type:General  Level of Consciousness: awake, alert , oriented and patient cooperative  Airway & Oxygen Therapy: Patient Spontanous Breathing  Post-op Assessment: Report given to RN and Post -op Vital signs reviewed and stable  Post vital signs: Reviewed and stable  Last Vitals:  Vitals Value Taken Time  BP 111/71 05/09/2018 11:04 AM  Temp    Pulse 75 05/09/2018 11:05 AM  Resp 16 05/09/2018 11:05 AM  SpO2 94 % 05/09/2018 11:05 AM  Vitals shown include unvalidated device data.  Last Pain:  Vitals:   05/09/18 1046  TempSrc:   PainSc: 7       Patients Stated Pain Goal: 8 (59/10/28 9022)  Complications: No apparent anesthesia complications

## 2018-05-09 NOTE — Interval H&P Note (Signed)
History and Physical Interval Note:  05/09/2018 10:42 AM  Brian [name]  has presented today for surgery, with the diagnosis of dysphagia  The various methods of treatment have been discussed with the patient and family. After consideration of risks, benefits and other options for treatment, the patient has consented to  Procedure(s) with comments: ESOPHAGOGASTRODUODENOSCOPY (EGD) WITH PROPOFOL (N/A) - 10:30am MALONEY DILATION (N/A) as a surgical intervention .  The patient's history has been reviewed, patient examined, no change in status, stable for surgery.  I have reviewed the patient's chart and labs.  Questions were answered to the patient's satisfaction.     Brian [name]  Last attempt at  [facility] failed because of r retained gastric contents. Will try again today per plan.  The risks, benefits, limitations, alternatives and imponderables have been reviewed with the patient. Potential for esophageal dilation, biopsy, etc. have also been reviewed.  Questions have been answered. All parties agreeable.

## 2018-05-09 NOTE — Anesthesia Procedure Notes (Signed)
Procedure Name: MAC Performed by: Adams, Amy A, CRNA Pre-anesthesia Checklist: Patient identified, Emergency Drugs available, Suction available, Patient being monitored and Timeout performed Oxygen Delivery Method: Simple face mask       

## 2018-05-09 NOTE — Anesthesia Postprocedure Evaluation (Addendum)
Anesthesia Post Note  Patient: Brian [name]  Procedure(s) Performed: ESOPHAGOGASTRODUODENOSCOPY (EGD) WITH PROPOFOL (N/A ) MALONEY DILATION (N/A )  Patient location during evaluation: PACU Anesthesia Type: General Level of consciousness: awake and alert and oriented Pain management: pain level controlled Vital Signs Assessment: post-procedure vital signs reviewed and stable Respiratory status: spontaneous breathing Cardiovascular status: stable Postop Assessment: no apparent nausea or vomiting Anesthetic complications: no     Last Vitals:  Vitals:   05/09/18 1013  BP: 127/78  Pulse: 78  Resp: 16  Temp: 36.9 C  SpO2: 94%    Last Pain:  Vitals:   05/09/18 1046  TempSrc:   PainSc: 7                  ADAMS, AMY A
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
## 2018-05-13 ENCOUNTER — Encounter (HOSPITAL_COMMUNITY): Payer: Self-pay | Admitting: Internal Medicine

## 2018-09-05 ENCOUNTER — Telehealth: Payer: Self-pay

## 2018-09-05 NOTE — Telephone Encounter (Signed)
Noted. Pt is aware and was advised to go to the ED.

## 2018-09-05 NOTE — Telephone Encounter (Signed)
Pt called with severe pain in his abdomen below his navel. He started having diarrhea around 6 pm after eating dinner around 5 pm. Pt is taking Phenergan which was given by another doctor due to getting nauseated more frequently. Pt reports no vomiting but says the pain is at a level 10. Pt was advised to go to the ED if his pain continues to be unbearable, if he gets severe vomiting or his symptoms worsen.

## 2018-09-05 NOTE — Telephone Encounter (Signed)
Agree. Unable to triage severe pain over the phone. Needs ED evaluation.

## 2018-10-12 ENCOUNTER — Encounter (HOSPITAL_COMMUNITY): Payer: Self-pay | Admitting: Emergency Medicine

## 2018-10-12 ENCOUNTER — Emergency Department (HOSPITAL_COMMUNITY)
Admission: EM | Admit: 2018-10-12 | Discharge: 2018-10-12 | Disposition: A | Discharge status: Home / Self Care | Payer: Medicare HMO | Attending: Emergency Medicine | Admitting: Emergency Medicine

## 2018-10-12 ENCOUNTER — Other Ambulatory Visit: Payer: Self-pay

## 2018-10-12 DIAGNOSIS — E119 Type 2 diabetes mellitus without complications: Secondary | ICD-10-CM | POA: Diagnosis not present

## 2018-10-12 DIAGNOSIS — R1032 Left lower quadrant pain: Secondary | ICD-10-CM | POA: Insufficient documentation

## 2018-10-12 DIAGNOSIS — F17210 Nicotine dependence, cigarettes, uncomplicated: Secondary | ICD-10-CM | POA: Insufficient documentation

## 2018-10-12 DIAGNOSIS — Z7982 Long term (current) use of aspirin: Secondary | ICD-10-CM | POA: Diagnosis not present

## 2018-10-12 DIAGNOSIS — I10 Essential (primary) hypertension: Secondary | ICD-10-CM | POA: Diagnosis not present

## 2018-10-12 DIAGNOSIS — Z7984 Long term (current) use of oral hypoglycemic drugs: Secondary | ICD-10-CM | POA: Diagnosis not present

## 2018-10-12 DIAGNOSIS — R109 Unspecified abdominal pain: Secondary | ICD-10-CM

## 2018-10-12 DIAGNOSIS — G8929 Other chronic pain: Secondary | ICD-10-CM

## 2018-10-12 DIAGNOSIS — Z79899 Other long term (current) drug therapy: Secondary | ICD-10-CM | POA: Diagnosis not present

## 2018-10-12 LAB — COMPREHENSIVE METABOLIC PANEL
ALT: 55 U/L — ABNORMAL HIGH (ref 0–44)
AST: 33 U/L (ref 15–41)
Albumin: 4.4 g/dL (ref 3.5–5.0)
Alkaline Phosphatase: 47 U/L (ref 38–126)
Anion gap: 10 (ref 5–15)
BUN: 22 mg/dL (ref 8–23)
CO2: 22 mmol/L (ref 22–32)
Calcium: 8.9 mg/dL (ref 8.9–10.3)
Chloride: 109 mmol/L (ref 98–111)
Creatinine, Ser: 1.41 mg/dL — ABNORMAL HIGH (ref 0.61–1.24)
GFR calc Af Amer: >60 mL/min (ref >60)
GFR calc non Af Amer: 53 mL/min — ABNORMAL LOW (ref >60)
Glucose, Bld: 233 mg/dL — ABNORMAL HIGH (ref 70–99)
Potassium: 4.2 mmol/L (ref 3.5–5.1)
Sodium: 141 mmol/L (ref 135–145)
Total Bilirubin: 0.3 mg/dL (ref 0.3–1.2)
Total Protein: 7.3 g/dL (ref 6.5–8.1)

## 2018-10-12 LAB — CBC WITH DIFFERENTIAL/PLATELET
Abs Immature Granulocytes: 0.03 10*3/uL (ref 0.00–0.07)
Basophils Absolute: 0.1 10*3/uL (ref 0.0–0.1)
Basophils Relative: 1 %
Eosinophils Absolute: 0.5 10*3/uL (ref 0.0–0.5)
Eosinophils Relative: 4 %
HCT: 48.2 % (ref 39.0–52.0)
Hemoglobin: 15.5 g/dL (ref 13.0–17.0)
Immature Granulocytes: 0 %
Lymphocytes Relative: 35 %
Lymphs Abs: 3.7 10*3/uL (ref 0.7–4.0)
MCH: 32.5 pg (ref 26.0–34.0)
MCHC: 32.2 g/dL (ref 30.0–36.0)
MCV: 101 fL — ABNORMAL HIGH (ref 80.0–100.0)
Monocytes Absolute: 1.1 10*3/uL — ABNORMAL HIGH (ref 0.1–1.0)
Monocytes Relative: 10 %
Neutro Abs: 5.1 10*3/uL (ref 1.7–7.7)
Neutrophils Relative %: 50 %
Platelets: 233 10*3/uL (ref 150–400)
RBC: 4.77 MIL/uL (ref 4.22–5.81)
RDW: 12.9 % (ref 11.5–15.5)
WBC: 10.5 10*3/uL (ref 4.0–10.5)
nRBC: 0 % (ref 0.0–0.2)

## 2018-10-12 LAB — LIPASE, BLOOD: Lipase: 19 U/L (ref 11–51)

## 2018-10-12 MED ORDER — CAPSAICIN 0.025 % EX CREA
1.0000 "application " | TOPICAL_CREAM | Freq: Once | CUTANEOUS | Status: AC
  Administered 2018-10-12: 1 via TOPICAL
  Filled 2018-10-12: qty 60

## 2018-10-12 MED ORDER — HYDROCODONE-ACETAMINOPHEN 5-325 MG PO TABS
2.0000 | ORAL_TABLET | Freq: Once | ORAL | Status: AC
  Administered 2018-10-12: 2 via ORAL
  Filled 2018-10-12: qty 2
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
## 2018-10-12 NOTE — ED Triage Notes (Signed)
PT c/o middle abdominal pain intermittently since 08/31/2018. PT denies any vomiting, diarrhea or urinary symptoms but states nausea at times.

## 2018-10-12 NOTE — ED Notes (Signed)
Lab tech was able to draw blood at this time and sent to lab. Pt informed of need for urine sample and cup provided to pt.

## 2018-10-12 NOTE — Discharge Instructions (Addendum)
Follow-up with your pain doctor for further management of your chronic pain.

## 2018-10-12 NOTE — ED Notes (Signed)
Pt is requesting pain medication as Hydrocodone for diverticulosis is no longer working.
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
## 2018-10-18 ENCOUNTER — Ambulatory Visit: Payer: Medicare HMO | Admitting: Gastroenterology

## 2019-01-04 ENCOUNTER — Other Ambulatory Visit: Payer: Self-pay

## 2019-01-04 ENCOUNTER — Encounter: Payer: Self-pay | Admitting: Gastroenterology

## 2019-01-04 ENCOUNTER — Ambulatory Visit (INDEPENDENT_AMBULATORY_CARE_PROVIDER_SITE_OTHER): Payer: Medicare HMO | Admitting: Gastroenterology

## 2019-01-04 ENCOUNTER — Encounter: Payer: Self-pay | Admitting: Internal Medicine

## 2019-01-04 DIAGNOSIS — K219 Gastro-esophageal reflux disease without esophagitis: Secondary | ICD-10-CM

## 2019-01-04 DIAGNOSIS — K3184 Gastroparesis: Secondary | ICD-10-CM | POA: Diagnosis not present

## 2019-01-04 MED ORDER — METOCLOPRAMIDE HCL 5 MG/5ML PO SOLN: 5.0000 mg | Freq: Three times a day (TID) | ORAL | 1 refills | Status: DC
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
## 2019-01-04 NOTE — Patient Instructions (Signed)
Continue Protonix twice a day, 30 minutes before breakfast and dinner.  I have sent in Reglan.  Let's start off taking this just twice a day, but you can increase if needed to three times a day before meals and at bedtime.  If no improvement, please call us. If you have any confusion, involuntary movements, call us right away.  Please call your primary care about the lower suprapubic discomfort. Please also let them know about the symptoms of feeling heavy in your chest at times.  We will see you in 2-3 months!  I enjoyed talking with you again today! As you know, I value our relationship and want to provide genuine, compassionate, and quality care. I welcome your feedback. If you receive a survey regarding your visit,  I greatly appreciate you taking time to fill this out. See you next time!  Annitta Needs, PhD, ANP-BC [facility]

## 2019-01-09 NOTE — Progress Notes (Signed)
CC'D TO PCP °

## 2019-02-07 ENCOUNTER — Telehealth: Payer: Self-pay | Admitting: Internal Medicine

## 2019-02-07 MED ORDER — ONDANSETRON HCL 8 MG PO TABS: 8.0000 mg | ORAL_TABLET | Freq: Three times a day (TID) | ORAL | 3 refills | Status: DC | PRN

## 2019-02-07 NOTE — Telephone Encounter (Signed)
Noted. lmom for pt.

## 2019-02-07 NOTE — Telephone Encounter (Signed)
Pt is needing a 90 day supply of his Zofran. He said that [facility] should be faxing [place] refill request and to make sure its for 90 days.

## 2019-02-07 NOTE — Telephone Encounter (Signed)
Spoke with pt, he would like a 90 day supply of Zofran sent to his pharmacy. When asked is he taking Reglan, pt states that every time he takes Reglan, he itches all over.

## 2019-02-07 NOTE — Telephone Encounter (Signed)
Please have him avoid Reglan going forward. Refills sent.

## 2019-02-07 NOTE — Addendum Note (Signed)
Addended by: Annitta Needs on: 02/07/2019 03:53 PM   Modules accepted: Orders

## 2019-02-09 ENCOUNTER — Telehealth: Payer: Self-pay | Admitting: Internal Medicine

## 2019-02-09 NOTE — Telephone Encounter (Signed)
Pt said that [facility] ended up with his RX of Zofran, but there's a confirmed receipt that [facility] received it on 02/07/2019. Please advise. [phone]

## 2019-02-09 NOTE — Telephone Encounter (Signed)
Spoke with pt. Medication was sent to [facility] mail order by [facility].

## 2019-02-13 ENCOUNTER — Other Ambulatory Visit: Payer: Self-pay

## 2019-02-13 ENCOUNTER — Telehealth: Payer: Self-pay

## 2019-02-13 NOTE — Telephone Encounter (Signed)
Received a call from [facility] mail order pharmacy. They wanted correction on Zofran medication. Pt is to take 1 8mg  tab po q 8 hrs for nausea and vomiting. [employer] is going to mail RX to pt.

## 2019-02-14 ENCOUNTER — Telehealth: Payer: Self-pay

## 2019-02-14 MED ORDER — ONDANSETRON HCL 8 MG PO TABS: 8.0000 mg | ORAL_TABLET | Freq: Three times a day (TID) | ORAL | 3 refills | Status: DC | PRN

## 2019-02-14 NOTE — Telephone Encounter (Signed)
Done

## 2019-02-14 NOTE — Telephone Encounter (Signed)
Pt called office, he wants 90 day supply of Zofran sent to [facility] instead of 30 day supply. States 90 day supply costs nothing and 30 days supply will cost him $8.  Routing to AB.

## 2019-03-13 ENCOUNTER — Telehealth: Payer: Self-pay

## 2019-03-13 ENCOUNTER — Emergency Department (HOSPITAL_COMMUNITY)
Admission: EM | Admit: 2019-03-13 | Discharge: 2019-03-14 | Disposition: A | Discharge status: Home / Self Care | Payer: Medicare HMO | Attending: Emergency Medicine | Admitting: Emergency Medicine

## 2019-03-13 ENCOUNTER — Encounter (HOSPITAL_COMMUNITY): Payer: Self-pay | Admitting: Emergency Medicine

## 2019-03-13 ENCOUNTER — Emergency Department (HOSPITAL_COMMUNITY): Payer: Medicare HMO

## 2019-03-13 ENCOUNTER — Other Ambulatory Visit: Payer: Self-pay

## 2019-03-13 DIAGNOSIS — K529 Noninfective gastroenteritis and colitis, unspecified: Secondary | ICD-10-CM

## 2019-03-13 DIAGNOSIS — I10 Essential (primary) hypertension: Secondary | ICD-10-CM | POA: Insufficient documentation

## 2019-03-13 DIAGNOSIS — Z79899 Other long term (current) drug therapy: Secondary | ICD-10-CM | POA: Insufficient documentation

## 2019-03-13 DIAGNOSIS — F17210 Nicotine dependence, cigarettes, uncomplicated: Secondary | ICD-10-CM | POA: Diagnosis not present

## 2019-03-13 DIAGNOSIS — E785 Hyperlipidemia, unspecified: Secondary | ICD-10-CM | POA: Insufficient documentation

## 2019-03-13 DIAGNOSIS — R1030 Lower abdominal pain, unspecified: Secondary | ICD-10-CM | POA: Diagnosis present

## 2019-03-13 DIAGNOSIS — E119 Type 2 diabetes mellitus without complications: Secondary | ICD-10-CM | POA: Insufficient documentation

## 2019-03-13 LAB — COMPREHENSIVE METABOLIC PANEL
ALT: 29 U/L (ref 0–44)
AST: 18 U/L (ref 15–41)
Albumin: 4.4 g/dL (ref 3.5–5.0)
Alkaline Phosphatase: 61 U/L (ref 38–126)
Anion gap: 7 (ref 5–15)
BUN: 24 mg/dL — ABNORMAL HIGH (ref 8–23)
CO2: 24 mmol/L (ref 22–32)
Calcium: 8.8 mg/dL — ABNORMAL LOW (ref 8.9–10.3)
Chloride: 109 mmol/L (ref 98–111)
Creatinine, Ser: 1.23 mg/dL (ref 0.61–1.24)
GFR calc Af Amer: >60 mL/min (ref >60)
GFR calc non Af Amer: >60 mL/min (ref >60)
Glucose, Bld: 104 mg/dL — ABNORMAL HIGH (ref 70–99)
Potassium: 4.2 mmol/L (ref 3.5–5.1)
Sodium: 140 mmol/L (ref 135–145)
Total Bilirubin: 0.2 mg/dL — ABNORMAL LOW (ref 0.3–1.2)
Total Protein: 7.3 g/dL (ref 6.5–8.1)

## 2019-03-13 LAB — CBC
HCT: 45 % (ref 39.0–52.0)
Hemoglobin: 14.7 g/dL (ref 13.0–17.0)
MCH: 32.1 pg (ref 26.0–34.0)
MCHC: 32.7 g/dL (ref 30.0–36.0)
MCV: 98.3 fL (ref 80.0–100.0)
Platelets: 234 10*3/uL (ref 150–400)
RBC: 4.58 MIL/uL (ref 4.22–5.81)
RDW: 12.8 % (ref 11.5–15.5)
WBC: 10.3 10*3/uL (ref 4.0–10.5)
nRBC: 0 % (ref 0.0–0.2)

## 2019-03-13 LAB — URINALYSIS, ROUTINE W REFLEX MICROSCOPIC
Bilirubin Urine: NEGATIVE
Glucose, UA: NEGATIVE mg/dL
Hgb urine dipstick: NEGATIVE
Ketones, ur: 5 mg/dL — AB
Leukocytes,Ua: NEGATIVE
Nitrite: NEGATIVE
Protein, ur: NEGATIVE mg/dL
Specific Gravity, Urine: 1.028 (ref 1.005–1.030)
pH: 5 (ref 5.0–8.0)

## 2019-03-13 LAB — LIPASE, BLOOD: Lipase: 33 U/L (ref 11–51)

## 2019-03-13 MED ORDER — HYDROMORPHONE HCL 1 MG/ML IJ SOLN
1.0000 mg | Freq: Once | INTRAMUSCULAR | Status: AC
  Administered 2019-03-13: 1 mg via INTRAVENOUS
  Filled 2019-03-13: qty 1

## 2019-03-13 MED ORDER — IOHEXOL 300 MG/ML  SOLN
100.0000 mL | Freq: Once | INTRAMUSCULAR | Status: AC | PRN
  Administered 2019-03-13: 100 mL via INTRAVENOUS

## 2019-03-13 MED ORDER — CIPROFLOXACIN HCL 250 MG PO TABS
500.0000 mg | ORAL_TABLET | Freq: Once | ORAL | Status: AC
  Administered 2019-03-14: 500 mg via ORAL
  Filled 2019-03-13: qty 2

## 2019-03-13 MED ORDER — CIPROFLOXACIN HCL 500 MG PO TABS: 500.0000 mg | ORAL_TABLET | Freq: Two times a day (BID) | ORAL | 0 refills | Status: DC

## 2019-03-13 MED ORDER — HYDROMORPHONE HCL 1 MG/ML IJ SOLN
1.0000 mg | Freq: Once | INTRAMUSCULAR | Status: DC
  Filled 2019-03-13: qty 1

## 2019-03-13 MED ORDER — METRONIDAZOLE 500 MG PO TABS: 500.0000 mg | ORAL_TABLET | Freq: Two times a day (BID) | ORAL | 0 refills | Status: DC

## 2019-03-13 MED ORDER — OXYCODONE-ACETAMINOPHEN 5-325 MG PO TABS: 1.0000 | ORAL_TABLET | ORAL | 0 refills | Status: DC | PRN

## 2019-03-13 MED ORDER — METRONIDAZOLE 500 MG PO TABS
500.0000 mg | ORAL_TABLET | Freq: Once | ORAL | Status: AC
  Administered 2019-03-14: 500 mg via ORAL
  Filled 2019-03-13: qty 1

## 2019-03-13 NOTE — Telephone Encounter (Signed)
Agree with ED evaluation. We are unable to control wait times, and he will be triaged as appropriate.

## 2019-03-13 NOTE — ED Notes (Signed)
Pt c/o intermittent abd pain x 2 weeks. "real bad" x 3 days. Diarrhea x 2 in last 24 hrs. Nausea but no vomiting. Pt states he has diverticulosis and thinks it is infected now. Grimacing and guarding noted.

## 2019-03-13 NOTE — ED Triage Notes (Signed)
Pt states that he has had abd pain for the past 3 days

## 2019-03-13 NOTE — Telephone Encounter (Signed)
Pt is having severe abdominal pain with nausea. Pt is taking Zofran, Reglan and hydrocodone with no help. Pt is eating to take medication. Pt states that his pain level is a level 10. Pt was advised to go to the ED after cancelling an appointment made for this Wednesday. Pt wants someone to call the ED so he doesn't have to wait as [place]. Pt is aware that this call may not be addressed until tomorrow and he should proceed to the ED since his pain level is a #10.

## 2019-03-13 NOTE — Discharge Instructions (Addendum)
Take the antibiotic as directed.  Call Dr. Roseanne Kaufman office to arrange a follow-up appt for later this week.  Return to ER for any worsening symptoms such as fever, increasing pain or bloody stools.

## 2019-03-14 MED ORDER — OXYCODONE-ACETAMINOPHEN 5-325 MG PO TABS: 2.0000 | ORAL_TABLET | ORAL | 0 refills | Status: DC | PRN

## 2019-03-14 NOTE — ED Notes (Signed)
signature pad does not work. Pt received d/c papers and prepack

## 2019-03-15 ENCOUNTER — Telehealth: Payer: Self-pay | Admitting: Internal Medicine

## 2019-03-15 ENCOUNTER — Ambulatory Visit: Payer: Medicare HMO | Admitting: Gastroenterology

## 2019-03-15 MED FILL — Oxycodone w/ Acetaminophen Tab 5-325 MG: ORAL | Qty: 6 | Status: AC

## 2019-03-15 NOTE — Telephone Encounter (Signed)
PATIENT CALLED TO LET us KNOW THAT HE WAS IN [place] RECENTLY AND HAD SOME TESTS DONE THAT OUR PHYSICIANS SHOULD REVIEW

## 2019-03-15 NOTE — Telephone Encounter (Signed)
Noted. Routing to AB per pt.
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
## 2019-03-16 ENCOUNTER — Telehealth: Payer: Self-pay | Admitting: Internal Medicine

## 2019-03-16 NOTE — Telephone Encounter (Signed)
Lmom, waiting on a return call.  

## 2019-03-16 NOTE — Telephone Encounter (Addendum)
Reviewed ED visit. Possible colitis of distal descending and proximal sigmoid but no significant surrounding inflammatory changes. ?mild colitis. Thick-walled urinary bladder.  See PCP as previously recommended due to suprapubic pain when last office visit and urinary hesitancy.   Please arrange routine office visit to see patient in person and discuss further evaluation. Last colonoscopy in Oct 2018. May need early interval.

## 2019-03-16 NOTE — Telephone Encounter (Signed)
Pt returned call. Pt is aware of AB's recommendations. Pt has a f/u 04/2019.

## 2019-03-16 NOTE — Telephone Encounter (Signed)
Pt said he was returning a call from AM. PLease call him at [phone]

## 2019-03-16 NOTE — Telephone Encounter (Signed)
Pt returned call. Lmom, will call pt back.
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
## 2019-04-19 ENCOUNTER — Ambulatory Visit: Payer: Medicare HMO | Admitting: Gastroenterology

## 2019-04-19 ENCOUNTER — Other Ambulatory Visit: Payer: Self-pay

## 2019-04-19 ENCOUNTER — Encounter: Payer: Self-pay | Admitting: Gastroenterology

## 2019-04-19 VITALS — BP 110/68 | HR 72 | Temp 97.0°F | Ht 72.0 in | Wt 197.2 lb

## 2019-04-19 DIAGNOSIS — G8929 Other chronic pain: Secondary | ICD-10-CM | POA: Diagnosis not present

## 2019-04-19 DIAGNOSIS — R109 Unspecified abdominal pain: Secondary | ICD-10-CM

## 2019-04-19 DIAGNOSIS — K529 Noninfective gastroenteritis and colitis, unspecified: Secondary | ICD-10-CM | POA: Diagnosis not present

## 2019-04-19 DIAGNOSIS — K3184 Gastroparesis: Secondary | ICD-10-CM

## 2019-04-19 DIAGNOSIS — K5732 Diverticulitis of large intestine without perforation or abscess without bleeding: Secondary | ICD-10-CM | POA: Insufficient documentation

## 2019-04-19 MED ORDER — HYOSCYAMINE SULFATE 0.125 MG SL SUBL: 0.1250 mg | SUBLINGUAL_TABLET | SUBLINGUAL | 3 refills | Status: DC | PRN

## 2019-04-19 MED ORDER — METOCLOPRAMIDE HCL 5 MG/5ML PO SOLN: 5.0000 mg | Freq: Two times a day (BID) | ORAL | 1 refills | Status: DC

## 2019-04-19 MED ORDER — PEG 3350-KCL-NA BICARB-NACL 420 G PO SOLR: 4000.0000 mL | ORAL | 0 refills | Status: DC

## 2019-04-19 NOTE — Patient Instructions (Signed)
We have scheduled you for a colonoscopy in the near future. Please don't take metformin the day of the procedure.  I have sent in Reglan to take before breakfast and at bedtime to hopefully help with nausea. Avoid late night snacks. Monitor for adverse side effects like involuntary movements, lip smacking, etc. If this happens, stop the medication and call us.  I will see you in 3-4 months!  I enjoyed seeing you again today! As you know, I value our relationship and want to provide genuine, compassionate, and quality care. I welcome your feedback. If you receive a survey regarding your visit,  I greatly appreciate you taking time to fill this out. See you next time!  Annitta Needs, PhD, ANP-BC [facility]

## 2019-04-24 ENCOUNTER — Telehealth: Payer: Self-pay | Admitting: Internal Medicine

## 2019-04-24 NOTE — Assessment & Plan Note (Signed)
Continue with Pain Management. I have also asked him to follow-up with Urology.

## 2019-04-24 NOTE — Telephone Encounter (Signed)
Called patient and made aware we currently do not have any openings for a Monday and he only does OR CASES MON/THURS. He voiced understanding.

## 2019-04-24 NOTE — Assessment & Plan Note (Signed)
Persistent nausea without vomiting. Continue PPI BID. Add low dose Reglan just BID for now. Monitor for adverse side effects and discussed with patient. No improvement with Zofran currently. Follow-up 3 months.

## 2019-04-24 NOTE — Telephone Encounter (Signed)
[place], HIS PROCEDURE IS ON A Thursday AND HE SAID HE CAN NOT DO BUT A Monday OR A Tuesday
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
## 2019-06-09 NOTE — Patient Instructions (Signed)
Brian [name]  06/09/2019     @PREFPERIOPPHARMACY @   Your procedure is scheduled on 06/15/2019  Report to [facility] at  40  A.M.  Call this number if you have problems the morning of surgery:  [phone]   Remember:  Follow the diet and prep instructions given to you by Dr Roseanne Kaufman office.                    Take these medicines the morning of surgery with A SIP OF WATER  Amlodipine, buspar, celexa, valium, pepcid, gabapentin, hydrocodone(if needed), reglan, zofran or phenergan(if needed), protonix.    Do not wear jewelry, make-up or nail polish.  Do not wear lotions, powders, or perfumes. Please wear deodorant and brush your teeth.  Do not shave 48 hours prior to surgery.  Men may shave face and neck.  Do not bring valuables to the hospital.  [facility] is not responsible for any belongings or valuables.  Contacts, dentures or bridgework may not be worn into surgery.  Leave your suitcase in the car.  After surgery it may be brought to your room.  For patients admitted to the hospital, discharge time will be determined by your treatment team.  Patients discharged the day of surgery will not be allowed to drive home.   Name and phone number of your driver:   family Special instructions:  None  Please read over the following fact sheets that you were given. Anesthesia Post-op Instructions and Care and Recovery After Surgery       Colonoscopy, Adult, Care After This sheet gives you information about how to care for yourself after your procedure. Your health care provider may also give you more specific instructions. If you have problems or questions, contact your health care provider. What can I expect after the procedure? After the procedure, it is common to have:  A small amount of blood in your stool for 24 hours after the procedure.  Some gas.  Mild abdominal cramping or bloating. Follow these instructions at home: General instructions  For the  first 24 hours after the procedure: ? Do not drive or use machinery. ? Do not sign important documents. ? Do not drink alcohol. ? Do your regular daily activities at a slower pace than normal. ? Eat soft, easy-to-digest foods.  Take over-the-counter or prescription medicines only as told by your health care provider. Relieving cramping and bloating   Try walking around when you have cramps or feel bloated.  Apply heat to your abdomen as told by your health care provider. Use a heat source that your health care provider recommends, such as a moist heat pack or a heating pad. ? Place a towel between your skin and the heat source. ? Leave the heat on for 20-30 minutes. ? Remove the heat if your skin turns bright red. This is especially important if you are unable to feel pain, heat, or cold. You may have a greater risk of getting burned. Eating and drinking   Drink enough fluid to keep your urine pale yellow.  Resume your normal diet as instructed by your health care provider. Avoid heavy or fried foods that are hard to digest.  Avoid drinking alcohol for as [place] as instructed by your health care provider. Contact a health care provider if:  You have blood in your stool 2-3 days after the procedure. Get help right away if:  You have more than a small spotting  of blood in your stool.  You pass large blood clots in your stool.  Your abdomen is swollen.  You have nausea or vomiting.  You have a fever.  You have increasing abdominal pain that is not relieved with medicine. Summary  After the procedure, it is common to have a small amount of blood in your stool. You may also have mild abdominal cramping and bloating.  For the first 24 hours after the procedure, do not drive or use machinery, sign important documents, or drink alcohol.  Contact your health care provider if you have a lot of blood in your stool, nausea or vomiting, a fever, or increased abdominal pain. This  information is not intended to replace advice given to you by your health care provider. Make sure you discuss any questions you have with your health care provider. Document Released: 03/31/2004 Document Revised: 06/09/2017 Document Reviewed: 10/29/2015 Elsevier Patient Education  2020 [place] After These instructions provide you with information about caring for yourself after your procedure. Your health care provider may also give you more specific instructions. Your treatment has been planned according to current medical practices, but problems sometimes occur. Call your health care provider if you have any problems or questions after your procedure. What can I expect after the procedure? After your procedure, you may:  Feel sleepy for several hours.  Feel clumsy and have poor balance for several hours.  Feel forgetful about what happened after the procedure.  Have poor judgment for several hours.  Feel nauseous or vomit.  Have a sore throat if you had a breathing tube during the procedure. Follow these instructions at home: For at least 24 hours after the procedure:      Have a responsible adult stay with you. It is important to have someone help care for you until you are awake and alert.  Rest as needed.  Do not: ? Participate in activities in which you could fall or become injured. ? Drive. ? Use heavy machinery. ? Drink alcohol. ? Take sleeping pills or medicines that cause drowsiness. ? Make important decisions or sign legal documents. ? Take care of children on your own. Eating and drinking  Follow the diet that is recommended by your health care provider.  If you vomit, drink water, juice, or soup when you can drink without vomiting.  Make sure you have little or no nausea before eating solid foods. General instructions  Take over-the-counter and prescription medicines only as told by your health care provider.  If you  have sleep apnea, surgery and certain medicines can increase your risk for breathing problems. Follow instructions from your health care provider about wearing your sleep device: ? Anytime you are sleeping, including during daytime naps. ? While taking prescription pain medicines, sleeping medicines, or medicines that make you drowsy.  If you smoke, do not smoke without supervision.  Keep all follow-up visits as told by your health care provider. This is important. Contact a health care provider if:  You keep feeling nauseous or you keep vomiting.  You feel light-headed.  You develop a rash.  You have a fever. Get help right away if:  You have trouble breathing. Summary  For several hours after your procedure, you may feel sleepy and have poor judgment.  Have a responsible adult stay with you for at least 24 hours or until you are awake and alert. This information is not intended to replace advice given to you by your  health care provider. Make sure you discuss any questions you have with your health care provider. Document Released: 12/08/2015 Document Revised: 11/15/2017 Document Reviewed: 12/08/2015 Elsevier Patient Education  2020 [employer].

## 2019-06-13 ENCOUNTER — Other Ambulatory Visit: Payer: Self-pay

## 2019-06-13 ENCOUNTER — Other Ambulatory Visit (HOSPITAL_COMMUNITY)
Admission: RE | Admit: 2019-06-13 | Discharge: 2019-06-13 | Disposition: A | Discharge status: Home / Self Care | Payer: Medicare HMO | Source: Ambulatory Visit | Attending: Internal Medicine | Admitting: Internal Medicine

## 2019-06-13 ENCOUNTER — Encounter (HOSPITAL_COMMUNITY)
Admission: RE | Admit: 2019-06-13 | Discharge: 2019-06-13 | Disposition: A | Discharge status: Home / Self Care | Payer: Medicare HMO | Source: Ambulatory Visit | Attending: Internal Medicine | Admitting: Internal Medicine

## 2019-06-13 ENCOUNTER — Encounter (HOSPITAL_COMMUNITY): Payer: Self-pay

## 2019-06-13 DIAGNOSIS — Z01812 Encounter for preprocedural laboratory examination: Secondary | ICD-10-CM | POA: Diagnosis present

## 2019-06-13 DIAGNOSIS — Z20828 Contact with and (suspected) exposure to other viral communicable diseases: Secondary | ICD-10-CM | POA: Insufficient documentation

## 2019-06-13 HISTORY — DX: Unspecified osteoarthritis, unspecified site: M19.90

## 2019-06-13 LAB — BASIC METABOLIC PANEL
Anion gap: 9 (ref 5–15)
BUN: 15 mg/dL (ref 8–23)
CO2: 21 mmol/L — ABNORMAL LOW (ref 22–32)
Calcium: 8.5 mg/dL — ABNORMAL LOW (ref 8.9–10.3)
Chloride: 111 mmol/L (ref 98–111)
Creatinine, Ser: 1.28 mg/dL — ABNORMAL HIGH (ref 0.61–1.24)
GFR calc Af Amer: >60 mL/min (ref >60)
GFR calc non Af Amer: 59 mL/min — ABNORMAL LOW (ref >60)
Glucose, Bld: 111 mg/dL — ABNORMAL HIGH (ref 70–99)
Potassium: 3.8 mmol/L (ref 3.5–5.1)
Sodium: 141 mmol/L (ref 135–145)

## 2019-06-13 LAB — SARS CORONAVIRUS 2 (TAT 6-24 HRS): SARS Coronavirus 2: NEGATIVE

## 2019-06-15 ENCOUNTER — Encounter (HOSPITAL_COMMUNITY)
Admission: RE | Disposition: A | Discharge status: Home / Self Care | Payer: Self-pay | Source: Home / Self Care | Attending: Internal Medicine

## 2019-06-15 ENCOUNTER — Ambulatory Visit (HOSPITAL_COMMUNITY)
Admission: RE | Admit: 2019-06-15 | Discharge: 2019-06-15 | Disposition: A | Discharge status: Home / Self Care | Payer: Medicare HMO | Attending: Internal Medicine | Admitting: Internal Medicine

## 2019-06-15 ENCOUNTER — Ambulatory Visit (HOSPITAL_COMMUNITY): Payer: Medicare HMO | Admitting: Anesthesiology

## 2019-06-15 DIAGNOSIS — R933 Abnormal findings on diagnostic imaging of other parts of digestive tract: Secondary | ICD-10-CM | POA: Diagnosis not present

## 2019-06-15 DIAGNOSIS — K5730 Diverticulosis of large intestine without perforation or abscess without bleeding: Secondary | ICD-10-CM | POA: Insufficient documentation

## 2019-06-15 DIAGNOSIS — K3184 Gastroparesis: Secondary | ICD-10-CM | POA: Insufficient documentation

## 2019-06-15 DIAGNOSIS — E785 Hyperlipidemia, unspecified: Secondary | ICD-10-CM | POA: Diagnosis not present

## 2019-06-15 DIAGNOSIS — R1032 Left lower quadrant pain: Secondary | ICD-10-CM | POA: Diagnosis not present

## 2019-06-15 DIAGNOSIS — F329 Major depressive disorder, single episode, unspecified: Secondary | ICD-10-CM | POA: Insufficient documentation

## 2019-06-15 DIAGNOSIS — G4730 Sleep apnea, unspecified: Secondary | ICD-10-CM | POA: Insufficient documentation

## 2019-06-15 DIAGNOSIS — Z7984 Long term (current) use of oral hypoglycemic drugs: Secondary | ICD-10-CM | POA: Diagnosis not present

## 2019-06-15 DIAGNOSIS — M1990 Unspecified osteoarthritis, unspecified site: Secondary | ICD-10-CM | POA: Insufficient documentation

## 2019-06-15 DIAGNOSIS — Z888 Allergy status to other drugs, medicaments and biological substances status: Secondary | ICD-10-CM | POA: Diagnosis not present

## 2019-06-15 DIAGNOSIS — K5909 Other constipation: Secondary | ICD-10-CM | POA: Diagnosis not present

## 2019-06-15 DIAGNOSIS — I10 Essential (primary) hypertension: Secondary | ICD-10-CM | POA: Insufficient documentation

## 2019-06-15 DIAGNOSIS — Z833 Family history of diabetes mellitus: Secondary | ICD-10-CM | POA: Insufficient documentation

## 2019-06-15 DIAGNOSIS — K219 Gastro-esophageal reflux disease without esophagitis: Secondary | ICD-10-CM | POA: Diagnosis not present

## 2019-06-15 DIAGNOSIS — E1143 Type 2 diabetes mellitus with diabetic autonomic (poly)neuropathy: Secondary | ICD-10-CM | POA: Diagnosis not present

## 2019-06-15 DIAGNOSIS — E669 Obesity, unspecified: Secondary | ICD-10-CM | POA: Diagnosis not present

## 2019-06-15 DIAGNOSIS — F17210 Nicotine dependence, cigarettes, uncomplicated: Secondary | ICD-10-CM | POA: Insufficient documentation

## 2019-06-15 DIAGNOSIS — Z881 Allergy status to other antibiotic agents status: Secondary | ICD-10-CM | POA: Insufficient documentation

## 2019-06-15 DIAGNOSIS — Z7982 Long term (current) use of aspirin: Secondary | ICD-10-CM | POA: Insufficient documentation

## 2019-06-15 DIAGNOSIS — K581 Irritable bowel syndrome with constipation: Secondary | ICD-10-CM | POA: Insufficient documentation

## 2019-06-15 DIAGNOSIS — K529 Noninfective gastroenteritis and colitis, unspecified: Secondary | ICD-10-CM

## 2019-06-15 DIAGNOSIS — G8929 Other chronic pain: Secondary | ICD-10-CM | POA: Diagnosis not present

## 2019-06-15 DIAGNOSIS — F419 Anxiety disorder, unspecified: Secondary | ICD-10-CM | POA: Insufficient documentation

## 2019-06-15 DIAGNOSIS — Z882 Allergy status to sulfonamides status: Secondary | ICD-10-CM | POA: Insufficient documentation

## 2019-06-15 HISTORY — PX: COLONOSCOPY WITH PROPOFOL: SHX5780

## 2019-06-15 LAB — GLUCOSE, CAPILLARY: Glucose-Capillary: 89 mg/dL (ref 70–99)

## 2019-06-15 SURGERY — COLONOSCOPY WITH PROPOFOL
Anesthesia: General

## 2019-06-15 MED ORDER — LACTATED RINGERS IV SOLN
INTRAVENOUS | Status: DC
  Administered 2019-06-15: 12:00:00 via INTRAVENOUS

## 2019-06-15 MED ORDER — MIDAZOLAM HCL 2 MG/2ML IJ SOLN
INTRAMUSCULAR | Status: AC
  Filled 2019-06-15: qty 2

## 2019-06-15 MED ORDER — PROPOFOL 10 MG/ML IV BOLUS
INTRAVENOUS | Status: AC
  Filled 2019-06-15: qty 20

## 2019-06-15 MED ORDER — PROMETHAZINE HCL 25 MG/ML IJ SOLN: 6.2500 mg | INTRAMUSCULAR | Status: DC | PRN

## 2019-06-15 MED ORDER — PROPOFOL 500 MG/50ML IV EMUL
INTRAVENOUS | Status: DC | PRN
  Administered 2019-06-15: 150 ug/kg/min via INTRAVENOUS

## 2019-06-15 MED ORDER — CHLORHEXIDINE GLUCONATE CLOTH 2 % EX PADS: 6.0000 | MEDICATED_PAD | Freq: Once | CUTANEOUS | Status: DC

## 2019-06-15 MED ORDER — HYDROCODONE-ACETAMINOPHEN 7.5-325 MG PO TABS
1.0000 | ORAL_TABLET | Freq: Once | ORAL | Status: AC | PRN
  Administered 2019-06-15: 1 via ORAL
  Filled 2019-06-15: qty 1

## 2019-06-15 MED ORDER — MIDAZOLAM HCL 2 MG/2ML IJ SOLN: 0.5000 mg | Freq: Once | INTRAMUSCULAR | Status: DC | PRN

## 2019-06-15 MED ORDER — HYDROMORPHONE HCL 1 MG/ML IJ SOLN: 0.2500 mg | INTRAMUSCULAR | Status: DC | PRN

## 2019-06-15 MED ORDER — MIDAZOLAM HCL 5 MG/5ML IJ SOLN
INTRAMUSCULAR | Status: DC | PRN
  Administered 2019-06-15: 2 mg via INTRAVENOUS

## 2019-06-15 NOTE — Discharge Instructions (Signed)
Colonoscopy Discharge Instructions  Read the instructions outlined below and refer to this sheet in the next few weeks. These discharge instructions provide you with general information on caring for yourself after you leave the hospital. Your doctor may also give you specific instructions. While your treatment has been planned according to the most current medical practices available, unavoidable complications occasionally occur. If you have any problems or questions after discharge, call Dr. Gala Romney at [phone]. ACTIVITY  You may resume your regular activity, but move at a slower pace for the next 24 hours.   Take frequent rest periods for the next 24 hours.   Walking will help get rid of the air and reduce the bloated feeling in your belly (abdomen).   No driving for 24 hours (because of the medicine (anesthesia) used during the test).    Do not sign any important legal documents or operate any machinery for 24 hours (because of the anesthesia used during the test).  NUTRITION  Drink plenty of fluids.   You may resume your normal diet as instructed by your doctor.   Begin with a light meal and progress to your normal diet. Heavy or fried foods are harder to digest and may make you feel sick to your stomach (nauseated).   Avoid alcoholic beverages for 24 hours or as instructed.  MEDICATIONS  You may resume your normal medications unless your doctor tells you otherwise.  WHAT YOU CAN EXPECT TODAY  Some feelings of bloating in the abdomen.   Passage of more gas than usual.   Spotting of blood in your stool or on the toilet paper.  IF YOU HAD POLYPS REMOVED DURING THE COLONOSCOPY:  No aspirin products for 7 days or as instructed.   No alcohol for 7 days or as instructed.   Eat a soft diet for the next 24 hours.  FINDING OUT THE RESULTS OF YOUR TEST Not all test results are available during your visit. If your test results are not back during the visit, make an appointment  with your caregiver to find out the results. Do not assume everything is normal if you have not heard from your caregiver or the medical facility. It is important for you to follow up on all of your test results.  SEEK IMMEDIATE MEDICAL ATTENTION IF:  You have more than a spotting of blood in your stool.   Your belly is swollen (abdominal distention).   You are nauseated or vomiting.   You have a temperature over 101.   You have abdominal pain or discomfort that is severe or gets worse throughout the day.        Constipation and diverticulosis information provided  Continue to manage constipation with dietary measures/fiber supplementation  You only have diverticulosis in your colon.  There is no colitis.  Continue follow-up with pain management.  No further GI testing recommended  Office visit with [place] in 2 months  [place], mother, at [phone].  Was unable to reach her      Monitored Anesthesia Care, Care After These instructions provide you with information about caring for yourself after your procedure. Your health care provider may also give you more specific instructions. Your treatment has been planned according to current medical practices, but problems sometimes occur. Call your health care provider if you have any problems or questions after your procedure. What can I expect after the procedure? After your procedure, you may:  Feel sleepy for several hours.  Feel clumsy and have  poor balance for several hours.  Feel forgetful about what happened after the procedure.  Have poor judgment for several hours.  Feel nauseous or vomit.  Have a sore throat if you had a breathing tube during the procedure. Follow these instructions at home: For at least 24 hours after the procedure:      Have a responsible adult stay with you. It is important to have someone help care for you until you are awake and alert.  Rest as needed.  Do  not: ? Participate in activities in which you could fall or become injured. ? Drive. ? Use heavy machinery. ? Drink alcohol. ? Take sleeping pills or medicines that cause drowsiness. ? Make important decisions or sign legal documents. ? Take care of children on your own. Eating and drinking  Follow the diet that is recommended by your health care provider.  If you vomit, drink water, juice, or soup when you can drink without vomiting.  Make sure you have little or no nausea before eating solid foods. General instructions  Take over-the-counter and prescription medicines only as told by your health care provider.  If you have sleep apnea, surgery and certain medicines can increase your risk for breathing problems. Follow instructions from your health care provider about wearing your sleep device: ? Anytime you are sleeping, including during daytime naps. ? While taking prescription pain medicines, sleeping medicines, or medicines that make you drowsy.  If you smoke, do not smoke without supervision.  Keep all follow-up visits as told by your health care provider. This is important. Contact a health care provider if:  You keep feeling nauseous or you keep vomiting.  You feel light-headed.  You develop a rash.  You have a fever. Get help right away if:  You have trouble breathing. Summary  For several hours after your procedure, you may feel sleepy and have poor judgment.  Have a responsible adult stay with you for at least 24 hours or until you are awake and alert. This information is not intended to replace advice given to you by your health care provider. Make sure you discuss any questions you have with your health care provider. Document Released: 12/08/2015 Document Revised: 11/15/2017 Document Reviewed: 12/08/2015 Elsevier Patient Education  2020 [employer].     Constipation, Adult Constipation is when a person has fewer bowel movements in a week than normal,  has difficulty having a bowel movement, or has stools that are dry, hard, or larger than normal. Constipation may be caused by an underlying condition. It may become worse with age if a person takes certain medicines and does not take in enough fluids. Follow these instructions at home: Eating and drinking   Eat foods that have a lot of fiber, such as fresh fruits and vegetables, whole grains, and beans.  Limit foods that are high in fat, low in fiber, or overly processed, such as french fries, hamburgers, cookies, candies, and soda.  Drink enough fluid to keep your urine clear or pale yellow. General instructions  Exercise regularly or as told by your health care provider.  Go to the restroom when you have the urge to go. Do not hold it in.  Take over-the-counter and prescription medicines only as told by your health care provider. These include any fiber supplements.  Practice pelvic floor retraining exercises, such as deep breathing while relaxing the lower abdomen and pelvic floor relaxation during bowel movements.  Watch your condition for any changes.  Keep all follow-up  visits as told by your health care provider. This is important. Contact a health care provider if:  You have pain that gets worse.  You have a fever.  You do not have a bowel movement after 4 days.  You vomit.  You are not hungry.  You lose weight.  You are bleeding from the anus.  You have thin, pencil-like stools. Get help right away if:  You have a fever and your symptoms suddenly get worse.  You leak stool or have blood in your stool.  Your abdomen is bloated.  You have severe pain in your abdomen.  You feel dizzy or you faint. This information is not intended to replace advice given to you by your health care provider. Make sure you discuss any questions you have with your health care provider. Document Released: 05/15/2004 Document Revised: 07/30/2017 Document Reviewed:  02/05/2016 Elsevier Patient Education  2020 [employer].     Diverticulosis  Diverticulosis is a condition that develops when small pouches (diverticula) form in the wall of the large intestine (colon). The colon is where water is absorbed and stool is formed. The pouches form when the inside layer of the colon pushes through weak spots in the outer layers of the colon. You may have a few pouches or many of them. What are the causes? The cause of this condition is not known. What increases the risk? The following factors may make you more likely to develop this condition:  Being older than age 49. Your risk for this condition increases with age. Diverticulosis is rare among people younger than age 78. By age 14, many people have it.  Eating a low-fiber diet.  Having frequent constipation.  Being overweight.  Not getting enough exercise.  Smoking.  Taking over-the-counter pain medicines, like aspirin and ibuprofen.  Having a family history of diverticulosis. What are the signs or symptoms? In most people, there are no symptoms of this condition. If you do have symptoms, they may include:  Bloating.  Cramps in the abdomen.  Constipation or diarrhea.  Pain in the lower left side of the abdomen. How is this diagnosed? This condition is most often diagnosed during an exam for other colon problems. Because diverticulosis usually has no symptoms, it often cannot be diagnosed independently. This condition may be diagnosed by:  Using a flexible scope to examine the colon (colonoscopy).  Taking an X-ray of the colon after dye has been put into the colon (barium enema).  Doing a CT scan. How is this treated? You may not need treatment for this condition if you have never developed an infection related to diverticulosis. If you have had an infection before, treatment may include:  Eating a high-fiber diet. This may include eating more fruits, vegetables, and grains.  Taking  a fiber supplement.  Taking a live bacteria supplement (probiotic).  Taking medicine to relax your colon.  Taking antibiotic medicines. Follow these instructions at home:  Drink 6-8 glasses of water or more each day to prevent constipation.  Try not to strain when you have a bowel movement.  If you have had an infection before: ? Eat more fiber as directed by your health care provider or your diet and nutrition specialist (dietitian). ? Take a fiber supplement or probiotic, if your health care provider approves.  Take over-the-counter and prescription medicines only as told by your health care provider.  If you were prescribed an antibiotic, take it as told by your health care provider. Do not stop  taking the antibiotic even if you start to feel better.  Keep all follow-up visits as told by your health care provider. This is important. Contact a health care provider if:  You have pain in your abdomen.  You have bloating.  You have cramps.  You have not had a bowel movement in 3 days. Get help right away if:  Your pain gets worse.  Your bloating becomes very bad.  You have a fever or chills, and your symptoms suddenly get worse.  You vomit.  You have bowel movements that are bloody or black.  You have bleeding from your rectum. Summary  Diverticulosis is a condition that develops when small pouches (diverticula) form in the wall of the large intestine (colon).  You may have a few pouches or many of them.  This condition is most often diagnosed during an exam for other colon problems.  If you have had an infection related to diverticulosis, treatment may include increasing the fiber in your diet, taking supplements, or taking medicines. This information is not intended to replace advice given to you by your health care provider. Make sure you discuss any questions you have with your health care provider. Document Released: 05/14/2004 Document Revised: 07/30/2017  Document Reviewed: 07/06/2016 Elsevier Patient Education  2020 [employer].
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
## 2019-06-15 NOTE — Transfer of Care (Signed)
Immediate Anesthesia Transfer of Care Note  Patient: Brian [name]  Procedure(s) Performed: COLONOSCOPY WITH PROPOFOL (N/A )  Patient Location: PACU  Anesthesia Type:MAC  Level of Consciousness: awake, alert , oriented and patient cooperative  Airway & Oxygen Therapy: Patient Spontanous Breathing  Post-op Assessment: Report given to RN and Post -op Vital signs reviewed and stable  Post vital signs: Reviewed and stable  Last Vitals:  Vitals Value Taken Time  BP    Temp    Pulse    Resp    SpO2      Last Pain:  Vitals:   06/15/19 1251  TempSrc:   PainSc: 8          Complications: No apparent anesthesia complications

## 2019-06-15 NOTE — Anesthesia Postprocedure Evaluation (Signed)
Anesthesia Post Note  Patient: Brian [name]  Procedure(s) Performed: COLONOSCOPY WITH PROPOFOL (N/A )  Patient location during evaluation: PACU Anesthesia Type: MAC Level of consciousness: awake and alert, patient cooperative and oriented Pain management: pain level controlled Vital Signs Assessment: post-procedure vital signs reviewed and stable Respiratory status: spontaneous breathing, respiratory function stable and nonlabored ventilation Cardiovascular status: stable Postop Assessment: no apparent nausea or vomiting Anesthetic complications: no     Last Vitals:  Vitals:   06/15/19 1204  BP: 122/71  Pulse: 65  Resp: 19  Temp: 37 C  SpO2: 92%    Last Pain:  Vitals:   06/15/19 1251  TempSrc:   PainSc: 8                  GREGORY,SUZANNE

## 2019-06-15 NOTE — Anesthesia Preprocedure Evaluation (Signed)
Anesthesia Evaluation  Patient identified by MRN, date of birth, ID band Patient awake    Reviewed: Allergy & Precautions, NPO status , Patient's Chart, lab work & pertinent test results  Airway Mallampati: II  TM Distance: >3 FB Neck ROM: Full    Dental no notable dental hx. (+) Teeth Intact   Pulmonary shortness of breath and with exertion, sleep apnea and Continuous Positive Airway Pressure Ventilation , Current Smoker,    Pulmonary exam normal breath sounds clear to auscultation       Cardiovascular Exercise Tolerance: Good hypertension, Pt. on medications negative cardio ROS Normal cardiovascular examI Rhythm:Regular Rate:Normal     Neuro/Psych  Headaches, Seizures -, Well Controlled,  Anxiety Depression One Sz 1983 Now no Sz meds negative psych ROS   GI/Hepatic Neg liver ROS, GERD  Medicated and Controlled,  Endo/Other  negative endocrine ROSdiabetes, Type 2, Oral Hypoglycemic Agents  Renal/GU negative Renal ROS  negative genitourinary   Musculoskeletal  (+) Arthritis , Osteoarthritis,    Abdominal   Peds negative pediatric ROS (+)  Hematology negative hematology ROS (+)   Anesthesia Other Findings   Reproductive/Obstetrics negative OB ROS                             Anesthesia Physical Anesthesia Plan  ASA: III  Anesthesia Plan: General   Post-op Pain Management:    Induction: Intravenous  PONV Risk Score and Plan: 1 and Treatment may vary due to age or medical condition, TIVA and Propofol infusion  Airway Management Planned: Simple Face Mask and Nasal Cannula  Additional Equipment:   Intra-op Plan:   Post-operative Plan:   Informed Consent: I have reviewed the patients History and Physical, chart, labs and discussed the procedure including the risks, benefits and alternatives for the proposed anesthesia with the patient or authorized representative who has indicated  his/her understanding and acceptance.     Dental advisory given  Plan Discussed with: CRNA  Anesthesia Plan Comments: (Plan Full PPE use  Plan GA with GETA as needed d/w pt -WTP with same after Q&A)        Anesthesia Quick Evaluation

## 2019-06-15 NOTE — Op Note (Signed)
[facility] Patient Name: Brian [name] Procedure Date: 06/15/2019 12:38 PM MRN: [id number] Date of Birth: 1955/09/24 Attending MD: Norvel Richards , MD CSN: [id number] Age: 63 Admit Type: Outpatient Procedure:                Colonoscopy Indications:              Abnormal CT of the GI tract Providers:                Norvel Richards, MD, Otis Peak B. Sharon Seller, RN,                            Nelma Rothman, Technician Referring MD:             Vidal Schwalbe Medicines:                Propofol per Anesthesia Complications:            No immediate complications. Estimated Blood Loss:     Estimated blood loss: none. Procedure:                Pre-Anesthesia Assessment:                           - Prior to the procedure, a History and Physical                            was performed, and patient medications and                            allergies were reviewed. The patient's tolerance of                            previous anesthesia was also reviewed. The risks                            and benefits of the procedure and the sedation                            options and risks were discussed with the patient.                            All questions were answered, and informed consent                            was obtained. Prior Anticoagulants: The patient has                            taken no previous anticoagulant or antiplatelet                            agents. ASA Grade Assessment: II - A patient with                            mild systemic disease. After reviewing the risks  and benefits, the patient was deemed in                            satisfactory condition to undergo the procedure.                           After obtaining informed consent, the colonoscope                            was passed under direct vision. Throughout the                            procedure, the patient's blood pressure, pulse, and   oxygen saturations were monitored continuously. The                            [id number] scope was introduced through                            the anus and advanced to the the cecum, identified                            by appendiceal orifice and ileocecal valve. The                            colonoscopy was performed without difficulty. The                            patient tolerated the procedure well. The quality                            of the bowel preparation was adequate. The                            ileocecal valve, appendiceal orifice, and rectum                            were photographed. Scope In: 12:54:59 PM Scope Out: 1:09:41 PM Scope Withdrawal Time: 0 hours 8 minutes 51 seconds  Total Procedure Duration: 0 hours 14 minutes 42 seconds  Findings:      The perianal and digital rectal examinations were normal.      Scattered small-mouthed diverticula were found in the sigmoid colon and       descending colon.      The exam was otherwise without abnormality on direct and retroflexion       views. Impression:               - Diverticulosis in the sigmoid colon and in the                            descending colon. No evidence of colitis or                            inflammatory bowel disease on today's examination.  CT findings likely either artifactual or                            indicatyive of a mild self-limiting infectious                            process although the latter is doubtful with no                            diarrhea or rectal bleeding.                           - The examination was otherwise normal on direct                            and retroflexion views.                           - No specimens collected. Moderate Sedation:      Moderate (conscious) sedation was administered by the endoscopy nurse       and supervised by the endoscopist. The following parameters were       monitored: oxygen  saturation, heart rate, blood pressure, respiratory       rate, EKG, adequacy of pulmonary ventilation, and response to care. Recommendation:           - Patient has a contact number available for                            emergencies. The signs and symptoms of potential                            delayed complications were discussed with the                            patient. Return to normal activities tomorrow.                            Written discharge instructions were provided to the                            patient.                           - Advance diet as tolerated. Continue to manage                            constipation with lifestyle maneuvers/fiber                            supplement as you are doing. No further GI testing                            warranted. Recommend focused follow-up with pain  management regarding continued chronic pain.                           - Continue present medications.                           - Repeat colonoscopy in 10 years for screening                            purposes.                           - Return to GI office in 2 months. Procedure Code(s):        --- Professional ---                           [phone], Colonoscopy, flexible; diagnostic, including                            collection of specimen(s) by brushing or washing,                            when performed (separate procedure) Diagnosis Code(s):        --- Professional ---                           K57.30, Diverticulosis of large intestine without                            perforation or abscess without bleeding                           R93.3, Abnormal findings on diagnostic imaging of                            other parts of digestive tract CPT copyright 2019 [employer]. All rights reserved. The codes documented in this report are preliminary and upon coder review may  be revised to meet current compliance  requirements. [name]. Rourk, MD Norvel Richards, MD 06/15/2019 1:22:56 PM This report has been signed electronically. Number of Addenda: 0

## 2019-06-22 ENCOUNTER — Encounter (HOSPITAL_COMMUNITY): Payer: Self-pay | Admitting: Internal Medicine

## 2019-07-05 ENCOUNTER — Encounter (HOSPITAL_COMMUNITY): Payer: Self-pay | Admitting: *Deleted

## 2019-07-05 ENCOUNTER — Emergency Department (HOSPITAL_COMMUNITY)
Admission: EM | Admit: 2019-07-05 | Discharge: 2019-07-05 | Disposition: A | Discharge status: Home / Self Care | Payer: Medicare HMO | Attending: Emergency Medicine | Admitting: Emergency Medicine

## 2019-07-05 ENCOUNTER — Other Ambulatory Visit: Payer: Self-pay

## 2019-07-05 DIAGNOSIS — Z5321 Procedure and treatment not carried out due to patient leaving prior to being seen by health care provider: Secondary | ICD-10-CM | POA: Diagnosis not present

## 2019-07-05 DIAGNOSIS — R1032 Left lower quadrant pain: Secondary | ICD-10-CM | POA: Diagnosis not present

## 2019-07-05 NOTE — ED Triage Notes (Signed)
Pt in c/o bil flank pain & worse L sided flank pain onset x 2 wks, denies dysuria, reports polyuria, pt denies fever & hematuria, seen at [facility] today with UA complete with normal results per pt, reports chronic diarrhea, A&O x4

## 2019-07-25 ENCOUNTER — Other Ambulatory Visit: Payer: Self-pay

## 2019-07-25 ENCOUNTER — Ambulatory Visit (INDEPENDENT_AMBULATORY_CARE_PROVIDER_SITE_OTHER): Payer: Medicare HMO | Admitting: Gastroenterology

## 2019-07-25 ENCOUNTER — Encounter: Payer: Self-pay | Admitting: Gastroenterology

## 2019-07-25 ENCOUNTER — Encounter: Payer: Self-pay | Admitting: Internal Medicine

## 2019-07-25 VITALS — BP 156/79 | HR 77 | Temp 97.9°F | Ht 72.0 in | Wt 200.0 lb

## 2019-07-25 DIAGNOSIS — R109 Unspecified abdominal pain: Secondary | ICD-10-CM | POA: Diagnosis not present

## 2019-07-25 DIAGNOSIS — R140 Abdominal distension (gaseous): Secondary | ICD-10-CM

## 2019-07-25 DIAGNOSIS — K3184 Gastroparesis: Secondary | ICD-10-CM | POA: Diagnosis not present

## 2019-07-25 MED ORDER — DICYCLOMINE HCL 10 MG PO CAPS: 10.0000 mg | ORAL_CAPSULE | ORAL | 3 refills | Status: DC | PRN

## 2019-07-25 MED ORDER — DICYCLOMINE HCL 10 MG PO CAPS: 10.0000 mg | ORAL_CAPSULE | Freq: Four times a day (QID) | ORAL | 3 refills | Status: DC | PRN

## 2019-07-25 NOTE — Patient Instructions (Addendum)
I have sent dicyclomine (Bentyl) to your pharmacy to take every 6 hours as needed for cramping. Watch for constipation, dry mouth, dizziness.   Please review the low FODMAP diet. Follow this for several weeks, then slowly reintroduce foods to see if there are any dietary triggers.  You can start a probiotic such as Restora, Align, Digestive Advantage, [employer]ips Colon Health, etc. We provided samples we had.  Gas-x or Beano are ok over the counter.  We will see you back in 3 months!  I enjoyed seeing you again today! As you know, I value our relationship and want to provide genuine, compassionate, and quality care. I welcome your feedback. If you receive a survey regarding your visit,  I greatly appreciate you taking time to fill this out. See you next time!  Annitta Needs, PhD, ANP-BC [facility]   Low-FODMAP Eating Plan  FODMAPs (fermentable oligosaccharides, disaccharides, monosaccharides, and polyols) are sugars that are hard for some people to digest. A low-FODMAP eating plan may help some people who have bowel (intestinal) diseases to manage their symptoms. This meal plan can be complicated to follow. Work with a diet and nutrition specialist (dietitian) to make a low-FODMAP eating plan that is right for you. A dietitian can make sure that you get enough nutrition from this diet. What are tips for following this plan? Reading food labels  Check labels for hidden FODMAPs such as: ? High-fructose syrup. ? Honey. ? Agave. ? Natural fruit flavors. ? Onion or garlic powder.  Choose low-FODMAP foods that contain 3-4 grams of fiber per serving.  Check food labels for serving sizes. Eat only one serving at a time to make sure FODMAP levels stay low. Meal planning  Follow a low-FODMAP eating plan for up to 6 weeks, or as told by your health care provider or dietitian.  To follow the eating plan: 1. Eliminate high-FODMAP foods from your diet completely. 2. Gradually  reintroduce high-FODMAP foods into your diet one at a time. Most people should wait a few days after introducing one high-FODMAP food before they introduce the next high-FODMAP food. Your dietitian can recommend how quickly you may reintroduce foods. 3. Keep a daily record of what you eat and drink, and make note of any symptoms that you have after eating. 4. Review your daily record with a dietitian regularly. Your dietitian can help you identify which foods you can eat and which foods you should avoid. General tips  Drink enough fluid each day to keep your urine pale yellow.  Avoid processed foods. These often have added sugar and may be high in FODMAPs.  Avoid most dairy products, whole grains, and sweeteners.  Work with a dietitian to make sure you get enough fiber in your diet. Recommended foods Grains  Gluten-free grains, such as rice, oats, buckwheat, quinoa, corn, polenta, and millet. Gluten-free pasta, bread, or cereal. Rice noodles. Corn tortillas. Vegetables  Eggplant, zucchini, cucumber, peppers, green beans, Brussels sprouts, bean sprouts, lettuce, arugula, kale, Swiss chard, spinach, collard greens, bok choy, summer squash, potato, and tomato. Limited amounts of corn, carrot, and sweet potato. Green parts of scallions. Fruits  Bananas, oranges, lemons, limes, blueberries, raspberries, strawberries, grapes, cantaloupe, honeydew melon, kiwi, papaya, passion fruit, and pineapple. Limited amounts of dried cranberries, banana chips, and shredded coconut. Dairy  Lactose-free milk, yogurt, and kefir. Lactose-free cottage cheese and ice cream. Non-dairy milks, such as almond, coconut, hemp, and rice milk. Yogurts made of non-dairy milks. Limited amounts of goat cheese, brie, mozzarella,  parmesan, swiss, and other hard cheeses. Meats and other protein foods  Unseasoned beef, pork, poultry, or fish. Eggs. Berniece Salines. Tofu (firm) and tempeh. Limited amounts of nuts and seeds, such as  almonds, walnuts, [place] nuts, pecans, peanuts, pumpkin seeds, chia seeds, and sunflower seeds. Fats and oils  Butter-free spreads. Vegetable oils, such as olive, canola, and sunflower oil. Seasoning and other foods  Artificial sweeteners with names that do not end in "ol" such as aspartame, saccharine, and stevia. Maple syrup, white table sugar, raw sugar, brown sugar, and molasses. Fresh basil, coriander, parsley, rosemary, and thyme. Beverages  Water and mineral water. Sugar-sweetened soft drinks. Small amounts of orange juice or cranberry juice. Black and green tea. Most dry wines. Coffee. This may not be a complete list of low-FODMAP foods. Talk with your dietitian for more information. Foods to avoid Grains  Wheat, including kamut, durum, and semolina. Barley and bulgur. Couscous. Wheat-based cereals. Wheat noodles, bread, crackers, and pastries. Vegetables  Chicory root, artichoke, asparagus, cabbage, snow peas, sugar snap peas, mushrooms, and cauliflower. Onions, garlic, leeks, and the white part of scallions. Fruits  Fresh, dried, and juiced forms of apple, pear, watermelon, peach, plum, cherries, apricots, blackberries, boysenberries, figs, nectarines, and mango. Avocado. Dairy  Milk, yogurt, ice cream, and soft cheese. Cream and sour cream. Milk-based sauces. Custard. Meats and other protein foods  Fried or fatty meat. Sausage. Cashews and pistachios. Soybeans, baked beans, black beans, chickpeas, kidney beans, fava beans, navy beans, lentils, and split peas. Seasoning and other foods  Any sugar-free gum or candy. Foods that contain artificial sweeteners such as sorbitol, mannitol, isomalt, or xylitol. Foods that contain honey, high-fructose corn syrup, or agave. Bouillon, vegetable stock, beef stock, and chicken stock. Garlic and onion powder. Condiments made with onion, such as hummus, chutney, pickles, relish, salad dressing, and salsa. Tomato  paste. Beverages  Chicory-based drinks. Coffee substitutes. Chamomile tea. Fennel tea. Sweet or fortified wines such as port or sherry. Diet soft drinks made with isomalt, mannitol, maltitol, sorbitol, or xylitol. Apple, pear, and mango juice. Juices with high-fructose corn syrup. This may not be a complete list of high-FODMAP foods. Talk with your dietitian to discuss what dietary choices are best for you.  Summary  A low-FODMAP eating plan is a short-term diet that eliminates FODMAPs from your diet to help ease symptoms of certain bowel diseases.  The eating plan usually lasts up to 6 weeks. After that, high-FODMAP foods are restarted gradually, one at a time, so you can find out which may be causing symptoms.  A low-FODMAP eating plan can be complicated. It is best to work with a dietitian who has experience with this type of plan. This information is not intended to replace advice given to you by your health care provider. Make sure you discuss any questions you have with your health care provider. Document Released: 04/13/2017 Document Revised: 07/30/2017 Document Reviewed: 04/13/2017 Elsevier Patient Education  2020 [employer].
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
## 2019-08-18 ENCOUNTER — Emergency Department (HOSPITAL_COMMUNITY): Payer: Medicare HMO

## 2019-08-18 ENCOUNTER — Encounter (HOSPITAL_COMMUNITY): Payer: Self-pay | Admitting: Emergency Medicine

## 2019-08-18 ENCOUNTER — Emergency Department (HOSPITAL_COMMUNITY)
Admission: EM | Admit: 2019-08-18 | Discharge: 2019-08-18 | Disposition: A | Discharge status: Home / Self Care | Payer: Medicare HMO | Attending: Emergency Medicine | Admitting: Emergency Medicine

## 2019-08-18 ENCOUNTER — Other Ambulatory Visit: Payer: Self-pay

## 2019-08-18 DIAGNOSIS — R197 Diarrhea, unspecified: Secondary | ICD-10-CM | POA: Diagnosis not present

## 2019-08-18 DIAGNOSIS — R05 Cough: Secondary | ICD-10-CM | POA: Diagnosis not present

## 2019-08-18 DIAGNOSIS — R519 Headache, unspecified: Secondary | ICD-10-CM | POA: Diagnosis not present

## 2019-08-18 DIAGNOSIS — F17210 Nicotine dependence, cigarettes, uncomplicated: Secondary | ICD-10-CM | POA: Diagnosis not present

## 2019-08-18 DIAGNOSIS — R079 Chest pain, unspecified: Secondary | ICD-10-CM | POA: Insufficient documentation

## 2019-08-18 DIAGNOSIS — E119 Type 2 diabetes mellitus without complications: Secondary | ICD-10-CM | POA: Diagnosis not present

## 2019-08-18 DIAGNOSIS — Z20828 Contact with and (suspected) exposure to other viral communicable diseases: Secondary | ICD-10-CM | POA: Diagnosis not present

## 2019-08-18 DIAGNOSIS — I10 Essential (primary) hypertension: Secondary | ICD-10-CM | POA: Diagnosis not present

## 2019-08-18 DIAGNOSIS — R059 Cough, unspecified: Secondary | ICD-10-CM

## 2019-08-18 LAB — CBC
HCT: 40.1 % (ref 39.0–52.0)
Hemoglobin: 13 g/dL (ref 13.0–17.0)
MCH: 32.7 pg (ref 26.0–34.0)
MCHC: 32.4 g/dL (ref 30.0–36.0)
MCV: 101 fL — ABNORMAL HIGH (ref 80.0–100.0)
Platelets: 256 10*3/uL (ref 150–400)
RBC: 3.97 MIL/uL — ABNORMAL LOW (ref 4.22–5.81)
RDW: 13.4 % (ref 11.5–15.5)
WBC: 10.1 10*3/uL (ref 4.0–10.5)
nRBC: 0 % (ref 0.0–0.2)

## 2019-08-18 LAB — BASIC METABOLIC PANEL
Anion gap: 8 (ref 5–15)
BUN: 23 mg/dL (ref 8–23)
CO2: 22 mmol/L (ref 22–32)
Calcium: 8.7 mg/dL — ABNORMAL LOW (ref 8.9–10.3)
Chloride: 112 mmol/L — ABNORMAL HIGH (ref 98–111)
Creatinine, Ser: 1.06 mg/dL (ref 0.61–1.24)
GFR calc Af Amer: >60 mL/min (ref >60)
GFR calc non Af Amer: >60 mL/min (ref >60)
Glucose, Bld: 101 mg/dL — ABNORMAL HIGH (ref 70–99)
Potassium: 4.1 mmol/L (ref 3.5–5.1)
Sodium: 142 mmol/L (ref 135–145)

## 2019-08-18 LAB — TROPONIN I (HIGH SENSITIVITY)
Troponin I (High Sensitivity): 3 ng/L (ref <18)
Troponin I (High Sensitivity): 4 ng/L (ref <18)

## 2019-08-18 MED ORDER — DIPHENHYDRAMINE HCL 50 MG/ML IJ SOLN
25.0000 mg | Freq: Once | INTRAMUSCULAR | Status: AC
  Administered 2019-08-18: 25 mg via INTRAVENOUS
  Filled 2019-08-18: qty 1

## 2019-08-18 MED ORDER — SODIUM CHLORIDE 0.9 % IV BOLUS
1000.0000 mL | Freq: Once | INTRAVENOUS | Status: AC
  Administered 2019-08-18: 1000 mL via INTRAVENOUS

## 2019-08-18 MED ORDER — PROCHLORPERAZINE EDISYLATE 10 MG/2ML IJ SOLN
10.0000 mg | Freq: Once | INTRAMUSCULAR | Status: AC
  Administered 2019-08-18: 10 mg via INTRAVENOUS
  Filled 2019-08-18: qty 2

## 2019-08-18 MED ORDER — ACETAMINOPHEN 500 MG PO TABS
1000.0000 mg | ORAL_TABLET | Freq: Once | ORAL | Status: AC
  Administered 2019-08-18: 1000 mg via ORAL
  Filled 2019-08-18: qty 2

## 2019-08-18 MED ORDER — SODIUM CHLORIDE 0.9% FLUSH
3.0000 mL | Freq: Once | INTRAVENOUS | Status: AC
  Administered 2019-08-18: 22:00:00 3 mL via INTRAVENOUS

## 2019-08-18 NOTE — Discharge Instructions (Addendum)
You were evaluated in the Emergency Department and after careful evaluation, we did not find any emergent condition requiring admission or further testing in the hospital.  Your exam/testing today was overall reassuring.  Your symptoms seem to be due to a viral illness, possibly the coronavirus.  We have tested you for the coronavirus here in the Emergency Department.  Please isolate or quarantine at home until you receive a negative test result.  If positive, we recommend continued home quarantine per [facility] recommendations.   Please return to the Emergency Department if you experience any worsening of your condition.  We encourage you to follow up with a primary care provider.  Thank you for allowing [place] to be a part of your care.

## 2019-08-18 NOTE — ED Triage Notes (Signed)
Patient's VS rechecked. NAD noted. Delay explained to patient.

## 2019-08-18 NOTE — ED Triage Notes (Signed)
Patient reports upper epigastric pain and centralized chest pain x 2.5 weeks. Patient also states he has had watery diarrhea, body aches, and chills x 3 weeks.
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
## 2019-08-19 LAB — SARS CORONAVIRUS 2 (TAT 6-24 HRS): SARS Coronavirus 2: NEGATIVE

## 2019-10-25 ENCOUNTER — Ambulatory Visit: Payer: Medicare HMO | Admitting: Gastroenterology
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
## 2019-11-07 ENCOUNTER — Ambulatory Visit: Payer: Medicare HMO | Admitting: Gastroenterology

## 2019-11-10 ENCOUNTER — Ambulatory Visit: Payer: Medicare HMO | Admitting: Gastroenterology

## 2019-11-10 ENCOUNTER — Encounter: Payer: Self-pay | Admitting: Gastroenterology

## 2019-11-10 ENCOUNTER — Other Ambulatory Visit: Payer: Self-pay

## 2019-11-10 VITALS — BP 145/77 | HR 79 | Temp 96.9°F | Ht 72.0 in | Wt 217.4 lb

## 2019-11-10 DIAGNOSIS — R198 Other specified symptoms and signs involving the digestive system and abdomen: Secondary | ICD-10-CM | POA: Diagnosis not present

## 2019-11-10 DIAGNOSIS — K219 Gastro-esophageal reflux disease without esophagitis: Secondary | ICD-10-CM | POA: Diagnosis not present

## 2019-11-10 MED ORDER — DICYCLOMINE HCL 10 MG PO CAPS: 10.0000 mg | ORAL_CAPSULE | Freq: Three times a day (TID) | ORAL | 3 refills | Status: DC
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
## 2019-11-10 NOTE — Patient Instructions (Signed)
We will avoid phenergan going forward. This is too sedating and not safe with many of the medications you are on. We will stick with Zofran for now as needed for nausea.   On the days you have looser stool, take Bentyl up to 4 times a day as needed but hold if constipation.  On days you feel constipation or have no BM, take 1 dose of Miralax that evening.  We will see you in 6 months  I enjoyed seeing you again today! As you know, I value our relationship and want to provide genuine, compassionate, and quality care. I welcome your feedback. If you receive a survey regarding your visit,  I greatly appreciate you taking time to fill this out. See you next time!  Annitta Needs, PhD, ANP-BC [facility]

## 2019-11-13 NOTE — Progress Notes (Signed)
Cc'ed to pcp °

## 2020-02-01 ENCOUNTER — Encounter: Payer: Self-pay | Admitting: Gastroenterology

## 2020-02-01 ENCOUNTER — Ambulatory Visit: Payer: Medicare HMO | Admitting: Gastroenterology

## 2020-02-01 ENCOUNTER — Other Ambulatory Visit: Payer: Self-pay

## 2020-02-01 ENCOUNTER — Encounter: Payer: Self-pay | Admitting: Internal Medicine

## 2020-02-01 VITALS — BP 114/71 | HR 70 | Temp 97.5°F | Ht 72.0 in | Wt 211.4 lb

## 2020-02-01 DIAGNOSIS — R109 Unspecified abdominal pain: Secondary | ICD-10-CM

## 2020-02-01 DIAGNOSIS — K219 Gastro-esophageal reflux disease without esophagitis: Secondary | ICD-10-CM | POA: Diagnosis not present

## 2020-02-01 DIAGNOSIS — K3184 Gastroparesis: Secondary | ICD-10-CM

## 2020-02-01 DIAGNOSIS — G8929 Other chronic pain: Secondary | ICD-10-CM

## 2020-02-01 DIAGNOSIS — R1310 Dysphagia, unspecified: Secondary | ICD-10-CM

## 2020-02-01 DIAGNOSIS — R198 Other specified symptoms and signs involving the digestive system and abdomen: Secondary | ICD-10-CM

## 2020-02-01 MED ORDER — HYOSCYAMINE SULFATE 0.125 MG SL SUBL
0.1250 mg | SUBLINGUAL_TABLET | SUBLINGUAL | 5 refills | Status: DC | PRN
Start: 2020-02-01 — End: 2020-02-13
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
## 2020-02-01 NOTE — Patient Instructions (Signed)
We are scheduling an upper endoscopy with dilation in the near future.  Continue Protonix twice a day, 30 minutes before breakfast and dinner.   Please resume Reglan 5 milliliters, 3 times a day before meals and once at bedtime. Monitor for involuntary movements, twitching, confusion, dry mouth, and stop if this occurs.  You can stop dicyclomine, as this is not helping. Let's try Levsin as needed for abdominal cramping and looser stool, every 4 hours. Monitor for constipation. Please call if worsening loose stool or greater than 3 loose stool a day for several days in a row.  We will see you back in 6 months!  I enjoyed seeing you again today! As you know, I value our relationship and want to provide genuine, compassionate, and quality care. I welcome your feedback. If you receive a survey regarding your visit,  I greatly appreciate you taking time to fill this out. See you next time!  Annitta Needs, PhD, ANP-BC [facility] Gastroenter[facility]

## 2020-02-06 ENCOUNTER — Telehealth: Payer: Self-pay | Admitting: Internal Medicine

## 2020-02-06 NOTE — Telephone Encounter (Signed)
Called and informed pt of pre-op/covid test appt 02/13/20 at 1:15pm. Letter mailed with procedure instructions.

## 2020-02-06 NOTE — Telephone Encounter (Signed)
Pt called to see if he had been scheduled his EGD yet. Please advise. [phone]

## 2020-02-06 NOTE — Telephone Encounter (Signed)
Pt called office, EGD/DIL w/Prop w/RMR scheduled for 02/15/20 at 9:30am. Procedure instructions given on phone. Orders entered.  PA for EGD/DIL submitted via [employer] website. Case approved. [employer]# [id number], valid 02/15/20-03/16/20.

## 2020-02-06 NOTE — Telephone Encounter (Signed)
Tried to call pt, no answer, LMOVM for return call.  

## 2020-02-12 ENCOUNTER — Encounter (HOSPITAL_COMMUNITY): Payer: Self-pay

## 2020-02-12 ENCOUNTER — Emergency Department (HOSPITAL_COMMUNITY): Payer: Medicare HMO

## 2020-02-12 ENCOUNTER — Observation Stay (HOSPITAL_COMMUNITY)
Admission: EM | Admit: 2020-02-12 | Discharge: 2020-02-13 | Disposition: A | Discharge status: Home / Self Care | Payer: Medicare HMO | Attending: Internal Medicine | Admitting: Internal Medicine

## 2020-02-12 ENCOUNTER — Other Ambulatory Visit: Payer: Self-pay

## 2020-02-12 DIAGNOSIS — T63004A Toxic effect of unspecified snake venom, undetermined, initial encounter: Secondary | ICD-10-CM

## 2020-02-12 DIAGNOSIS — R7989 Other specified abnormal findings of blood chemistry: Secondary | ICD-10-CM

## 2020-02-12 DIAGNOSIS — E1140 Type 2 diabetes mellitus with diabetic neuropathy, unspecified: Secondary | ICD-10-CM | POA: Diagnosis not present

## 2020-02-12 DIAGNOSIS — Z20822 Contact with and (suspected) exposure to covid-19: Secondary | ICD-10-CM | POA: Insufficient documentation

## 2020-02-12 DIAGNOSIS — Z23 Encounter for immunization: Secondary | ICD-10-CM | POA: Insufficient documentation

## 2020-02-12 DIAGNOSIS — K219 Gastro-esophageal reflux disease without esophagitis: Secondary | ICD-10-CM | POA: Diagnosis present

## 2020-02-12 DIAGNOSIS — I10 Essential (primary) hypertension: Secondary | ICD-10-CM | POA: Diagnosis not present

## 2020-02-12 DIAGNOSIS — T63061A Toxic effect of venom of other North and South American snake, accidental (unintentional), initial encounter: Principal | ICD-10-CM | POA: Insufficient documentation

## 2020-02-12 DIAGNOSIS — W5911XA Bitten by nonvenomous snake, initial encounter: Secondary | ICD-10-CM

## 2020-02-12 DIAGNOSIS — T63001A Toxic effect of unspecified snake venom, accidental (unintentional), initial encounter: Secondary | ICD-10-CM

## 2020-02-12 DIAGNOSIS — G40909 Epilepsy, unspecified, not intractable, without status epilepticus: Secondary | ICD-10-CM

## 2020-02-12 DIAGNOSIS — Z7982 Long term (current) use of aspirin: Secondary | ICD-10-CM | POA: Insufficient documentation

## 2020-02-12 DIAGNOSIS — R2232 Localized swelling, mass and lump, left upper limb: Secondary | ICD-10-CM | POA: Diagnosis present

## 2020-02-12 DIAGNOSIS — E119 Type 2 diabetes mellitus without complications: Secondary | ICD-10-CM

## 2020-02-12 DIAGNOSIS — R229 Localized swelling, mass and lump, unspecified: Secondary | ICD-10-CM | POA: Diagnosis not present

## 2020-02-12 DIAGNOSIS — G894 Chronic pain syndrome: Secondary | ICD-10-CM | POA: Diagnosis present

## 2020-02-12 DIAGNOSIS — K3184 Gastroparesis: Secondary | ICD-10-CM | POA: Diagnosis present

## 2020-02-12 DIAGNOSIS — R2230 Localized swelling, mass and lump, unspecified upper limb: Secondary | ICD-10-CM

## 2020-02-12 DIAGNOSIS — F17210 Nicotine dependence, cigarettes, uncomplicated: Secondary | ICD-10-CM | POA: Diagnosis not present

## 2020-02-12 DIAGNOSIS — M792 Neuralgia and neuritis, unspecified: Secondary | ICD-10-CM | POA: Diagnosis present

## 2020-02-12 DIAGNOSIS — Z720 Tobacco use: Secondary | ICD-10-CM | POA: Diagnosis present

## 2020-02-12 HISTORY — DX: Toxic effect of unspecified snake venom, undetermined, initial encounter: T63.004A

## 2020-02-12 HISTORY — DX: Other specified abnormal findings of blood chemistry: R79.89

## 2020-02-12 LAB — PROTIME-INR
INR: 1.1 (ref 0.8–1.2)
INR: 1.2 (ref 0.8–1.2)
Prothrombin Time: 13.8 seconds (ref 11.4–15.2)
Prothrombin Time: 14.5 seconds (ref 11.4–15.2)

## 2020-02-12 LAB — COMPREHENSIVE METABOLIC PANEL
ALT: 21 U/L (ref 0–44)
AST: 15 U/L (ref 15–41)
Albumin: 4.4 g/dL (ref 3.5–5.0)
Alkaline Phosphatase: 67 U/L (ref 38–126)
Anion gap: 9 (ref 5–15)
BUN: 22 mg/dL (ref 8–23)
CO2: 21 mmol/L — ABNORMAL LOW (ref 22–32)
Calcium: 9.1 mg/dL (ref 8.9–10.3)
Chloride: 111 mmol/L (ref 98–111)
Creatinine, Ser: 1.52 mg/dL — ABNORMAL HIGH (ref 0.61–1.24)
GFR calc Af Amer: 55 mL/min — ABNORMAL LOW (ref >60)
GFR calc non Af Amer: 48 mL/min — ABNORMAL LOW (ref >60)
Glucose, Bld: 108 mg/dL — ABNORMAL HIGH (ref 70–99)
Potassium: 3.8 mmol/L (ref 3.5–5.1)
Sodium: 141 mmol/L (ref 135–145)
Total Bilirubin: 0.6 mg/dL (ref 0.3–1.2)
Total Protein: 7.8 g/dL (ref 6.5–8.1)

## 2020-02-12 LAB — CBC WITH DIFFERENTIAL/PLATELET
Abs Immature Granulocytes: 0.02 10*3/uL (ref 0.00–0.07)
Basophils Absolute: 0.1 10*3/uL (ref 0.0–0.1)
Basophils Relative: 1 %
Eosinophils Absolute: 0.2 10*3/uL (ref 0.0–0.5)
Eosinophils Relative: 2 %
HCT: 40.9 % (ref 39.0–52.0)
Hemoglobin: 13.7 g/dL (ref 13.0–17.0)
Immature Granulocytes: 0 %
Lymphocytes Relative: 25 %
Lymphs Abs: 2.2 10*3/uL (ref 0.7–4.0)
MCH: 32.7 pg (ref 26.0–34.0)
MCHC: 33.5 g/dL (ref 30.0–36.0)
MCV: 97.6 fL (ref 80.0–100.0)
Monocytes Absolute: 0.8 10*3/uL (ref 0.1–1.0)
Monocytes Relative: 9 %
Neutro Abs: 5.5 10*3/uL (ref 1.7–7.7)
Neutrophils Relative %: 63 %
Platelets: 225 10*3/uL (ref 150–400)
RBC: 4.19 MIL/uL — ABNORMAL LOW (ref 4.22–5.81)
RDW: 13.2 % (ref 11.5–15.5)
WBC: 8.7 10*3/uL (ref 4.0–10.5)
nRBC: 0 % (ref 0.0–0.2)

## 2020-02-12 LAB — PLATELET COUNT: Platelets: 203 10*3/uL (ref 150–400)

## 2020-02-12 LAB — FIBRINOGEN
Fibrinogen: 372 mg/dL (ref 210–475)
Fibrinogen: 325 mg/dL (ref 210–475)

## 2020-02-12 LAB — SARS CORONAVIRUS 2 BY RT PCR (HOSPITAL ORDER, PERFORMED IN ~~LOC~~ HOSPITAL LAB): SARS Coronavirus 2: NEGATIVE

## 2020-02-12 MED ORDER — CITALOPRAM HYDROBROMIDE 20 MG PO TABS
20.0000 mg | ORAL_TABLET | Freq: Every day | ORAL | Status: DC
  Administered 2020-02-13: 20 mg via ORAL
  Filled 2020-02-12: qty 1

## 2020-02-12 MED ORDER — CALCIUM CARBONATE 1250 (500 CA) MG PO TABS
1.0000 | ORAL_TABLET | Freq: Every day | ORAL | Status: DC
  Administered 2020-02-13: 500 mg via ORAL
  Filled 2020-02-12: qty 1

## 2020-02-12 MED ORDER — HYDROMORPHONE HCL 1 MG/ML IJ SOLN
1.0000 mg | Freq: Once | INTRAMUSCULAR | Status: AC
  Administered 2020-02-12: 1 mg via INTRAVENOUS
  Filled 2020-02-12: qty 1

## 2020-02-12 MED ORDER — CROTALIDAE POLYVAL IMMUNE FAB IV SOLR
INTRAVENOUS | Status: AC
  Filled 2020-02-12: qty 36

## 2020-02-12 MED ORDER — SODIUM CHLORIDE 0.9 % IV SOLN
5.0000 | Freq: Once | INTRAVENOUS | Status: DC
  Filled 2020-02-12: qty 90

## 2020-02-12 MED ORDER — INSULIN ASPART 100 UNIT/ML ~~LOC~~ SOLN: 0.0000 [IU] | Freq: Three times a day (TID) | SUBCUTANEOUS | Status: DC

## 2020-02-12 MED ORDER — ONDANSETRON HCL 4 MG/2ML IJ SOLN: 4.0000 mg | Freq: Four times a day (QID) | INTRAMUSCULAR | Status: DC | PRN

## 2020-02-12 MED ORDER — AMLODIPINE BESYLATE 5 MG PO TABS
5.0000 mg | ORAL_TABLET | Freq: Every day | ORAL | Status: DC
  Administered 2020-02-13: 5 mg via ORAL
  Filled 2020-02-12: qty 1

## 2020-02-12 MED ORDER — BACLOFEN 10 MG PO TABS: 10.0000 mg | ORAL_TABLET | Freq: Three times a day (TID) | ORAL | Status: DC | PRN

## 2020-02-12 MED ORDER — SUCRALFATE 1 G PO TABS
1.0000 g | ORAL_TABLET | Freq: Three times a day (TID) | ORAL | Status: DC
  Administered 2020-02-13 (×2): 1 g via ORAL
  Filled 2020-02-12 (×2): qty 1

## 2020-02-12 MED ORDER — AMITRIPTYLINE HCL 25 MG PO TABS
100.0000 mg | ORAL_TABLET | Freq: Every day | ORAL | Status: DC
  Administered 2020-02-13: 100 mg via ORAL
  Filled 2020-02-12: qty 4

## 2020-02-12 MED ORDER — HYDROCODONE-ACETAMINOPHEN 5-325 MG PO TABS
1.0000 | ORAL_TABLET | Freq: Once | ORAL | Status: AC
  Administered 2020-02-12: 1 via ORAL
  Filled 2020-02-12: qty 1

## 2020-02-12 MED ORDER — SODIUM CHLORIDE 0.9 % IV BOLUS
1000.0000 mL | Freq: Once | INTRAVENOUS | Status: AC
  Administered 2020-02-12: 1000 mL via INTRAVENOUS

## 2020-02-12 MED ORDER — HYDROMORPHONE HCL 1 MG/ML IJ SOLN
1.0000 mg | INTRAMUSCULAR | Status: DC | PRN
  Administered 2020-02-13 (×2): 1 mg via INTRAVENOUS
  Filled 2020-02-12 (×3): qty 1

## 2020-02-12 MED ORDER — DIPHENHYDRAMINE HCL 25 MG PO CAPS
25.0000 mg | ORAL_CAPSULE | Freq: Every day | ORAL | Status: DC
  Administered 2020-02-13: 25 mg via ORAL
  Filled 2020-02-12: qty 1

## 2020-02-12 MED ORDER — TETANUS-DIPHTH-ACELL PERTUSSIS 5-2.5-18.5 LF-MCG/0.5 IM SUSP
0.5000 mL | Freq: Once | INTRAMUSCULAR | Status: AC
  Administered 2020-02-12: 0.5 mL via INTRAMUSCULAR
  Filled 2020-02-12: qty 0.5

## 2020-02-12 MED ORDER — ONDANSETRON HCL 4 MG PO TABS: 4.0000 mg | ORAL_TABLET | Freq: Four times a day (QID) | ORAL | Status: DC | PRN

## 2020-02-12 MED ORDER — ATORVASTATIN CALCIUM 40 MG PO TABS
40.0000 mg | ORAL_TABLET | Freq: Every day | ORAL | Status: DC
  Administered 2020-02-13: 40 mg via ORAL
  Filled 2020-02-12: qty 1

## 2020-02-12 MED ORDER — PANTOPRAZOLE SODIUM 40 MG PO TBEC
40.0000 mg | DELAYED_RELEASE_TABLET | Freq: Two times a day (BID) | ORAL | Status: DC
  Administered 2020-02-13 (×2): 40 mg via ORAL
  Filled 2020-02-12 (×2): qty 1

## 2020-02-12 MED ORDER — SODIUM CHLORIDE 0.9 % IV SOLN
4.0000 | Freq: Once | INTRAVENOUS | Status: DC
Start: 2020-02-12 — End: 2020-02-12

## 2020-02-12 MED ORDER — METOPROLOL TARTRATE 5 MG/5ML IV SOLN
5.0000 mg | Freq: Once | INTRAVENOUS | Status: AC
  Administered 2020-02-13: 5 mg via INTRAVENOUS
  Filled 2020-02-12: qty 5

## 2020-02-12 MED ORDER — CROTALIDAE POLYVAL IMMUNE FAB IV SOLR
5.0000 | Freq: Once | INTRAVENOUS | Status: DC
  Filled 2020-02-12: qty 90

## 2020-02-12 MED ORDER — SODIUM CHLORIDE 0.9 % IV SOLN: INTRAVENOUS | Status: DC

## 2020-02-12 MED ORDER — AZELASTINE HCL 0.1 % NA SOLN
1.0000 | Freq: Two times a day (BID) | NASAL | Status: DC | PRN
  Filled 2020-02-12: qty 30

## 2020-02-12 MED ORDER — HYDROCODONE-ACETAMINOPHEN 10-325 MG PO TABS: 1.0000 | ORAL_TABLET | Freq: Four times a day (QID) | ORAL | Status: DC | PRN

## 2020-02-12 MED ORDER — BUSPIRONE HCL 5 MG PO TABS
15.0000 mg | ORAL_TABLET | Freq: Two times a day (BID) | ORAL | Status: DC
  Administered 2020-02-13 (×2): 15 mg via ORAL
  Filled 2020-02-12 (×2): qty 3

## 2020-02-12 MED ORDER — DIAZEPAM 5 MG PO TABS: 10.0000 mg | ORAL_TABLET | Freq: Four times a day (QID) | ORAL | Status: DC | PRN

## 2020-02-12 MED ORDER — SODIUM CHLORIDE 0.9 % IV SOLN
4.0000 | Freq: Once | INTRAVENOUS | Status: DC
  Filled 2020-02-12: qty 72

## 2020-02-12 MED ORDER — TOPIRAMATE 100 MG PO TABS
200.0000 mg | ORAL_TABLET | Freq: Two times a day (BID) | ORAL | Status: DC
  Administered 2020-02-13 (×2): 200 mg via ORAL
  Filled 2020-02-12 (×2): qty 2

## 2020-02-12 MED ORDER — GABAPENTIN 400 MG PO CAPS
800.0000 mg | ORAL_CAPSULE | Freq: Four times a day (QID) | ORAL | Status: DC
  Administered 2020-02-13 (×3): 800 mg via ORAL
  Filled 2020-02-12 (×3): qty 2

## 2020-02-12 MED ORDER — SODIUM CHLORIDE 0.9 % IV SOLN
6.0000 | Freq: Once | INTRAVENOUS | Status: AC
  Administered 2020-02-12: 6 via INTRAVENOUS
  Filled 2020-02-12: qty 108

## 2020-02-12 MED ORDER — METOCLOPRAMIDE HCL 5 MG/5ML PO SOLN
5.0000 mg | Freq: Two times a day (BID) | ORAL | Status: DC
  Administered 2020-02-13 (×2): 5 mg via ORAL
  Filled 2020-02-12 (×2): qty 10

## 2020-02-12 NOTE — ED Notes (Addendum)
Pt measured at right ring finger: 7.5cm, wrist: 20cm, forearm: 24cm Pt reports tenderness to middle length of right ring finger with swelling and bruising to mid finger  Arm elevated on a BST  Poison controlled contacted
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
## 2020-02-12 NOTE — Progress Notes (Signed)
Patient ID: Brian [name], male   DOB: 06/14/1956, 64 y.o.   MRN: [id number] Copperhead snake bite   Hand   I informed Dr Roderic Palau that I have no experience with snake bites  But if hand surgery recommends he stay here I ll follow;  Dr Roderic Palau indicates Dr Grandville Silos recommends observation, possible debridement if needed, including amputation;  I m ok with debridement but have reservations about amputating the finger
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
## 2020-02-12 NOTE — ED Notes (Signed)
Right palm 68mm Right wrist 89mm Right forearm 26.65mm Right bicep 21mm
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
## 2020-02-12 NOTE — ED Notes (Signed)
Notified Poison control and recommended elevate extremity, keep arm straight, remove everything off of arm such as jewelry.  Measure Every 1 hour x 3 hours according to snake bite info sheet faxed to ED then every 2-3 hours.  Recommend crofab if swelling gets past r wrist.  Recommends 6 hour observation and optional labs.  Poison control was transferred to EDP and discussed with them.
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
## 2020-02-12 NOTE — ED Notes (Signed)
Right ring finger: 8cm; right wrist: 20cm; right forearm: 24cm; right upper arm: 27cm  Pt reports tenderness to palm of right hand  EDP at bedside to assess

## 2020-02-12 NOTE — Patient Instructions (Signed)
Brian [name]  02/12/2020     @PREFPERIOPPHARMACY @   Your procedure is scheduled on  02/15/2020.  Report to [facility] at  0800  A.M.  Call this number if you have problems the morning of surgery:  [phone]   Remember:  Follow the diet instructions given to you by Dr Roseanne Kaufman office.                         Take these medicines the morning of surgery with A SIP OF WATER  Amlodipine, celexa, valium(if needed), benadryl (if needed), gabapentin, hydrocodone(if needed), reglan, zofran(if needed), protonix, phenergan (if needed), topamax.    Do not wear jewelry, make-up or nail polish.  Do not wear lotions, powders, or perfumes. Please wear deodorant and brush your teeth.  Do not shave 48 hours prior to surgery.  Men may shave face and neck.  Do not bring valuables to the hospital.  [facility] is not responsible for any belongings or valuables.  Contacts, dentures or bridgework may not be worn into surgery.  Leave your suitcase in the car.  After surgery it may be brought to your room.  For patients admitted to the hospital, discharge time will be determined by your treatment team.  Patients discharged the day of surgery will not be allowed to drive home.   Name and phone number of your driver:   family Special instructions:  DO NOT smoke the morning of your procedure.  Please read over the following fact sheets that you were given. Anesthesia Post-op Instructions and Care and Recovery After Surgery       Upper Endoscopy, Adult, Care After This sheet gives you information about how to care for yourself after your procedure. Your health care provider may also give you more specific instructions. If you have problems or questions, contact your health care provider. What can I expect after the procedure? After the procedure, it is common to have:  A sore throat.  Mild stomach pain or discomfort.  Bloating.  Nausea. Follow these instructions at  home:   Follow instructions from your health care provider about what to eat or drink after your procedure.  Return to your normal activities as told by your health care provider. Ask your health care provider what activities are safe for you.  Take over-the-counter and prescription medicines only as told by your health care provider.  Do not drive for 24 hours if you were given a sedative during your procedure.  Keep all follow-up visits as told by your health care provider. This is important. Contact a health care provider if you have:  A sore throat that lasts longer than one day.  Trouble swallowing. Get help right away if:  You vomit blood or your vomit looks like coffee grounds.  You have: ? A fever. ? Bloody, black, or tarry stools. ? A severe sore throat or you cannot swallow. ? Difficulty breathing. ? Severe pain in your chest or abdomen. Summary  After the procedure, it is common to have a sore throat, mild stomach discomfort, bloating, and nausea.  Do not drive for 24 hours if you were given a sedative during the procedure.  Follow instructions from your health care provider about what to eat or drink after your procedure.  Return to your normal activities as told by your health care provider. This information is not intended to replace advice given to you by your  health care provider. Make sure you discuss any questions you have with your health care provider. Document Revised: 02/08/2018 Document Reviewed: 01/17/2018 Elsevier Patient Education  [place].  Esophageal Dilatation Esophageal dilatation, also called esophageal dilation, is a procedure to widen or open (dilate) a blocked or narrowed part of the esophagus. The esophagus is the part of the body that moves food and liquid from the mouth to the stomach. You may need this procedure if:  You have a buildup of scar tissue in your esophagus that makes it difficult, painful, or impossible to  swallow. This can be caused by gastroesophageal reflux disease (GERD).  You have cancer of the esophagus.  There is a problem with how food moves through your esophagus. In some cases, you may need this procedure repeated at a later time to dilate the esophagus gradually. Tell a health care provider about:  Any allergies you have.  All medicines you are taking, including vitamins, herbs, eye drops, creams, and over-the-counter medicines.  Any problems you or family members have had with anesthetic medicines.  Any blood disorders you have.  Any surgeries you have had.  Any medical conditions you have.  Any antibiotic medicines you are required to take before dental procedures.  Whether you are pregnant or may be pregnant. What are the risks? Generally, this is a safe procedure. However, problems may occur, including:  Bleeding due to a tear in the lining of the esophagus.  A hole (perforation) in the esophagus. What happens before the procedure?  Follow instructions from your health care provider about eating or drinking restrictions.  Ask your health care provider about changing or stopping your regular medicines. This is especially important if you are taking diabetes medicines or blood thinners.  Plan to have someone take you home from the hospital or clinic.  Plan to have a responsible adult care for you for at least 24 hours after you leave the hospital or clinic. This is important. What happens during the procedure?  You may be given a medicine to help you relax (sedative).  A numbing medicine may be sprayed into the back of your throat, or you may gargle the medicine.  Your health care provider may perform the dilatation using various surgical instruments, such as: ? Simple dilators. This instrument is carefully placed in the esophagus to stretch it. ? Guided wire bougies. This involves using an endoscope to insert a wire into the esophagus. A dilator is passed  over this wire to enlarge the esophagus. Then the wire is removed. ? Balloon dilators. An endoscope with a small balloon at the end is inserted into the esophagus. The balloon is inflated to stretch the esophagus and open it up. The procedure may vary among health care providers and hospitals. What happens after the procedure?  Your blood pressure, heart rate, breathing rate, and blood oxygen level will be monitored until the medicines you were given have worn off.  Your throat may feel slightly sore and numb. This will improve slowly over time.  You will not be allowed to eat or drink until your throat is no longer numb.  When you are able to drink, urinate, and sit on the edge of the bed without nausea or dizziness, you may be able to return home. Follow these instructions at home:  Take over-the-counter and prescription medicines only as told by your health care provider.  Do not drive for 24 hours if you were given a sedative during your procedure.  You should have a responsible adult with you for 24 hours after the procedure.  Follow instructions from your health care provider about any eating or drinking restrictions.  Do not use any products that contain nicotine or tobacco, such as cigarettes and e-cigarettes. If you need help quitting, ask your health care provider.  Keep all follow-up visits as told by your health care provider. This is important. Get help right away if you:  Have a fever.  Have chest pain.  Have pain that is not relieved by medication.  Have trouble breathing.  Have trouble swallowing.  Vomit blood. Summary  Esophageal dilatation, also called esophageal dilation, is a procedure to widen or open (dilate) a blocked or narrowed part of the esophagus.  Plan to have someone take you home from the hospital or clinic.  For this procedure, a numbing medicine may be sprayed into the back of your throat, or you may gargle the medicine.  Do not drive for  24 hours if you were given a sedative during your procedure. This information is not intended to replace advice given to you by your health care provider. Make sure you discuss any questions you have with your health care provider. Document Revised: 06/14/2019 Document Reviewed: 06/22/2017 Elsevier Patient Education  2020 [place] After These instructions provide you with information about caring for yourself after your procedure. Your health care provider may also give you more specific instructions. Your treatment has been planned according to current medical practices, but problems sometimes occur. Call your health care provider if you have any problems or questions after your procedure. What can I expect after the procedure? After your procedure, you may:  Feel sleepy for several hours.  Feel clumsy and have poor balance for several hours.  Feel forgetful about what happened after the procedure.  Have poor judgment for several hours.  Feel nauseous or vomit.  Have a sore throat if you had a breathing tube during the procedure. Follow these instructions at home: For at least 24 hours after the procedure:      Have a responsible adult stay with you. It is important to have someone help care for you until you are awake and alert.  Rest as needed.  Do not: ? Participate in activities in which you could fall or become injured. ? Drive. ? Use heavy machinery. ? Drink alcohol. ? Take sleeping pills or medicines that cause drowsiness. ? Make important decisions or sign legal documents. ? Take care of children on your own. Eating and drinking  Follow the diet that is recommended by your health care provider.  If you vomit, drink water, juice, or soup when you can drink without vomiting.  Make sure you have little or no nausea before eating solid foods. General instructions  Take over-the-counter and prescription medicines only as told by  your health care provider.  If you have sleep apnea, surgery and certain medicines can increase your risk for breathing problems. Follow instructions from your health care provider about wearing your sleep device: ? Anytime you are sleeping, including during daytime naps. ? While taking prescription pain medicines, sleeping medicines, or medicines that make you drowsy.  If you smoke, do not smoke without supervision.  Keep all follow-up visits as told by your health care provider. This is important. Contact a health care provider if:  You keep feeling nauseous or you keep vomiting.  You feel light-headed.  You develop a rash.  You have a  fever. Get help right away if:  You have trouble breathing. Summary  For several hours after your procedure, you may feel sleepy and have poor judgment.  Have a responsible adult stay with you for at least 24 hours or until you are awake and alert. This information is not intended to replace advice given to you by your health care provider. Make sure you discuss any questions you have with your health care provider. Document Revised: 11/15/2017 Document Reviewed: 12/08/2015 Elsevier Patient Education  [place].
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
## 2020-02-12 NOTE — ED Notes (Signed)
Right palm 17mm Right wrist 47mm Right forearm 61mm Right bicep 28.8mm

## 2020-02-12 NOTE — ED Notes (Signed)
Right ring finger: 9cm; right palm: 24.5cm; right wrist: 20cm; right forearm: 25.5cm; right upper arm: 27.5cm  Pt reports tenderness to right wrist   EDP at bedside to assess

## 2020-02-13 ENCOUNTER — Other Ambulatory Visit (HOSPITAL_COMMUNITY): Payer: Medicare HMO

## 2020-02-13 ENCOUNTER — Observation Stay (HOSPITAL_COMMUNITY): Payer: Medicare HMO

## 2020-02-13 ENCOUNTER — Encounter: Payer: Self-pay | Admitting: Orthopedic Surgery

## 2020-02-13 ENCOUNTER — Telehealth: Payer: Self-pay | Admitting: Internal Medicine

## 2020-02-13 ENCOUNTER — Encounter (HOSPITAL_COMMUNITY): Payer: Self-pay

## 2020-02-13 ENCOUNTER — Encounter (HOSPITAL_COMMUNITY)
Admission: RE | Admit: 2020-02-13 | Discharge: 2020-02-13 | Disposition: A | Discharge status: Home / Self Care | Payer: Medicare HMO | Source: Ambulatory Visit | Attending: Internal Medicine | Admitting: Internal Medicine

## 2020-02-13 DIAGNOSIS — T63001A Toxic effect of unspecified snake venom, accidental (unintentional), initial encounter: Secondary | ICD-10-CM | POA: Diagnosis not present

## 2020-02-13 DIAGNOSIS — R2232 Localized swelling, mass and lump, left upper limb: Secondary | ICD-10-CM

## 2020-02-13 DIAGNOSIS — G894 Chronic pain syndrome: Secondary | ICD-10-CM | POA: Diagnosis not present

## 2020-02-13 DIAGNOSIS — G40909 Epilepsy, unspecified, not intractable, without status epilepticus: Secondary | ICD-10-CM

## 2020-02-13 DIAGNOSIS — I10 Essential (primary) hypertension: Secondary | ICD-10-CM | POA: Diagnosis present

## 2020-02-13 HISTORY — DX: Localized swelling, mass and lump, left upper limb: R22.32

## 2020-02-13 LAB — CBC WITH DIFFERENTIAL/PLATELET
Abs Immature Granulocytes: 0.02 10*3/uL (ref 0.00–0.07)
Basophils Absolute: 0 10*3/uL (ref 0.0–0.1)
Basophils Relative: 0 %
Eosinophils Absolute: 0.4 10*3/uL (ref 0.0–0.5)
Eosinophils Relative: 4 %
HCT: 39.5 % (ref 39.0–52.0)
Hemoglobin: 13 g/dL (ref 13.0–17.0)
Immature Granulocytes: 0 %
Lymphocytes Relative: 23 %
Lymphs Abs: 2.3 10*3/uL (ref 0.7–4.0)
MCH: 32.9 pg (ref 26.0–34.0)
MCHC: 32.9 g/dL (ref 30.0–36.0)
MCV: 100 fL (ref 80.0–100.0)
Monocytes Absolute: 1.1 10*3/uL — ABNORMAL HIGH (ref 0.1–1.0)
Monocytes Relative: 11 %
Neutro Abs: 6.2 10*3/uL (ref 1.7–7.7)
Neutrophils Relative %: 62 %
Platelets: 199 10*3/uL (ref 150–400)
RBC: 3.95 MIL/uL — ABNORMAL LOW (ref 4.22–5.81)
RDW: 13.4 % (ref 11.5–15.5)
WBC: 10 10*3/uL (ref 4.0–10.5)
nRBC: 0 % (ref 0.0–0.2)

## 2020-02-13 LAB — COMPREHENSIVE METABOLIC PANEL
ALT: 20 U/L (ref 0–44)
AST: 15 U/L (ref 15–41)
Albumin: 3.8 g/dL (ref 3.5–5.0)
Alkaline Phosphatase: 62 U/L (ref 38–126)
Anion gap: 8 (ref 5–15)
BUN: 18 mg/dL (ref 8–23)
CO2: 22 mmol/L (ref 22–32)
Calcium: 8.3 mg/dL — ABNORMAL LOW (ref 8.9–10.3)
Chloride: 110 mmol/L (ref 98–111)
Creatinine, Ser: 1.25 mg/dL — ABNORMAL HIGH (ref 0.61–1.24)
GFR calc Af Amer: >60 mL/min (ref >60)
GFR calc non Af Amer: >60 mL/min (ref >60)
Glucose, Bld: 132 mg/dL — ABNORMAL HIGH (ref 70–99)
Potassium: 3.6 mmol/L (ref 3.5–5.1)
Sodium: 140 mmol/L (ref 135–145)
Total Bilirubin: 0.5 mg/dL (ref 0.3–1.2)
Total Protein: 6.6 g/dL (ref 6.5–8.1)

## 2020-02-13 LAB — ECHOCARDIOGRAM COMPLETE
Height: 72 in
Weight: 3232.01 oz

## 2020-02-13 LAB — HEMOGLOBIN A1C
Hgb A1c MFr Bld: 5.9 % — ABNORMAL HIGH (ref 4.8–5.6)
Mean Plasma Glucose: 122.63 mg/dL

## 2020-02-13 LAB — GLUCOSE, CAPILLARY
Glucose-Capillary: 98 mg/dL (ref 70–99)
Glucose-Capillary: 109 mg/dL — ABNORMAL HIGH (ref 70–99)

## 2020-02-13 LAB — PROTIME-INR
INR: 1.1 (ref 0.8–1.2)
Prothrombin Time: 14.1 seconds (ref 11.4–15.2)

## 2020-02-13 LAB — HIV ANTIBODY (ROUTINE TESTING W REFLEX): HIV Screen 4th Generation wRfx: NONREACTIVE

## 2020-02-13 LAB — APTT: aPTT: 29 seconds (ref 24–36)

## 2020-02-13 MED ORDER — NICOTINE 21 MG/24HR TD PT24: 21.0000 mg | MEDICATED_PATCH | Freq: Every day | TRANSDERMAL | Status: DC | PRN

## 2020-02-13 MED ORDER — LIDOCAINE 5 % EX PTCH
1.0000 | MEDICATED_PATCH | Freq: Every day | CUTANEOUS | Status: DC
  Administered 2020-02-13: 1 via TRANSDERMAL
  Filled 2020-02-13: qty 1

## 2020-02-13 MED ORDER — OXYCODONE-ACETAMINOPHEN 5-325 MG PO TABS
1.0000 | ORAL_TABLET | ORAL | Status: DC | PRN
  Administered 2020-02-13: 1 via ORAL
  Filled 2020-02-13: qty 1

## 2020-02-13 MED ORDER — HYDROCODONE-ACETAMINOPHEN 10-325 MG PO TABS: 1.0000 | ORAL_TABLET | ORAL | Status: DC | PRN

## 2020-02-13 MED ORDER — IBUPROFEN 800 MG PO TABS
800.0000 mg | ORAL_TABLET | Freq: Four times a day (QID) | ORAL | Status: DC
  Administered 2020-02-13: 800 mg via ORAL
  Filled 2020-02-13: qty 1

## 2020-02-13 NOTE — Discharge Summary (Addendum)
Physician Discharge Summary  Brian [name] OZH:[id number] DOB: [birth year] DOA: 02/12/2020  PCP: Vidal Schwalbe, MD  Admit date: 02/12/2020 Discharge date: 02/13/2020  Admitted From: home Disposition:  home  Recommendations for Outpatient Follow-up:  1. Follow up with PCP in 1-2 weeks 2. Please obtain BMP/CBC in one week 3. Follow up with orthopedics, Dr. Aline Brochure in 1 week 4. Keep right arm elevated, no ice, continue pain management  Home Health: Equipment/Devices:  Discharge Condition:stable CODE STATUS:full code Diet recommendation: heart healthy, carb modified  Brief/Interim Summary: 64 year old male with a history of hypertension, diabetes, chronic abdominal pain, seizure disorder, sleep apnea, was brought to the emergency room after he had a copperhead snake bite to his right hand, fourth digit.  Patient was evaluated in the emergency room where his case was discussed with poison control.  He received CroFab.  He has been referred for admission for further observation.  Discharge Diagnoses:  Principal Problem:   Poisonous snake bite Active Problems:   GERD (gastroesophageal reflux disease)   Gastroparesis   Tobacco abuse   Diabetes type 2, controlled (HCC)   Neuropathic pain of left forearm   Seizure disorder (HCC)   Chronic pain syndrome   Elevated serum creatinine   Essential hypertension   Mass of left hand  1. Snakebite from copperhead.  Patient was noted to have right hand, fourth digit snakebite.  He had developed a hemorrhagic blister near his nail bed.  Is swelling in his upper extremity was monitored and by the following day was noted to be decreasing.  Pain appears to be reasonably controlled.  His case was discussed with hand surgery who felt appropriate to keep the patient at [facility].  He was seen in consultation by Dr. Aline Brochure, orthopedics who agreed with current management.  Since his pain is reasonably controlled and overall swelling is improving, it was  felt reasonable to discharge the patient home with plans for outpatient follow-up in 1 week.  This was also reviewed with poison control who agreed with management.  He was advised to keep his upper extremity elevated. 2. Left hand mass.  Noted on x-ray imaging.  Full follow-up with orthopedics for further work-up. 3. Acute kidney injury.  ARB held on admission.  He received IV fluids with some improvement of renal function.  We will continue to hold ARB for now.  Would avoid NSAIDs. 4. Seizure disorder.  Continue home medications. 5. Diabetes.  Blood sugars currently stable on the hospitalization.  Continue outpatient regimen 6. Remainder of medical problems remained stable.  Discharge Instructions  Discharge Instructions    Diet - low sodium heart healthy   Complete by: As directed    Discharge wound care:   Complete by: As directed    Keep finger clean and dry with gauze   Increase activity slowly   Complete by: As directed      Allergies as of 02/13/2020      Reactions   Ketorolac Tromethamine Other (See Comments)   Renal failure   Doxycycline Rash   Sulfamethoxazole Rash   Tetracyclines & Related Rash      Medication List    STOP taking these medications   losartan 100 MG tablet Commonly known as: COZAAR     TAKE these medications   amitriptyline 100 MG tablet Commonly known as: ELAVIL Take 100 mg by mouth at bedtime.   amLODipine 5 MG tablet Commonly known as: NORVASC Take 5 mg by mouth daily.   aspirin EC 81 MG  tablet Take 81 mg by mouth daily.   azelastine 0.1 % nasal spray Commonly known as: ASTELIN Place 1 spray into both nostrils 2 (two) times daily as needed for rhinitis or allergies.   baclofen 10 MG tablet Commonly known as: LIORESAL Take 10 mg by mouth 3 (three) times daily.   Biotin 5000 MCG Caps Take 1 capsule by mouth daily.   busPIRone 15 MG tablet Commonly known as: BUSPAR Take 15 mg 2 (two) times daily by mouth.   calcium carbonate  1250 (500 Ca) MG tablet Commonly known as: OS-CAL - dosed in mg of elemental calcium Take 1 tablet by mouth daily.   citalopram 20 MG tablet Commonly known as: CELEXA Take 20 mg by mouth daily.   Cranberry 405 MG Caps Take 405 mg by mouth daily.   diazepam 10 MG tablet Commonly known as: VALIUM Take 10 mg by mouth every 6 (six) hours as needed for anxiety.   diphenhydrAMINE 25 MG tablet Commonly known as: BENADRYL Take 25 mg by mouth daily.   Fish Oil 1000 MG Caps Take 1,000 mg by mouth daily.   gabapentin 800 MG tablet Commonly known as: NEURONTIN Take 800 mg by mouth 4 (four) times daily.   HM Potassium 595 (99 K) MG Tabs tablet Generic drug: potassium gluconate Take 595 mg by mouth daily.   HYDROcodone-acetaminophen 10-325 MG tablet Commonly known as: NORCO Take 1 tablet by mouth every 6 (six) hours as needed for severe pain.   hyoscyamine 0.125 MG Tbdp disintergrating tablet Commonly known as: ANASPAZ Place 0.125 mg under the tongue every 6 (six) hours as needed for cramping. What changed: Another medication with the same name was removed. Continue taking this medication, and follow the directions you see here.   ketoconazole 2 % cream Commonly known as: NIZORAL Apply 1 application topically daily as needed for irritation.   metoCLOPramide 5 MG/5ML solution Commonly known as: REGLAN Take 5 mLs (5 mg total) by mouth 2 (two) times daily. Before breakfast and at bedtime   MILK THISTLE EXTRACT PO Take 1 capsule by mouth daily. Takes 100 mg daily   multivitamin capsule Take 1 capsule by mouth daily.   ondansetron 8 MG tablet Commonly known as: ZOFRAN Take 1 tablet (8 mg total) by mouth every 8 (eight) hours as needed for nausea or vomiting. TAKE 1 TABLET TWICE DAILY AS NEEDED FOR NAUSEA OR VOMITING   pantoprazole 40 MG tablet Commonly known as: PROTONIX Take 40 mg by mouth 2 (two) times daily.   PROBIOTIC DAILY PO Take 1 capsule by mouth daily.    promethazine 25 MG tablet Commonly known as: PHENERGAN Take 25 mg by mouth every 6 (six) hours as needed for nausea or vomiting.   simvastatin 80 MG tablet Commonly known as: ZOCOR Take 80 mg by mouth every evening.   sucralfate 1 g tablet Commonly known as: CARAFATE Take 1 g by mouth 4 (four) times daily -  with meals and at bedtime.   topiramate 100 MG tablet Commonly known as: TOPAMAX Take 200 mg by mouth 2 (two) times daily.   vitamin B-12 1000 MCG tablet Commonly known as: CYANOCOBALAMIN Take 2,000 mcg daily by mouth.   vitamin C 500 MG tablet Commonly known as: ASCORBIC ACID Take 500 mg by mouth daily.   Vitamin D (Ergocalciferol) 1.25 MG (50000 UNIT) Caps capsule Commonly known as: DRISDOL Take 50,000 Units by mouth every Sunday.   vitamin E 180 MG (400 UNITS) capsule Take 400 Units by  mouth daily.            Discharge Care Instructions  (From admission, onward)         Start     Ordered   02/13/20 0000  Discharge wound care:       Comments: Keep finger clean and dry with gauze   02/13/20 1529          Follow-up Information    Carole Civil, MD. Schedule an appointment as soon as possible for a visit in 1 week(s).   Specialties: Orthopedic Surgery, Radiology Contact information: [address] [place] [address] [phone]              Allergies  Allergen Reactions  . Ketorolac Tromethamine Other (See Comments)    Renal failure  . Doxycycline Rash  . Sulfamethoxazole Rash  . Tetracyclines & Related Rash    Consultations:  orthopedics   Procedures/Studies: DG Hand Complete Left  Result Date: 02/13/2020 CLINICAL DATA:  Palpable knot on middle finger for 40 years that has gotten bigger and is painful at times. EXAM: LEFT HAND - COMPLETE 3+ VIEW COMPARISON:  None. FINDINGS: The mineralization and alignment are normal. There is no evidence of acute fracture or dislocation. There is an old fracture of the 5th  metacarpal neck. There are minimal interphalangeal and intercarpal degenerative changes. There is focal soft tissue swelling or a soft tissue mass palmar to the 3rd middle phalanx, measuring up to 1.6 cm on the lateral view. There is possible mild soft tissue swelling along the radial aspect of the distal 2nd digit as well. A tiny metallic foreign body is seen dorsal to the 3rd proximal phalanx. IMPRESSION: 1. Focal soft tissue swelling or soft tissue mass palmar to the 3rd middle phalanx, nonspecific. Consider further evaluation with ultrasound or MRI. 2. Possible soft tissue swelling along the radial aspect of the distal 2nd digit. 3. No acute osseous findings. Electronically Signed   By: Richardean Sale M.D.   On: 02/13/2020 13:54   DG Finger Ring Right  Result Date: 02/12/2020 CLINICAL DATA:  Snake bite. EXAM: RIGHT RING FINGER 2+V COMPARISON:  None. FINDINGS: There is no evidence of fracture or dislocation. There is no evidence of arthropathy or other focal bone abnormality. Soft tissues are unremarkable. IMPRESSION: Negative. Electronically Signed   By: Marijo Conception M.D.   On: 02/12/2020 16:44   ECHOCARDIOGRAM COMPLETE  Result Date: 02/13/2020    ECHOCARDIOGRAM REPORT   Patient Name:   ANSELMO REIHL Riva Date of Exam: 02/13/2020 Medical Rec #:  [id number]      Height:       72.0 in Accession #:    [id number]     Weight:       202.0 lb Date of Birth:  [birth year]      BSA:          2.140 m Patient Age:    64 years       BP:           136/82 mmHg Patient Gender: M              HR:           67 bpm. Exam Location:  [facility] Procedure: 2D Echo Indications:    Murmur 785.2 / R01.1  History:        Patient has prior history of Echocardiogram examinations, most  recent 07/10/2017. Signs/Symptoms:Dyspnea and Syncope; Risk                 Factors:Hypertension, Diabetes and Current Smoker. Posinous                 snake bite, GERD, LVH (left ventricular hypertrophy.  Sonographer:    Leavy Cella  RDCS (AE) Referring Phys: [id number] DAVID MANUEL [place]  1. Left ventricular ejection fraction, by estimation, is 60 to 65%. The left ventricle has normal function. The left ventricle has no regional wall motion abnormalities. There is mild left ventricular hypertrophy. Left ventricular diastolic parameters were normal.  2. Right ventricular systolic function is normal. The right ventricular size is normal.  3. The mitral valve is normal in structure. Trivial mitral valve regurgitation. No evidence of mitral stenosis.  4. The aortic valve is tricuspid. Aortic valve regurgitation is trivial. Mild aortic valve sclerosis is present, with no evidence of aortic valve stenosis.  5. The inferior vena cava is normal in size with greater than 50% respiratory variability, suggesting right atrial pressure of 3 mmHg. FINDINGS  Left Ventricle: Left ventricular ejection fraction, by estimation, is 60 to 65%. The left ventricle has normal function. The left ventricle has no regional wall motion abnormalities. The left ventricular internal cavity size was normal in size. There is  mild left ventricular hypertrophy. Left ventricular diastolic parameters were normal. Right Ventricle: The right ventricular size is normal. No increase in right ventricular wall thickness. Right ventricular systolic function is normal. Left Atrium: Left atrial size was normal in size. Right Atrium: Right atrial size was normal in size. Pericardium: There is no evidence of pericardial effusion. Mitral Valve: The mitral valve is normal in structure. There is mild thickening of the mitral valve leaflet(s). Normal mobility of the mitral valve leaflets. Trivial mitral valve regurgitation. No evidence of mitral valve stenosis. Tricuspid Valve: The tricuspid valve is normal in structure. Tricuspid valve regurgitation is not demonstrated. No evidence of tricuspid stenosis. Aortic Valve: The aortic valve is tricuspid. Aortic valve regurgitation is  trivial. Aortic regurgitation PHT measures 353 msec. Mild aortic valve sclerosis is present, with no evidence of aortic valve stenosis. Pulmonic Valve: The pulmonic valve was normal in structure. Pulmonic valve regurgitation is not visualized. No evidence of pulmonic stenosis. Aorta: The aortic root is normal in size and structure. Venous: The inferior vena cava is normal in size with greater than 50% respiratory variability, suggesting right atrial pressure of 3 mmHg. IAS/Shunts: No atrial level shunt detected by color flow Doppler.  LEFT VENTRICLE PLAX 2D LVIDd:         4.55 cm  Diastology LVIDs:         2.43 cm  LV e' lateral:   5.66 cm/s LV PW:         1.12 cm  LV E/e' lateral: 14.5 LV IVS:        1.21 cm  LV e' medial:    7.62 cm/s LVOT diam:     2.00 cm  LV E/e' medial:  10.8 LVOT Area:     3.14 cm  RIGHT VENTRICLE RV S prime:     14.50 cm/s TAPSE (M-mode): 2.4 cm LEFT ATRIUM             Index       RIGHT ATRIUM           Index LA diam:        3.60 cm 1.68 cm/m  RA Area:  13.00 cm LA Vol (A2C):   57.9 ml 27.06 ml/m RA Volume:   25.10 ml  11.73 ml/m LA Vol (A4C):   55.0 ml 25.71 ml/m LA Biplane Vol: 56.0 ml 26.17 ml/m  AORTIC VALVE AI PHT:      353 msec  AORTA Ao Root diam: 3.20 cm MITRAL VALVE MV Area (PHT): 2.69 cm    SHUNTS MV Decel Time: 282 msec    Systemic Diam: 2.00 cm MV E velocity: 82.00 cm/s MV A velocity: 97.00 cm/s MV E/A ratio:  0.85 [name] Rouge MD Electronically signed by [name] Rouge MD Signature Date/Time: 02/13/2020/11:43:13 AM    Final       Subjective: Feels that swelling is improving. He has noted some bloody discharged from hemorrhagic blister on right 4th digit. Pain seems to be controlled  Discharge Exam: Vitals:   02/12/20 2356 02/13/20 0438 02/13/20 0817 02/13/20 1210  BP: (!) 169/78 (!) 150/73 136/82 123/83  Pulse: 61 65 67 62  Resp: 20 20 17 16   Temp: 98 F (36.7 C) 98.1 F (36.7 C) 98.8 F (37.1 C) 98.3 F (36.8 C)  TempSrc: Oral Oral Oral Oral  SpO2:  93% 94% 96% 95%  Weight:      Height:        General: Pt is alert, awake, not in acute distress Cardiovascular: RRR, S1/S2 +, no rubs, no gallops Respiratory: CTA bilaterally, no wheezing, no rhonchi Abdominal: Soft, NT, ND, bowel sounds + Extremities: right 4th digit with hemorrhagic blister near nail bed.     The results of significant diagnostics from this hospitalization (including imaging, microbiology, ancillary and laboratory) are listed below for reference.     Microbiology: Recent Results (from the past 240 hour(s))  SARS Coronavirus 2 by RT PCR (hospital order, performed in [facility] hospital lab) Nasopharyngeal Nasopharyngeal Swab     Status: None   Collection Time: 02/12/20 10:31 PM   Specimen: Nasopharyngeal Swab  Result Value Ref Range Status   SARS Coronavirus 2 NEGATIVE NEGATIVE Final    Comment: (NOTE) SARS-CoV-2 target nucleic acids are NOT DETECTED.  The SARS-CoV-2 RNA is generally detectable in upper and lower respiratory specimens during the acute phase of infection. The lowest concentration of SARS-CoV-2 viral copies this assay can detect is 250 copies / mL. A negative result does not preclude SARS-CoV-2 infection and should not be used as the sole basis for treatment or other patient management decisions.  A negative result may occur with improper specimen collection / handling, submission of specimen other than nasopharyngeal swab, presence of viral mutation(s) within the areas targeted by this assay, and inadequate number of viral copies (<250 copies / mL). A negative result must be combined with clinical observations, patient history, and epidemiological information.  Fact Sheet for Patients:   StrictlyIdeas.no  Fact Sheet for Healthcare Providers: BankingDealers.co.za  This test is not yet approved or  cleared by the [place] FDA and has been authorized for detection and/or diagnosis of  SARS-CoV-2 by FDA under an Emergency Use Authorization (EUA).  This EUA will remain in effect (meaning this test can be used) for the duration of the COVID-19 declaration under Section 564(b)(1) of the Act, 21 U.S.C. section 360bbb-3(b)(1), unless the authorization is terminated or revoked sooner.  Performed at [facility], [address] [place], [place] [address]      Labs: BNP (last 3 results) No results for input(s): BNP in the last 8760 hours. Basic Metabolic Panel: Recent Labs  Lab 02/12/20 1639 02/13/20  0555  NA 141 140  K 3.8 3.6  CL 111 110  CO2 21* 22  GLUCOSE 108* 132*  BUN 22 18  CREATININE 1.52* 1.25*  CALCIUM 9.1 8.3*   Liver Function Tests: Recent Labs  Lab 02/12/20 1639 02/13/20 0555  AST 15 15  ALT 21 20  ALKPHOS 67 62  BILITOT 0.6 0.5  PROT 7.8 6.6  ALBUMIN 4.4 3.8   No results for input(s): LIPASE, AMYLASE in the last 168 hours. No results for input(s): AMMONIA in the last 168 hours. CBC: Recent Labs  Lab 02/12/20 1639 02/12/20 2151 02/13/20 0555  WBC 8.7  --  10.0  NEUTROABS 5.5  --  6.2  HGB 13.7  --  13.0  HCT 40.9  --  39.5  MCV 97.6  --  100.0  PLT 225 203 199   Cardiac Enzymes: No results for input(s): CKTOTAL, CKMB, CKMBINDEX, TROPONINI in the last 168 hours. BNP: Invalid input(s): POCBNP CBG: Recent Labs  Lab 02/13/20 0814 02/13/20 1208  GLUCAP 98 109*   D-Dimer No results for input(s): DDIMER in the last 72 hours. Hgb A1c Recent Labs    02/13/20 0555  HGBA1C 5.9*   Lipid Profile No results for input(s): CHOL, HDL, LDLCALC, TRIG, CHOLHDL, LDLDIRECT in the last 72 hours. Thyroid function studies No results for input(s): TSH, T4TOTAL, T3FREE, THYROIDAB in the last 72 hours.  Invalid input(s): FREET3 Anemia work up No results for input(s): VITAMINB12, FOLATE, FERRITIN, TIBC, IRON, RETICCTPCT in the last 72 hours. Urinalysis    Component Value Date/Time   COLORURINE AMBER (A) 03/13/2019 2200   APPEARANCEUR  HAZY (A) 03/13/2019 2200   LABSPEC 1.028 03/13/2019 2200   PHURINE 5.0 03/13/2019 2200   GLUCOSEU NEGATIVE 03/13/2019 2200   HGBUR NEGATIVE 03/13/2019 2200   BILIRUBINUR NEGATIVE 03/13/2019 2200   KETONESUR 5 (A) 03/13/2019 2200   PROTEINUR NEGATIVE 03/13/2019 2200   UROBILINOGEN 0.2 03/13/2015 1912   NITRITE NEGATIVE 03/13/2019 2200   LEUKOCYTESUR NEGATIVE 03/13/2019 2200   Sepsis Labs Invalid input(s): PROCALCITONIN,  WBC,  LACTICIDVEN Microbiology Recent Results (from the past 240 hour(s))  SARS Coronavirus 2 by RT PCR (hospital order, performed in [place] hospital lab) Nasopharyngeal Nasopharyngeal Swab     Status: None   Collection Time: 02/12/20 10:31 PM   Specimen: Nasopharyngeal Swab  Result Value Ref Range Status   SARS Coronavirus 2 NEGATIVE NEGATIVE Final    Comment: (NOTE) SARS-CoV-2 target nucleic acids are NOT DETECTED.  The SARS-CoV-2 RNA is generally detectable in upper and lower respiratory specimens during the acute phase of infection. The lowest concentration of SARS-CoV-2 viral copies this assay can detect is 250 copies / mL. A negative result does not preclude SARS-CoV-2 infection and should not be used as the sole basis for treatment or other patient management decisions.  A negative result may occur with improper specimen collection / handling, submission of specimen other than nasopharyngeal swab, presence of viral mutation(s) within the areas targeted by this assay, and inadequate number of viral copies (<250 copies / mL). A negative result must be combined with clinical observations, patient history, and epidemiological information.  Fact Sheet for Patients:   StrictlyIdeas.no  Fact Sheet for Healthcare Providers: BankingDealers.co.za  This test is not yet approved or  cleared by the [place] FDA and has been authorized for detection and/or diagnosis of SARS-CoV-2 by FDA under an Emergency Use  Authorization (EUA).  This EUA will remain in effect (meaning this test can be used) for  the duration of the COVID-19 declaration under Section 564(b)(1) of the Act, 21 U.S.C. section 360bbb-3(b)(1), unless the authorization is terminated or revoked sooner.  Performed at [facility], [address], [place], [place] [address]      Time coordinating discharge: 19mins  SIGNED:   Kathie Dike, MD  [facility] 02/13/2020, 3:49 PM   If 7PM-7AM, please contact night-coverage [email or web address]

## 2020-02-13 NOTE — Telephone Encounter (Signed)
Cancel procedure.  Office visit in a couple of weeks prior to rescheduling

## 2020-02-13 NOTE — Telephone Encounter (Signed)
Dr. Gala Romney, is it ok for [place] to proceed with procedure?

## 2020-02-13 NOTE — Plan of Care (Signed)
  Problem: Education: Goal: Knowledge of General Education information will improve Description Including pain rating scale, medication(s)/side effects and non-pharmacologic comfort measures Outcome: Progressing   Problem: Health Behavior/Discharge Planning: Goal: Ability to manage health-related needs will improve Outcome: Progressing   

## 2020-02-13 NOTE — Pre-Procedure Instructions (Signed)
Note sent to Dr Gala Romney concerning patient being an inpatient and if he wants to proceed with EGD on 02/15/2020.

## 2020-02-13 NOTE — Progress Notes (Addendum)
Right upper arm 24 cm Right forearm 22 cm  Right wrist 18 cm

## 2020-02-13 NOTE — Progress Notes (Signed)
Nsg Discharge Note  Admit Date:  02/12/2020 Discharge date: 02/13/2020   Brian [name] to be D/C'd Home per MD order.  AVS completed.  Copy for chart, and copy for patient signed, and dated. Removed IV-clean, dry, intact. Reviewed d/c paperwork with patient. Answered all questions. Wheeled stable patient and belongings to main entrance where he was picked up by his mother to d/c to home. Patient/caregiver able to verbalize understanding.  Discharge Medication: Allergies as of 02/13/2020      Reactions   Ketorolac Tromethamine Other (See Comments)   Renal failure   Doxycycline Rash   Sulfamethoxazole Rash   Tetracyclines & Related Rash      Medication List    STOP taking these medications   losartan 100 MG tablet Commonly known as: COZAAR     TAKE these medications   amitriptyline 100 MG tablet Commonly known as: ELAVIL Take 100 mg by mouth at bedtime.   amLODipine 5 MG tablet Commonly known as: NORVASC Take 5 mg by mouth daily.   aspirin EC 81 MG tablet Take 81 mg by mouth daily.   azelastine 0.1 % nasal spray Commonly known as: ASTELIN Place 1 spray into both nostrils 2 (two) times daily as needed for rhinitis or allergies.   baclofen 10 MG tablet Commonly known as: LIORESAL Take 10 mg by mouth 3 (three) times daily.   Biotin 5000 MCG Caps Take 1 capsule by mouth daily.   busPIRone 15 MG tablet Commonly known as: BUSPAR Take 15 mg 2 (two) times daily by mouth.   calcium carbonate 1250 (500 Ca) MG tablet Commonly known as: OS-CAL - dosed in mg of elemental calcium Take 1 tablet by mouth daily.   citalopram 20 MG tablet Commonly known as: CELEXA Take 20 mg by mouth daily.   Cranberry 405 MG Caps Take 405 mg by mouth daily.   diazepam 10 MG tablet Commonly known as: VALIUM Take 10 mg by mouth every 6 (six) hours as needed for anxiety.   diphenhydrAMINE 25 MG tablet Commonly known as: BENADRYL Take 25 mg by mouth daily.   Fish Oil 1000 MG Caps Take  1,000 mg by mouth daily.   gabapentin 800 MG tablet Commonly known as: NEURONTIN Take 800 mg by mouth 4 (four) times daily.   HM Potassium 595 (99 K) MG Tabs tablet Generic drug: potassium gluconate Take 595 mg by mouth daily.   HYDROcodone-acetaminophen 10-325 MG tablet Commonly known as: NORCO Take 1 tablet by mouth every 6 (six) hours as needed for severe pain.   hyoscyamine 0.125 MG Tbdp disintergrating tablet Commonly known as: ANASPAZ Place 0.125 mg under the tongue every 6 (six) hours as needed for cramping. What changed: Another medication with the same name was removed. Continue taking this medication, and follow the directions you see here.   ketoconazole 2 % cream Commonly known as: NIZORAL Apply 1 application topically daily as needed for irritation.   metoCLOPramide 5 MG/5ML solution Commonly known as: REGLAN Take 5 mLs (5 mg total) by mouth 2 (two) times daily. Before breakfast and at bedtime   MILK THISTLE EXTRACT PO Take 1 capsule by mouth daily. Takes 100 mg daily   multivitamin capsule Take 1 capsule by mouth daily.   ondansetron 8 MG tablet Commonly known as: ZOFRAN Take 1 tablet (8 mg total) by mouth every 8 (eight) hours as needed for nausea or vomiting. TAKE 1 TABLET TWICE DAILY AS NEEDED FOR NAUSEA OR VOMITING   pantoprazole 40 MG tablet  Commonly known as: PROTONIX Take 40 mg by mouth 2 (two) times daily.   PROBIOTIC DAILY PO Take 1 capsule by mouth daily.   promethazine 25 MG tablet Commonly known as: PHENERGAN Take 25 mg by mouth every 6 (six) hours as needed for nausea or vomiting.   simvastatin 80 MG tablet Commonly known as: ZOCOR Take 80 mg by mouth every evening.   sucralfate 1 g tablet Commonly known as: CARAFATE Take 1 g by mouth 4 (four) times daily -  with meals and at bedtime.   topiramate 100 MG tablet Commonly known as: TOPAMAX Take 200 mg by mouth 2 (two) times daily.   vitamin B-12 1000 MCG tablet Commonly known as:  CYANOCOBALAMIN Take 2,000 mcg daily by mouth.   vitamin C 500 MG tablet Commonly known as: ASCORBIC ACID Take 500 mg by mouth daily.   Vitamin D (Ergocalciferol) 1.25 MG (50000 UNIT) Caps capsule Commonly known as: DRISDOL Take 50,000 Units by mouth every Sunday.   vitamin E 180 MG (400 UNITS) capsule Take 400 Units by mouth daily.            Discharge Care Instructions  (From admission, onward)         Start     Ordered   02/13/20 0000  Discharge wound care:       Comments: Keep finger clean and dry with gauze   02/13/20 1529          Discharge Assessment: Vitals:   02/13/20 0817 02/13/20 1210  BP: 136/82 123/83  Pulse: 67 62  Resp: 17 16  Temp: 98.8 F (37.1 C) 98.3 F (36.8 C)  SpO2: 96% 95%   Skin clean, dry and intact without evidence of skin break down, no evidence of skin tears noted. IV catheter discontinued intact. Site without signs and symptoms of complications - no redness or edema noted at insertion site, patient denies c/o pain - only slight tenderness at site.  Dressing with slight pressure applied.  D/c Instructions-Education: Discharge instructions given to patient/family with verbalized understanding. D/c education completed with patient/family including follow up instructions, medication list, d/c activities limitations if indicated, with other d/c instructions as indicated by MD - patient able to verbalize understanding, all questions fully answered. Patient instructed to return to ED, call 911, or call MD for any changes in condition.  Patient escorted via [place], and D/C home via private auto.  Santa Lighter, RN 02/13/2020 4:52 PM
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
## 2020-02-13 NOTE — Telephone Encounter (Signed)
Endo scheduler called office. RMR had also sent message to pre-op nurse to cancel procedure.  Called and informed pt, RMR wants to cancel procedure and he will need OV if couple weeks prior to rescheduling.  Routing to SS to schedule OV.
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
## 2020-02-14 ENCOUNTER — Telehealth: Payer: Self-pay | Admitting: Orthopedic Surgery

## 2020-02-14 ENCOUNTER — Encounter: Payer: Self-pay | Admitting: Internal Medicine

## 2020-02-14 NOTE — Telephone Encounter (Signed)
Spoke with patient, and he proceeded to tell me that right hand was still swollen and earlier it felt hot to touch, denies redness. Advised patient to come in for an appointment with Dr. Aline Brochure 02/15/20 at 3:30 pm. Reiterated to patient to clean with soap (advised anti bacterial soap was ok) and water, keep dry, and keep elevated without ice above the heart per discharge instructions.

## 2020-02-14 NOTE — Telephone Encounter (Signed)
OV made and letter mailed °
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
## 2020-02-15 ENCOUNTER — Encounter: Payer: Self-pay | Admitting: Orthopedic Surgery

## 2020-02-15 ENCOUNTER — Encounter (HOSPITAL_COMMUNITY): Admission: RE | Payer: Self-pay | Source: Home / Self Care

## 2020-02-15 ENCOUNTER — Ambulatory Visit: Payer: Medicare HMO | Admitting: Orthopedic Surgery

## 2020-02-15 ENCOUNTER — Other Ambulatory Visit: Payer: Self-pay

## 2020-02-15 ENCOUNTER — Ambulatory Visit (HOSPITAL_COMMUNITY): Admission: RE | Admit: 2020-02-15 | Payer: Medicare HMO | Source: Home / Self Care | Admitting: Internal Medicine

## 2020-02-15 VITALS — BP 135/76 | HR 79 | Ht 72.0 in | Wt 208.0 lb

## 2020-02-15 DIAGNOSIS — T63001D Toxic effect of unspecified snake venom, accidental (unintentional), subsequent encounter: Secondary | ICD-10-CM | POA: Diagnosis not present

## 2020-02-15 DIAGNOSIS — W5911XD Bitten by nonvenomous snake, subsequent encounter: Secondary | ICD-10-CM

## 2020-02-15 SURGERY — ESOPHAGOGASTRODUODENOSCOPY (EGD) WITH PROPOFOL
Anesthesia: Monitor Anesthesia Care

## 2020-02-15 NOTE — Progress Notes (Signed)
F/u snake bite no surgery  Encounter Diagnosis  Name Primary?  . Snake bite, subsequent encounter Yes    pics in media   Snakebite right [place] finger primarily the distal tip see pictures in media looks like it is going to be viable  He actually has full range of motion of the MP and PIP joint decreased range of motion DIP joint  The dorsum of the area of the bite has a area of skin breakdown there.  But his redness granulating should do well   Encounter Diagnosis  Name Primary?  . Snake bite, subsequent encounter Yes

## 2020-02-15 NOTE — Patient Instructions (Signed)
Change dressing as needed   Return 1 week

## 2020-02-21 ENCOUNTER — Ambulatory Visit: Payer: Medicare HMO | Admitting: Orthopedic Surgery

## 2020-02-26 ENCOUNTER — Other Ambulatory Visit: Payer: Self-pay

## 2020-02-26 ENCOUNTER — Ambulatory Visit (INDEPENDENT_AMBULATORY_CARE_PROVIDER_SITE_OTHER): Payer: Medicare HMO | Admitting: Orthopedic Surgery

## 2020-02-26 ENCOUNTER — Encounter: Payer: Self-pay | Admitting: Orthopedic Surgery

## 2020-02-26 VITALS — BP 121/63 | HR 69 | Ht 72.0 in | Wt 200.0 lb

## 2020-02-26 DIAGNOSIS — W5911XD Bitten by nonvenomous snake, subsequent encounter: Secondary | ICD-10-CM

## 2020-02-26 DIAGNOSIS — R2232 Localized swelling, mass and lump, left upper limb: Secondary | ICD-10-CM
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
## 2020-02-26 NOTE — Patient Instructions (Signed)
Band aid and neosporin daily until it closes

## 2020-02-29 ENCOUNTER — Telehealth: Payer: Self-pay | Admitting: Internal Medicine

## 2020-02-29 NOTE — Telephone Encounter (Signed)
I cancelled OV for AUG since he was being moved up to something sooner.

## 2020-02-29 NOTE — Telephone Encounter (Signed)
Pt has OV for 8/9 and is aware he is on a cancellation list. He said his stools are black and wanted to speak with the nurse. [phone]

## 2020-02-29 NOTE — Telephone Encounter (Signed)
Pt called back. Pt will keep apt scheduled for 03/05/20 @ 3:00 PM with EG.

## 2020-03-05 ENCOUNTER — Other Ambulatory Visit (HOSPITAL_COMMUNITY): Payer: Self-pay | Admitting: Neurology

## 2020-03-05 ENCOUNTER — Ambulatory Visit: Payer: Medicare HMO | Admitting: Nurse Practitioner

## 2020-03-05 ENCOUNTER — Ambulatory Visit (HOSPITAL_COMMUNITY)
Admission: RE | Admit: 2020-03-05 | Discharge: 2020-03-05 | Disposition: A | Discharge status: Home / Self Care | Payer: Medicare HMO | Source: Ambulatory Visit | Attending: Neurology | Admitting: Neurology

## 2020-03-05 ENCOUNTER — Encounter: Payer: Self-pay | Admitting: Nurse Practitioner

## 2020-03-05 ENCOUNTER — Other Ambulatory Visit: Payer: Self-pay

## 2020-03-05 VITALS — BP 109/60 | HR 76 | Temp 97.5°F | Ht 72.0 in | Wt 208.8 lb

## 2020-03-05 DIAGNOSIS — M545 Low back pain, unspecified: Secondary | ICD-10-CM

## 2020-03-05 DIAGNOSIS — K219 Gastro-esophageal reflux disease without esophagitis: Secondary | ICD-10-CM | POA: Diagnosis not present

## 2020-03-05 DIAGNOSIS — R195 Other fecal abnormalities: Secondary | ICD-10-CM | POA: Diagnosis not present

## 2020-03-05 DIAGNOSIS — K5903 Drug induced constipation: Secondary | ICD-10-CM | POA: Diagnosis not present

## 2020-03-05 DIAGNOSIS — M546 Pain in thoracic spine: Secondary | ICD-10-CM
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
## 2020-03-05 NOTE — Patient Instructions (Signed)
Your health issues we discussed today were:   Constipation: 1. As discussed, start taking Colace 100 mg over-the-counter stool softener once a day or once every other day to try to prevent constipation 2. Call us if you have any worsening or severe symptoms  Dark stools: 1. I think your dark stools are likely because of Pepto-Bismol 2. Let us know if you have any symptoms of blood loss such as weakness, fatigue, shortness of breath, chest pain, dizziness, lightheadedness, passing out, nearly passing out  Abdominal pain: 1. We will proceed with upper endoscopy as previously recommended 2. Further recommendations will follow  Overall I recommend:  1. Continue your other current medications 2. Return for follow-up in 3 months 3. Call us if you have any questions or concerns.   ---------------------------------------------------------------  I am glad you have gotten your COVID-19 vaccination!  Even though you are fully vaccinated you should continue to follow CDC and state/local guidelines.  ---------------------------------------------------------------   At [facility] we value your feedback. You may receive a survey about your visit today. Please share your experience as we strive to create trusting relationships with our patients to provide genuine, compassionate, quality care.  We appreciate your understanding and patience as we review any laboratory studies, imaging, and other diagnostic tests that are ordered as we care for you. Our office policy is 5 business days for review of these results, and any emergent or urgent results are addressed in a timely manner for your best interest. If you do not hear from our office in 1 week, please contact us.   We also encourage the use of MyChart, which contains your medical information for your review as well. If you are not enrolled in this feature, an access code is on this after visit summary for your convenience. Thank  you for allowing [place] to be involved in your care.  It was great to see you today!  I hope you have a great Summer!!

## 2020-03-06 ENCOUNTER — Telehealth: Payer: Self-pay | Admitting: *Deleted

## 2020-03-06 ENCOUNTER — Encounter: Payer: Self-pay | Admitting: Internal Medicine

## 2020-03-06 NOTE — Telephone Encounter (Signed)
Called patient and scheduled EGD with propofol with RMR per encounter form appt scheduled for 8/12 at 7:30am. Patient aware also will need covid test prior. Advised will mail appt with instructions. Confirmed mailing address. Orders entered.

## 2020-03-07 ENCOUNTER — Telehealth: Payer: Self-pay | Admitting: *Deleted

## 2020-03-07 NOTE — Telephone Encounter (Signed)
PA for EGD pending clinical review. Clinicals faxed. Ref# [id number]

## 2020-03-15 NOTE — Telephone Encounter (Signed)
PA approved. Auth# [id number] dates 04/09/2020-05/11/2020

## 2020-03-18 NOTE — Assessment & Plan Note (Signed)
Chronic constipation, likely due to opioids.  Previously failed Movantik and Symproic.  Generally doing well currently with some minimal breakthrough.  Recommended addition of Colace daily to every other day to see if this helps his stools improve a bit.  Follow-up in 3 months.

## 2020-03-18 NOTE — Assessment & Plan Note (Signed)
The patient notes dark stools, but admits he takes Pepto-Bismol intermittently.  His most recent hemoglobin stable at 13.  Dark stools likely due to Pepto-Bismol.  However, we will proceed with endoscopy as previously recommended, as per below.  Monitor for any worsening or any symptoms of significant anemia and notify us immediately.  Otherwise, follow-up in 3 months.
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
## 2020-03-25 ENCOUNTER — Ambulatory Visit (HOSPITAL_COMMUNITY): Admission: RE | Admit: 2020-03-25 | Payer: Medicare HMO | Source: Ambulatory Visit

## 2020-03-26 ENCOUNTER — Ambulatory Visit (HOSPITAL_COMMUNITY): Payer: Medicare HMO

## 2020-03-28 ENCOUNTER — Encounter (HOSPITAL_COMMUNITY): Payer: Self-pay

## 2020-03-28 ENCOUNTER — Encounter (HOSPITAL_COMMUNITY)
Admission: RE | Admit: 2020-03-28 | Discharge: 2020-03-28 | Disposition: A | Discharge status: Home / Self Care | Payer: Medicare HMO | Source: Ambulatory Visit | Attending: Internal Medicine | Admitting: Internal Medicine

## 2020-03-28 ENCOUNTER — Other Ambulatory Visit: Payer: Self-pay

## 2020-03-28 DIAGNOSIS — T63441A Toxic effect of venom of bees, accidental (unintentional), initial encounter: Secondary | ICD-10-CM | POA: Diagnosis not present

## 2020-03-28 DIAGNOSIS — R0600 Dyspnea, unspecified: Secondary | ICD-10-CM | POA: Insufficient documentation

## 2020-03-28 DIAGNOSIS — R1310 Dysphagia, unspecified: Secondary | ICD-10-CM | POA: Diagnosis present

## 2020-03-28 DIAGNOSIS — F17210 Nicotine dependence, cigarettes, uncomplicated: Secondary | ICD-10-CM | POA: Diagnosis not present

## 2020-03-28 DIAGNOSIS — Z79899 Other long term (current) drug therapy: Secondary | ICD-10-CM | POA: Insufficient documentation

## 2020-03-28 DIAGNOSIS — I10 Essential (primary) hypertension: Secondary | ICD-10-CM | POA: Diagnosis not present

## 2020-03-28 DIAGNOSIS — F17220 Nicotine dependence, chewing tobacco, uncomplicated: Secondary | ICD-10-CM | POA: Insufficient documentation

## 2020-03-28 DIAGNOSIS — E119 Type 2 diabetes mellitus without complications: Secondary | ICD-10-CM | POA: Insufficient documentation
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
## 2020-03-29 ENCOUNTER — Ambulatory Visit: Payer: Medicare HMO | Admitting: Orthopedic Surgery

## 2020-03-29 ENCOUNTER — Emergency Department (HOSPITAL_COMMUNITY)
Admission: EM | Admit: 2020-03-29 | Discharge: 2020-03-29 | Disposition: A | Discharge status: Home / Self Care | Payer: Medicare HMO | Attending: Emergency Medicine | Admitting: Emergency Medicine

## 2020-03-29 DIAGNOSIS — T63441A Toxic effect of venom of bees, accidental (unintentional), initial encounter: Secondary | ICD-10-CM

## 2020-03-29 LAB — CBG MONITORING, ED: Glucose-Capillary: 117 mg/dL — ABNORMAL HIGH (ref 70–99)

## 2020-03-29 MED ORDER — PREDNISONE 50 MG PO TABS
50.0000 mg | ORAL_TABLET | Freq: Every day | ORAL | 0 refills | Status: DC
Start: 2020-03-29 — End: 2020-07-03

## 2020-03-29 MED ORDER — DIPHENHYDRAMINE HCL 50 MG/ML IJ SOLN
25.0000 mg | Freq: Once | INTRAMUSCULAR | Status: AC
  Administered 2020-03-29: 25 mg via INTRAVENOUS
  Filled 2020-03-29: qty 1

## 2020-03-29 MED ORDER — FAMOTIDINE IN NACL 20-0.9 MG/50ML-% IV SOLN
20.0000 mg | Freq: Once | INTRAVENOUS | Status: AC
  Administered 2020-03-29: 20 mg via INTRAVENOUS
  Filled 2020-03-29: qty 50

## 2020-03-29 MED ORDER — METHYLPREDNISOLONE SODIUM SUCC 125 MG IJ SOLR
125.0000 mg | Freq: Once | INTRAMUSCULAR | Status: AC
  Administered 2020-03-29: 125 mg via INTRAVENOUS
  Filled 2020-03-29: qty 2

## 2020-03-29 MED ORDER — EPINEPHRINE 0.3 MG/0.3ML IJ SOAJ
0.3000 mg | Freq: Once | INTRAMUSCULAR | Status: AC
  Administered 2020-03-29: 0.3 mg via INTRAMUSCULAR
  Filled 2020-03-29: qty 0.3

## 2020-03-29 MED ORDER — HYDROCODONE-ACETAMINOPHEN 5-325 MG PO TABS
2.0000 | ORAL_TABLET | Freq: Once | ORAL | Status: AC
  Administered 2020-03-29: 2 via ORAL
  Filled 2020-03-29: qty 2

## 2020-03-29 MED ORDER — MORPHINE SULFATE (PF) 4 MG/ML IV SOLN: 4.0000 mg | Freq: Once | INTRAVENOUS | Status: DC

## 2020-03-29 MED ORDER — EPINEPHRINE 0.3 MG/0.3ML IJ SOAJ
0.3000 mg | INTRAMUSCULAR | 0 refills | Status: DC | PRN
Start: 2020-03-29 — End: 2020-07-09

## 2020-03-29 MED ORDER — HYDROMORPHONE HCL 1 MG/ML IJ SOLN
0.5000 mg | Freq: Once | INTRAMUSCULAR | Status: AC
  Administered 2020-03-29: 0.5 mg via INTRAVENOUS
  Filled 2020-03-29: qty 1

## 2020-03-29 MED ORDER — ALUM & MAG HYDROXIDE-SIMETH 200-200-20 MG/5ML PO SUSP
30.0000 mL | Freq: Once | ORAL | Status: AC
  Administered 2020-03-29: 30 mL via ORAL
  Filled 2020-03-29: qty 30

## 2020-03-29 MED ORDER — LIDOCAINE VISCOUS HCL 2 % MT SOLN
15.0000 mL | Freq: Once | OROMUCOSAL | Status: AC
  Administered 2020-03-29: 15 mL via ORAL
  Filled 2020-03-29: qty 15

## 2020-03-29 NOTE — ED Notes (Signed)
Pt alerted rn to room. Pt stated that since pt was triaged he feels that his swelling has gotten worse and this voice has became hoarse and harder to swallow.  EDP notified. EDP to come to room

## 2020-03-29 NOTE — Discharge Instructions (Signed)
Take diphenhydramine as needed.  If you get stung again, use the Epi Pen, then come immediately to the Emergency Department.
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
## 2020-03-29 NOTE — ED Notes (Signed)
Ekg done due to pt being in the floor and crying with epigastric pain.

## 2020-04-02 ENCOUNTER — Other Ambulatory Visit (HOSPITAL_COMMUNITY): Payer: Medicare HMO

## 2020-04-03 ENCOUNTER — Other Ambulatory Visit: Payer: Self-pay | Admitting: Gastroenterology

## 2020-04-08 ENCOUNTER — Ambulatory Visit: Payer: Medicare HMO | Admitting: Gastroenterology

## 2020-04-08 ENCOUNTER — Ambulatory Visit (HOSPITAL_COMMUNITY)
Admission: RE | Admit: 2020-04-08 | Discharge: 2020-04-08 | Disposition: A | Discharge status: Home / Self Care | Payer: Medicare HMO | Source: Ambulatory Visit | Attending: Orthopedic Surgery | Admitting: Orthopedic Surgery

## 2020-04-08 ENCOUNTER — Other Ambulatory Visit: Payer: Self-pay

## 2020-04-08 DIAGNOSIS — R2232 Localized swelling, mass and lump, left upper limb: Secondary | ICD-10-CM | POA: Diagnosis present

## 2020-04-08 MED ORDER — GADOBUTROL 1 MMOL/ML IV SOLN
7.0000 mL | Freq: Once | INTRAVENOUS | Status: AC | PRN
  Administered 2020-04-08: 7 mL via INTRAVENOUS

## 2020-04-09 ENCOUNTER — Other Ambulatory Visit (HOSPITAL_COMMUNITY)
Admission: RE | Admit: 2020-04-09 | Discharge: 2020-04-09 | Disposition: A | Discharge status: Home / Self Care | Payer: Medicare HMO | Source: Ambulatory Visit | Attending: Internal Medicine | Admitting: Internal Medicine

## 2020-04-09 DIAGNOSIS — Z01812 Encounter for preprocedural laboratory examination: Secondary | ICD-10-CM | POA: Diagnosis present

## 2020-04-09 DIAGNOSIS — Z20822 Contact with and (suspected) exposure to covid-19: Secondary | ICD-10-CM | POA: Diagnosis not present

## 2020-04-10 LAB — SARS CORONAVIRUS 2 (TAT 6-24 HRS): SARS Coronavirus 2: NEGATIVE

## 2020-04-11 ENCOUNTER — Ambulatory Visit (HOSPITAL_COMMUNITY): Payer: Medicare HMO | Admitting: Anesthesiology

## 2020-04-11 ENCOUNTER — Encounter (HOSPITAL_COMMUNITY): Payer: Self-pay | Admitting: Internal Medicine

## 2020-04-11 ENCOUNTER — Telehealth: Payer: Self-pay

## 2020-04-11 ENCOUNTER — Other Ambulatory Visit: Payer: Self-pay

## 2020-04-11 ENCOUNTER — Ambulatory Visit (HOSPITAL_COMMUNITY)
Admission: RE | Admit: 2020-04-11 | Discharge: 2020-04-11 | Disposition: A | Discharge status: Home / Self Care | Payer: Medicare HMO | Attending: Internal Medicine | Admitting: Internal Medicine

## 2020-04-11 ENCOUNTER — Encounter (HOSPITAL_COMMUNITY)
Admission: RE | Disposition: A | Discharge status: Home / Self Care | Payer: Self-pay | Source: Home / Self Care | Attending: Internal Medicine

## 2020-04-11 DIAGNOSIS — Z6828 Body mass index (BMI) 28.0-28.9, adult: Secondary | ICD-10-CM | POA: Insufficient documentation

## 2020-04-11 DIAGNOSIS — I10 Essential (primary) hypertension: Secondary | ICD-10-CM | POA: Insufficient documentation

## 2020-04-11 DIAGNOSIS — K921 Melena: Secondary | ICD-10-CM | POA: Diagnosis not present

## 2020-04-11 DIAGNOSIS — R1310 Dysphagia, unspecified: Secondary | ICD-10-CM | POA: Insufficient documentation

## 2020-04-11 DIAGNOSIS — F419 Anxiety disorder, unspecified: Secondary | ICD-10-CM | POA: Insufficient documentation

## 2020-04-11 DIAGNOSIS — E669 Obesity, unspecified: Secondary | ICD-10-CM | POA: Diagnosis not present

## 2020-04-11 DIAGNOSIS — E785 Hyperlipidemia, unspecified: Secondary | ICD-10-CM | POA: Diagnosis not present

## 2020-04-11 DIAGNOSIS — K219 Gastro-esophageal reflux disease without esophagitis: Secondary | ICD-10-CM | POA: Insufficient documentation

## 2020-04-11 DIAGNOSIS — Z7952 Long term (current) use of systemic steroids: Secondary | ICD-10-CM | POA: Insufficient documentation

## 2020-04-11 DIAGNOSIS — F329 Major depressive disorder, single episode, unspecified: Secondary | ICD-10-CM | POA: Diagnosis not present

## 2020-04-11 DIAGNOSIS — Z79899 Other long term (current) drug therapy: Secondary | ICD-10-CM | POA: Diagnosis not present

## 2020-04-11 DIAGNOSIS — F17210 Nicotine dependence, cigarettes, uncomplicated: Secondary | ICD-10-CM | POA: Diagnosis not present

## 2020-04-11 DIAGNOSIS — E119 Type 2 diabetes mellitus without complications: Secondary | ICD-10-CM | POA: Diagnosis not present

## 2020-04-11 DIAGNOSIS — G4730 Sleep apnea, unspecified: Secondary | ICD-10-CM | POA: Insufficient documentation

## 2020-04-11 DIAGNOSIS — Z538 Procedure and treatment not carried out for other reasons: Secondary | ICD-10-CM | POA: Insufficient documentation

## 2020-04-11 DIAGNOSIS — Z7982 Long term (current) use of aspirin: Secondary | ICD-10-CM | POA: Insufficient documentation

## 2020-04-11 HISTORY — PX: ESOPHAGOGASTRODUODENOSCOPY (EGD) WITH PROPOFOL: SHX5813

## 2020-04-11 LAB — GLUCOSE, CAPILLARY: Glucose-Capillary: 100 mg/dL — ABNORMAL HIGH (ref 70–99)

## 2020-04-11 SURGERY — ESOPHAGOGASTRODUODENOSCOPY (EGD) WITH PROPOFOL
Anesthesia: General

## 2020-04-11 MED ORDER — STERILE WATER FOR IRRIGATION IR SOLN
Status: DC | PRN
  Administered 2020-04-11: 1.5 mL

## 2020-04-11 MED ORDER — CHLORHEXIDINE GLUCONATE CLOTH 2 % EX PADS: 6.0000 | MEDICATED_PAD | Freq: Once | CUTANEOUS | Status: DC

## 2020-04-11 MED ORDER — LACTATED RINGERS IV SOLN: INTRAVENOUS | Status: DC

## 2020-04-11 MED ORDER — LIDOCAINE VISCOUS HCL 2 % MT SOLN: 15.0000 mL | Freq: Once | OROMUCOSAL | Status: DC

## 2020-04-11 MED ORDER — PROPOFOL 10 MG/ML IV BOLUS
INTRAVENOUS | Status: DC | PRN
  Administered 2020-04-11: 50 mg via INTRAVENOUS
  Administered 2020-04-11: 20 mg via INTRAVENOUS

## 2020-04-11 MED ORDER — GLYCOPYRROLATE 0.2 MG/ML IJ SOLN
0.2000 mg | Freq: Once | INTRAMUSCULAR | Status: AC
  Administered 2020-04-11: 0.2 mg via INTRAVENOUS

## 2020-04-11 MED ORDER — GLYCOPYRROLATE 0.2 MG/ML IJ SOLN
INTRAMUSCULAR | Status: AC
  Filled 2020-04-11: qty 1

## 2020-04-11 MED ORDER — LIDOCAINE VISCOUS HCL 2 % MT SOLN
OROMUCOSAL | Status: AC
  Administered 2020-04-11: 15 mL
  Filled 2020-04-11: qty 15

## 2020-04-11 NOTE — Discharge Instructions (Signed)
EGD Discharge instructions Please read the instructions outlined below and refer to this sheet in the next few weeks. These discharge instructions provide you with general information on caring for yourself after you leave the hospital. Your doctor may also give you specific instructions. While your treatment has been planned according to the most current medical practices available, unavoidable complications occasionally occur. If you have any problems or questions after discharge, please call your doctor. ACTIVITY  You may resume your regular activity but move at a slower pace for the next 24 hours.   Take frequent rest periods for the next 24 hours.   Walking will help expel (get rid of) the air and reduce the bloated feeling in your abdomen.   No driving for 24 hours (because of the anesthesia (medicine) used during the test).   You may shower.   Do not sign any important legal documents or operate any machinery for 24 hours (because of the anesthesia used during the test).  NUTRITION  Drink plenty of fluids.   You may resume your normal diet.   Begin with a light meal and progress to your normal diet.   Avoid alcoholic beverages for 24 hours or as instructed by your caregiver.  MEDICATIONS  You may resume your normal medications unless your caregiver tells you otherwise.  WHAT YOU CAN EXPECT TODAY  You may experience abdominal discomfort such as a feeling of fullness or gas pains.  FOLLOW-UP  Your doctor will discuss the results of your test with you.  SEEK IMMEDIATE MEDICAL ATTENTION IF ANY OF THE FOLLOWING OCCUR:  Excessive nausea (feeling sick to your stomach) and/or vomiting.   Severe abdominal pain and distention (swelling).   Trouble swallowing.   Temperature over 101 F (37.8 C).   Rectal bleeding or vomiting of blood.   Stomach still had food in it.  Could not see to complete examination  Reschedule office visit with [place] in the near future  Discussed  with patient's mother at [phone]

## 2020-04-11 NOTE — Anesthesia Procedure Notes (Signed)
Date/Time: 04/11/2020 7:29 AM Performed by: Vista Deck, CRNA Pre-anesthesia Checklist: Patient identified, Emergency Drugs available, Suction available, Timeout performed and Patient being monitored Patient Re-evaluated:Patient Re-evaluated prior to induction Oxygen Delivery Method: Nasal Cannula

## 2020-04-11 NOTE — Transfer of Care (Addendum)
Immediate Anesthesia Transfer of Care Note  Patient: Brian [name]  Procedure(s) Performed: ESOPHAGOGASTRODUODENOSCOPY (EGD) WITH PROPOFOL (N/A )  Patient Location: PACU  Anesthesia Type:General  Level of Consciousness: awake and patient cooperative  Airway & Oxygen Therapy: Patient Spontanous Breathing  Post-op Assessment: Report given to RN and Post -op Vital signs reviewed and stable  Post vital signs: Reviewed and stable  Last Vitals:  Vitals Value Taken Time  BP    Temp 36.1   Pulse    Resp    SpO2      Last Pain:  Vitals:   04/11/20 0731  TempSrc:   PainSc: 0-No pain      Patients Stated Pain Goal: 10 (03/52/48 1859)  Complications: No complications documented.

## 2020-04-11 NOTE — Anesthesia Postprocedure Evaluation (Signed)
Anesthesia Post Note  Patient: Brian [name]  Procedure(s) Performed: ESOPHAGOGASTRODUODENOSCOPY (EGD) WITH PROPOFOL (N/A )  Patient location during evaluation: Endoscopy Anesthesia Type: General Level of consciousness: awake and alert and patient cooperative Pain management: satisfactory to patient Vital Signs Assessment: post-procedure vital signs reviewed and stable Respiratory status: spontaneous breathing Cardiovascular status: stable Postop Assessment: no apparent nausea or vomiting Anesthetic complications: no   No complications documented.   Last Vitals:  Vitals:   04/11/20 0805 04/11/20 0807  BP: (!) 90/54 (!) 101/56  Pulse:  (!) 50  Resp:    Temp:  (!) 36.1 C  SpO2: 99% 97%    Last Pain:  Vitals:   04/11/20 0807  TempSrc: Oral  PainSc: 0-No pain                 Kalynne Womac
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
## 2020-04-11 NOTE — Op Note (Signed)
[facility] Patient Name: Brian [name] Procedure Date: 04/11/2020 7:04 AM MRN: [id number] Date of Birth: 04-12-1956 Attending MD: Norvel Richards , MD CSN: [id number] Age: 64 Admit Type: Outpatient Procedure:                Upper GI endoscopy Indications:              Dysphagia, Melena Providers:                Norvel Richards, MD, Caprice Kluver, Rosina Lowenstein,                            RN, Aram Candela Referring MD:              Medicines:                Propofol per Anesthesia Complications:            No immediate complications. Estimated Blood Loss:     Estimated blood loss: none. Procedure:                Pre-Anesthesia Assessment:                           - Prior to the procedure, a History and Physical                            was performed, and patient medications and                            allergies were reviewed. The patient's tolerance of                            previous anesthesia was also reviewed. The risks                            and benefits of the procedure and the sedation                            options and risks were discussed with the patient.                            All questions were answered, and informed consent                            was obtained. Prior Anticoagulants: The patient has                            taken no previous anticoagulant or antiplatelet                            agents. ASA Grade Assessment: III - A patient with                            severe systemic disease. After reviewing the risks  and benefits, the patient was deemed in                            satisfactory condition to undergo the procedure.                           After obtaining informed consent, the endoscope was                            passed under direct vision. Throughout the                            procedure, the patient's blood pressure, pulse, and                            oxygen saturations were  monitored continuously. The                            GIF-H190 ([id number]) scope was introduced through the                            mouth, with the intention of advancing to the                            duodenum. The scope was advanced to the second part                            of the duodenum before the procedure was aborted.                            Medications were given. Scope In: 7:37:00 AM Scope Out: 7:41:33 AM Total Procedure Duration: 0 hours 4 minutes 33 seconds  Findings:      The examined esophagus was normal. Retained gastric contents.       Compromised exam. Could not gain adequate visualization of all the       gastric mucosa - attempted lavage and suctioned out. Scope kept getting       clogged. Patent pylorus. Normal-appearing first and second portion of       the duodenum. Seizure terminated. Impression:               - Normal esophagus.                           -Retained gastric contents. Incomplete examination.                            Patient's last intake of solid food was spaghetti                            at 1700 yesterday.. Moderate Sedation:      Moderate (conscious) sedation was personally administered by an       anesthesia professional. The following parameters were monitored: oxygen       saturation, heart rate, blood pressure, respiratory rate, EKG, adequacy       of pulmonary  ventilation, and response to care. Recommendation:           - Patient has a contact number available for                            emergencies. The signs and symptoms of potential                            delayed complications were discussed with the                            patient. Return to normal activities tomorrow.                            Written discharge instructions were provided to the                            patient.                           - Advance diet as tolerated. Return to the office                            as scheduled. Procedure  Code(s):        --- Professional ---                           [phone], 52, Esophagogastroduodenoscopy, flexible,                            transoral; diagnostic, including collection of                            specimen(s) by brushing or washing, when performed                            (separate procedure) Diagnosis Code(s):        --- Professional ---                           R13.10, Dysphagia, unspecified                           K92.1, Melena (includes Hematochezia) CPT copyright 2019 American Medical Association. All rights reserved. The codes documented in this report are preliminary and upon coder review may  be revised to meet current compliance requirements. [name]. [name], MD Norvel Richards, MD 04/11/2020 7:51:00 AM This report has been signed electronically. Number of Addenda: 0

## 2020-04-11 NOTE — Anesthesia Preprocedure Evaluation (Signed)
Anesthesia Evaluation  Patient identified by MRN, date of birth, ID band Patient awake    Reviewed: Allergy & Precautions, H&P , NPO status , Patient's Chart, lab work & pertinent test results, reviewed documented beta blocker date and time   Airway Mallampati: II  TM Distance: >3 FB Neck ROM: full    Dental no notable dental hx.    Pulmonary shortness of breath, Current Smoker,    Pulmonary exam normal breath sounds clear to auscultation       Cardiovascular Exercise Tolerance: Good hypertension, negative cardio ROS   Rhythm:regular Rate:Normal     Neuro/Psych  Headaches, Seizures -, Well Controlled,  PSYCHIATRIC DISORDERS Anxiety Depression    GI/Hepatic Neg liver ROS, GERD  Medicated,  Endo/Other  negative endocrine ROSdiabetes, Type 2  Renal/GU negative Renal ROS  negative genitourinary   Musculoskeletal   Abdominal   Peds  Hematology negative hematology ROS (+)   Anesthesia Other Findings   Reproductive/Obstetrics negative OB ROS                             Anesthesia Physical Anesthesia Plan  ASA: II  Anesthesia Plan: General   Post-op Pain Management:    Induction:   PONV Risk Score and Plan: Propofol infusion  Airway Management Planned:   Additional Equipment:   Intra-op Plan:   Post-operative Plan:   Informed Consent: I have reviewed the patients History and Physical, chart, labs and discussed the procedure including the risks, benefits and alternatives for the proposed anesthesia with the patient or authorized representative who has indicated his/her understanding and acceptance.     Dental Advisory Given  Plan Discussed with: CRNA  Anesthesia Plan Comments:         Anesthesia Quick Evaluation

## 2020-04-11 NOTE — Telephone Encounter (Signed)
Per Dr. Gala Romney, pt had retained gastric contents during EGD today. Last ate spaghetti at 1700 yesterday.  EG advised to work on rescheduling with clear liquids the day before for his EGD. (EGD w/Prop w/Dr. Gala Romney ASA 2)   Tried to call pt, no answer, LMOVM for return call.

## 2020-04-12 NOTE — Telephone Encounter (Signed)
Tried to call pt, no answer, LMOVM for return call.  

## 2020-04-15 ENCOUNTER — Telehealth: Payer: Self-pay | Admitting: Internal Medicine

## 2020-04-15 NOTE — Telephone Encounter (Signed)
I called patient back this evening in response to his 2 phone calls to the office today.  He sounded irate guarding his endoscopy experience last week.  I have reviewed the procedure note, etc.  Patient's concern is the fact that he woke up for "5 seconds  choking on the tube and fighting [place] and we then put him back to sleep".  He states his stomach feels like a "Roto-Rooter".  States his abdomen and chest hurt really bad.  Patient states he wants some satisfaction.  Wants to know what I am going to do about the fact that he woke up for 5 seconds.  He states this happened once before as well.  States he is already made contact with patient experience.  He wants feedback from the team that treated him last week.   I reviewed the record including the procedure note.  This was an uneventful incomplete procedure due to large amount of food in his stomach.  The reasons for stopping the procedure were again reviewed with him this evening. I told him I do not understand why is having so much chest and abdominal discomfort.  I do not feel at all it came from an aborted diagnostic procedure.  There were no apparent complications. Most importantly, I told him that if he is having severe chest / abdominal pain he should present himself to the ED without delay.  As far as his concerns regarding his anesthesia go from last week, I told him I would forward his concerns to the staff that was in charge of his anesthesia. I told him I was most concerned about his bad experience and I would strive to get his questions answered as soon as possible.

## 2020-04-15 NOTE — Telephone Encounter (Signed)
Pt called asking to speak with Dr Gala Romney. I told him Dr Gala Romney was at the hospital today and I could have his nurse call him. Pt said he wanted Dr Gala Romney and wanting to talk to him about waking up during his procedure. He wants Dr Gala Romney only. [phone]

## 2020-04-15 NOTE — Telephone Encounter (Signed)
Letter mailed

## 2020-04-15 NOTE — Telephone Encounter (Signed)
Pt called the office again asking when was Dr Gala Romney going to call him back. I told him I had already put in a phone note from earlier and Dr Gala Romney was at the hospital doing procedures. Pt is angry and demanding that Dr Gala Romney call him back today. He said it was about his procedure he had done on Thursday. [phone]

## 2020-04-16 ENCOUNTER — Encounter (HOSPITAL_COMMUNITY): Payer: Self-pay | Admitting: Internal Medicine

## 2020-04-16 NOTE — Telephone Encounter (Signed)
Noted  

## 2020-04-16 NOTE — Telephone Encounter (Signed)
Tried to call pt, no answer, LMOVM for return call.  

## 2020-04-16 NOTE — Telephone Encounter (Signed)
I spoke with the patient please schedule the procedure for first available with Dr. Gala Romney and call with date and time

## 2020-04-17 ENCOUNTER — Ambulatory Visit: Payer: Medicare HMO | Admitting: Orthopedic Surgery

## 2020-04-17 NOTE — Telephone Encounter (Signed)
Pt called office, offered to schedule procedure 05/02/20 at 2:00pm. Pt wants morning procedure time only. Dr. Gala Romney doesn't have any openings for morning procedure in September. Advised him we will call back to schedule procedure when October schedule is available.

## 2020-04-19 NOTE — Telephone Encounter (Signed)
LMOVM for pt 

## 2020-04-22 NOTE — Telephone Encounter (Signed)
lmovm for pt. Letter mailed.

## 2020-04-24 NOTE — Telephone Encounter (Signed)
Noted  

## 2020-04-24 NOTE — Telephone Encounter (Signed)
Pt called in today and informed me that he is no longer coming to see [place] to have any future procedures.  He said that he is going somewhere else.  Informed him that I would let Dr. Gala Romney know.

## 2020-04-29 NOTE — Telephone Encounter (Signed)
Communication noted.  

## 2020-04-30 ENCOUNTER — Encounter: Payer: Self-pay | Admitting: General Practice

## 2020-04-30 NOTE — Telephone Encounter (Signed)
noted 

## 2020-04-30 NOTE — Telephone Encounter (Signed)
Discharge letter mailed  

## 2020-05-14 ENCOUNTER — Ambulatory Visit: Payer: Medicare HMO | Admitting: Gastroenterology

## 2020-05-16 ENCOUNTER — Telehealth: Payer: Self-pay

## 2020-05-16 ENCOUNTER — Ambulatory Visit: Payer: Medicare HMO | Admitting: Orthopedic Surgery

## 2020-05-16 NOTE — Telephone Encounter (Signed)
Pt walked in the office  05/16/2020 @ 12:12 PM, requesting a copy of his upper endoscopy report from 04/11/2020. Report was printed out and given to pt.

## 2020-06-05 ENCOUNTER — Ambulatory Visit: Payer: Medicare HMO | Admitting: Orthopedic Surgery

## 2020-06-05 ENCOUNTER — Encounter: Payer: Self-pay | Admitting: Orthopedic Surgery

## 2020-07-03 ENCOUNTER — Other Ambulatory Visit: Payer: Self-pay

## 2020-07-03 ENCOUNTER — Telehealth: Payer: Self-pay | Admitting: Orthopedic Surgery

## 2020-07-03 ENCOUNTER — Telehealth: Payer: Self-pay | Admitting: Radiology

## 2020-07-03 ENCOUNTER — Ambulatory Visit: Payer: Medicare HMO | Admitting: Orthopedic Surgery

## 2020-07-03 VITALS — BP 138/73 | HR 70 | Ht 72.0 in | Wt 205.0 lb

## 2020-07-03 DIAGNOSIS — M67442 Ganglion, left hand: Secondary | ICD-10-CM | POA: Diagnosis not present

## 2020-07-03 NOTE — Telephone Encounter (Signed)
No authorization required for the mass removal per [employer]/ [employer] [phone] is CPT code.

## 2020-07-03 NOTE — Telephone Encounter (Signed)
Brian [name] called back and stated that the 9th would work well for him to proceed with surgery.  He asks that you call him back once this is scheduled and let him know that surgery can be done that day and of his pre op appointment and anything else that he needs to do.  Thanks

## 2020-07-03 NOTE — Addendum Note (Signed)
Addended byCandice Camp on: 07/03/2020 02:53 PM   Modules accepted: Orders, SmartSet

## 2020-07-03 NOTE — H&P (View-Only) (Signed)
Chief Complaint  Patient presents with  . Hand Problem    left finger/ mass for years review MRI    MRI F/U   64 year old male has a mass on his left Covelli finger it looks like a ganglion cyst clinically.  Has had it for 30 years he says is causing pain  We sent him for MRI   I SEE A MASS C/W GANGLION CYST LEFT Se[place]o FINGER   Report  CLINICAL DATA:  Soft tissue mass on the left hand for 30 years. Recent onset of pain.  EXAM: MRI OF THE LEFT HAND WITHOUT AND WITH CONTRAST In the volar soft tissues of the Basinski finger at the level of the middle phalanx, there is a well-circumscribed T2 hyperintense lesion measuring 1 cm AP x 1.8 cm transverse by 1.8 cm craniocaudal. The lesion has a single septation within it. It is nearly isointense to muscle on T1 weighted imaging and does not enhance. The lesion is contiguous with the flexor tendon but the tendon itself appears normal.   IMPRESSION: Lesion in the volar soft tissues of the Flaten finger is contiguous with the flexor tendon and has an appearance most consistent with a benign cyst such as a ganglion.   Findings most consistent with remote injury to the distal left index finger.   He wishes to proceed with surgical excision he understands his risks of neurovascular compromise but he wishes to proceed because of the pain  Patient will be scheduled for excision of ganglion cyst left Batz/middle finger

## 2020-07-03 NOTE — Telephone Encounter (Signed)
I called him to advise put in surgical orders told him to expect call from [facility] for preop and for the instructions

## 2020-07-03 NOTE — Progress Notes (Signed)
Chief Complaint  Patient presents with  . Hand Problem    left finger/ mass for years review MRI    MRI F/U   64 year old male has a mass on his left Dorow finger it looks like a ganglion cyst clinically.  Has had it for 30 years he says is causing pain  We sent him for MRI   I SEE A MASS C/W GANGLION CYST LEFT [place] FINGER   Report  CLINICAL DATA:  Soft tissue mass on the left hand for 30 years. Recent onset of pain.  EXAM: MRI OF THE LEFT HAND WITHOUT AND WITH CONTRAST In the volar soft tissues of the Posch finger at the level of the middle phalanx, there is a well-circumscribed T2 hyperintense lesion measuring 1 cm AP x 1.8 cm transverse by 1.8 cm craniocaudal. The lesion has a single septation within it. It is nearly isointense to muscle on T1 weighted imaging and does not enhance. The lesion is contiguous with the flexor tendon but the tendon itself appears normal.   IMPRESSION: Lesion in the volar soft tissues of the Spang finger is contiguous with the flexor tendon and has an appearance most consistent with a benign cyst such as a ganglion.   Findings most consistent with remote injury to the distal left index finger.   He wishes to proceed with surgical excision he understands his risks of neurovascular compromise but he wishes to proceed because of the pain  Patient will be scheduled for excision of ganglion cyst left Brazell/middle finger

## 2020-07-05 NOTE — Patient Instructions (Signed)
Brian [name]  07/05/2020     @PREFPERIOPPHARMACY @   Your procedure is scheduled on  07/09/2020.  Report to [facility] at  [email or web address].  Call this number if you have problems the morning of surgery:  [phone]   Remember:  Do not eat or drink after midnight.                         Take these medicines the morning of surgery with A SIP OF WATER  Amlodipine, baclofen, buspar, celexa, valium(if needed), benadryl, pepcid, gabapentin, hydrocodone(if needed), reglan, zofran(if needed), protonix, phenergan(if needed), topamax.    Do not wear jewelry, make-up or nail polish.  Do not wear lotions, powders, or perfumes, or deodorant. Please brush your teeth.  Do not shave 48 hours prior to surgery.  Men may shave face and neck.  Do not bring valuables to the hospital.  [facility] is not responsible for any belongings or valuables.  Contacts, dentures or bridgework may not be worn into surgery.  Leave your suitcase in the car.  After surgery it may be brought to your room.  For patients admitted to the hospital, discharge time will be determined by your treatment team.  Patients discharged the day of surgery will not be allowed to drive home.   Name and phone number of your driver:   family Special instructions:   DO NOT smoke the morning of your procedure.  Please read over the following fact sheets that you were given. Anesthesia Post-op Instructions and Care and Recovery After Surgery       Incision Care, Adult An incision is a cut that a doctor makes in your skin for surgery (for a procedure). Most times, these cuts are closed after surgery. Your cut from surgery may be closed with stitches (sutures), staples, skin glue, or skin tape (adhesive strips). You may need to return to your doctor to have stitches or staples taken out. This may happen many days or many weeks after your surgery. The cut needs to be well cared for so it does not get infected. How to care  for your cut Cut care   Follow instructions from your doctor about how to take care of your cut. Make sure you: ? Wash your hands with soap and water before you change your bandage (dressing). If you cannot use soap and water, use hand sanitizer. ? Change your bandage as told by your doctor. ? Leave stitches, skin glue, or skin tape in place. They may need to stay in place for 2 weeks or longer. If tape strips get loose and curl up, you may trim the loose edges. Do not remove tape strips completely unless your doctor says it is okay.  Check your cut area every day for signs of infection. Check for: ? More redness, swelling, or pain. ? More fluid or blood. ? Warmth. ? Pus or a bad smell.  Ask your doctor how to clean the cut. This may include: ? Using mild soap and water. ? Using a clean towel to pat the cut dry after you clean it. ? Putting a cream or ointment on the cut. Do this only as told by your doctor. ? Covering the cut with a clean bandage.  Ask your doctor when you can leave the cut uncovered.  Do not take baths, swim, or use a hot tub until your doctor says it is  okay. Ask your doctor if you can take showers. You may only be allowed to take sponge baths for bathing. Medicines  If you were prescribed an antibiotic medicine, cream, or ointment, take the antibiotic or put it on the cut as told by your doctor. Do not stop taking or putting on the antibiotic even if your condition gets better.  Take over-the-counter and prescription medicines only as told by your doctor. General instructions  Limit movement around your cut. This helps healing. ? Avoid straining, lifting, or exercise for the first month, or for as [place] as told by your doctor. ? Follow instructions from your doctor about going back to your normal activities. ? Ask your doctor what activities are safe.  Protect your cut from the sun when you are outside for the first 6 months, or for as H[place]man as told by your  doctor. Put on sunscreen around the scar or cover up the scar.  Keep all follow-up visits as told by your doctor. This is important. Contact a doctor if:  Your have more redness, swelling, or pain around the cut.  You have more fluid or blood coming from the cut.  Your cut feels warm to the touch.  You have pus or a bad smell coming from the cut.  You have a fever or shaking chills.  You feel sick to your stomach (nauseous) or you throw up (vomit).  You are dizzy.  Your stitches or staples come undone. Get help right away if:  You have a red streak coming from your cut.  Your cut bleeds through the bandage and the bleeding does not stop with gentle pressure.  The edges of your cut open up and separate.  You have very bad (severe) pain.  You have a rash.  You are confused.  You pass out (faint).  You have trouble breathing and you have a fast heartbeat. This information is not intended to replace advice given to you by your health care provider. Make sure you discuss any questions you have with your health care provider. Document Revised: 01/04/2017 Document Reviewed: 04/24/2016 Elsevier Patient Education  2020 [place].  Ganglion Cyst  A ganglion cyst is a non-cancerous, fluid-filled lump that occurs near a joint or tendon. The cyst grows out of a joint or the lining of a tendon. Ganglion cysts most often develop in the hand or wrist, but they can also develop in the shoulder, elbow, hip, knee, ankle, or foot. Ganglion cysts are ball-shaped or egg-shaped. Their size can range from the size of a pea to larger than a grape. Increased activity may cause the cyst to get bigger because more fluid starts to build up. What are the causes? The exact cause of this condition is not known, but it may be related to:  Inflammation or irritation around the joint.  An injury.  Repetitive movements or overuse.  Arthritis. What increases the risk? You are more likely to  develop this condition if:  You are a woman.  You are 47-53 years old. What are the signs or symptoms? The main symptom of this condition is a lump. It most often appears on the hand or wrist. In many cases, there are no other symptoms, but a cyst can sometimes cause:  Tingling.  Pain.  Numbness.  Muscle weakness.  Weak grip.  Less range of motion in a joint. How is this diagnosed? Ganglion cysts are usually diagnosed based on a physical exam. Your health care provider will feel the  lump and may shine a light next to it. If it is a ganglion cyst, the light will likely shine through it. Your health care provider may order an X-ray, ultrasound, or MRI to rule out other conditions. How is this treated? Ganglion cysts often go away on their own without treatment. If you have pain or other symptoms, treatment may be needed. Treatment is also needed if the ganglion cyst limits your movement or if it gets infected. Treatment may include:  Wearing a brace or splint on your wrist or finger.  Taking anti-inflammatory medicine.  Having fluid drained from the lump with a needle (aspiration).  Getting a steroid injected into the joint.  Having surgery to remove the ganglion cyst.  Placing a pad on your shoe or wearing shoes that will not rub against the cyst if it is on your foot. Follow these instructions at home:  Do not press on the ganglion cyst, poke it with a needle, or hit it.  Take over-the-counter and prescription medicines only as told by your health care provider.  If you have a brace or splint: ? Wear it as told by your health care provider. ? Remove it as told by your health care provider. Ask if you need to remove it when you take a shower or a bath.  Watch your ganglion cyst for any changes.  Keep all follow-up visits as told by your health care provider. This is important. Contact a health care provider if:  Your ganglion cyst becomes larger or more  painful.  You have pus coming from the lump.  You have weakness or numbness in the affected area.  You have a fever or chills. Get help right away if:  You have a fever and have any of these in the cyst area: ? Increased redness. ? Red streaks. ? Swelling. Summary  A ganglion cyst is a non-cancerous, fluid-filled lump that occurs near a joint or tendon.  Ganglion cysts most often develop in the hand or wrist, but they can also develop in the shoulder, elbow, hip, knee, ankle, or foot.  Ganglion cysts often go away on their own without treatment. This information is not intended to replace advice given to you by your health care provider. Make sure you discuss any questions you have with your health care provider. Document Revised: 07/30/2017 Document Reviewed: 04/16/2017 Elsevier Patient Education  [place] After These instructions provide you with information about caring for yourself after your procedure. Your health care provider may also give you more specific instructions. Your treatment has been planned according to current medical practices, but problems sometimes occur. Call your health care provider if you have any problems or questions after your procedure. What can I expect after the procedure? After your procedure, you may:  Feel sleepy for several hours.  Feel clumsy and have poor balance for several hours.  Feel forgetful about what happened after the procedure.  Have poor judgment for several hours.  Feel nauseous or vomit.  Have a sore throat if you had a breathing tube during the procedure. Follow these instructions at home: For at least 24 hours after the procedure:      Have a responsible adult stay with you. It is important to have someone help care for you until you are awake and alert.  Rest as needed.  Do not: ? Participate in activities in which you could fall or become injured. ? Drive. ? Use  heavy machinery. ?  Drink alcohol. ? Take sleeping pills or medicines that cause drowsiness. ? Make important decisions or sign legal documents. ? Take care of children on your own. Eating and drinking  Follow the diet that is recommended by your health care provider.  If you vomit, drink water, juice, or soup when you can drink without vomiting.  Make sure you have little or no nausea before eating solid foods. General instructions  Take over-the-counter and prescription medicines only as told by your health care provider.  If you have sleep apnea, surgery and certain medicines can increase your risk for breathing problems. Follow instructions from your health care provider about wearing your sleep device: ? Anytime you are sleeping, including during daytime naps. ? While taking prescription pain medicines, sleeping medicines, or medicines that make you drowsy.  If you smoke, do not smoke without supervision.  Keep all follow-up visits as told by your health care provider. This is important. Contact a health care provider if:  You keep feeling nauseous or you keep vomiting.  You feel light-headed.  You develop a rash.  You have a fever. Get help right away if:  You have trouble breathing. Summary  For several hours after your procedure, you may feel sleepy and have poor judgment.  Have a responsible adult stay with you for at least 24 hours or until you are awake and alert. This information is not intended to replace advice given to you by your health care provider. Make sure you discuss any questions you have with your health care provider. Document Revised: 11/15/2017 Document Reviewed: 12/08/2015 Elsevier Patient Education  [place]. How to Use Chlorhexidine for Bathing Chlorhexidine gluconate (CHG) is a germ-killing (antiseptic) solution that is used to clean the skin. It can get rid of the bacteria that normally live on the skin and can keep them away for  about 24 hours. To clean your skin with CHG, you may be given:  A CHG solution to use in the shower or as part of a sponge bath.  A prepackaged cloth that contains CHG. Cleaning your skin with CHG may help lower the risk for infection:  While you are staying in the intensive care unit of the hospital.  If you have a vascular access, such as a central line, to provide short-term or Bohall-term access to your veins.  If you have a catheter to drain urine from your bladder.  If you are on a ventilator. A ventilator is a machine that helps you breathe by moving air in and out of your lungs.  After surgery. What are the risks? Risks of using CHG include:  A skin reaction.  Hearing loss, if CHG gets in your ears.  Eye injury, if CHG gets in your eyes and is not rinsed out.  The CHG product catching fire. Make sure that you avoid smoking and flames after applying CHG to your skin. Do not use CHG:  If you have a chlorhexidine allergy or have previously reacted to chlorhexidine.  On babies younger than 22 months of age. How to use CHG solution  Use CHG only as told by your health care provider, and follow the instructions on the label.  Use the full amount of CHG as directed. Usually, this is one bottle. During a shower Follow these steps when using CHG solution during a shower (unless your health care provider gives you different instructions): 1. Start the shower. 2. Use your normal soap and shampoo to wash your face and hair.  3. Turn off the shower or move out of the shower stream. 4. Pour the CHG onto a clean washcloth. Do not use any type of brush or rough-edged sponge. 5. Starting at your neck, lather your body down to your toes. Make sure you follow these instructions: ? If you will be having surgery, pay special attention to the part of your body where you will be having surgery. Scrub this area for at least 1 minute. ? Do not use CHG on your head or face. If the solution  gets into your ears or eyes, rinse them well with water. ? Avoid your genital area. ? Avoid any areas of skin that have broken skin, cuts, or scrapes. ? Scrub your back and under your arms. Make sure to wash skin folds. 6. Let the lather sit on your skin for 1-2 minutes or as [place] as told by your health care provider. 7. Thoroughly rinse your entire body in the shower. Make sure that all body creases and crevices are rinsed well. 8. Dry off with a clean towel. Do not put any substances on your body afterward--such as powder, lotion, or perfume--unless you are told to do so by your health care provider. Only use lotions that are recommended by the manufacturer. 9. Put on clean clothes or pajamas. 10. If it is the night before your surgery, sleep in clean sheets.  During a sponge bath Follow these steps when using CHG solution during a sponge bath (unless your health care provider gives you different instructions): 1. Use your normal soap and shampoo to wash your face and hair. 2. Pour the CHG onto a clean washcloth. 3. Starting at your neck, lather your body down to your toes. Make sure you follow these instructions: ? If you will be having surgery, pay special attention to the part of your body where you will be having surgery. Scrub this area for at least 1 minute. ? Do not use CHG on your head or face. If the solution gets into your ears or eyes, rinse them well with water. ? Avoid your genital area. ? Avoid any areas of skin that have broken skin, cuts, or scrapes. ? Scrub your back and under your arms. Make sure to wash skin folds. 4. Let the lather sit on your skin for 1-2 minutes or as [place] as told by your health care provider. 5. Using a different clean, wet washcloth, thoroughly rinse your entire body. Make sure that all body creases and crevices are rinsed well. 6. Dry off with a clean towel. Do not put any substances on your body afterward--such as powder, lotion, or perfume--unless  you are told to do so by your health care provider. Only use lotions that are recommended by the manufacturer. 7. Put on clean clothes or pajamas. 8. If it is the night before your surgery, sleep in clean sheets. How to use CHG prepackaged cloths  Only use CHG cloths as told by your health care provider, and follow the instructions on the label.  Use the CHG cloth on clean, dry skin.  Do not use the CHG cloth on your head or face unless your health care provider tells you to.  When washing with the CHG cloth: ? Avoid your genital area. ? Avoid any areas of skin that have broken skin, cuts, or scrapes. Before surgery Follow these steps when using a CHG cloth to clean before surgery (unless your health care provider gives you different instructions): 1. Using the CHG cloth,  vigorously scrub the part of your body where you will be having surgery. Scrub using a back-and-forth motion for 3 minutes. The area on your body should be completely wet with CHG when you are done scrubbing. 2. Do not rinse. Discard the cloth and let the area air-dry. Do not put any substances on the area afterward, such as powder, lotion, or perfume. 3. Put on clean clothes or pajamas. 4. If it is the night before your surgery, sleep in clean sheets.  For general bathing Follow these steps when using CHG cloths for general bathing (unless your health care provider gives you different instructions). 1. Use a separate CHG cloth for each area of your body. Make sure you wash between any folds of skin and between your fingers and toes. Wash your body in the following order, switching to a new cloth after each step: ? The front of your neck, shoulders, and chest. ? Both of your arms, under your arms, and your hands. ? Your stomach and groin area, avoiding the genitals. ? Your right leg and foot. ? Your left leg and foot. ? The back of your neck, your back, and your buttocks. 2. Do not rinse. Discard the cloth and let the  area air-dry. Do not put any substances on your body afterward--such as powder, lotion, or perfume--unless you are told to do so by your health care provider. Only use lotions that are recommended by the manufacturer. 3. Put on clean clothes or pajamas. Contact a health care provider if:  Your skin gets irritated after scrubbing.  You have questions about using your solution or cloth. Get help right away if:  Your eyes become very red or swollen.  Your eyes itch badly.  Your skin itches badly and is red or swollen.  Your hearing changes.  You have trouble seeing.  You have swelling or tingling in your mouth or throat.  You have trouble breathing.  You swallow any chlorhexidine. Summary  Chlorhexidine gluconate (CHG) is a germ-killing (antiseptic) solution that is used to clean the skin. Cleaning your skin with CHG may help to lower your risk for infection.  You may be given CHG to use for bathing. It may be in a bottle or in a prepackaged cloth to use on your skin. Carefully follow your health care provider's instructions and the instructions on the product label.  Do not use CHG if you have a chlorhexidine allergy.  Contact your health care provider if your skin gets irritated after scrubbing. This information is not intended to replace advice given to you by your health care provider. Make sure you discuss any questions you have with your health care provider. Document Revised: 11/03/2018 Document Reviewed: 07/15/2017 Elsevier Patient Education  [place].

## 2020-07-08 ENCOUNTER — Encounter (HOSPITAL_COMMUNITY): Payer: Self-pay

## 2020-07-08 ENCOUNTER — Other Ambulatory Visit: Payer: Self-pay

## 2020-07-08 ENCOUNTER — Other Ambulatory Visit (HOSPITAL_COMMUNITY)
Admission: RE | Admit: 2020-07-08 | Discharge: 2020-07-08 | Disposition: A | Discharge status: Home / Self Care | Payer: Medicare HMO | Source: Ambulatory Visit | Attending: Orthopedic Surgery | Admitting: Orthopedic Surgery

## 2020-07-08 ENCOUNTER — Encounter (HOSPITAL_COMMUNITY)
Admission: RE | Admit: 2020-07-08 | Discharge: 2020-07-08 | Disposition: A | Discharge status: Home / Self Care | Payer: Medicare HMO | Source: Ambulatory Visit | Attending: Orthopedic Surgery | Admitting: Orthopedic Surgery

## 2020-07-08 DIAGNOSIS — Z01812 Encounter for preprocedural laboratory examination: Secondary | ICD-10-CM | POA: Insufficient documentation

## 2020-07-08 DIAGNOSIS — Z20822 Contact with and (suspected) exposure to covid-19: Secondary | ICD-10-CM | POA: Insufficient documentation

## 2020-07-08 HISTORY — DX: Other complications of anesthesia, initial encounter: T88.59XA

## 2020-07-08 LAB — BASIC METABOLIC PANEL
Anion gap: 10 (ref 5–15)
BUN: 27 mg/dL — ABNORMAL HIGH (ref 8–23)
CO2: 22 mmol/L (ref 22–32)
Calcium: 9 mg/dL (ref 8.9–10.3)
Chloride: 109 mmol/L (ref 98–111)
Creatinine, Ser: 1.66 mg/dL — ABNORMAL HIGH (ref 0.61–1.24)
GFR, Estimated: 46 mL/min — ABNORMAL LOW (ref >60)
Glucose, Bld: 111 mg/dL — ABNORMAL HIGH (ref 70–99)
Potassium: 3.8 mmol/L (ref 3.5–5.1)
Sodium: 141 mmol/L (ref 135–145)

## 2020-07-08 LAB — CBC WITH DIFFERENTIAL/PLATELET
Abs Immature Granulocytes: 0.02 10*3/uL (ref 0.00–0.07)
Basophils Absolute: 0.1 10*3/uL (ref 0.0–0.1)
Basophils Relative: 1 %
Eosinophils Absolute: 0.8 10*3/uL — ABNORMAL HIGH (ref 0.0–0.5)
Eosinophils Relative: 9 %
HCT: 38.9 % — ABNORMAL LOW (ref 39.0–52.0)
Hemoglobin: 12.8 g/dL — ABNORMAL LOW (ref 13.0–17.0)
Immature Granulocytes: 0 %
Lymphocytes Relative: 22 %
Lymphs Abs: 2 10*3/uL (ref 0.7–4.0)
MCH: 32.8 pg (ref 26.0–34.0)
MCHC: 32.9 g/dL (ref 30.0–36.0)
MCV: 99.7 fL (ref 80.0–100.0)
Monocytes Absolute: 1 10*3/uL (ref 0.1–1.0)
Monocytes Relative: 11 %
Neutro Abs: 5 10*3/uL (ref 1.7–7.7)
Neutrophils Relative %: 57 %
Platelets: 194 10*3/uL (ref 150–400)
RBC: 3.9 MIL/uL — ABNORMAL LOW (ref 4.22–5.81)
RDW: 13.3 % (ref 11.5–15.5)
WBC: 8.9 10*3/uL (ref 4.0–10.5)
nRBC: 0 % (ref 0.0–0.2)

## 2020-07-08 LAB — SARS CORONAVIRUS 2 (TAT 6-24 HRS): SARS Coronavirus 2: NEGATIVE
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
## 2020-07-09 ENCOUNTER — Ambulatory Visit (HOSPITAL_COMMUNITY): Payer: Medicare HMO | Admitting: Anesthesiology

## 2020-07-09 ENCOUNTER — Encounter (HOSPITAL_COMMUNITY): Payer: Self-pay | Admitting: Orthopedic Surgery

## 2020-07-09 ENCOUNTER — Encounter (HOSPITAL_COMMUNITY)
Admission: RE | Disposition: A | Discharge status: Home / Self Care | Payer: Self-pay | Source: Home / Self Care | Attending: Orthopedic Surgery

## 2020-07-09 ENCOUNTER — Ambulatory Visit (HOSPITAL_COMMUNITY)
Admission: RE | Admit: 2020-07-09 | Discharge: 2020-07-09 | Disposition: A | Discharge status: Home / Self Care | Payer: Medicare HMO | Attending: Orthopedic Surgery | Admitting: Orthopedic Surgery

## 2020-07-09 DIAGNOSIS — F17210 Nicotine dependence, cigarettes, uncomplicated: Secondary | ICD-10-CM | POA: Diagnosis not present

## 2020-07-09 DIAGNOSIS — Z7984 Long term (current) use of oral hypoglycemic drugs: Secondary | ICD-10-CM | POA: Insufficient documentation

## 2020-07-09 DIAGNOSIS — M67442 Ganglion, left hand: Secondary | ICD-10-CM

## 2020-07-09 DIAGNOSIS — L720 Epidermal cyst: Secondary | ICD-10-CM | POA: Insufficient documentation

## 2020-07-09 DIAGNOSIS — R2232 Localized swelling, mass and lump, left upper limb: Secondary | ICD-10-CM | POA: Diagnosis present

## 2020-07-09 DIAGNOSIS — M6740 Ganglion, unspecified site: Secondary | ICD-10-CM

## 2020-07-09 HISTORY — PX: EXCISION MASS UPPER EXTREMETIES: SHX6704

## 2020-07-09 LAB — GLUCOSE, CAPILLARY
Glucose-Capillary: 117 mg/dL — ABNORMAL HIGH (ref 70–99)
Glucose-Capillary: 98 mg/dL (ref 70–99)

## 2020-07-09 SURGERY — EXCISION MASS UPPER EXTREMITIES
Anesthesia: General | Site: Hand | Laterality: Left

## 2020-07-09 MED ORDER — DEXAMETHASONE SODIUM PHOSPHATE 4 MG/ML IJ SOLN
INTRAMUSCULAR | Status: DC | PRN
  Administered 2020-07-09: 10 mg via INTRAVENOUS

## 2020-07-09 MED ORDER — GLYCOPYRROLATE 0.2 MG/ML IJ SOLN
INTRAMUSCULAR | Status: DC | PRN
  Administered 2020-07-09: .2 mg via INTRAVENOUS

## 2020-07-09 MED ORDER — MEPERIDINE HCL 50 MG/ML IJ SOLN: 6.2500 mg | INTRAMUSCULAR | Status: DC | PRN

## 2020-07-09 MED ORDER — ACETAMINOPHEN 500 MG PO TABS
1000.0000 mg | ORAL_TABLET | Freq: Once | ORAL | Status: AC
  Administered 2020-07-09: 1000 mg via ORAL
  Filled 2020-07-09: qty 2

## 2020-07-09 MED ORDER — FENTANYL CITRATE (PF) 100 MCG/2ML IJ SOLN
INTRAMUSCULAR | Status: DC | PRN
  Administered 2020-07-09: 100 ug via INTRAVENOUS

## 2020-07-09 MED ORDER — LIDOCAINE HCL (PF) 1 % IJ SOLN
INTRAMUSCULAR | Status: AC
  Filled 2020-07-09: qty 30

## 2020-07-09 MED ORDER — CEFAZOLIN SODIUM-DEXTROSE 2-4 GM/100ML-% IV SOLN
2.0000 g | INTRAVENOUS | Status: AC
  Administered 2020-07-09: 2 g via INTRAVENOUS
  Filled 2020-07-09: qty 100

## 2020-07-09 MED ORDER — SUCCINYLCHOLINE CHLORIDE 200 MG/10ML IV SOSY
PREFILLED_SYRINGE | INTRAVENOUS | Status: AC
  Filled 2020-07-09: qty 10

## 2020-07-09 MED ORDER — SUCCINYLCHOLINE CHLORIDE 20 MG/ML IJ SOLN
INTRAMUSCULAR | Status: DC | PRN
  Administered 2020-07-09: 180 mg via INTRAVENOUS

## 2020-07-09 MED ORDER — MIDAZOLAM HCL 5 MG/5ML IJ SOLN
INTRAMUSCULAR | Status: DC | PRN
  Administered 2020-07-09: 2 mg via INTRAVENOUS

## 2020-07-09 MED ORDER — PROPOFOL 10 MG/ML IV BOLUS
INTRAVENOUS | Status: AC
  Filled 2020-07-09: qty 40

## 2020-07-09 MED ORDER — SODIUM CHLORIDE 0.9 % IR SOLN
Status: DC | PRN
  Administered 2020-07-09: 1000 mL

## 2020-07-09 MED ORDER — ONDANSETRON HCL 4 MG/2ML IJ SOLN
INTRAMUSCULAR | Status: DC | PRN
  Administered 2020-07-09: 4 mg via INTRAVENOUS

## 2020-07-09 MED ORDER — PROPOFOL 10 MG/ML IV BOLUS
INTRAVENOUS | Status: DC | PRN
  Administered 2020-07-09: 200 mg via INTRAVENOUS

## 2020-07-09 MED ORDER — MIDAZOLAM HCL 2 MG/2ML IJ SOLN
INTRAMUSCULAR | Status: AC
  Filled 2020-07-09: qty 2

## 2020-07-09 MED ORDER — HYDROCODONE-ACETAMINOPHEN 10-325 MG PO TABS: 1.0000 | ORAL_TABLET | ORAL | 0 refills | Status: AC | PRN

## 2020-07-09 MED ORDER — FENTANYL CITRATE (PF) 100 MCG/2ML IJ SOLN
INTRAMUSCULAR | Status: AC
Start: 2020-07-09 — End: ?
  Filled 2020-07-09: qty 2

## 2020-07-09 MED ORDER — GLYCOPYRROLATE PF 0.2 MG/ML IJ SOSY
PREFILLED_SYRINGE | INTRAMUSCULAR | Status: AC
  Filled 2020-07-09: qty 1

## 2020-07-09 MED ORDER — ORAL CARE MOUTH RINSE: 15.0000 mL | Freq: Once | OROMUCOSAL | Status: AC

## 2020-07-09 MED ORDER — CHLORHEXIDINE GLUCONATE 0.12 % MT SOLN
15.0000 mL | Freq: Once | OROMUCOSAL | Status: AC
  Administered 2020-07-09: 15 mL via OROMUCOSAL
  Filled 2020-07-09: qty 15

## 2020-07-09 MED ORDER — LACTATED RINGERS IV SOLN
Freq: Once | INTRAVENOUS | Status: AC
  Administered 2020-07-09: 1000 mL via INTRAVENOUS

## 2020-07-09 MED ORDER — PROMETHAZINE HCL 25 MG/ML IJ SOLN: 6.2500 mg | INTRAMUSCULAR | Status: DC | PRN

## 2020-07-09 MED ORDER — ONDANSETRON HCL 4 MG/2ML IJ SOLN
INTRAMUSCULAR | Status: AC
  Filled 2020-07-09: qty 2

## 2020-07-09 MED ORDER — HYDROMORPHONE HCL 1 MG/ML IJ SOLN: 0.2500 mg | INTRAMUSCULAR | Status: DC | PRN

## 2020-07-09 MED ORDER — DEXAMETHASONE SODIUM PHOSPHATE 10 MG/ML IJ SOLN
INTRAMUSCULAR | Status: AC
  Filled 2020-07-09: qty 1

## 2020-07-09 MED ORDER — LIDOCAINE HCL (CARDIAC) PF 100 MG/5ML IV SOSY
PREFILLED_SYRINGE | INTRAVENOUS | Status: DC | PRN
  Administered 2020-07-09: 100 mg via INTRAVENOUS

## 2020-07-09 MED ORDER — BUPIVACAINE HCL (PF) 0.5 % IJ SOLN
INTRAMUSCULAR | Status: DC | PRN
  Administered 2020-07-09: 10 mL

## 2020-07-09 MED ORDER — BUPIVACAINE HCL (PF) 0.5 % IJ SOLN
INTRAMUSCULAR | Status: AC
  Filled 2020-07-09: qty 30

## 2020-07-09 MED ORDER — LIDOCAINE 2% (20 MG/ML) 5 ML SYRINGE
INTRAMUSCULAR | Status: AC
  Filled 2020-07-09: qty 5

## 2020-07-09 SURGICAL SUPPLY — 46 items
BANDAGE ESMARK 4X12 BL STRL LF (DISPOSABLE) IMPLANT
BLADE SURG 15 STRL LF DISP TIS (BLADE) IMPLANT
BLADE SURG 15 STRL SS (BLADE) ×2
BLADE SURG SZ10 CARB STEEL (BLADE) ×1 IMPLANT
BNDG CMPR 12X4 ELC STRL LF (DISPOSABLE) ×1
BNDG CMPR STD VLCR NS LF 5.8X3 (GAUZE/BANDAGES/DRESSINGS)
BNDG CMPR STD VLCR NS LF 5.8X6 (GAUZE/BANDAGES/DRESSINGS)
BNDG COHESIVE 4X5 TAN STRL (GAUZE/BANDAGES/DRESSINGS) ×2 IMPLANT
BNDG ELASTIC 3X5.8 VLCR NS LF (GAUZE/BANDAGES/DRESSINGS) IMPLANT
BNDG ELASTIC 6X5.8 VLCR NS LF (GAUZE/BANDAGES/DRESSINGS) IMPLANT
BNDG ESMARK 4X12 BLUE STRL LF (DISPOSABLE) ×2
CLOTH BEACON ORANGE TIMEOUT ST (SAFETY) ×2 IMPLANT
COVER LIGHT HANDLE STERIS (MISCELLANEOUS) ×4 IMPLANT
COVER WAND RF STERILE (DRAPES) ×2 IMPLANT
CUFF TOURN SGL QUICK 18X4 (TOURNIQUET CUFF) ×2 IMPLANT
DECANTER SPIKE VIAL GLASS SM (MISCELLANEOUS) ×2 IMPLANT
DRSG XEROFORM 1X8 (GAUZE/BANDAGES/DRESSINGS) ×2 IMPLANT
DURAPREP 26ML APPLICATOR (WOUND CARE) ×1 IMPLANT
ELECT NDL TIP 2.8 STRL (NEEDLE) ×1 IMPLANT
ELECT NEEDLE TIP 2.8 STRL (NEEDLE) ×2 IMPLANT
ELECT REM PT RETURN 9FT ADLT (ELECTROSURGICAL) ×2
ELECTRODE REM PT RTRN 9FT ADLT (ELECTROSURGICAL) ×1 IMPLANT
GAUZE SPONGE 4X4 12PLY STRL (GAUZE/BANDAGES/DRESSINGS) ×2 IMPLANT
GLOVE BIOGEL PI IND STRL 7.0 (GLOVE) ×2 IMPLANT
GLOVE BIOGEL PI INDICATOR 7.0 (GLOVE) ×2
GLOVE SKINSENSE NS SZ8.0 LF (GLOVE) ×1
GLOVE SKINSENSE STRL SZ8.0 LF (GLOVE) ×1 IMPLANT
GLOVE SS N UNI LF 8.5 STRL (GLOVE) ×2 IMPLANT
GOWN STRL REUS W/TWL LRG LVL3 (GOWN DISPOSABLE) ×2 IMPLANT
GOWN STRL REUS W/TWL XL LVL3 (GOWN DISPOSABLE) ×2 IMPLANT
INST SET MINOR BONE (KITS) IMPLANT
KIT TURNOVER KIT A (KITS) ×2 IMPLANT
MANIFOLD NEPTUNE II (INSTRUMENTS) ×2 IMPLANT
NDL HYPO 21X1.5 SAFETY (NEEDLE) ×1 IMPLANT
NEEDLE HYPO 21X1.5 SAFETY (NEEDLE) ×2 IMPLANT
NS IRRIG 1000ML POUR BTL (IV SOLUTION) ×2 IMPLANT
PACK BASIC LIMB (CUSTOM PROCEDURE TRAY) ×2 IMPLANT
PAD ARMBOARD 7.5X6 YLW CONV (MISCELLANEOUS) ×2 IMPLANT
POSITIONER HAND ALUMI XLG (MISCELLANEOUS) ×1 IMPLANT
SET BASIN LINEN APH (SET/KITS/TRAYS/PACK) ×2 IMPLANT
STRIP CLOSURE SKIN 1/2X4 (GAUZE/BANDAGES/DRESSINGS) ×2 IMPLANT
SUT ETHILON 3 0 FSL (SUTURE) ×2 IMPLANT
SUT MON AB 2-0 SH 27 (SUTURE) ×2
SUT MON AB 2-0 SH27 (SUTURE) ×1 IMPLANT
SUT PROLENE 3 0 PS 2 (SUTURE) IMPLANT
SYR CONTROL 10ML LL (SYRINGE) ×2 IMPLANT

## 2020-07-09 NOTE — Op Note (Signed)
07/09/2020  11:18 AM  PATIENT:  Brian [name]  64 y.o. male  PRE-OPERATIVE DIAGNOSIS:  mass left middle finger  POST-OPERATIVE DIAGNOSIS:  mass left middle finger  PROCEDURE:  Procedure(s): EXCISION MASS LEFT [place] FINGER (Left)   FINDINGS: LARGE BIPOLAR MASS WITH GREEN-YELLOW THICK MATERIAL INSIDE  3 X 1.5 CM , SUPERFICIAL TO FLEXOR TENDON   SURGEON:  Surgeon(s) and Role:    * Brian Civil, MD - Primary  64 year old male with mass left longer middle finger for 20 to 30 years presented for excision due to pain  The procedure was formed as follows  The patient was seen in the preop area the surgical site was confirmed marked in the chart was reviewed.  There were no impediments to surgery.  He was taken to the operating for general anesthesia his left arm was prepped and draped sterilely timeout was completed.  The limb was exsanguinated to 4 inch Esmarch tourniquet was elevated 220 mmHg  I made an oblique incision across the middle phalanx on the volar side divide the subcutaneous tissue I found a bipolar cystic structure it had 2 lobes.  It was superficial to the flexor tendons it appeared to have a stalk coming from the DIP joint perhaps.  It perhaps also could've come from the flexor tendon sheath  The flexor tendons were intact  The neurovascular structures were protected during the blunt dissection of the mass.  Once the mass was removed the flexor tendons were inspected again the incisional area was irrigated and then 3-0 nylon interrupted sutures were used to close the wound.  We did a local block at the site of the wound with 5 cc of half percent Marcaine and then a digital block of the [name]  Sterile dressing was applied tourniquet was released color came back to normal patient extubated taken to recovery room stable condition  PHYSICIAN ASSISTANT:   ASSISTANTS: none   ANESTHESIA:   general  EBL:  NONE   BLOOD ADMINISTERED:none  DRAINS:  none   LOCAL MEDICATIONS USED:  MARCAINE     SPECIMEN:  Source of Specimen:  LEFT Brian [name] FINGER/MIDDLE FINGER   DISPOSITION OF SPECIMEN:  PATHOLOGY  COUNTS:  YES  TOURNIQUET:   Total Tourniquet Time Documented: Upper Arm (Left) - 20 minutes Total: Upper Arm (Left) - 20 minutes   DICTATION: .Brian [name] Dictation  PLAN OF CARE: Discharge to home after PACU  PATIENT DISPOSITION:  PACU - hemodynamically stable.   Delay start of Pharmacological VTE agent (>24hrs) due to surgical blood loss or risk of bleeding: not applicable

## 2020-07-09 NOTE — Anesthesia Procedure Notes (Signed)
Procedure Name: Intubation Performed by: Tacy Learn, CRNA Pre-anesthesia Checklist: Patient identified, Emergency Drugs available, Suction available, Patient being monitored and Timeout performed Patient Re-evaluated:Patient Re-evaluated prior to induction Oxygen Delivery Method: Circle system utilized Preoxygenation: Pre-oxygenation with 100% oxygen Induction Type: IV induction Laryngoscope Size: Miller and 3 Grade View: Grade I Tube size: 7.5 mm Number of attempts: 1 Airway Equipment and Method: Stylet Placement Confirmation: ETT inserted through vocal cords under direct vision,  positive ETCO2,  CO2 detector and breath sounds checked- equal and bilateral Secured at: 23 cm Tube secured with: Tape Dental Injury: Teeth and Oropharynx as per pre-operative assessment

## 2020-07-09 NOTE — Interval H&P Note (Signed)
History and Physical Interval Note:  07/09/2020 10:29 AM  Brian [name]  has presented today for surgery, with the diagnosis of mass left middle finger.  The various methods of treatment have been discussed with the patient and family. After consideration of risks, benefits and other options for treatment, the patient has consented to  Procedure(s) with comments: EXCISION MASS LEFT D[place]ond FINGER (Left) - patient wants anesthesia to know he has history of waking up during anesthesia (during recent endoscopy)  as a surgical intervention.  The patient's history has been reviewed, patient examined, no change in status, stable for surgery.  I have reviewed the patient's chart and labs.  Questions were answered to the patient's satisfaction.     Arther Abbott

## 2020-07-09 NOTE — Anesthesia Preprocedure Evaluation (Signed)
Anesthesia Evaluation  Patient identified by MRN, date of birth, ID band Patient awake    Reviewed: Allergy & Precautions, NPO status , Patient's Chart, lab work & pertinent test results  History of Anesthesia Complications (+) history of anesthetic complications  Airway Mallampati: II  TM Distance: >3 FB Neck ROM: Full    Dental  (+) Dental Advisory Given, Missing   Pulmonary shortness of breath, sleep apnea , Current Smoker and Patient abstained from smoking.,    Pulmonary exam normal breath sounds clear to auscultation       Cardiovascular hypertension, Pt. on medications Normal cardiovascular exam Rhythm:Regular Rate:Normal     Neuro/Psych  Headaches, Seizures -, Well Controlled,  PSYCHIATRIC DISORDERS Anxiety Depression    GI/Hepatic Neg liver ROS, GERD  Medicated,  Endo/Other  diabetes, Well Controlled, Type 2, Oral Hypoglycemic Agents  Renal/GU negative Renal ROS     Musculoskeletal  (+) Arthritis ,   Abdominal   Peds  Hematology   Anesthesia Other Findings   Reproductive/Obstetrics                             Anesthesia Physical Anesthesia Plan  ASA: III  Anesthesia Plan: General   Post-op Pain Management:    Induction: Intravenous  PONV Risk Score and Plan: 2 and Midazolam, Dexamethasone and Ondansetron  Airway Management Planned: Oral ETT  Additional Equipment:   Intra-op Plan:   Post-operative Plan: Extubation in OR  Informed Consent: I have reviewed the patients History and Physical, chart, labs and discussed the procedure including the risks, benefits and alternatives for the proposed anesthesia with the patient or authorized representative who has indicated his/her understanding and acceptance.     Dental advisory given  Plan Discussed with: CRNA and Surgeon  Anesthesia Plan Comments:         Anesthesia Quick Evaluation

## 2020-07-09 NOTE — Brief Op Note (Signed)
07/09/2020  11:18 AM  PATIENT:  Brian [name]  64 y.o. male  PRE-OPERATIVE DIAGNOSIS:  mass left middle finger  POST-OPERATIVE DIAGNOSIS:  mass left middle finger  PROCEDURE:  Procedure(s): EXCISION MASS LEFT Brian [name] FINGER (Left)   FINDINGS: LARGE BIPOLAR MASS WITH GREEN-YELLOW THICK MATERIAL INSIDE  3 X 1.5 CM , SUPERFICIAL TO FLEXOR TENDON   SURGEON:  Surgeon(s) and Role:    * Brian Civil, MD - Primary  PHYSICIAN ASSISTANT:   ASSISTANTS: none   ANESTHESIA:   general  EBL:  NONE   BLOOD ADMINISTERED:none  DRAINS: none   LOCAL MEDICATIONS USED:  MARCAINE     SPECIMEN:  Source of Specimen:  LEFT Brian [name] FINGER/MIDDLE FINGER   DISPOSITION OF SPECIMEN:  PATHOLOGY  COUNTS:  YES  TOURNIQUET:   Total Tourniquet Time Documented: Upper Arm (Left) - 20 minutes Total: Upper Arm (Left) - 20 minutes   DICTATION: .Dragon Dictation  PLAN OF CARE: Discharge to home after PACU  PATIENT DISPOSITION:  PACU - hemodynamically stable.   Delay start of Pharmacological VTE agent (>24hrs) due to surgical blood loss or risk of bleeding: not applicable

## 2020-07-09 NOTE — Anesthesia Postprocedure Evaluation (Signed)
Anesthesia Post Note  Patient: Brian [name]  Procedure(s) Performed: EXCISION MASS LEFT Tamura FINGER (Left Hand)  Patient location during evaluation: PACU Anesthesia Type: General Level of consciousness: awake, oriented, patient cooperative and awake and alert Pain management: pain level controlled Vital Signs Assessment: post-procedure vital signs reviewed and stable Respiratory status: spontaneous breathing, respiratory function stable and nonlabored ventilation Cardiovascular status: blood pressure returned to baseline and stable Postop Assessment: no headache and no backache Anesthetic complications: no   No complications documented.   Last Vitals:  Vitals:   07/09/20 0857  BP: 129/77  Resp: 18  Temp: 36.6 C  SpO2: 98%    Last Pain:  Vitals:   07/09/20 0857  TempSrc: Oral  PainSc: 0-No pain                 Tacy Learn

## 2020-07-09 NOTE — Transfer of Care (Signed)
Immediate Anesthesia Transfer of Care Note  Patient: Trampas Stettner Cerullo  Procedure(s) Performed: EXCISION MASS LEFT Fitch FINGER (Left Hand)  Patient Location: PACU  Anesthesia Type:General  Level of Consciousness: awake, alert , oriented and patient cooperative  Airway & Oxygen Therapy: Patient Spontanous Breathing  Post-op Assessment: Report given to RN, Post -op Vital signs reviewed and stable and Patient moving all extremities  Post vital signs: Reviewed and stable  Last Vitals:  Vitals Value Taken Time  BP    Temp    Pulse 78 07/09/20 1125  Resp 16 07/09/20 1125  SpO2 87 % 07/09/20 1125  Vitals shown include unvalidated device data.  Last Pain:  Vitals:   07/09/20 0857  TempSrc: Oral  PainSc: 0-No pain      Patients Stated Pain Goal: 8 (37/95/58 3167)  Complications: No complications documented.

## 2020-07-10 ENCOUNTER — Encounter (HOSPITAL_COMMUNITY): Payer: Self-pay | Admitting: Orthopedic Surgery

## 2020-07-10 ENCOUNTER — Telehealth: Payer: Self-pay | Admitting: Orthopedic Surgery

## 2020-07-10 NOTE — Telephone Encounter (Signed)
I moved the appt to 840 am tomorrow.

## 2020-07-10 NOTE — Telephone Encounter (Signed)
Patient called this afternoon following surgery yesterday, 07/09/20, relaying that his bandage feels too tight. I've scheduled him to come in tomorrow, 4:20pm, and relayed we will also send message to clinic staff if further advice.

## 2020-07-10 NOTE — Telephone Encounter (Signed)
I told him I do not want it too tight, offered for him to come here or unwrap it himself he said he cant get here today. He said he also doesn't feel comfortable unwrapping it himself. He wants to know if he can come tomorrow am instead of tomorrow pm  I told him 840

## 2020-07-11 ENCOUNTER — Other Ambulatory Visit: Payer: Self-pay

## 2020-07-11 ENCOUNTER — Ambulatory Visit (INDEPENDENT_AMBULATORY_CARE_PROVIDER_SITE_OTHER): Payer: Medicare HMO | Admitting: Orthopedic Surgery

## 2020-07-11 ENCOUNTER — Encounter: Payer: Self-pay | Admitting: Orthopedic Surgery

## 2020-07-11 DIAGNOSIS — M67442 Ganglion, left hand: Secondary | ICD-10-CM

## 2020-07-11 DIAGNOSIS — Z4889 Encounter for other specified surgical aftercare: Secondary | ICD-10-CM

## 2020-07-11 LAB — SURGICAL PATHOLOGY

## 2020-07-11 NOTE — Progress Notes (Signed)
Chief Complaint  Patient presents with  . Post-op Problem    bandage too tight post op ganglion removal finger 07/09/20 unwrapped last pm feels better but states has numbness tip of finger left [place]   Status post ganglion cyst excision left [place] finger.  His finger got tight he took the dressing off at our request he comes in for checkup he has some numbness at the tip of the finger which is getting better he can already make a full fist his path report is pending  Follow-up for suture removal with nurse  And then 4 weeks after that   Encounter Diagnoses  Name Primary?  . 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 Aftercare following surgery

## 2020-07-11 NOTE — Patient Instructions (Addendum)
Change appt to nov 23 for suture removal

## 2020-07-17 ENCOUNTER — Ambulatory Visit: Payer: Medicare HMO | Admitting: Orthopedic Surgery

## 2020-07-23 ENCOUNTER — Ambulatory Visit (INDEPENDENT_AMBULATORY_CARE_PROVIDER_SITE_OTHER): Payer: Medicare HMO | Admitting: Radiology

## 2020-07-23 ENCOUNTER — Other Ambulatory Visit: Payer: Self-pay

## 2020-07-23 DIAGNOSIS — M67442 Ganglion, left hand: Secondary | ICD-10-CM

## 2020-07-23 NOTE — Progress Notes (Signed)
Patient presented to office for suture removal, sutures were removed without difficulty. He still complains of numbness fingertip and states he thinks the cyst may be returning.  I have advised him to follow up in 2 weeks with Dr Aline Brochure since he has concerns about cyst returning.

## 2020-08-07 ENCOUNTER — Other Ambulatory Visit: Payer: Self-pay

## 2020-08-07 ENCOUNTER — Ambulatory Visit (INDEPENDENT_AMBULATORY_CARE_PROVIDER_SITE_OTHER): Payer: Medicare HMO | Admitting: Orthopedic Surgery

## 2020-08-07 ENCOUNTER — Encounter: Payer: Self-pay | Admitting: Orthopedic Surgery

## 2020-08-07 ENCOUNTER — Ambulatory Visit: Payer: Medicare HMO | Admitting: Orthopedic Surgery

## 2020-08-07 DIAGNOSIS — M67442 Ganglion, left hand: Secondary | ICD-10-CM

## 2020-08-07 NOTE — Progress Notes (Signed)
Chief Complaint  Patient presents with  . Post-op Follow-up    lump is returning left hand middle IP s/p excison 07/09/20    Brian [name] had an excision of an epidermoid inclusion cyst that has been there 30 years  He says that the mass has returned his hand is hurting  Examination shows a well-healed incision he has some numbness on 1 side of the incision there is a recurrent firmness and fullness over the site of the surgery  I told him I don't want to do an unnecessary surgery so we should repeat his MRI to see if the mass has returned  MRI left hand/middle finger

## 2020-08-07 NOTE — Addendum Note (Signed)
Addended by: Elizabeth Sauer on: 08/07/2020 01:35 PM   Modules accepted: Orders

## 2020-08-07 NOTE — Addendum Note (Signed)
Addended by: Elizabeth Sauer on: 08/07/2020 02:25 PM   Modules accepted: Orders

## 2020-08-07 NOTE — Progress Notes (Signed)
Chief Complaint  Patient presents with  . Post-op Follow-up    lump is returning left hand middle IP s/p excison 07/09/20    Epidermoid inclusion cyst left [place] finger

## 2020-08-07 NOTE — Addendum Note (Signed)
Addended byCandice Camp on: 08/07/2020 12:14 PM   Modules accepted: Orders

## 2020-08-07 NOTE — Patient Instructions (Signed)
Repeat mri left hand / God[place] finger // recurrent mass

## 2020-08-07 NOTE — Addendum Note (Signed)
Addended byCandice Camp on: 08/07/2020 10:31 AM   Modules accepted: Orders

## 2020-08-13 ENCOUNTER — Ambulatory Visit: Payer: Medicare HMO | Admitting: Gastroenterology

## 2020-08-21 ENCOUNTER — Ambulatory Visit (HOSPITAL_COMMUNITY): Payer: Medicare HMO

## 2020-08-22 ENCOUNTER — Ambulatory Visit (HOSPITAL_COMMUNITY): Payer: Medicare HMO

## 2020-09-05 ENCOUNTER — Ambulatory Visit (HOSPITAL_COMMUNITY): Payer: Medicare HMO

## 2020-09-12 ENCOUNTER — Encounter (INDEPENDENT_AMBULATORY_CARE_PROVIDER_SITE_OTHER): Payer: Self-pay | Admitting: *Deleted

## 2020-09-12 ENCOUNTER — Telehealth (INDEPENDENT_AMBULATORY_CARE_PROVIDER_SITE_OTHER): Payer: Self-pay | Admitting: *Deleted

## 2020-09-12 NOTE — Telephone Encounter (Signed)
We rec'd referral from [place] - I advised them patient is a patient at [facility] - they said patient didn't want to go there anymore and wanted to know if we could schedule apt - I told them if patient didn't want to be patient there anymore I would schedule him an apt with our provider.

## 2020-09-16 ENCOUNTER — Other Ambulatory Visit: Payer: Self-pay | Admitting: Neurology

## 2020-09-16 ENCOUNTER — Other Ambulatory Visit (HOSPITAL_COMMUNITY): Payer: Self-pay | Admitting: Neurology

## 2020-09-16 DIAGNOSIS — M5416 Radiculopathy, lumbar region: Secondary | ICD-10-CM

## 2020-09-26 ENCOUNTER — Encounter (INDEPENDENT_AMBULATORY_CARE_PROVIDER_SITE_OTHER): Payer: Self-pay | Admitting: *Deleted

## 2020-10-04 ENCOUNTER — Ambulatory Visit (HOSPITAL_COMMUNITY): Payer: Medicare HMO

## 2020-10-04 ENCOUNTER — Encounter (HOSPITAL_COMMUNITY): Payer: Self-pay

## 2020-11-25 ENCOUNTER — Other Ambulatory Visit (HOSPITAL_COMMUNITY): Payer: Self-pay | Admitting: Neurology

## 2020-11-25 DIAGNOSIS — M5416 Radiculopathy, lumbar region: Secondary | ICD-10-CM

## 2020-11-27 ENCOUNTER — Other Ambulatory Visit (HOSPITAL_COMMUNITY): Payer: Self-pay | Admitting: Neurology

## 2020-11-27 DIAGNOSIS — M546 Pain in thoracic spine: Secondary | ICD-10-CM

## 2020-12-12 ENCOUNTER — Other Ambulatory Visit: Payer: Self-pay

## 2020-12-12 ENCOUNTER — Ambulatory Visit (HOSPITAL_COMMUNITY)
Admission: RE | Admit: 2020-12-12 | Discharge: 2020-12-12 | Disposition: A | Discharge status: Home / Self Care | Payer: Medicare Other | Source: Ambulatory Visit | Attending: Neurology | Admitting: Neurology

## 2020-12-12 DIAGNOSIS — M546 Pain in thoracic spine: Secondary | ICD-10-CM

## 2020-12-12 DIAGNOSIS — M5416 Radiculopathy, lumbar region: Secondary | ICD-10-CM | POA: Insufficient documentation

## 2020-12-12 DIAGNOSIS — K631 Perforation of intestine (nontraumatic): Secondary | ICD-10-CM | POA: Diagnosis not present

## 2020-12-12 DIAGNOSIS — K5720 Diverticulitis of large intestine with perforation and abscess without bleeding: Secondary | ICD-10-CM | POA: Diagnosis not present

## 2020-12-13 ENCOUNTER — Emergency Department (HOSPITAL_COMMUNITY): Payer: Medicare Other

## 2020-12-13 ENCOUNTER — Other Ambulatory Visit: Payer: Self-pay

## 2020-12-13 ENCOUNTER — Encounter (HOSPITAL_COMMUNITY): Payer: Self-pay | Admitting: Emergency Medicine

## 2020-12-13 ENCOUNTER — Inpatient Hospital Stay (HOSPITAL_COMMUNITY)
Admission: EM | Admit: 2020-12-13 | Discharge: 2020-12-15 | DRG: 392 | Disposition: A | Discharge status: Home / Self Care | Payer: Medicare Other | Attending: Internal Medicine | Admitting: Internal Medicine

## 2020-12-13 DIAGNOSIS — Z79899 Other long term (current) drug therapy: Secondary | ICD-10-CM | POA: Diagnosis not present

## 2020-12-13 DIAGNOSIS — G894 Chronic pain syndrome: Secondary | ICD-10-CM | POA: Diagnosis present

## 2020-12-13 DIAGNOSIS — E785 Hyperlipidemia, unspecified: Secondary | ICD-10-CM | POA: Diagnosis present

## 2020-12-13 DIAGNOSIS — Z9842 Cataract extraction status, left eye: Secondary | ICD-10-CM

## 2020-12-13 DIAGNOSIS — N1831 Chronic kidney disease, stage 3a: Secondary | ICD-10-CM | POA: Diagnosis present

## 2020-12-13 DIAGNOSIS — Z86010 Personal history of colonic polyps: Secondary | ICD-10-CM

## 2020-12-13 DIAGNOSIS — E1122 Type 2 diabetes mellitus with diabetic chronic kidney disease: Secondary | ICD-10-CM | POA: Diagnosis present

## 2020-12-13 DIAGNOSIS — M79632 Pain in left forearm: Secondary | ICD-10-CM | POA: Diagnosis present

## 2020-12-13 DIAGNOSIS — I129 Hypertensive chronic kidney disease with stage 1 through stage 4 chronic kidney disease, or unspecified chronic kidney disease: Secondary | ICD-10-CM | POA: Diagnosis present

## 2020-12-13 DIAGNOSIS — K5732 Diverticulitis of large intestine without perforation or abscess without bleeding: Secondary | ICD-10-CM | POA: Diagnosis not present

## 2020-12-13 DIAGNOSIS — Z888 Allergy status to other drugs, medicaments and biological substances status: Secondary | ICD-10-CM | POA: Diagnosis not present

## 2020-12-13 DIAGNOSIS — Z9841 Cataract extraction status, right eye: Secondary | ICD-10-CM

## 2020-12-13 DIAGNOSIS — E1140 Type 2 diabetes mellitus with diabetic neuropathy, unspecified: Secondary | ICD-10-CM | POA: Diagnosis present

## 2020-12-13 DIAGNOSIS — F419 Anxiety disorder, unspecified: Secondary | ICD-10-CM | POA: Diagnosis present

## 2020-12-13 DIAGNOSIS — K219 Gastro-esophageal reflux disease without esophagitis: Secondary | ICD-10-CM | POA: Diagnosis present

## 2020-12-13 DIAGNOSIS — F17210 Nicotine dependence, cigarettes, uncomplicated: Secondary | ICD-10-CM | POA: Diagnosis present

## 2020-12-13 DIAGNOSIS — E11618 Type 2 diabetes mellitus with other diabetic arthropathy: Secondary | ICD-10-CM | POA: Diagnosis not present

## 2020-12-13 DIAGNOSIS — K5720 Diverticulitis of large intestine with perforation and abscess without bleeding: Secondary | ICD-10-CM | POA: Diagnosis present

## 2020-12-13 DIAGNOSIS — G40909 Epilepsy, unspecified, not intractable, without status epilepticus: Secondary | ICD-10-CM | POA: Diagnosis present

## 2020-12-13 DIAGNOSIS — Z833 Family history of diabetes mellitus: Secondary | ICD-10-CM | POA: Diagnosis not present

## 2020-12-13 DIAGNOSIS — Z882 Allergy status to sulfonamides status: Secondary | ICD-10-CM

## 2020-12-13 DIAGNOSIS — Z7982 Long term (current) use of aspirin: Secondary | ICD-10-CM

## 2020-12-13 DIAGNOSIS — Z881 Allergy status to other antibiotic agents status: Secondary | ICD-10-CM

## 2020-12-13 DIAGNOSIS — Z8711 Personal history of peptic ulcer disease: Secondary | ICD-10-CM

## 2020-12-13 DIAGNOSIS — K631 Perforation of intestine (nontraumatic): Secondary | ICD-10-CM

## 2020-12-13 DIAGNOSIS — G4730 Sleep apnea, unspecified: Secondary | ICD-10-CM | POA: Diagnosis present

## 2020-12-13 DIAGNOSIS — Z20822 Contact with and (suspected) exposure to covid-19: Secondary | ICD-10-CM | POA: Diagnosis present

## 2020-12-13 DIAGNOSIS — E1143 Type 2 diabetes mellitus with diabetic autonomic (poly)neuropathy: Secondary | ICD-10-CM | POA: Diagnosis present

## 2020-12-13 DIAGNOSIS — Z9049 Acquired absence of other specified parts of digestive tract: Secondary | ICD-10-CM

## 2020-12-13 DIAGNOSIS — I10 Essential (primary) hypertension: Secondary | ICD-10-CM | POA: Diagnosis present

## 2020-12-13 DIAGNOSIS — E119 Type 2 diabetes mellitus without complications: Secondary | ICD-10-CM

## 2020-12-13 LAB — CBC WITH DIFFERENTIAL/PLATELET
Abs Immature Granulocytes: 0.03 10*3/uL (ref 0.00–0.07)
Basophils Absolute: 0.1 10*3/uL (ref 0.0–0.1)
Basophils Relative: 1 %
Eosinophils Absolute: 0.3 10*3/uL (ref 0.0–0.5)
Eosinophils Relative: 4 %
HCT: 40.8 % (ref 39.0–52.0)
Hemoglobin: 13.3 g/dL (ref 13.0–17.0)
Immature Granulocytes: 0 %
Lymphocytes Relative: 27 %
Lymphs Abs: 2.5 10*3/uL (ref 0.7–4.0)
MCH: 32.8 pg (ref 26.0–34.0)
MCHC: 32.6 g/dL (ref 30.0–36.0)
MCV: 100.7 fL — ABNORMAL HIGH (ref 80.0–100.0)
Monocytes Absolute: 0.8 10*3/uL (ref 0.1–1.0)
Monocytes Relative: 9 %
Neutro Abs: 5.4 10*3/uL (ref 1.7–7.7)
Neutrophils Relative %: 59 %
Platelets: 175 10*3/uL (ref 150–400)
RBC: 4.05 MIL/uL — ABNORMAL LOW (ref 4.22–5.81)
RDW: 13.2 % (ref 11.5–15.5)
WBC: 9.2 10*3/uL (ref 4.0–10.5)
nRBC: 0 % (ref 0.0–0.2)

## 2020-12-13 LAB — COMPREHENSIVE METABOLIC PANEL
ALT: 24 U/L (ref 0–44)
AST: 15 U/L (ref 15–41)
Albumin: 4.1 g/dL (ref 3.5–5.0)
Alkaline Phosphatase: 58 U/L (ref 38–126)
Anion gap: 7 (ref 5–15)
BUN: 30 mg/dL — ABNORMAL HIGH (ref 8–23)
CO2: 25 mmol/L (ref 22–32)
Calcium: 8.8 mg/dL — ABNORMAL LOW (ref 8.9–10.3)
Chloride: 108 mmol/L (ref 98–111)
Creatinine, Ser: 1.32 mg/dL — ABNORMAL HIGH (ref 0.61–1.24)
GFR, Estimated: >60 mL/min (ref >60)
Glucose, Bld: 100 mg/dL — ABNORMAL HIGH (ref 70–99)
Potassium: 4.4 mmol/L (ref 3.5–5.1)
Sodium: 140 mmol/L (ref 135–145)
Total Bilirubin: 0.6 mg/dL (ref 0.3–1.2)
Total Protein: 7 g/dL (ref 6.5–8.1)

## 2020-12-13 LAB — ETHANOL: Alcohol, Ethyl (B): <10 mg/dL (ref <10)

## 2020-12-13 LAB — ACETAMINOPHEN LEVEL: Acetaminophen (Tylenol), Serum: <10 ug/mL — ABNORMAL LOW (ref 10–30)

## 2020-12-13 LAB — LIPASE, BLOOD: Lipase: 49 U/L (ref 11–51)

## 2020-12-13 LAB — SALICYLATE LEVEL: Salicylate Lvl: <7 mg/dL — ABNORMAL LOW (ref 7.0–30.0)

## 2020-12-13 MED ORDER — MORPHINE SULFATE (PF) 2 MG/ML IV SOLN: 2.0000 mg | Freq: Once | INTRAVENOUS | Status: AC

## 2020-12-13 MED ORDER — BACID PO TABS
2.0000 | ORAL_TABLET | Freq: Three times a day (TID) | ORAL | Status: DC
  Filled 2020-12-13 (×8): qty 2

## 2020-12-13 MED ORDER — IOHEXOL 300 MG/ML  SOLN
100.0000 mL | Freq: Once | INTRAMUSCULAR | Status: AC | PRN
  Administered 2020-12-13: 100 mL via INTRAVENOUS

## 2020-12-13 MED ORDER — PIPERACILLIN-TAZOBACTAM 3.375 G IVPB 30 MIN
3.3750 g | Freq: Once | INTRAVENOUS | Status: AC
  Administered 2020-12-13: 3.375 g via INTRAVENOUS
  Filled 2020-12-13: qty 50

## 2020-12-13 MED ORDER — ACETAMINOPHEN 325 MG PO TABS: 650.0000 mg | ORAL_TABLET | Freq: Four times a day (QID) | ORAL | Status: DC | PRN

## 2020-12-13 MED ORDER — AMLODIPINE BESYLATE 5 MG PO TABS
5.0000 mg | ORAL_TABLET | Freq: Every day | ORAL | Status: DC
  Administered 2020-12-14 – 2020-12-15 (×2): 5 mg via ORAL
  Filled 2020-12-13 (×2): qty 1

## 2020-12-13 MED ORDER — PIPERACILLIN-TAZOBACTAM 3.375 G IVPB
3.3750 g | Freq: Three times a day (TID) | INTRAVENOUS | Status: DC
  Administered 2020-12-14 – 2020-12-15 (×5): 3.375 g via INTRAVENOUS
  Filled 2020-12-13 (×5): qty 50

## 2020-12-13 MED ORDER — SODIUM CHLORIDE 0.9 % IV SOLN: INTRAVENOUS | Status: DC

## 2020-12-13 MED ORDER — SODIUM CHLORIDE 0.9 % IV BOLUS
1000.0000 mL | Freq: Once | INTRAVENOUS | Status: AC
  Administered 2020-12-13: 1000 mL via INTRAVENOUS

## 2020-12-13 MED ORDER — MORPHINE SULFATE (PF) 4 MG/ML IV SOLN
4.0000 mg | Freq: Once | INTRAVENOUS | Status: AC
  Administered 2020-12-13: 4 mg via INTRAVENOUS
  Filled 2020-12-13: qty 1

## 2020-12-13 MED ORDER — MORPHINE SULFATE (PF) 2 MG/ML IV SOLN
1.0000 mg | INTRAVENOUS | Status: DC | PRN
  Administered 2020-12-13: 2 mg via INTRAVENOUS
  Filled 2020-12-13: qty 1

## 2020-12-13 MED ORDER — GABAPENTIN 400 MG PO CAPS
800.0000 mg | ORAL_CAPSULE | Freq: Four times a day (QID) | ORAL | Status: DC
  Administered 2020-12-13 – 2020-12-15 (×6): 800 mg via ORAL
  Filled 2020-12-13 (×7): qty 2

## 2020-12-13 MED ORDER — CITALOPRAM HYDROBROMIDE 20 MG PO TABS
20.0000 mg | ORAL_TABLET | Freq: Every day | ORAL | Status: DC
  Administered 2020-12-14 – 2020-12-15 (×2): 20 mg via ORAL
  Filled 2020-12-13 (×2): qty 1

## 2020-12-13 MED ORDER — DIPHENHYDRAMINE HCL 25 MG PO CAPS: 25.0000 mg | ORAL_CAPSULE | Freq: Every evening | ORAL | Status: DC | PRN

## 2020-12-13 MED ORDER — BUSPIRONE HCL 5 MG PO TABS
15.0000 mg | ORAL_TABLET | Freq: Two times a day (BID) | ORAL | Status: DC
  Administered 2020-12-13 – 2020-12-15 (×4): 15 mg via ORAL
  Filled 2020-12-13 (×4): qty 3

## 2020-12-13 MED ORDER — AMITRIPTYLINE HCL 25 MG PO TABS
100.0000 mg | ORAL_TABLET | Freq: Every day | ORAL | Status: DC
  Administered 2020-12-13 – 2020-12-14 (×2): 100 mg via ORAL
  Filled 2020-12-13 (×3): qty 4

## 2020-12-13 MED ORDER — ACETAMINOPHEN 650 MG RE SUPP: 650.0000 mg | Freq: Four times a day (QID) | RECTAL | Status: DC | PRN

## 2020-12-13 MED ORDER — OXYCODONE HCL 5 MG PO TABS
5.0000 mg | ORAL_TABLET | ORAL | Status: DC | PRN
  Administered 2020-12-14: 5 mg via ORAL
  Filled 2020-12-13: qty 1

## 2020-12-13 MED ORDER — DIAZEPAM 5 MG PO TABS
10.0000 mg | ORAL_TABLET | Freq: Four times a day (QID) | ORAL | Status: DC | PRN
  Administered 2020-12-13: 10 mg via ORAL
  Filled 2020-12-13: qty 2

## 2020-12-13 MED ORDER — TOPIRAMATE 100 MG PO TABS
200.0000 mg | ORAL_TABLET | Freq: Two times a day (BID) | ORAL | Status: DC
  Administered 2020-12-13 – 2020-12-15 (×4): 200 mg via ORAL
  Filled 2020-12-13 (×4): qty 2

## 2020-12-13 MED ORDER — ATORVASTATIN CALCIUM 40 MG PO TABS
40.0000 mg | ORAL_TABLET | Freq: Every day | ORAL | Status: DC
  Administered 2020-12-13 – 2020-12-15 (×3): 40 mg via ORAL
  Filled 2020-12-13 (×3): qty 1

## 2020-12-13 MED ORDER — MORPHINE SULFATE (PF) 2 MG/ML IV SOLN
INTRAVENOUS | Status: AC
  Administered 2020-12-13: 2 mg via INTRAVENOUS
  Filled 2020-12-13: qty 1

## 2020-12-13 MED ORDER — FAMOTIDINE 20 MG PO TABS
20.0000 mg | ORAL_TABLET | Freq: Two times a day (BID) | ORAL | Status: DC
  Administered 2020-12-13 – 2020-12-15 (×4): 20 mg via ORAL
  Filled 2020-12-13 (×4): qty 1

## 2020-12-13 MED ORDER — ENOXAPARIN SODIUM 40 MG/0.4ML ~~LOC~~ SOLN
40.0000 mg | SUBCUTANEOUS | Status: DC
  Administered 2020-12-14: 40 mg via SUBCUTANEOUS
  Filled 2020-12-13 (×2): qty 0.4

## 2020-12-13 NOTE — Progress Notes (Signed)
Pharmacy Antibiotic Note  Brian [name] is a 65 y.o. male admitted on 12/13/2020 with intra-abdominal infection.  Pharmacy has been consulted for Zosyn dosing.  Plan: Zosyn 3.375g IV q8h (4 hour infusion).  Monitor labs, c/s, and patient improvement.  Height: 6' (182.9 cm) Weight: 89.4 kg (197 lb) IBW/kg (Calculated) : 77.6  Temp (24hrs), Avg:98.2 F (36.8 C), Min:98.2 F (36.8 C), Max:98.2 F (36.8 C)  Recent Labs  Lab 12/13/20 1541  WBC 9.2  CREATININE 1.32*    Estimated Creatinine Clearance: 62.1 mL/min (A) (by C-G formula based on SCr of 1.32 mg/dL (H)).    Allergies  Allergen Reactions  . Ketorolac Tromethamine Other (See Comments)    Renal failure  . Doxycycline Rash  . Sulfamethoxazole Rash  . Tetracyclines & Related Rash    Antimicrobials this admission: Zosyn 4/15 >>      Microbiology results: None pending  Thank you for allowing pharmacy to be a part of this patient's care.  Ramond Craver 12/13/2020 6:44 PM

## 2020-12-13 NOTE — ED Provider Notes (Signed)
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 improvement if sxs over the next 4-6 hours post ingestion.  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 Bun slightly elevated. lfts wnl Lipase neg Tox screen neg  CT abd/pelvis -   1. Diverticulosis. Possible contained perforation adjacent to a prominent diverticulum in the mid sigmoid colon associated minimal inflammatory change. 2. Status post cholecystectomy. 3. Bilateral renal cysts. 4. Enlarged, partially calcified prostate gland. 5. Aortic  Atherosclerosis   5:39 PM CONSULT with Dr. Arnoldo Morale with general surgery. He states that pt can either be given 10 day course of cipro/flagyl or  admitted for IV abx, pain control and serial abd exams  Pt given ivf, pain meds, abx. We discussed results and options of admission versus discharge. Pt does not feel comfortable with plan for discharge home. We will discuss admission with hospitalist team.   6:30 PM CONSULT with Dr. Waldron [facility] who accepts patient for admission.   Final Clinical Impression(s) / ED Diagnoses Final diagnoses:  Gastrointestinal perforation [facility])    Rx / DC Orders ED Discharge Orders    None       Bishop Dublin 12/13/20 1837    Milton Ferguson, MD 12/17/20 [phone]

## 2020-12-13 NOTE — H&P (Signed)
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 (HCC)  Sigmoid diverticulosis with contained perforation -Associated with minimal inflammatory changes(early diverticulitis). -Physician discussed with general surgery Dr. Arnoldo Morale. -Patient will be admitted, for IV antibiotics, will start him on Zosyn ,(will discontinue Protonix and start on probiotics given history of C. Difficile) -We will keep on clear liquid diet, will keep on IV fluids,. -Continue with pain control with as needed oxycodone and morphine  Seizure disorder -Continue with home  medications  Diabetes -Appears to be controlled with diet, monitor closely  Chronic pain syndrome/neuropathic pain of left forearm -Continue with gabapentin, Vicodin on hold as he is on oxycodone and morphine   Hyperlipidemia -continue with statin  Hypertension -Pressure acceptable, continue with Norvasc, will hold losartan  GERD -Hold PPI given he is on antibiotic with known history of C. difficile, will keep on TDN .  DVT Prophylaxis  Lovenox  AM Labs Ordered, also please review Full Orders  Family Communication: Admission, patients condition and plan of care including tests being ordered have been discussed with the patient  who indicate understanding and agree with the plan and Code Status.  Code Status Full  Likely DC to  home  Condition GUARDED    Consults called: general surgery by ED    Admission status: inpatient    Time spent in minutes : 60 minutes   Phillips Climes M.D on 12/13/2020 at 6:40 PM   [facility] - Office  [phone]
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
## 2020-12-14 LAB — RAPID URINE DRUG SCREEN, HOSP PERFORMED
Amphetamines: NOT DETECTED
Barbiturates: NOT DETECTED
Benzodiazepines: POSITIVE — AB
Cocaine: NOT DETECTED
Opiates: NOT DETECTED
Tetrahydrocannabinol: POSITIVE — AB

## 2020-12-14 LAB — URINALYSIS, ROUTINE W REFLEX MICROSCOPIC
Bilirubin Urine: NEGATIVE
Glucose, UA: NEGATIVE mg/dL
Hgb urine dipstick: NEGATIVE
Ketones, ur: NEGATIVE mg/dL
Leukocytes,Ua: NEGATIVE
Nitrite: NEGATIVE
Protein, ur: NEGATIVE mg/dL
Specific Gravity, Urine: 1.004 — ABNORMAL LOW (ref 1.005–1.030)
pH: 7 (ref 5.0–8.0)

## 2020-12-14 LAB — CBC
HCT: 40 % (ref 39.0–52.0)
Hemoglobin: 12.7 g/dL — ABNORMAL LOW (ref 13.0–17.0)
MCH: 32.5 pg (ref 26.0–34.0)
MCHC: 31.8 g/dL (ref 30.0–36.0)
MCV: 102.3 fL — ABNORMAL HIGH (ref 80.0–100.0)
Platelets: 157 10*3/uL (ref 150–400)
RBC: 3.91 MIL/uL — ABNORMAL LOW (ref 4.22–5.81)
RDW: 13.3 % (ref 11.5–15.5)
WBC: 7.7 10*3/uL (ref 4.0–10.5)
nRBC: 0 % (ref 0.0–0.2)

## 2020-12-14 LAB — COMPREHENSIVE METABOLIC PANEL
ALT: 138 U/L — ABNORMAL HIGH (ref 0–44)
AST: 151 U/L — ABNORMAL HIGH (ref 15–41)
Albumin: 3.6 g/dL (ref 3.5–5.0)
Alkaline Phosphatase: 97 U/L (ref 38–126)
Anion gap: 7 (ref 5–15)
BUN: 20 mg/dL (ref 8–23)
CO2: 25 mmol/L (ref 22–32)
Calcium: 8.4 mg/dL — ABNORMAL LOW (ref 8.9–10.3)
Chloride: 111 mmol/L (ref 98–111)
Creatinine, Ser: 1.41 mg/dL — ABNORMAL HIGH (ref 0.61–1.24)
GFR, Estimated: 56 mL/min — ABNORMAL LOW (ref >60)
Glucose, Bld: 82 mg/dL (ref 70–99)
Potassium: 4.3 mmol/L (ref 3.5–5.1)
Sodium: 143 mmol/L (ref 135–145)
Total Bilirubin: 0.8 mg/dL (ref 0.3–1.2)
Total Protein: 6.2 g/dL — ABNORMAL LOW (ref 6.5–8.1)

## 2020-12-14 LAB — SARS CORONAVIRUS 2 (TAT 6-24 HRS): SARS Coronavirus 2: NEGATIVE

## 2020-12-14 MED ORDER — BACID PO TABS: 2.0000 | ORAL_TABLET | Freq: Three times a day (TID) | ORAL | Status: DC

## 2020-12-14 MED ORDER — RISAQUAD PO CAPS: 2.0000 | ORAL_CAPSULE | Freq: Every day | ORAL | Status: DC

## 2020-12-14 MED ORDER — RISAQUAD PO CAPS
1.0000 | ORAL_CAPSULE | Freq: Two times a day (BID) | ORAL | Status: DC
  Administered 2020-12-14 – 2020-12-15 (×3): 1 via ORAL
  Filled 2020-12-14 (×3): qty 1
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
## 2020-12-15 DIAGNOSIS — K5732 Diverticulitis of large intestine without perforation or abscess without bleeding: Secondary | ICD-10-CM

## 2020-12-15 DIAGNOSIS — K219 Gastro-esophageal reflux disease without esophagitis: Secondary | ICD-10-CM

## 2020-12-15 LAB — BASIC METABOLIC PANEL
Anion gap: 7 (ref 5–15)
BUN: 16 mg/dL (ref 8–23)
CO2: 23 mmol/L (ref 22–32)
Calcium: 8.8 mg/dL — ABNORMAL LOW (ref 8.9–10.3)
Chloride: 112 mmol/L — ABNORMAL HIGH (ref 98–111)
Creatinine, Ser: 1.45 mg/dL — ABNORMAL HIGH (ref 0.61–1.24)
GFR, Estimated: 54 mL/min — ABNORMAL LOW (ref >60)
Glucose, Bld: 89 mg/dL (ref 70–99)
Potassium: 4.4 mmol/L (ref 3.5–5.1)
Sodium: 142 mmol/L (ref 135–145)

## 2020-12-15 LAB — CBC
HCT: 40.9 % (ref 39.0–52.0)
Hemoglobin: 13.4 g/dL (ref 13.0–17.0)
MCH: 33.1 pg (ref 26.0–34.0)
MCHC: 32.8 g/dL (ref 30.0–36.0)
MCV: 101 fL — ABNORMAL HIGH (ref 80.0–100.0)
Platelets: 172 10*3/uL (ref 150–400)
RBC: 4.05 MIL/uL — ABNORMAL LOW (ref 4.22–5.81)
RDW: 13.1 % (ref 11.5–15.5)
WBC: 8.7 10*3/uL (ref 4.0–10.5)
nRBC: 0 % (ref 0.0–0.2)

## 2020-12-15 MED ORDER — AMOXICILLIN-POT CLAVULANATE 875-125 MG PO TABS: 1.0000 | ORAL_TABLET | Freq: Two times a day (BID) | ORAL | 0 refills | Status: AC

## 2020-12-15 MED ORDER — PANTOPRAZOLE SODIUM 40 MG PO TBEC
40.0000 mg | DELAYED_RELEASE_TABLET | Freq: Two times a day (BID) | ORAL | Status: DC
Start: 2020-12-15 — End: 2021-05-22
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
## 2020-12-15 NOTE — Discharge Instructions (Signed)
Diverticulitis  Diverticulitis is infection or inflammation of small pouches (diverticula) in the colon that form due to a condition called diverticulosis. Diverticula can trap stool (feces) and bacteria, causing infection and inflammation. Diverticulitis may cause severe stomach pain and diarrhea. It may lead to tissue damage in the colon that causes bleeding or blockage. The diverticula may also burst (rupture) and cause infected stool to enter other areas of the abdomen. What are the causes? This condition is caused by stool becoming trapped in the diverticula, which allows bacteria to grow in the diverticula. This leads to inflammation and infection. What increases the risk? You are more likely to develop this condition if you have diverticulosis. The risk increases if you:  Are overweight or obese.  Do not get enough exercise.  Drink alcohol.  Use tobacco products.  Eat a diet that has a lot of red meat such as beef, pork, or lamb.  Eat a diet that does not include enough fiber. High-fiber foods include fruits, vegetables, beans, nuts, and whole grains.  Are over 40 years of age. What are the signs or symptoms? Symptoms of this condition may include:  Pain and tenderness in the abdomen. The pain is normally located on the left side of the abdomen, but it may occur in other areas.  Fever and chills.  Nausea.  Vomiting.  Cramping.  Bloating.  Changes in bowel routines.  Blood in your stool. How is this diagnosed? This condition is diagnosed based on:  Your medical history.  A physical exam.  Tests to make sure there is nothing else causing your condition. These tests may include: ? Blood tests. ? Urine tests. ? CT scan of the abdomen. How is this treated? Most cases of this condition are mild and can be treated at home. Treatment may include:  Taking over-the-counter pain medicines.  Following a clear liquid diet.  Taking antibiotic medicines by  mouth.  Resting. More severe cases may need to be treated at a hospital. Treatment may include:  Not eating or drinking.  Taking prescription pain medicine.  Receiving antibiotic medicines through an IV.  Receiving fluids and nutrition through an IV.  Surgery. When your condition is under control, your health care provider may recommend that you have a colonoscopy. This is an exam to look at the entire large intestine. During the exam, a lubricated, bendable tube is inserted into the anus and then passed into the rectum, colon, and other parts of the large intestine. A colonoscopy can show how severe your diverticula are and whether something else may be causing your symptoms. Follow these instructions at home: Medicines  Take over-the-counter and prescription medicines only as told by your health care provider. These include fiber supplements, probiotics, and stool softeners.  If you were prescribed an antibiotic medicine, take it as told by your health care provider. Do not stop taking the antibiotic even if you start to feel better.  Ask your health care provider if the medicine prescribed to you requires you to avoid driving or using machinery. Eating and drinking  Follow a full liquid diet or another diet as directed by your health care provider.  After your symptoms improve, your health care provider may tell you to change your diet. He or she may recommend that you eat a diet that contains at least 25 grams (25 g) of fiber daily. Fiber makes it easier to pass stool. Healthy sources of fiber include: ? Berries. One cup contains 4-8 grams of fiber. ? Beans   or lentils. One-half cup contains 5-8 grams of fiber. ? Green vegetables. One cup contains 4 grams of fiber.  Avoid eating red meat.   General instructions  Do not use any products that contain nicotine or tobacco, such as cigarettes, e-cigarettes, and chewing tobacco. If you need help quitting, ask your health care  provider.  Exercise for at least 30 minutes, 3 times each week. You should exercise hard enough to raise your heart rate and break a sweat.  Keep all follow-up visits as told by your health care provider. This is important. You may need to have a colonoscopy. Contact a health care provider if:  Your pain does not improve.  Your bowel movements do not return to normal. Get help right away if:  Your pain gets worse.  Your symptoms do not get better with treatment.  Your symptoms suddenly get worse.  You have a fever.  You vomit more than one time.  You have stools that are bloody, black, or tarry. Summary  Diverticulitis is infection or inflammation of small pouches (diverticula) in the colon that form due to a condition called diverticulosis. Diverticula can trap stool (feces) and bacteria, causing infection and inflammation.  You are at higher risk for this condition if you have diverticulosis and you eat a diet that does not include enough fiber.  Most cases of this condition are mild and can be treated at home. More severe cases may need to be treated at a hospital.  When your condition is under control, your health care provider may recommend that you have an exam called a colonoscopy. This exam can show how severe your diverticula are and whether something else may be causing your symptoms.  Keep all follow-up visits as told by your health care provider. This is important. This information is not intended to replace advice given to you by your health care provider. Make sure you discuss any questions you have with your health care provider. Document Revised: 05/29/2019 Document Reviewed: 05/29/2019 [employer] Patient Education  2021 [employer]  
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
## 2020-12-23 ENCOUNTER — Other Ambulatory Visit: Payer: Self-pay

## 2020-12-23 ENCOUNTER — Ambulatory Visit (INDEPENDENT_AMBULATORY_CARE_PROVIDER_SITE_OTHER): Payer: Medicare Other | Admitting: Gastroenterology

## 2020-12-23 ENCOUNTER — Encounter (INDEPENDENT_AMBULATORY_CARE_PROVIDER_SITE_OTHER): Payer: Self-pay | Admitting: Gastroenterology

## 2020-12-23 VITALS — BP 124/67 | HR 63 | Temp 98.4°F | Ht 72.0 in | Wt 197.0 lb

## 2020-12-23 DIAGNOSIS — Z79891 Long term (current) use of opiate analgesic: Secondary | ICD-10-CM | POA: Insufficient documentation

## 2020-12-23 DIAGNOSIS — G8929 Other chronic pain: Secondary | ICD-10-CM

## 2020-12-23 DIAGNOSIS — R109 Unspecified abdominal pain: Secondary | ICD-10-CM | POA: Diagnosis not present

## 2020-12-23 MED ORDER — HYOSCYAMINE SULFATE 0.125 MG SL SUBL: 0.1250 mg | SUBLINGUAL_TABLET | SUBLINGUAL | 0 refills | Status: DC | PRN
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
## 2020-12-23 NOTE — Patient Instructions (Addendum)
Start Levsin 1 tablet every 4 hours as needed Try to abstain taking opiates unless you're not having any improvement with Levsin Follow up with pain specialist

## 2020-12-24 ENCOUNTER — Encounter: Payer: Self-pay | Admitting: General Surgery

## 2020-12-24 ENCOUNTER — Ambulatory Visit (INDEPENDENT_AMBULATORY_CARE_PROVIDER_SITE_OTHER): Payer: Medicare Other | Admitting: General Surgery

## 2020-12-24 VITALS — BP 148/75 | HR 63 | Temp 98.9°F | Resp 14 | Ht 72.0 in | Wt 193.0 lb

## 2020-12-24 DIAGNOSIS — K5732 Diverticulitis of large intestine without perforation or abscess without bleeding: Secondary | ICD-10-CM | POA: Diagnosis not present

## 2020-12-24 NOTE — Progress Notes (Signed)
Subjective:     Brian [name]  Here for follow-up after hospitalization for sigmoid diverticulitis with small contained microperforation.  Patient has seen gastroenterology since his discharge.  He states his lower abdominal pain has decreased.  He is about to finish his antibiotic regimen.  He is more concerned about his chronic opioid use due to his back issues.  He states his bowel movements are within normal limits.  He does not want surgical invention unless absolutely necessary. Objective:    BP (!) 148/75   Pulse 63   Temp 98.9 F (37.2 C) (Other (Comment))   Resp 14   Ht 6' (1.829 m)   Wt 193 lb (87.5 kg)   SpO2 95%   BMI 26.18 kg/m   General:  alert, cooperative and no distress  Abdomen is soft with minimal tenderness in the left lower quadrant region.  No rigidity is noted. Previous hospitalization notes reviewed     Assessment:    Sigmoid diverticulitis with perforation, resolving.  No need for surgical intervention at this time.    Plan:   Literature was given concerning diverticulosis and diverticulitis.  He will be following up with a spine surgeon concerning his back issues.  Follow-up here as needed.

## 2020-12-24 NOTE — Patient Instructions (Signed)
Diverticulitis  Diverticulitis is infection or inflammation of small pouches (diverticula) in the colon that form due to a condition called diverticulosis. Diverticula can trap stool (feces) and bacteria, causing infection and inflammation. Diverticulitis may cause severe stomach pain and diarrhea. It may lead to tissue damage in the colon that causes bleeding or blockage. The diverticula may also burst (rupture) and cause infected stool to enter other areas of the abdomen. What are the causes? This condition is caused by stool becoming trapped in the diverticula, which allows bacteria to grow in the diverticula. This leads to inflammation and infection. What increases the risk? You are more likely to develop this condition if you have diverticulosis. The risk increases if you:  Are overweight or obese.  Do not get enough exercise.  Drink alcohol.  Use tobacco products.  Eat a diet that has a lot of red meat such as beef, pork, or lamb.  Eat a diet that does not include enough fiber. High-fiber foods include fruits, vegetables, beans, nuts, and whole grains.  Are over 40 years of age. What are the signs or symptoms? Symptoms of this condition may include:  Pain and tenderness in the abdomen. The pain is normally located on the left side of the abdomen, but it may occur in other areas.  Fever and chills.  Nausea.  Vomiting.  Cramping.  Bloating.  Changes in bowel routines.  Blood in your stool. How is this diagnosed? This condition is diagnosed based on:  Your medical history.  A physical exam.  Tests to make sure there is nothing else causing your condition. These tests may include: ? Blood tests. ? Urine tests. ? CT scan of the abdomen. How is this treated? Most cases of this condition are mild and can be treated at home. Treatment may include:  Taking over-the-counter pain medicines.  Following a clear liquid diet.  Taking antibiotic medicines by  mouth.  Resting. More severe cases may need to be treated at a hospital. Treatment may include:  Not eating or drinking.  Taking prescription pain medicine.  Receiving antibiotic medicines through an IV.  Receiving fluids and nutrition through an IV.  Surgery. When your condition is under control, your health care provider may recommend that you have a colonoscopy. This is an exam to look at the entire large intestine. During the exam, a lubricated, bendable tube is inserted into the anus and then passed into the rectum, colon, and other parts of the large intestine. A colonoscopy can show how severe your diverticula are and whether something else may be causing your symptoms. Follow these instructions at home: Medicines  Take over-the-counter and prescription medicines only as told by your health care provider. These include fiber supplements, probiotics, and stool softeners.  If you were prescribed an antibiotic medicine, take it as told by your health care provider. Do not stop taking the antibiotic even if you start to feel better.  Ask your health care provider if the medicine prescribed to you requires you to avoid driving or using machinery. Eating and drinking  Follow a full liquid diet or another diet as directed by your health care provider.  After your symptoms improve, your health care provider may tell you to change your diet. He or she may recommend that you eat a diet that contains at least 25 grams (25 g) of fiber daily. Fiber makes it easier to pass stool. Healthy sources of fiber include: ? Berries. One cup contains 4-8 grams of fiber. ? Beans   or lentils. One-half cup contains 5-8 grams of fiber. ? Green vegetables. One cup contains 4 grams of fiber.  Avoid eating red meat.   General instructions  Do not use any products that contain nicotine or tobacco, such as cigarettes, e-cigarettes, and chewing tobacco. If you need help quitting, ask your health care  provider.  Exercise for at least 30 minutes, 3 times each week. You should exercise hard enough to raise your heart rate and break a sweat.  Keep all follow-up visits as told by your health care provider. This is important. You may need to have a colonoscopy. Contact a health care provider if:  Your pain does not improve.  Your bowel movements do not return to normal. Get help right away if:  Your pain gets worse.  Your symptoms do not get better with treatment.  Your symptoms suddenly get worse.  You have a fever.  You vomit more than one time.  You have stools that are bloody, black, or tarry. Summary  Diverticulitis is infection or inflammation of small pouches (diverticula) in the colon that form due to a condition called diverticulosis. Diverticula can trap stool (feces) and bacteria, causing infection and inflammation.  You are at higher risk for this condition if you have diverticulosis and you eat a diet that does not include enough fiber.  Most cases of this condition are mild and can be treated at home. More severe cases may need to be treated at a hospital.  When your condition is under control, your health care provider may recommend that you have an exam called a colonoscopy. This exam can show how severe your diverticula are and whether something else may be causing your symptoms.  Keep all follow-up visits as told by your health care provider. This is important. This information is not intended to replace advice given to you by your health care provider. Make sure you discuss any questions you have with your health care provider. Document Revised: 05/29/2019 Document Reviewed: 05/29/2019 Elsevier Patient Education  2021 [employer]  

## 2021-01-03 IMAGING — DX DG THORACIC SPINE 3V
3 series · 3 of 3 positions shown · non-contrast
Comparison: 07/15/2017

CLINICAL DATA: Chronic back pain

EXAM:
THORACIC SPINE - 3 VIEWS

[t-spine ap]
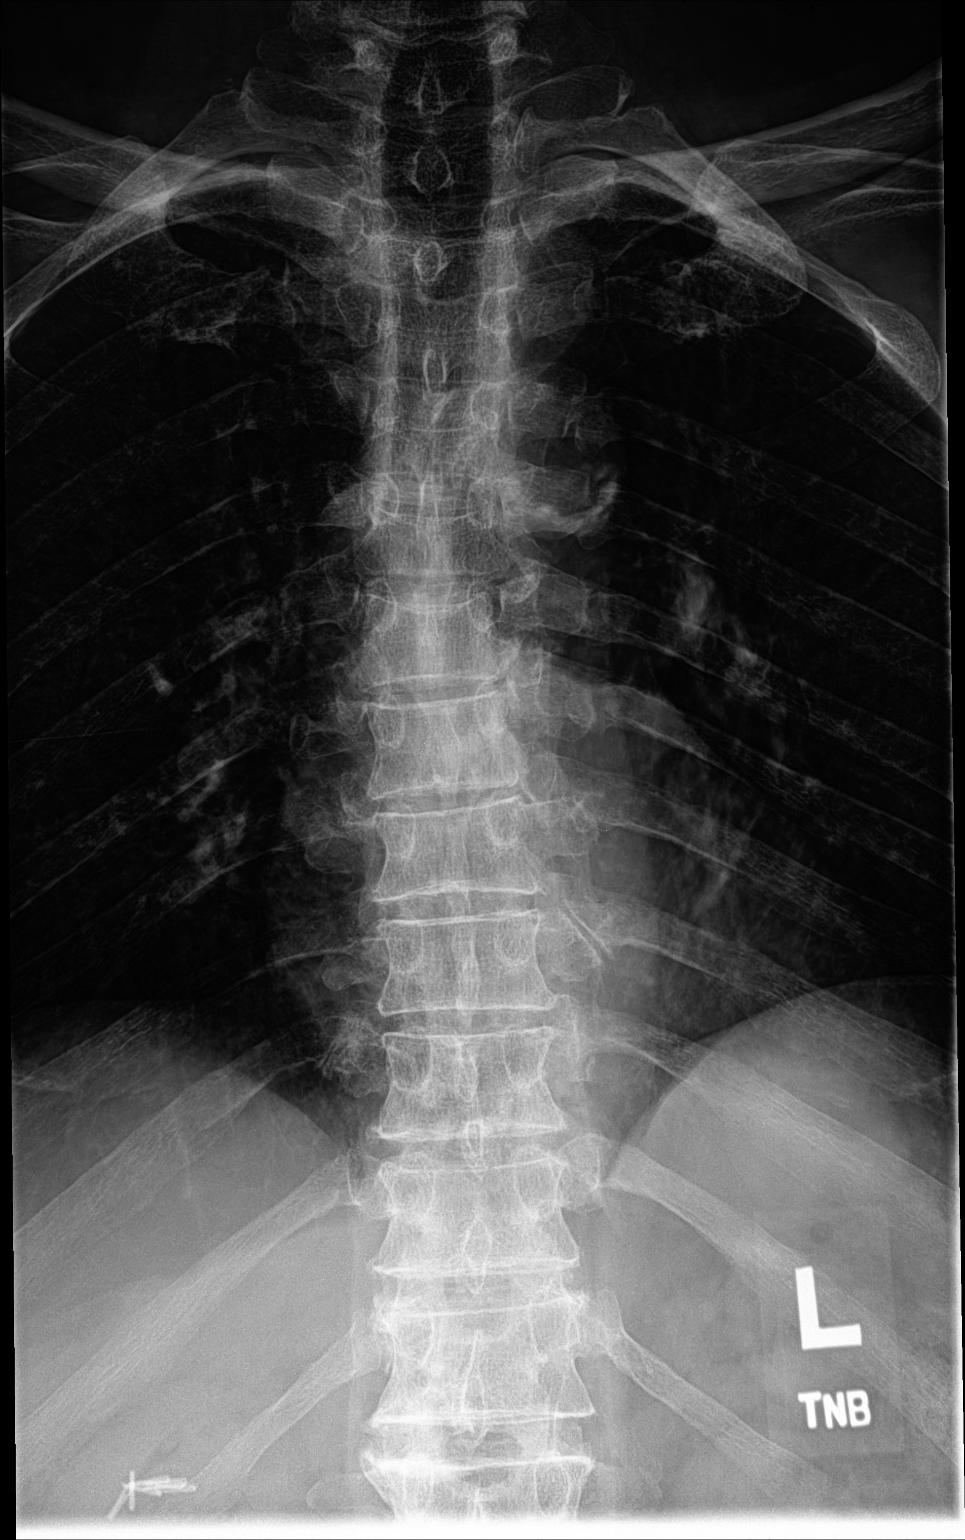

[t-spine lat]
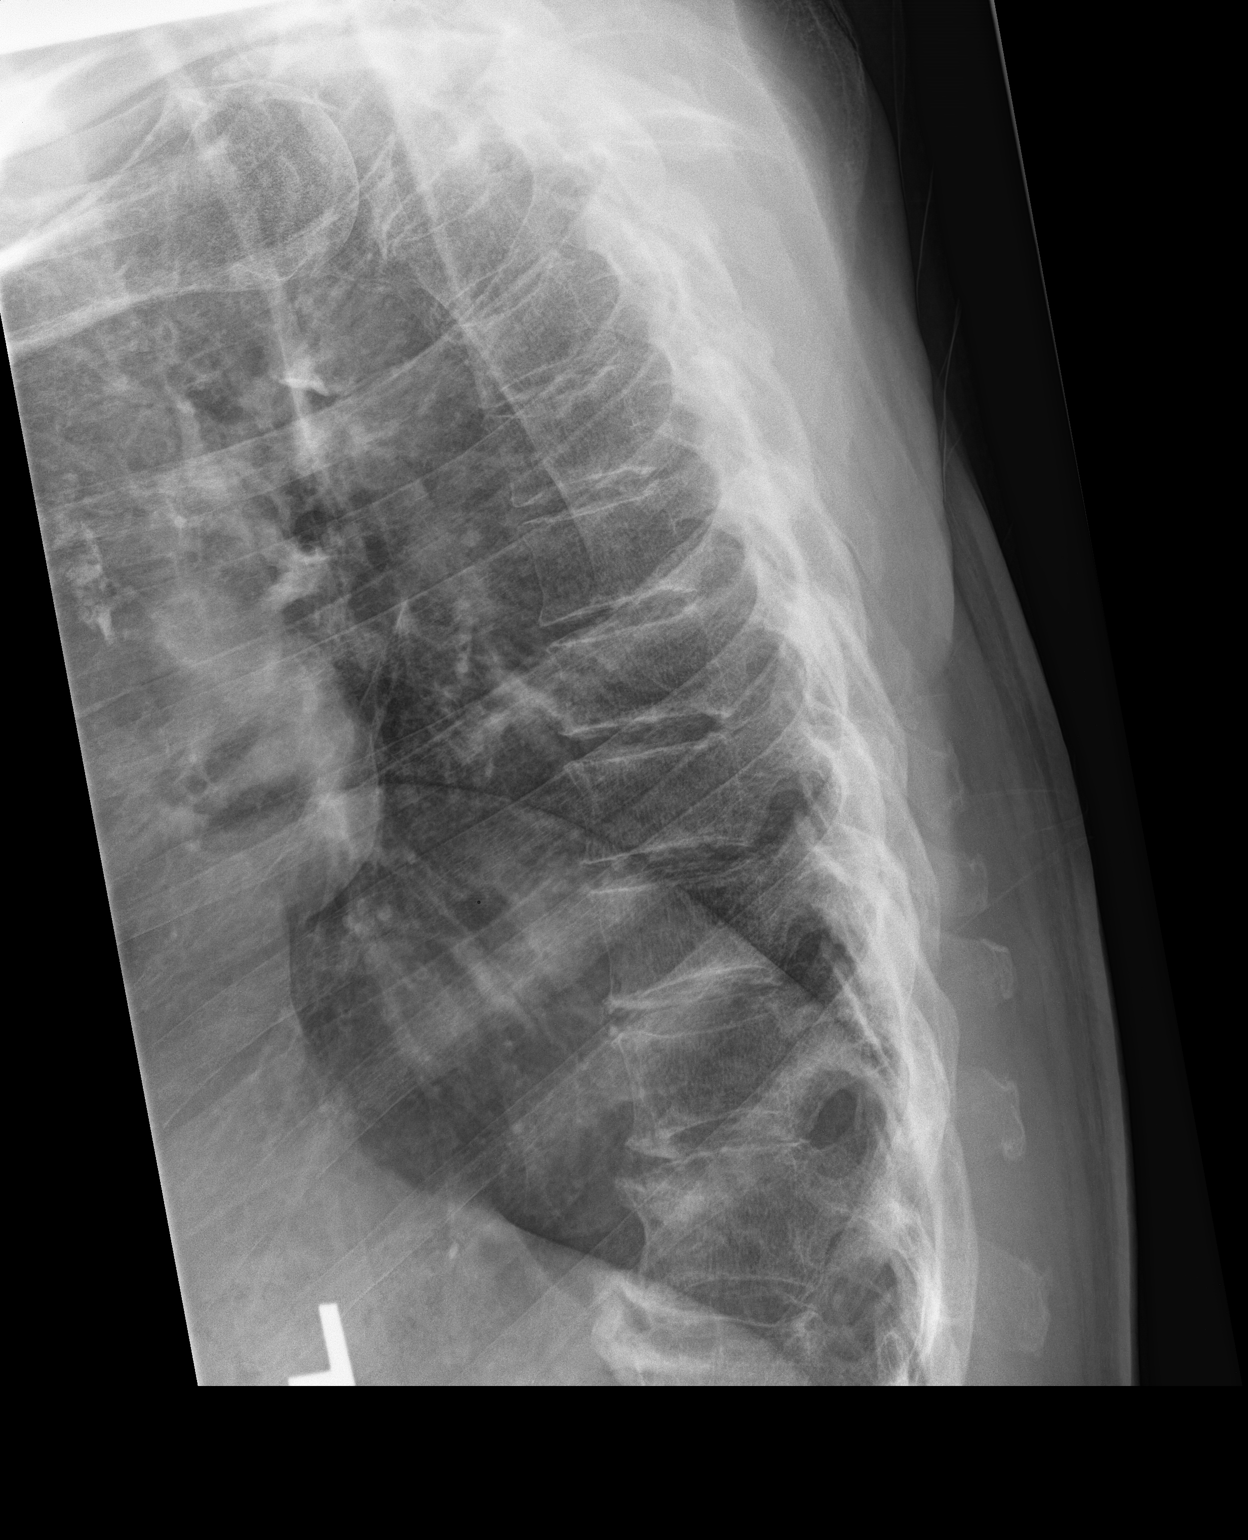

[t-spine swimmers]
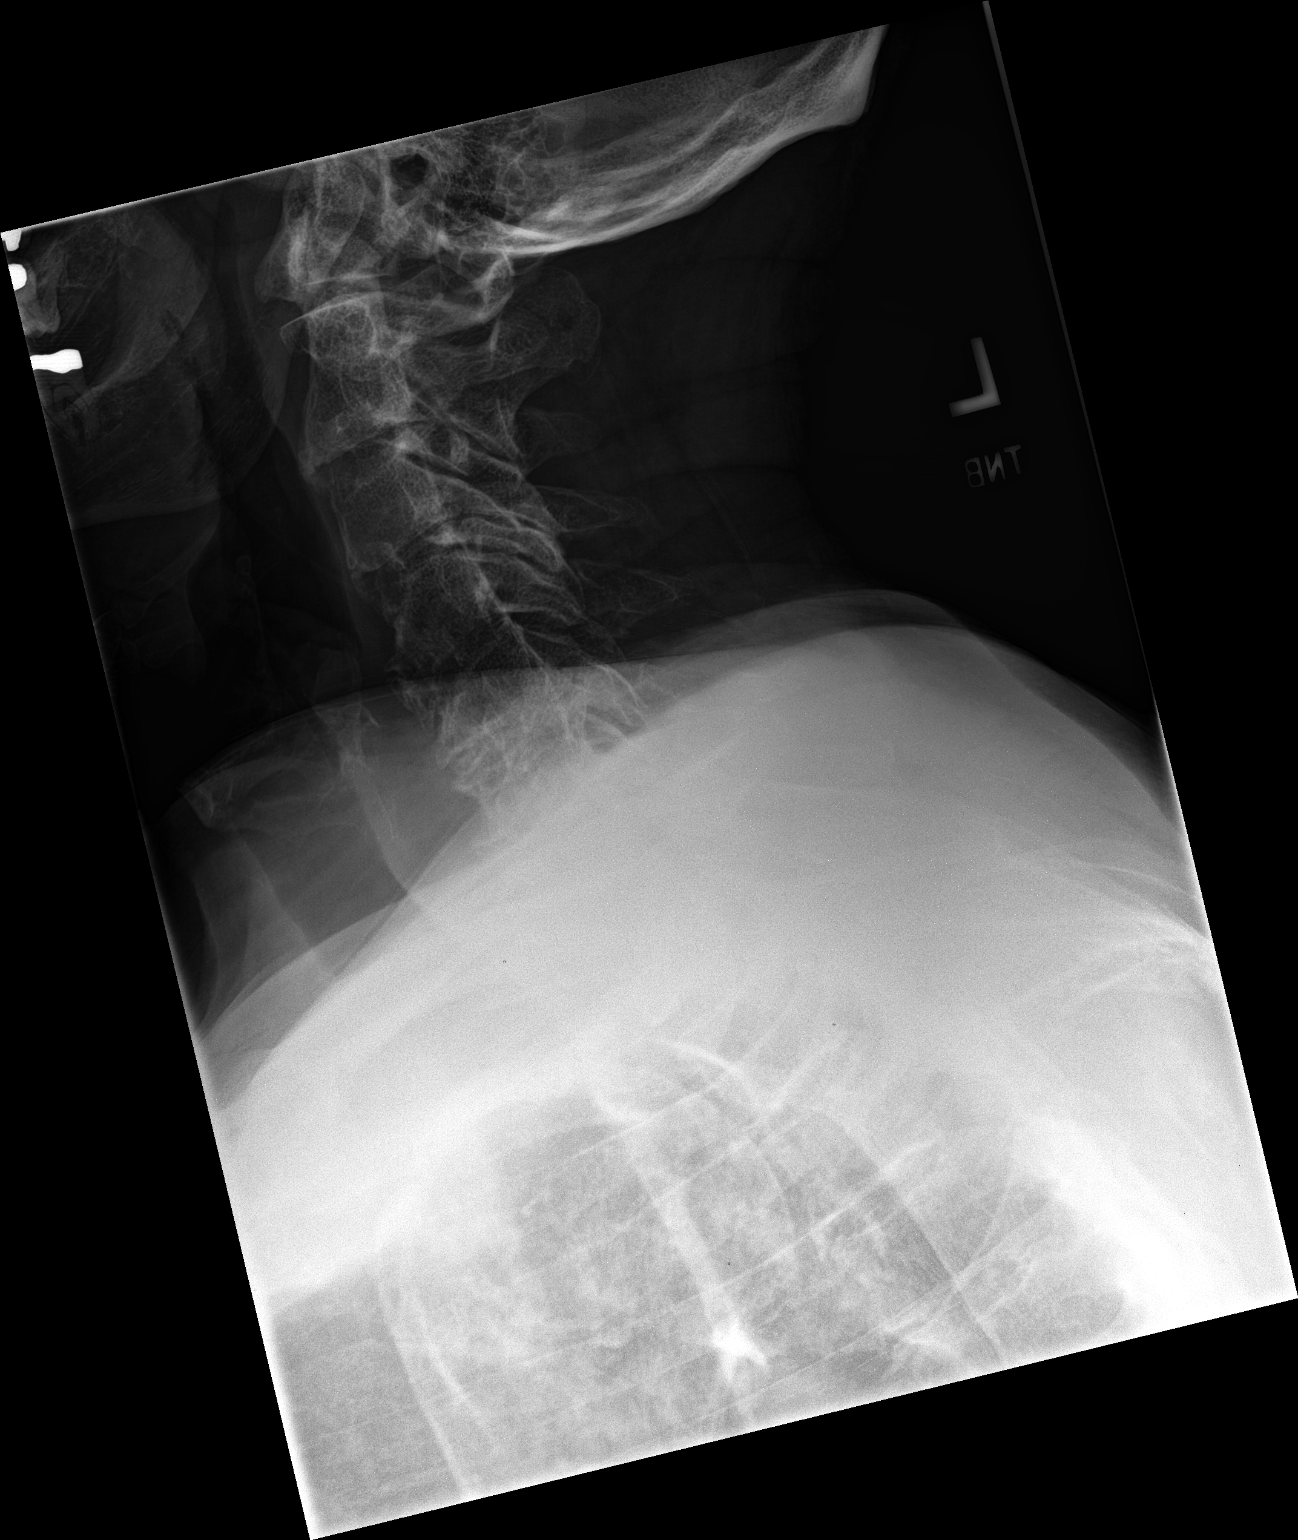

[3 of 3 positions shown; findings below may reference images not displayed]

FINDINGS: Chronic mild superior endplate compression deformity at T5,
unchanged. There is no evidence of acute thoracic spine fracture.
Alignment is normal without static listhesis. Degenerative endplate
spurring within the lower thoracic spine. No other significant bone
abnormalities are identified.
IMPRESSION: 1. No acute osseous abnormality of the thoracic spine.
2. Unchanged chronic mild superior endplate compression deformity at
T5.
3. Degenerative endplate spurring within the lower thoracic spine.

## 2021-01-03 IMAGING — DX DG LUMBAR SPINE COMPLETE 4+V
5 series · 5 of 5 positions shown · non-contrast
Comparison: 03/13/2019

CLINICAL DATA: Chronic back pain

EXAM:
LUMBAR SPINE - COMPLETE 4+ VIEW

[l-spine ap]
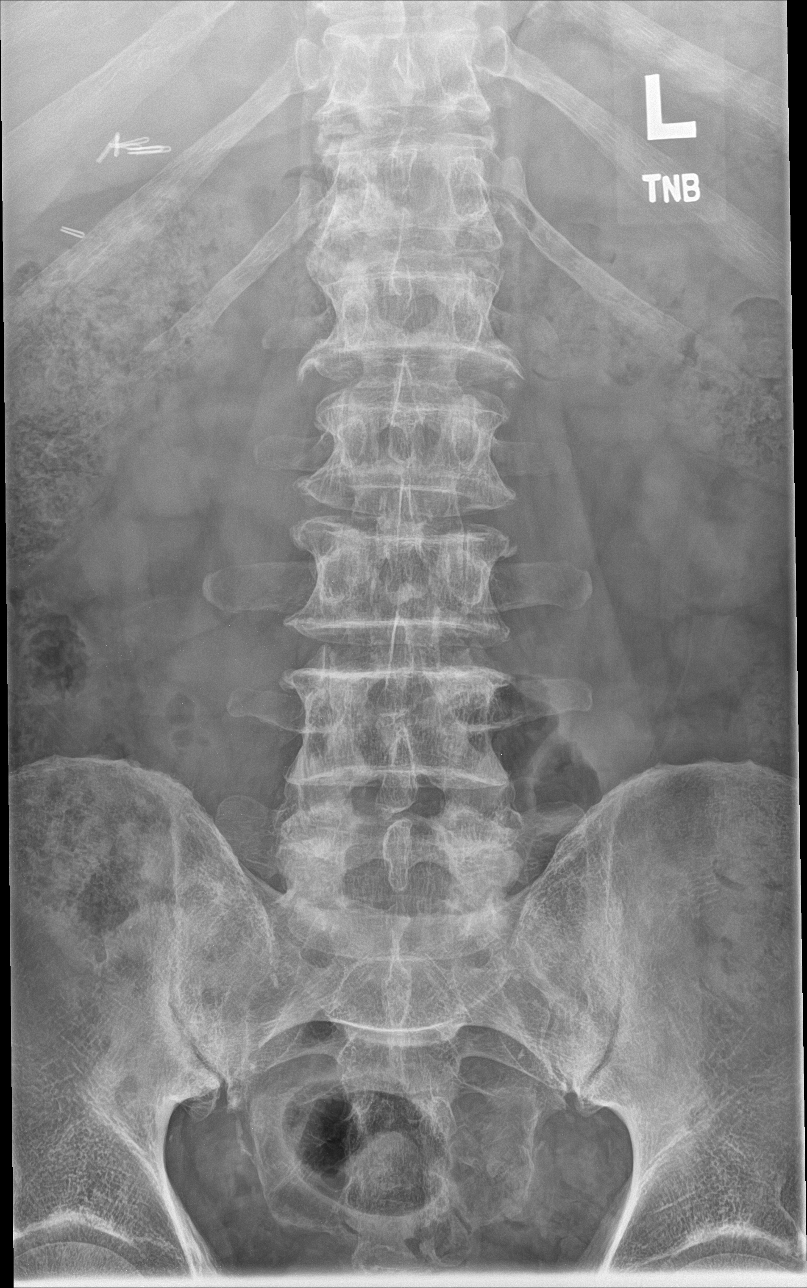

[l-spine obl (1 of 2)]
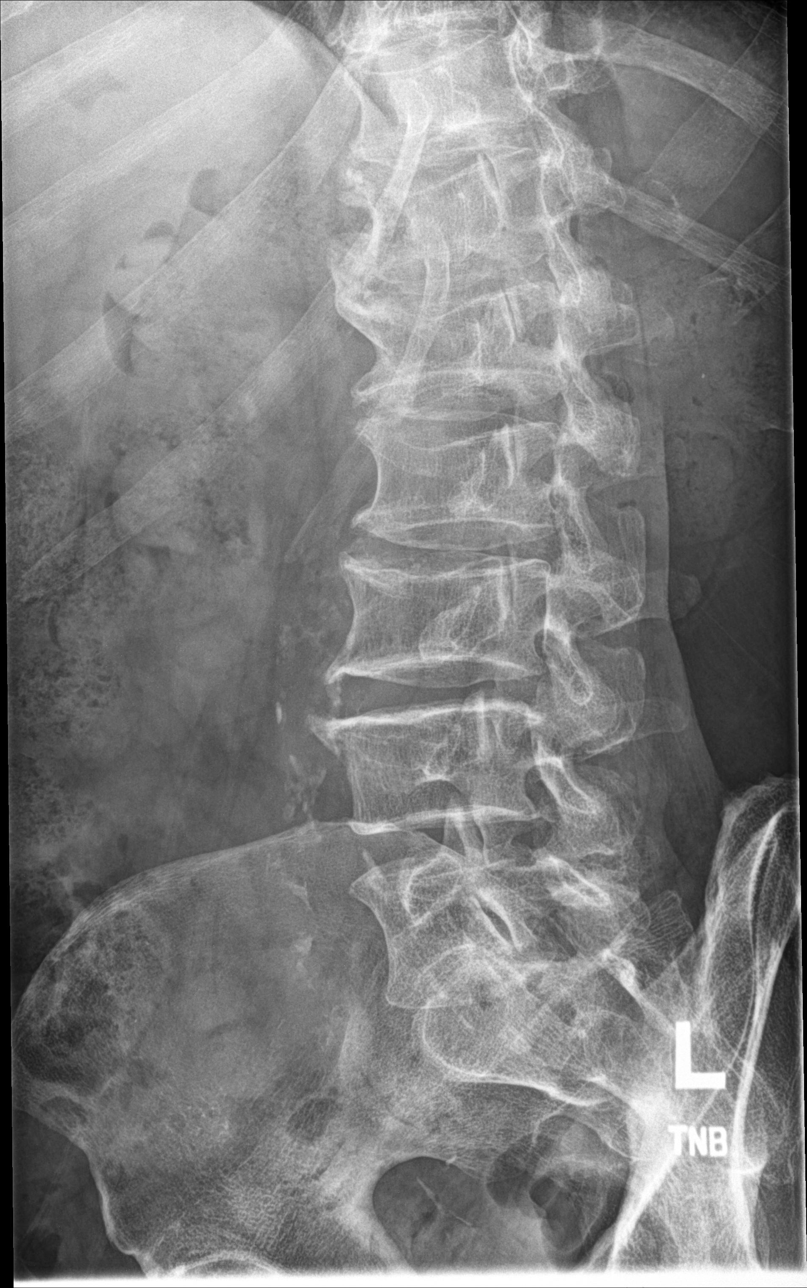

[l-spine obl (2 of 2)]
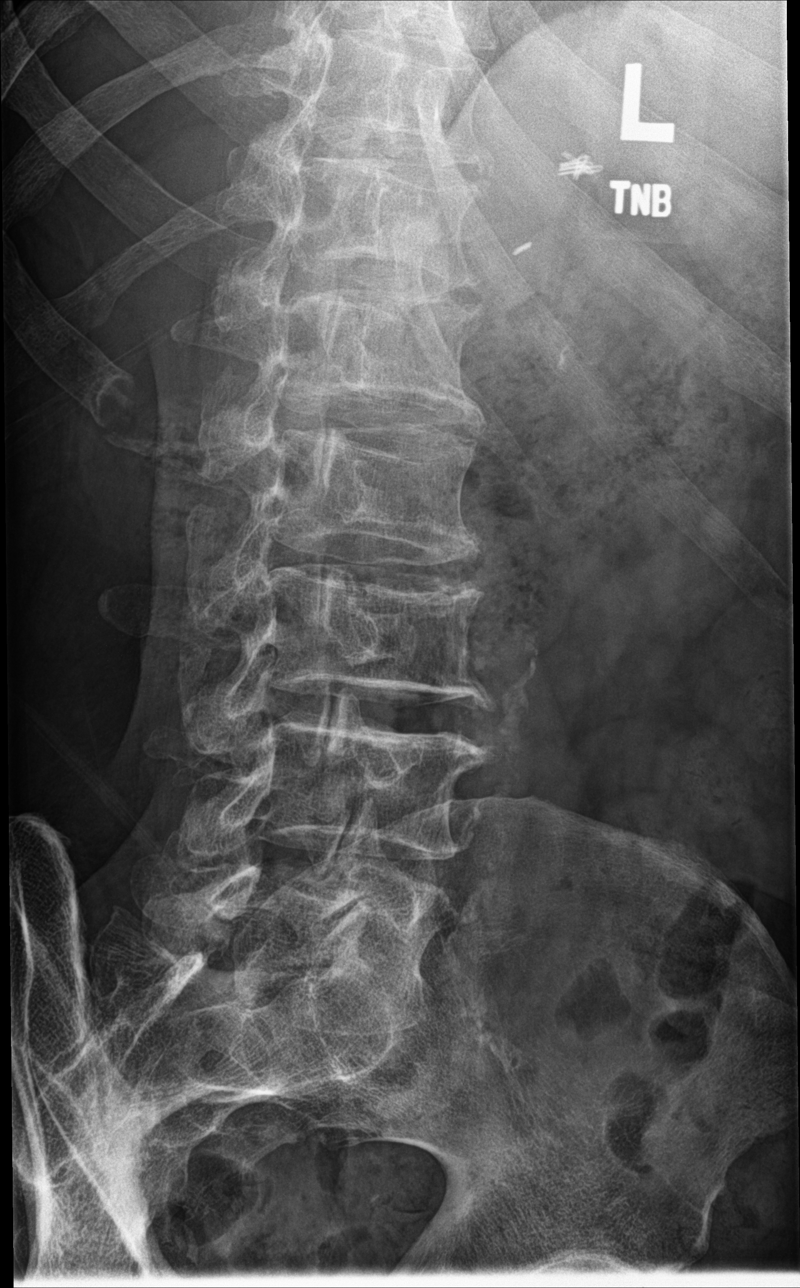

[l-spine lat]
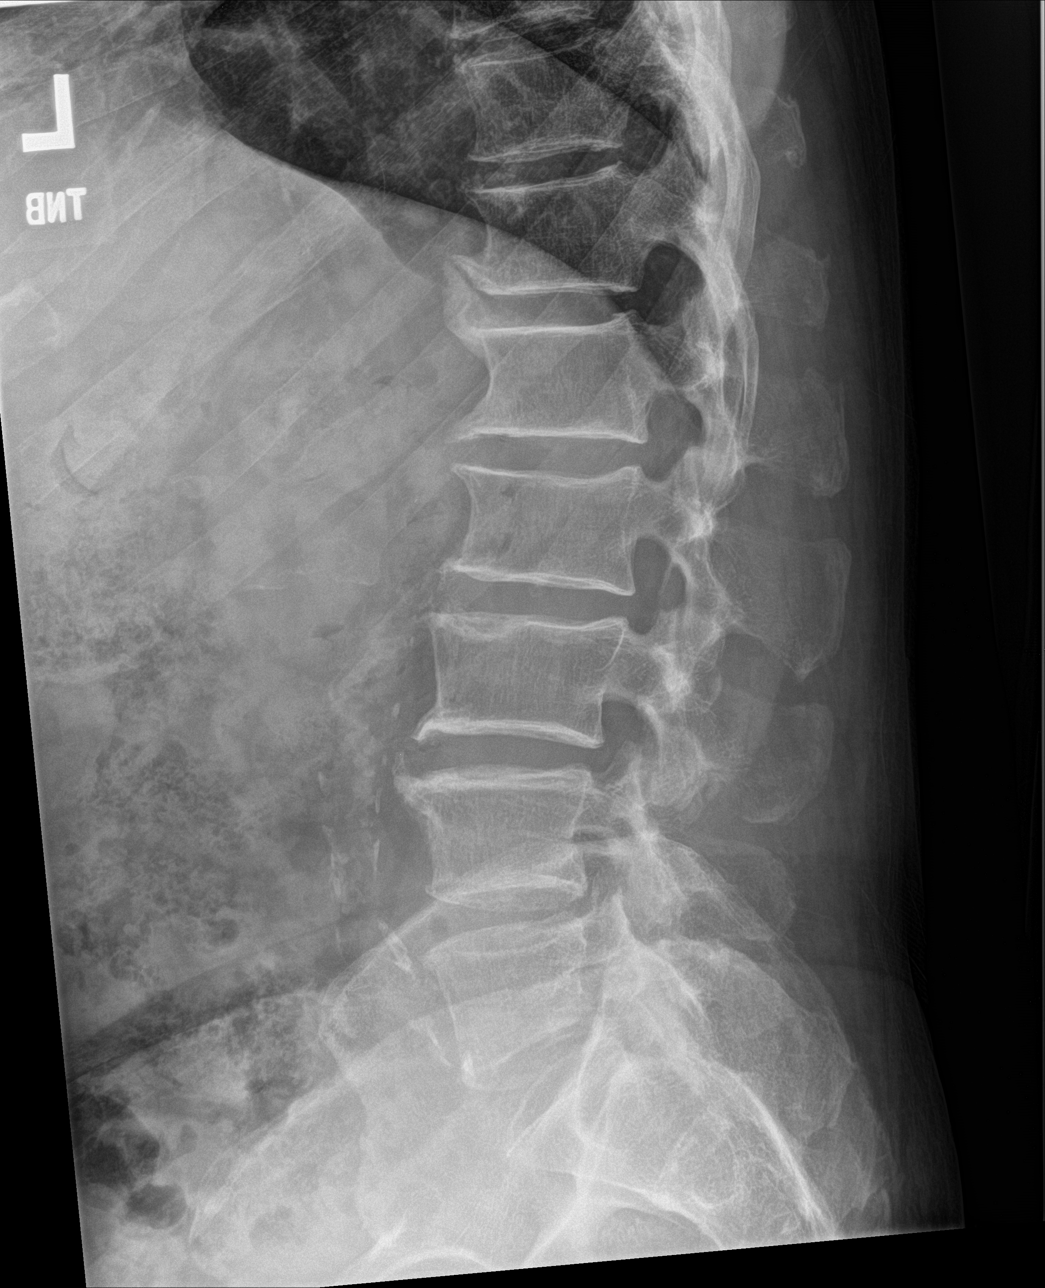

[l-spine spot]
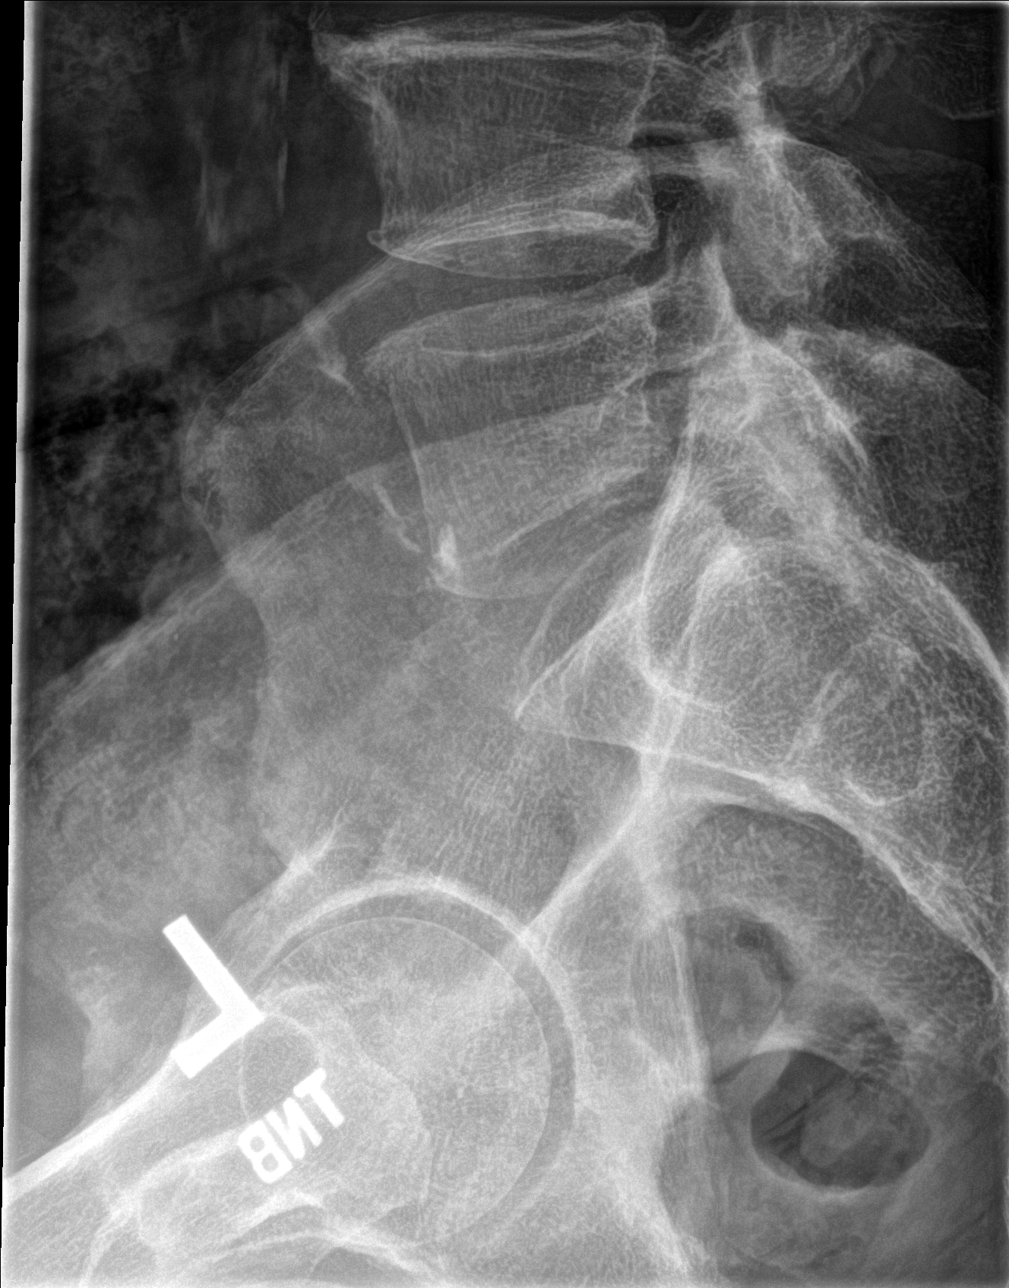

[5 of 5 positions shown; findings below may reference images not displayed]

FINDINGS: Five lumbar type vertebral segments. Vertebral body heights and
alignment are maintained. No fracture identified. Intervertebral
disc spaces are relatively preserved. Minimal degenerative endplate
changes. Mild lower lumbar facet arthrosis. Abdominal aortic
atherosclerosis.
IMPRESSION: Mild lumbar spondylosis. No acute findings.

## 2021-03-19 ENCOUNTER — Encounter (HOSPITAL_COMMUNITY): Payer: Self-pay | Admitting: *Deleted

## 2021-03-19 ENCOUNTER — Emergency Department (HOSPITAL_COMMUNITY)
Admission: EM | Admit: 2021-03-19 | Discharge: 2021-03-19 | Disposition: A | Discharge status: Home / Self Care | Payer: Medicare HMO | Attending: Emergency Medicine | Admitting: Emergency Medicine

## 2021-03-19 ENCOUNTER — Emergency Department (HOSPITAL_COMMUNITY): Payer: Medicare HMO

## 2021-03-19 ENCOUNTER — Other Ambulatory Visit: Payer: Self-pay

## 2021-03-19 DIAGNOSIS — I10 Essential (primary) hypertension: Secondary | ICD-10-CM | POA: Insufficient documentation

## 2021-03-19 DIAGNOSIS — R519 Headache, unspecified: Secondary | ICD-10-CM

## 2021-03-19 DIAGNOSIS — Z7982 Long term (current) use of aspirin: Secondary | ICD-10-CM | POA: Diagnosis not present

## 2021-03-19 DIAGNOSIS — Z87891 Personal history of nicotine dependence: Secondary | ICD-10-CM | POA: Insufficient documentation

## 2021-03-19 DIAGNOSIS — E119 Type 2 diabetes mellitus without complications: Secondary | ICD-10-CM | POA: Insufficient documentation

## 2021-03-19 DIAGNOSIS — Z79899 Other long term (current) drug therapy: Secondary | ICD-10-CM | POA: Insufficient documentation

## 2021-03-19 LAB — CBC WITH DIFFERENTIAL/PLATELET
Abs Immature Granulocytes: 0.02 10*3/uL (ref 0.00–0.07)
Basophils Absolute: 0.1 10*3/uL (ref 0.0–0.1)
Basophils Relative: 1 %
Eosinophils Absolute: 0.3 10*3/uL (ref 0.0–0.5)
Eosinophils Relative: 4 %
HCT: 35.3 % — ABNORMAL LOW (ref 39.0–52.0)
Hemoglobin: 11.8 g/dL — ABNORMAL LOW (ref 13.0–17.0)
Immature Granulocytes: 0 %
Lymphocytes Relative: 28 %
Lymphs Abs: 2.3 10*3/uL (ref 0.7–4.0)
MCH: 33.2 pg (ref 26.0–34.0)
MCHC: 33.4 g/dL (ref 30.0–36.0)
MCV: 99.4 fL (ref 80.0–100.0)
Monocytes Absolute: 0.9 10*3/uL (ref 0.1–1.0)
Monocytes Relative: 11 %
Neutro Abs: 4.7 10*3/uL (ref 1.7–7.7)
Neutrophils Relative %: 56 %
Platelets: 177 10*3/uL (ref 150–400)
RBC: 3.55 MIL/uL — ABNORMAL LOW (ref 4.22–5.81)
RDW: 12.8 % (ref 11.5–15.5)
WBC: 8.3 10*3/uL (ref 4.0–10.5)
nRBC: 0 % (ref 0.0–0.2)

## 2021-03-19 LAB — BASIC METABOLIC PANEL
Anion gap: 9 (ref 5–15)
BUN: 27 mg/dL — ABNORMAL HIGH (ref 8–23)
CO2: 22 mmol/L (ref 22–32)
Calcium: 8.8 mg/dL — ABNORMAL LOW (ref 8.9–10.3)
Chloride: 107 mmol/L (ref 98–111)
Creatinine, Ser: 1.83 mg/dL — ABNORMAL HIGH (ref 0.61–1.24)
GFR, Estimated: 40 mL/min — ABNORMAL LOW (ref >60)
Glucose, Bld: 100 mg/dL — ABNORMAL HIGH (ref 70–99)
Potassium: 3.7 mmol/L (ref 3.5–5.1)
Sodium: 138 mmol/L (ref 135–145)

## 2021-03-19 MED ORDER — SODIUM CHLORIDE 0.9 % IV BOLUS
1000.0000 mL | Freq: Once | INTRAVENOUS | Status: AC
  Administered 2021-03-19: 1000 mL via INTRAVENOUS

## 2021-03-19 MED ORDER — SODIUM CHLORIDE 0.9 % IV SOLN: INTRAVENOUS | Status: DC

## 2021-03-19 MED ORDER — DIPHENHYDRAMINE HCL 50 MG/ML IJ SOLN
12.5000 mg | Freq: Once | INTRAMUSCULAR | Status: AC
  Administered 2021-03-19: 12.5 mg via INTRAVENOUS
  Filled 2021-03-19: qty 1

## 2021-03-19 MED ORDER — METOCLOPRAMIDE HCL 5 MG/ML IJ SOLN
10.0000 mg | Freq: Once | INTRAMUSCULAR | Status: AC
  Administered 2021-03-19: 10 mg via INTRAVENOUS
  Filled 2021-03-19: qty 2

## 2021-03-19 NOTE — ED Notes (Signed)
Pt to MRI

## 2021-03-19 NOTE — ED Triage Notes (Signed)
Pt states he feels like he's been drinking, but he hasn't; pt c/o headache x 2 weeks with worsening of condition last 2 days;pt has been taking antibiotics for the last 2 weeks for a fever
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
## 2021-04-13 ENCOUNTER — Other Ambulatory Visit: Payer: Self-pay | Admitting: Gastroenterology

## 2021-04-30 ENCOUNTER — Other Ambulatory Visit (HOSPITAL_COMMUNITY): Payer: Self-pay | Admitting: Nephrology

## 2021-04-30 DIAGNOSIS — E1129 Type 2 diabetes mellitus with other diabetic kidney complication: Secondary | ICD-10-CM

## 2021-04-30 DIAGNOSIS — I5032 Chronic diastolic (congestive) heart failure: Secondary | ICD-10-CM

## 2021-04-30 DIAGNOSIS — E1122 Type 2 diabetes mellitus with diabetic chronic kidney disease: Secondary | ICD-10-CM

## 2021-04-30 DIAGNOSIS — I129 Hypertensive chronic kidney disease with stage 1 through stage 4 chronic kidney disease, or unspecified chronic kidney disease: Secondary | ICD-10-CM

## 2021-04-30 DIAGNOSIS — D638 Anemia in other chronic diseases classified elsewhere: Secondary | ICD-10-CM

## 2021-04-30 DIAGNOSIS — N181 Chronic kidney disease, stage 1: Secondary | ICD-10-CM

## 2021-04-30 DIAGNOSIS — N281 Cyst of kidney, acquired: Secondary | ICD-10-CM

## 2021-05-01 ENCOUNTER — Other Ambulatory Visit: Payer: Self-pay | Admitting: Gastroenterology

## 2021-05-08 ENCOUNTER — Other Ambulatory Visit: Payer: Self-pay

## 2021-05-08 ENCOUNTER — Ambulatory Visit (HOSPITAL_COMMUNITY)
Admission: RE | Admit: 2021-05-08 | Discharge: 2021-05-08 | Disposition: A | Discharge status: Home / Self Care | Payer: Medicare HMO | Source: Ambulatory Visit | Attending: Nephrology | Admitting: Nephrology

## 2021-05-08 DIAGNOSIS — R809 Proteinuria, unspecified: Secondary | ICD-10-CM | POA: Insufficient documentation

## 2021-05-08 DIAGNOSIS — I5032 Chronic diastolic (congestive) heart failure: Secondary | ICD-10-CM | POA: Insufficient documentation

## 2021-05-08 DIAGNOSIS — N181 Chronic kidney disease, stage 1: Secondary | ICD-10-CM | POA: Diagnosis present

## 2021-05-08 DIAGNOSIS — E1122 Type 2 diabetes mellitus with diabetic chronic kidney disease: Secondary | ICD-10-CM

## 2021-05-08 DIAGNOSIS — I129 Hypertensive chronic kidney disease with stage 1 through stage 4 chronic kidney disease, or unspecified chronic kidney disease: Secondary | ICD-10-CM | POA: Insufficient documentation

## 2021-05-08 DIAGNOSIS — E1129 Type 2 diabetes mellitus with other diabetic kidney complication: Secondary | ICD-10-CM

## 2021-05-08 DIAGNOSIS — N281 Cyst of kidney, acquired: Secondary | ICD-10-CM | POA: Insufficient documentation

## 2021-05-08 DIAGNOSIS — D638 Anemia in other chronic diseases classified elsewhere: Secondary | ICD-10-CM

## 2021-05-22 ENCOUNTER — Ambulatory Visit (INDEPENDENT_AMBULATORY_CARE_PROVIDER_SITE_OTHER): Payer: Medicare HMO | Admitting: Gastroenterology

## 2021-05-22 ENCOUNTER — Other Ambulatory Visit (INDEPENDENT_AMBULATORY_CARE_PROVIDER_SITE_OTHER): Payer: Self-pay

## 2021-05-22 ENCOUNTER — Other Ambulatory Visit: Payer: Self-pay

## 2021-05-22 ENCOUNTER — Encounter (INDEPENDENT_AMBULATORY_CARE_PROVIDER_SITE_OTHER): Payer: Self-pay | Admitting: Gastroenterology

## 2021-05-22 VITALS — BP 122/65 | HR 71 | Temp 98.5°F | Ht 72.0 in | Wt 185.0 lb

## 2021-05-22 DIAGNOSIS — K5903 Drug induced constipation: Secondary | ICD-10-CM

## 2021-05-22 DIAGNOSIS — R109 Unspecified abdominal pain: Secondary | ICD-10-CM

## 2021-05-22 DIAGNOSIS — R1319 Other dysphagia: Secondary | ICD-10-CM

## 2021-05-22 MED ORDER — FAMOTIDINE 40 MG PO TABS: 40.0000 mg | ORAL_TABLET | Freq: Two times a day (BID) | ORAL | 3 refills | Status: DC

## 2021-05-22 MED ORDER — PRUCALOPRIDE SUCCINATE 1 MG PO TABS: 1.0000 mg | ORAL_TABLET | Freq: Every day | ORAL | 3 refills | Status: DC
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
## 2021-05-22 NOTE — Patient Instructions (Signed)
We will schedule you for a repeat EGD due to your dysphagia, given your last EGD had to be ended early due to presence of food in your GI tract.  I have increased your pepcid to 40mg  twice a day. I have sent prucalopride to your pharmacy to see if this will help with your constipation and abdominal pain, there may be a prior authorization required for this before your insurance will cover it   Follow up 6 months

## 2021-05-22 NOTE — Progress Notes (Unsigned)
Brian [name], CMA  

## 2021-05-23 ENCOUNTER — Encounter (INDEPENDENT_AMBULATORY_CARE_PROVIDER_SITE_OTHER): Payer: Self-pay

## 2021-05-30 ENCOUNTER — Telehealth (INDEPENDENT_AMBULATORY_CARE_PROVIDER_SITE_OTHER): Payer: Self-pay | Admitting: *Deleted

## 2021-05-30 NOTE — Telephone Encounter (Signed)
Called [facility] to see what was not covered since we did not get a PA. Pharm states Prucalopride is not covered under plan even if PA is done, it will not be covered. States plan covers lactulose and linzess. I called pt to let him know I would send a message to provider to see what she would like to do.

## 2021-05-30 NOTE — Telephone Encounter (Signed)
Insurance won't cover meds Chelsea prescribed please call [phone]

## 2021-06-02 NOTE — Telephone Encounter (Signed)
Pt called back and states to call his cell. I tried to call and no answer

## 2021-06-02 NOTE — Telephone Encounter (Signed)
Tried to call no answer

## 2021-06-03 NOTE — Telephone Encounter (Signed)
Tried to call no answer

## 2021-06-04 ENCOUNTER — Other Ambulatory Visit (INDEPENDENT_AMBULATORY_CARE_PROVIDER_SITE_OTHER): Payer: Self-pay | Admitting: Gastroenterology

## 2021-06-04 MED ORDER — LACTULOSE 10 GM/15ML PO SOLN: 20.0000 g | Freq: Every day | ORAL | 3 refills | Status: DC

## 2021-06-04 NOTE — Telephone Encounter (Signed)
Tried to call no answer

## 2021-06-04 NOTE — Telephone Encounter (Signed)
Discussed with pt. pt would like to try lactulose and would like it sent to [facility].

## 2021-06-05 NOTE — Telephone Encounter (Signed)
Tried to call no answer

## 2021-06-05 NOTE — Patient Instructions (Signed)
Brian [name]  06/05/2021     @PREFPERIOPPHARMACY @   Your procedure is scheduled on  06/11/2021.   Report to [facility] at  0930  A.M.   Call this number if you have problems the morning of surgery:  [phone]   Remember:  Follow the diet instructions given to you by the office.    Take these medicines the morning of surgery with A SIP OF WATER          amlodipine, baclofen (if needed), celexa, valium (if needed), pepcid, gabapentin, zofran (if needed), topamax.     Do not wear jewelry, make-up or nail polish.  Do not wear lotions, powders, or perfumes, or deodorant.  Do not shave 48 hours prior to surgery.  Men may shave face and neck.  Do not bring valuables to the hospital.  [facility] is not responsible for any belongings or valuables.  Contacts, dentures or bridgework may not be worn into surgery.  Leave your suitcase in the car.  After surgery it may be brought to your room.  For patients admitted to the hospital, discharge time will be determined by your treatment team.  Patients discharged the day of surgery will not be allowed to drive home and must have someone with them for 24 hours.    Special instructions:  DO NOT smoke tobacco or vape for 24 hours before your procedure.  Please read over the following fact sheets that you were given. Anesthesia Post-op Instructions and Care and Recovery After Surgery      Upper Endoscopy, Adult, Care After This sheet gives you information about how to care for yourself after your procedure. Your health care provider may also give you more specific instructions. If you have problems or questions, contact your health care provider. What can I expect after the procedure? After the procedure, it is common to have: A sore throat. Mild stomach pain or discomfort. Bloating. Nausea. Follow these instructions at home:  Follow instructions from your health care provider about what to eat or drink after your  procedure. Return to your normal activities as told by your health care provider. Ask your health care provider what activities are safe for you. Take over-the-counter and prescription medicines only as told by your health care provider. If you were given a sedative during the procedure, it can affect you for several hours. Do not drive or operate machinery until your health care provider says that it is safe. Keep all follow-up visits as told by your health care provider. This is important. Contact a health care provider if you have: A sore throat that lasts longer than one day. Trouble swallowing. Get help right away if: You vomit blood or your vomit looks like coffee grounds. You have: A fever. Bloody, black, or tarry stools. A severe sore throat or you cannot swallow. Difficulty breathing. Severe pain in your chest or abdomen. Summary After the procedure, it is common to have a sore throat, mild stomach discomfort, bloating, and nausea. If you were given a sedative during the procedure, it can affect you for several hours. Do not drive or operate machinery until your health care provider says that it is safe. Follow instructions from your health care provider about what to eat or drink after your procedure. Return to your normal activities as told by your health care provider. This information is not intended to replace advice given to you by your health care provider. Make sure you discuss  any questions you have with your health care provider. Document Revised: 08/15/2019 Document Reviewed: 01/17/2018 Elsevier Patient Education  2022 [employer] After This sheet gives you information about how to care for yourself after your procedure. Your health care provider may also give you more specific instructions. If you have problems or questions, contact your health care provider. What can I expect after the procedure? After the procedure, it is common to  have: Tiredness. Forgetfulness about what happened after the procedure. Impaired judgment for important decisions. Nausea or vomiting. Some difficulty with balance. Follow these instructions at home: For the time period you were told by your health care provider:   Rest as needed. Do not participate in activities where you could fall or become injured. Do not drive or use machinery. Do not drink alcohol. Do not take sleeping pills or medicines that cause drowsiness. Do not make important decisions or sign legal documents. Do not take care of children on your own. Eating and drinking Follow the diet that is recommended by your health care provider. Drink enough fluid to keep your urine pale yellow. If you vomit: Drink water, juice, or soup when you can drink without vomiting. Make sure you have little or no nausea before eating solid foods. General instructions Have a responsible adult stay with you for the time you are told. It is important to have someone help care for you until you are awake and alert. Take over-the-counter and prescription medicines only as told by your health care provider. If you have sleep apnea, surgery and certain medicines can increase your risk for breathing problems. Follow instructions from your health care provider about wearing your sleep device: Anytime you are sleeping, including during daytime naps. While taking prescription pain medicines, sleeping medicines, or medicines that make you drowsy. Avoid smoking. Keep all follow-up visits as told by your health care provider. This is important. Contact a health care provider if: You keep feeling nauseous or you keep vomiting. You feel light-headed. You are still sleepy or having trouble with balance after 24 hours. You develop a rash. You have a fever. You have redness or swelling around the IV site. Get help right away if: You have trouble breathing. You have new-onset confusion at  home. Summary For several hours after your procedure, you may feel tired. You may also be forgetful and have poor judgment. Have a responsible adult stay with you for the time you are told. It is important to have someone help care for you until you are awake and alert. Rest as told. Do not drive or operate machinery. Do not drink alcohol or take sleeping pills. Get help right away if you have trouble breathing, or if you suddenly become confused. This information is not intended to replace advice given to you by your health care provider. Make sure you discuss any questions you have with your health care provider. Document Revised: 05/02/2020 Document Reviewed: 07/20/2019 Elsevier Patient Education  2022 [employer].

## 2021-06-09 ENCOUNTER — Encounter (HOSPITAL_COMMUNITY)
Admission: RE | Admit: 2021-06-09 | Discharge: 2021-06-09 | Disposition: A | Discharge status: Home / Self Care | Payer: Medicare HMO | Source: Ambulatory Visit | Attending: Internal Medicine | Admitting: Internal Medicine

## 2021-06-09 ENCOUNTER — Other Ambulatory Visit: Payer: Self-pay

## 2021-06-09 ENCOUNTER — Encounter (HOSPITAL_COMMUNITY): Payer: Self-pay

## 2021-06-09 DIAGNOSIS — Z01818 Encounter for other preprocedural examination: Secondary | ICD-10-CM | POA: Diagnosis present

## 2021-06-09 HISTORY — DX: Cerebral infarction, unspecified: I63.9

## 2021-06-09 LAB — BASIC METABOLIC PANEL
Anion gap: 6 (ref 5–15)
BUN: 24 mg/dL — ABNORMAL HIGH (ref 8–23)
CO2: 25 mmol/L (ref 22–32)
Calcium: 8.8 mg/dL — ABNORMAL LOW (ref 8.9–10.3)
Chloride: 110 mmol/L (ref 98–111)
Creatinine, Ser: 1.3 mg/dL — ABNORMAL HIGH (ref 0.61–1.24)
GFR, Estimated: >60 mL/min (ref >60)
Glucose, Bld: 105 mg/dL — ABNORMAL HIGH (ref 70–99)
Potassium: 4.4 mmol/L (ref 3.5–5.1)
Sodium: 141 mmol/L (ref 135–145)

## 2021-06-11 ENCOUNTER — Ambulatory Visit (HOSPITAL_COMMUNITY): Payer: Medicare HMO | Admitting: Anesthesiology

## 2021-06-11 ENCOUNTER — Ambulatory Visit (HOSPITAL_COMMUNITY)
Admission: RE | Admit: 2021-06-11 | Discharge: 2021-06-11 | Disposition: A | Discharge status: Home / Self Care | Payer: Medicare HMO | Attending: Internal Medicine | Admitting: Internal Medicine

## 2021-06-11 ENCOUNTER — Encounter (HOSPITAL_COMMUNITY): Payer: Self-pay | Admitting: Internal Medicine

## 2021-06-11 ENCOUNTER — Encounter (HOSPITAL_COMMUNITY)
Admission: RE | Disposition: A | Discharge status: Home / Self Care | Payer: Self-pay | Source: Home / Self Care | Attending: Internal Medicine

## 2021-06-11 DIAGNOSIS — Z833 Family history of diabetes mellitus: Secondary | ICD-10-CM | POA: Insufficient documentation

## 2021-06-11 DIAGNOSIS — K3184 Gastroparesis: Secondary | ICD-10-CM | POA: Diagnosis not present

## 2021-06-11 DIAGNOSIS — Z79899 Other long term (current) drug therapy: Secondary | ICD-10-CM | POA: Insufficient documentation

## 2021-06-11 DIAGNOSIS — Z7982 Long term (current) use of aspirin: Secondary | ICD-10-CM | POA: Insufficient documentation

## 2021-06-11 DIAGNOSIS — Z7984 Long term (current) use of oral hypoglycemic drugs: Secondary | ICD-10-CM | POA: Diagnosis not present

## 2021-06-11 DIAGNOSIS — K449 Diaphragmatic hernia without obstruction or gangrene: Secondary | ICD-10-CM | POA: Insufficient documentation

## 2021-06-11 DIAGNOSIS — F17210 Nicotine dependence, cigarettes, uncomplicated: Secondary | ICD-10-CM | POA: Insufficient documentation

## 2021-06-11 DIAGNOSIS — E1143 Type 2 diabetes mellitus with diabetic autonomic (poly)neuropathy: Secondary | ICD-10-CM | POA: Insufficient documentation

## 2021-06-11 DIAGNOSIS — Z888 Allergy status to other drugs, medicaments and biological substances status: Secondary | ICD-10-CM | POA: Insufficient documentation

## 2021-06-11 DIAGNOSIS — Z800 Family history of malignant neoplasm of digestive organs: Secondary | ICD-10-CM | POA: Insufficient documentation

## 2021-06-11 DIAGNOSIS — K2970 Gastritis, unspecified, without bleeding: Secondary | ICD-10-CM | POA: Insufficient documentation

## 2021-06-11 DIAGNOSIS — K219 Gastro-esophageal reflux disease without esophagitis: Secondary | ICD-10-CM | POA: Diagnosis not present

## 2021-06-11 DIAGNOSIS — R1314 Dysphagia, pharyngoesophageal phase: Secondary | ICD-10-CM | POA: Insufficient documentation

## 2021-06-11 DIAGNOSIS — Z882 Allergy status to sulfonamides status: Secondary | ICD-10-CM | POA: Diagnosis not present

## 2021-06-11 DIAGNOSIS — R1319 Other dysphagia: Secondary | ICD-10-CM

## 2021-06-11 DIAGNOSIS — Z8673 Personal history of transient ischemic attack (TIA), and cerebral infarction without residual deficits: Secondary | ICD-10-CM | POA: Insufficient documentation

## 2021-06-11 DIAGNOSIS — Z881 Allergy status to other antibiotic agents status: Secondary | ICD-10-CM | POA: Diagnosis not present

## 2021-06-11 DIAGNOSIS — K2289 Other specified disease of esophagus: Secondary | ICD-10-CM | POA: Diagnosis not present

## 2021-06-11 HISTORY — PX: BIOPSY: SHX5522

## 2021-06-11 HISTORY — PX: ESOPHAGOGASTRODUODENOSCOPY (EGD) WITH PROPOFOL: SHX5813

## 2021-06-11 LAB — GLUCOSE, CAPILLARY: Glucose-Capillary: 95 mg/dL (ref 70–99)

## 2021-06-11 SURGERY — ESOPHAGOGASTRODUODENOSCOPY (EGD) WITH PROPOFOL
Anesthesia: General

## 2021-06-11 MED ORDER — LIDOCAINE HCL (PF) 2 % IJ SOLN
INTRAMUSCULAR | Status: AC
  Filled 2021-06-11: qty 5

## 2021-06-11 MED ORDER — LACTATED RINGERS IV SOLN
INTRAVENOUS | Status: DC
  Administered 2021-06-11: 1000 mL via INTRAVENOUS

## 2021-06-11 MED ORDER — PROPOFOL 10 MG/ML IV BOLUS
INTRAVENOUS | Status: DC | PRN
  Administered 2021-06-11: 130 mg via INTRAVENOUS

## 2021-06-11 MED ORDER — ONDANSETRON HCL 4 MG/2ML IJ SOLN
INTRAMUSCULAR | Status: DC | PRN
  Administered 2021-06-11: 4 mg via INTRAVENOUS

## 2021-06-11 MED ORDER — FENTANYL CITRATE (PF) 100 MCG/2ML IJ SOLN
INTRAMUSCULAR | Status: DC | PRN
  Administered 2021-06-11 (×2): 50 ug via INTRAVENOUS

## 2021-06-11 MED ORDER — LIDOCAINE 2% (20 MG/ML) 5 ML SYRINGE
INTRAMUSCULAR | Status: DC | PRN
Start: 2021-06-11 — End: 2021-06-11
  Administered 2021-06-11: 100 mg via INTRAVENOUS

## 2021-06-11 MED ORDER — OXYCODONE HCL 5 MG PO TABS
ORAL_TABLET | ORAL | Status: AC
  Filled 2021-06-11: qty 1

## 2021-06-11 MED ORDER — FENTANYL CITRATE (PF) 100 MCG/2ML IJ SOLN
INTRAMUSCULAR | Status: AC
  Filled 2021-06-11: qty 2

## 2021-06-11 MED ORDER — SUCCINYLCHOLINE CHLORIDE 200 MG/10ML IV SOSY
PREFILLED_SYRINGE | INTRAVENOUS | Status: AC
  Filled 2021-06-11: qty 10

## 2021-06-11 MED ORDER — SUCCINYLCHOLINE CHLORIDE 200 MG/10ML IV SOSY
PREFILLED_SYRINGE | INTRAVENOUS | Status: DC | PRN
  Administered 2021-06-11: 100 mg via INTRAVENOUS

## 2021-06-11 MED ORDER — ONDANSETRON HCL 4 MG/2ML IJ SOLN
INTRAMUSCULAR | Status: AC
  Filled 2021-06-11: qty 2

## 2021-06-11 MED ORDER — PROPOFOL 10 MG/ML IV BOLUS
INTRAVENOUS | Status: AC
  Filled 2021-06-11: qty 20

## 2021-06-11 MED ORDER — ASPIRIN EC 81 MG PO TBEC: 81.0000 mg | DELAYED_RELEASE_TABLET | Freq: Every day | ORAL | 11 refills | Status: AC

## 2021-06-11 MED ORDER — OXYCODONE HCL 5 MG PO TABS
5.0000 mg | ORAL_TABLET | Freq: Once | ORAL | Status: AC
Start: 2021-06-11 — End: 2021-06-11
  Administered 2021-06-11: 5 mg via ORAL

## 2021-06-11 NOTE — Op Note (Signed)
[facility] Patient Name: Brian [name] Procedure Date: 06/11/2021 10:17 AM MRN: [id number] Date of Birth: 1956/03/17 Attending MD: Hildred Laser , MD CSN: [id number] Age: 65 Admit Type: Outpatient Procedure:                Upper GI endoscopy Indications:              Esophageal dysphagia Providers:                Hildred Laser, MD, Janeece Riggers, RN, Raphael Gibney,                            Technician Referring MD:             Vidal Schwalbe, MD Medicines:                General Anesthesia Complications:            No immediate complications. Estimated Blood Loss:     Estimated blood loss was minimal. Procedure:                Pre-Anesthesia Assessment:                           - Prior to the procedure, a History and Physical                            was performed, and patient medications and                            allergies were reviewed. The patient's tolerance of                            previous anesthesia was also reviewed. The risks                            and benefits of the procedure and the sedation                            options and risks were discussed with the patient.                            All questions were answered, and informed consent                            was obtained. Prior Anticoagulants: The patient has                            taken no previous anticoagulant or antiplatelet                            agents except for aspirin. ASA Grade Assessment: IV                            - A patient with severe systemic disease that is a  constant threat to life. After reviewing the risks                            and benefits, the patient was deemed in                            satisfactory condition to undergo the procedure.                           After obtaining informed consent, the endoscope was                            passed under direct vision. Throughout the                            procedure, the  patient's blood pressure, pulse, and                            oxygen saturations were monitored continuously. The                            GIF-H190 ([id number]) scope was introduced through the                            mouth, and advanced to the second part of duodenum.                            The upper GI endoscopy was accomplished without                            difficulty. The patient tolerated the procedure                            well. Scope In: 10:39:59 AM Scope Out: 10:53:30 AM Total Procedure Duration: 0 hours 13 minutes 31 seconds  Findings:      The hypopharynx was normal.      The examined esophagus was normal.      The Z-line was irregular and was found 41 cm from the incisors.      A 2 cm hiatal hernia was present.      No endoscopic abnormality was evident in the esophagus to explain the       patient's complaint of dysphagia. It was decided, however, to proceed       with dilation of the entire esophagus. The scope was withdrawn. Dilation       was performed with a Maloney dilator with mild resistance at 30 Fr. The       dilation site was examined following endoscope reinsertion and showed no       bleeding, mucosal tear or perforation. Biopsies were taken with a cold       forceps for histology. The pathology specimen was placed into Bottle       Number 2.      Localized mild inflammation characterized by congestion (edema) and       erosions was found in the prepyloric region of the stomach. Biopsies  were taken with a cold forceps for histology. The pathology specimen was       placed into Bottle Number 1.      The exam of the stomach was otherwise normal.      The duodenal bulb and second portion of the duodenum were normal. Impression:               - Normal hypopharynx.                           - Normal esophagus.                           - Z-line irregular, 41 cm from the incisors.                           - 2 cm hiatal hernia.                            - No endoscopic esophageal abnormality to explain                            patient's dysphagia. Esophagus dilated. Dilated.                            Biopsied.                           - Gastritis. Biopsied.                           - Normal duodenal bulb and second portion of the                            duodenum. Moderate Sedation:      Per Anesthesia Care Recommendation:           - Patient has a contact number available for                            emergencies. The signs and symptoms of potential                            delayed complications were discussed with the                            patient. Return to normal activities tomorrow.                            Written discharge instructions were provided to the                            patient.                           - Resume previous diet today.                           - Continue  present medications.                           - No aspirin, ibuprofen, naproxen, or other                            non-steroidal anti-inflammatory drugs for 1 day.                           - Await pathology results. Procedure Code(s):        --- Professional ---                           [phone], Esophagogastroduodenoscopy, flexible,                            transoral; with biopsy, single or multiple                           43450, Dilation of esophagus, by unguided sound or                            bougie, single or multiple passes Diagnosis Code(s):        --- Professional ---                           K22.8, Other specified diseases of esophagus                           K44.9, Diaphragmatic hernia without obstruction or                            gangrene                           K29.70, Gastritis, unspecified, without bleeding                           R13.14, Dysphagia, pharyngoesophageal phase CPT copyright 2019 American Medical Association. All rights reserved. The codes documented in this report are  preliminary and upon coder review may  be revised to meet current compliance requirements. Hildred Laser, MD Hildred Laser, MD 06/11/2021 11:04:12 AM This report has been signed electronically. Number of Addenda: 0

## 2021-06-11 NOTE — Anesthesia Procedure Notes (Signed)
Procedure Name: Intubation Date/Time: 06/11/2021 10:35 AM Performed by: Orlie Dakin, CRNA Pre-anesthesia Checklist: Patient identified, Suction available, Patient being monitored and Emergency Drugs available Patient Re-evaluated:Patient Re-evaluated prior to induction Oxygen Delivery Method: Circle system utilized Preoxygenation: Pre-oxygenation with 100% oxygen Induction Type: IV induction, Rapid sequence and Cricoid Pressure applied Laryngoscope Size: Glidescope and 3 Grade View: Grade I Tube type: Oral Tube size: 7.5 mm Number of attempts: 1 Airway Equipment and Method: Rigid stylet and Video-laryngoscopy Placement Confirmation: ETT inserted through vocal cords under direct vision, positive ETCO2 and breath sounds checked- equal and bilateral Secured at: 23 cm Tube secured with: Tape Dental Injury: Teeth and Oropharynx as per pre-operative assessment
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
## 2021-06-11 NOTE — Transfer of Care (Signed)
Immediate Anesthesia Transfer of Care Note  Patient: Brian [name]  Procedure(s) Performed: ESOPHAGOGASTRODUODENOSCOPY (EGD) WITH PROPOFOL BIOPSY  Patient Location: PACU  Anesthesia Type:General  Level of Consciousness: awake, alert  and oriented  Airway & Oxygen Therapy: Patient Spontanous Breathing and Patient connected to face mask oxygen  Post-op Assessment: Report given to RN, Post -op Vital signs reviewed and stable and Patient moving all extremities X 4  Post vital signs: Reviewed and stable  Last Vitals:  Vitals Value Taken Time  BP 155/68 06/11/21 1058  Temp 36.6 C 06/11/21 1058  Pulse 72 06/11/21 1058  Resp 13 06/11/21 1058  SpO2 100 % 06/11/21 1058  Vitals shown include unvalidated device data.  Last Pain:  Vitals:   06/11/21 0935  TempSrc: Oral  PainSc: 10-Worst pain ever      Patients Stated Pain Goal: 7 (15/83/09 4076)  Complications: No notable events documented.

## 2021-06-11 NOTE — Discharge Instructions (Addendum)
No aspirin or NSAIDs for 24 hours. Resume other medications as before. Resume usual diet. No driving for 24 hours. Physician will call with biopsy results.   YOU RECEIVED 5 MG OXYCODONE IR AT  [facility] @ 11:05 AM.  YOUR NEXT DOSE IS DUE LATER THIS AFTERNOON BASED ON PREVIOUSLY PRESCRIBED DIRECTIONS (CHECK OXY IR BOTTLE AT HOME).

## 2021-06-11 NOTE — Anesthesia Postprocedure Evaluation (Signed)
Anesthesia Post Note  Patient: Brian [name]  Procedure(s) Performed: ESOPHAGOGASTRODUODENOSCOPY (EGD) WITH PROPOFOL BIOPSY  Patient location during evaluation: Phase II Anesthesia Type: General Level of consciousness: awake and alert Pain management: pain level controlled Vital Signs Assessment: post-procedure vital signs reviewed and stable Respiratory status: spontaneous breathing, nonlabored ventilation and respiratory function stable Cardiovascular status: blood pressure returned to baseline and stable Postop Assessment: no apparent nausea or vomiting Anesthetic complications: no   No notable events documented.   Last Vitals:  Vitals:   06/11/21 1100 06/11/21 1115  BP: (!) 139/92 134/74  Pulse: 70 63  Resp: 12 11  Temp:  36.4 C  SpO2: 100% 98%    Last Pain:  Vitals:   06/11/21 1115  TempSrc: Axillary  PainSc: 10-Worst pain ever                 Nishanth Mccaughan C Clemmie Marxen

## 2021-06-11 NOTE — Anesthesia Preprocedure Evaluation (Signed)
Anesthesia Evaluation  Patient identified by MRN, date of birth, ID band Patient awake    Reviewed: Allergy & Precautions, NPO status , Patient's Chart, lab work & pertinent test results  History of Anesthesia Complications (+) AWARENESS UNDER ANESTHESIA and history of anesthetic complications  Airway Mallampati: II  TM Distance: >3 FB Neck ROM: Full    Dental  (+) Dental Advisory Given, Missing, Chipped, Poor Dentition   Pulmonary shortness of breath and with exertion, sleep apnea , Current Smoker (pack a daynow, used to smoke more than 2 packs/day)Patient did not abstain from smoking.,    Pulmonary exam normal breath sounds clear to auscultation       Cardiovascular Exercise Tolerance: Poor hypertension, Pt. on medications  Rhythm:Regular Rate:Normal + Systolic murmurs    Neuro/Psych  Headaches, Seizures -, Well Controlled,  PSYCHIATRIC DISORDERS Anxiety Depression  Neuromuscular disease (loss of balance) CVA (MRI findings, patient doesn't know when it happened)    GI/Hepatic Neg liver ROS, GERD (gastroparesis )  Medicated and Poorly Controlled,  Endo/Other  diabetes, Well Controlled, Type 2, Oral Hypoglycemic Agents  Renal/GU Renal InsufficiencyRenal disease     Musculoskeletal  (+) Arthritis , Osteoarthritis,    Abdominal   Peds  Hematology   Anesthesia Other Findings   Reproductive/Obstetrics                             Anesthesia Physical  Anesthesia Plan  ASA: 4  Anesthesia Plan: General   Post-op Pain Management:    Induction: Intravenous, Rapid sequence and Cricoid pressure planned  PONV Risk Score and Plan: 2  Airway Management Planned: Oral ETT  Additional Equipment:   Intra-op Plan:   Post-operative Plan: Extubation in OR  Informed Consent: I have reviewed the patients History and Physical, chart, labs and discussed the procedure including the risks, benefits and  alternatives for the proposed anesthesia with the patient or authorized representative who has indicated his/her understanding and acceptance.     Dental advisory given  Plan Discussed with: CRNA and Surgeon  Anesthesia Plan Comments:         Anesthesia Quick Evaluation

## 2021-06-12 LAB — SURGICAL PATHOLOGY

## 2021-06-16 ENCOUNTER — Telehealth (INDEPENDENT_AMBULATORY_CARE_PROVIDER_SITE_OTHER): Payer: Self-pay | Admitting: *Deleted

## 2021-06-16 ENCOUNTER — Encounter (HOSPITAL_COMMUNITY): Payer: Self-pay | Admitting: Internal Medicine

## 2021-06-16 NOTE — Telephone Encounter (Signed)
Pt called to get biopsy results and to change number in chart. New number [phone]

## 2021-06-17 NOTE — Telephone Encounter (Signed)
Number given to dr Laural Golden and he states he will call patient

## 2021-06-23 NOTE — Telephone Encounter (Signed)
Patient called and states he wanted to know what the results of recent Biopsy were.

## 2021-06-23 NOTE — Telephone Encounter (Signed)
I spoke with patient at [phone] and changed in the system. Advised that Dr.Rehman was off today I would have Dr. Laural Golden reach out to him when he returns.

## 2021-06-24 NOTE — Telephone Encounter (Signed)
Per Dr. Laural Golden he will try calling the patient again.

## 2021-09-25 ENCOUNTER — Other Ambulatory Visit (INDEPENDENT_AMBULATORY_CARE_PROVIDER_SITE_OTHER): Payer: Self-pay | Admitting: Gastroenterology

## 2021-09-25 ENCOUNTER — Other Ambulatory Visit: Payer: Self-pay | Admitting: Gastroenterology

## 2021-10-13 ENCOUNTER — Other Ambulatory Visit: Payer: Self-pay

## 2021-10-13 ENCOUNTER — Other Ambulatory Visit (INDEPENDENT_AMBULATORY_CARE_PROVIDER_SITE_OTHER): Payer: Self-pay

## 2021-10-13 ENCOUNTER — Ambulatory Visit (INDEPENDENT_AMBULATORY_CARE_PROVIDER_SITE_OTHER): Payer: Medicare HMO | Admitting: Gastroenterology

## 2021-10-13 ENCOUNTER — Encounter (INDEPENDENT_AMBULATORY_CARE_PROVIDER_SITE_OTHER): Payer: Self-pay | Admitting: Gastroenterology

## 2021-10-13 VITALS — BP 159/72 | HR 58 | Temp 98.0°F | Ht 72.0 in | Wt 197.2 lb

## 2021-10-13 DIAGNOSIS — R198 Other specified symptoms and signs involving the digestive system and abdomen: Secondary | ICD-10-CM

## 2021-10-13 DIAGNOSIS — R1032 Left lower quadrant pain: Secondary | ICD-10-CM | POA: Diagnosis not present

## 2021-10-13 MED ORDER — HYOSCYAMINE SULFATE 0.125 MG SL SUBL: 0.1250 mg | SUBLINGUAL_TABLET | Freq: Four times a day (QID) | SUBLINGUAL | 1 refills | Status: DC | PRN

## 2021-10-13 NOTE — Patient Instructions (Addendum)
Schedule CT abdomen/pelvis with IV contrast ASAP. If pain is severe and cannot wait to have testing, may go to the ER for further evaluation Perform blood and stool workup Do not take Motegrity Start Levsin 1 tablet q8h as needed for abdominal pain Avoid taking ibuprofen
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
## 2021-10-14 LAB — CBC WITH DIFFERENTIAL/PLATELET
Absolute Monocytes: 1222 cells/uL — ABNORMAL HIGH (ref 200–950)
Basophils Absolute: 68 cells/uL (ref 0–200)
Basophils Relative: 0.7 %
Eosinophils Absolute: 446 cells/uL (ref 15–500)
Eosinophils Relative: 4.6 %
HCT: 38.5 % (ref 38.5–50.0)
Hemoglobin: 13 g/dL — ABNORMAL LOW (ref 13.2–17.1)
Lymphs Abs: 2231 cells/uL (ref 850–3900)
MCH: 32.7 pg (ref 27.0–33.0)
MCHC: 33.8 g/dL (ref 32.0–36.0)
MCV: 96.7 fL (ref 80.0–100.0)
MPV: 11 fL (ref 7.5–12.5)
Monocytes Relative: 12.6 %
Neutro Abs: 5733 cells/uL (ref 1500–7800)
Neutrophils Relative %: 59.1 %
Platelets: 196 10*3/uL (ref 140–400)
RBC: 3.98 10*6/uL — ABNORMAL LOW (ref 4.20–5.80)
RDW: 12.8 % (ref 11.0–15.0)
Total Lymphocyte: 23 %
WBC: 9.7 10*3/uL (ref 3.8–10.8)

## 2021-10-14 LAB — COMPREHENSIVE METABOLIC PANEL
AG Ratio: 1.6 (calc) (ref 1.0–2.5)
ALT: 15 U/L (ref 9–46)
AST: 12 U/L (ref 10–35)
Albumin: 4 g/dL (ref 3.6–5.1)
Alkaline phosphatase (APISO): 56 U/L (ref 35–144)
BUN/Creatinine Ratio: 20 (calc) (ref 6–22)
BUN: 30 mg/dL — ABNORMAL HIGH (ref 7–25)
CO2: 28 mmol/L (ref 20–32)
Calcium: 9.3 mg/dL (ref 8.6–10.3)
Chloride: 108 mmol/L (ref 98–110)
Creat: 1.53 mg/dL — ABNORMAL HIGH (ref 0.70–1.35)
Globulin: 2.5 g/dL (calc) (ref 1.9–3.7)
Glucose, Bld: 100 mg/dL — ABNORMAL HIGH (ref 65–99)
Potassium: 5.3 mmol/L (ref 3.5–5.3)
Sodium: 142 mmol/L (ref 135–146)
Total Bilirubin: 0.3 mg/dL (ref 0.2–1.2)
Total Protein: 6.5 g/dL (ref 6.1–8.1)

## 2021-10-17 ENCOUNTER — Ambulatory Visit (HOSPITAL_COMMUNITY)
Admission: RE | Admit: 2021-10-17 | Discharge: 2021-10-17 | Disposition: A | Discharge status: Home / Self Care | Payer: Medicare HMO | Source: Ambulatory Visit | Attending: Gastroenterology | Admitting: Gastroenterology

## 2021-10-17 ENCOUNTER — Other Ambulatory Visit: Payer: Self-pay

## 2021-10-17 ENCOUNTER — Ambulatory Visit (HOSPITAL_BASED_OUTPATIENT_CLINIC_OR_DEPARTMENT_OTHER): Admission: RE | Admit: 2021-10-17 | Payer: Medicare HMO | Source: Ambulatory Visit

## 2021-10-17 DIAGNOSIS — R198 Other specified symptoms and signs involving the digestive system and abdomen: Secondary | ICD-10-CM | POA: Diagnosis present

## 2021-10-17 DIAGNOSIS — R1032 Left lower quadrant pain: Secondary | ICD-10-CM

## 2021-10-17 MED ORDER — IOHEXOL 300 MG/ML  SOLN
100.0000 mL | Freq: Once | INTRAMUSCULAR | Status: AC | PRN
  Administered 2021-10-17: 100 mL via INTRAVENOUS

## 2021-10-20 ENCOUNTER — Telehealth (INDEPENDENT_AMBULATORY_CARE_PROVIDER_SITE_OTHER): Payer: Self-pay

## 2021-10-20 ENCOUNTER — Other Ambulatory Visit (INDEPENDENT_AMBULATORY_CARE_PROVIDER_SITE_OTHER): Payer: Self-pay | Admitting: Gastroenterology

## 2021-10-20 DIAGNOSIS — A0472 Enterocolitis due to Clostridium difficile, not specified as recurrent: Secondary | ICD-10-CM

## 2021-10-20 LAB — GASTROINTESTINAL PATHOGEN PANEL PCR
C. difficile Tox A/B, PCR: DETECTED — AB
Campylobacter, PCR: NOT DETECTED
Cryptosporidium, PCR: NOT DETECTED
E coli (ETEC) LT/ST PCR: NOT DETECTED
E coli (STEC) stx1/stx2, PCR: NOT DETECTED
E coli 0157, PCR: NOT DETECTED
Giardia lamblia, PCR: NOT DETECTED
Norovirus, PCR: NOT DETECTED
Rotavirus A, PCR: NOT DETECTED
Salmonella, PCR: NOT DETECTED
Shigella, PCR: NOT DETECTED

## 2021-10-20 LAB — C. DIFFICILE GDH AND TOXIN A/B
GDH ANTIGEN: NOT DETECTED
MICRO NUMBER:: 13024855
SPECIMEN QUALITY:: ADEQUATE
TOXIN A AND B: NOT DETECTED

## 2021-10-20 MED ORDER — VANCOMYCIN HCL 125 MG PO CAPS: 125.0000 mg | ORAL_CAPSULE | Freq: Four times a day (QID) | ORAL | 0 refills | Status: AC

## 2021-10-20 NOTE — Telephone Encounter (Signed)
Vancomycin 125 capsule PA Case: [id number], Status: Approved, Coverage Starts on: 08/31/2021 12:00:00 AM, Coverage Ends on: 08/30/2022 12:00:00 AM. Questions? Contact [phone].

## 2021-10-29 ENCOUNTER — Telehealth (INDEPENDENT_AMBULATORY_CARE_PROVIDER_SITE_OTHER): Payer: Self-pay

## 2021-10-29 NOTE — Telephone Encounter (Signed)
Patient was given Vancomycin 125 mg # 56 for E coli. He states this medication cost $ 95.00 and he says he cant afford this. Is there another alternative that we can send in for him. I advised that this may be the only option, he says if it is he will pay for it but this will put a strain on him. Please advise. ?

## 2021-10-29 NOTE — Telephone Encounter (Signed)
Patient states he will pick up the vancomycin. ?

## 2021-10-29 NOTE — Telephone Encounter (Signed)
This is the best option for his infection, which is quite serious. We can try another medicine like Flagyl but it may not be as effective. ?

## 2021-10-30 ENCOUNTER — Encounter (INDEPENDENT_AMBULATORY_CARE_PROVIDER_SITE_OTHER): Payer: Self-pay

## 2021-10-30 ENCOUNTER — Other Ambulatory Visit (INDEPENDENT_AMBULATORY_CARE_PROVIDER_SITE_OTHER): Payer: Self-pay

## 2021-10-30 ENCOUNTER — Telehealth (INDEPENDENT_AMBULATORY_CARE_PROVIDER_SITE_OTHER): Payer: Self-pay

## 2021-10-30 DIAGNOSIS — R933 Abnormal findings on diagnostic imaging of other parts of digestive tract: Secondary | ICD-10-CM

## 2021-10-30 MED ORDER — PEG 3350-KCL-NA BICARB-NACL 420 G PO SOLR: 4000.0000 mL | ORAL | 0 refills | Status: DC

## 2021-10-30 NOTE — Telephone Encounter (Signed)
Brian [name] [name], CMA  ?

## 2021-11-03 ENCOUNTER — Ambulatory Visit: Payer: Medicare HMO | Admitting: Orthopedic Surgery

## 2021-11-20 ENCOUNTER — Ambulatory Visit (INDEPENDENT_AMBULATORY_CARE_PROVIDER_SITE_OTHER): Payer: Medicare HMO | Admitting: Gastroenterology

## 2021-12-03 ENCOUNTER — Other Ambulatory Visit: Payer: Self-pay

## 2021-12-03 ENCOUNTER — Encounter (HOSPITAL_COMMUNITY): Payer: Self-pay | Admitting: Gastroenterology

## 2021-12-03 ENCOUNTER — Ambulatory Visit (HOSPITAL_COMMUNITY)
Admission: RE | Admit: 2021-12-03 | Discharge: 2021-12-03 | Disposition: A | Discharge status: Home / Self Care | Payer: Medicare HMO | Attending: Gastroenterology | Admitting: Gastroenterology

## 2021-12-03 ENCOUNTER — Encounter (HOSPITAL_COMMUNITY)
Admission: RE | Disposition: A | Discharge status: Home / Self Care | Payer: Self-pay | Source: Home / Self Care | Attending: Gastroenterology

## 2021-12-03 ENCOUNTER — Ambulatory Visit (HOSPITAL_COMMUNITY): Payer: Medicare HMO | Admitting: Anesthesiology

## 2021-12-03 ENCOUNTER — Ambulatory Visit (HOSPITAL_BASED_OUTPATIENT_CLINIC_OR_DEPARTMENT_OTHER): Payer: Medicare HMO | Admitting: Anesthesiology

## 2021-12-03 DIAGNOSIS — Z7984 Long term (current) use of oral hypoglycemic drugs: Secondary | ICD-10-CM | POA: Insufficient documentation

## 2021-12-03 DIAGNOSIS — Z79891 Long term (current) use of opiate analgesic: Secondary | ICD-10-CM | POA: Insufficient documentation

## 2021-12-03 DIAGNOSIS — K219 Gastro-esophageal reflux disease without esophagitis: Secondary | ICD-10-CM | POA: Diagnosis not present

## 2021-12-03 DIAGNOSIS — K5730 Diverticulosis of large intestine without perforation or abscess without bleeding: Secondary | ICD-10-CM

## 2021-12-03 DIAGNOSIS — G40909 Epilepsy, unspecified, not intractable, without status epilepticus: Secondary | ICD-10-CM | POA: Insufficient documentation

## 2021-12-03 DIAGNOSIS — K589 Irritable bowel syndrome without diarrhea: Secondary | ICD-10-CM | POA: Insufficient documentation

## 2021-12-03 DIAGNOSIS — F17210 Nicotine dependence, cigarettes, uncomplicated: Secondary | ICD-10-CM | POA: Insufficient documentation

## 2021-12-03 DIAGNOSIS — D122 Benign neoplasm of ascending colon: Secondary | ICD-10-CM | POA: Insufficient documentation

## 2021-12-03 DIAGNOSIS — I10 Essential (primary) hypertension: Secondary | ICD-10-CM

## 2021-12-03 DIAGNOSIS — K3184 Gastroparesis: Secondary | ICD-10-CM | POA: Diagnosis not present

## 2021-12-03 DIAGNOSIS — K648 Other hemorrhoids: Secondary | ICD-10-CM | POA: Diagnosis not present

## 2021-12-03 DIAGNOSIS — R197 Diarrhea, unspecified: Secondary | ICD-10-CM | POA: Insufficient documentation

## 2021-12-03 DIAGNOSIS — E1143 Type 2 diabetes mellitus with diabetic autonomic (poly)neuropathy: Secondary | ICD-10-CM | POA: Diagnosis not present

## 2021-12-03 DIAGNOSIS — F32A Depression, unspecified: Secondary | ICD-10-CM | POA: Diagnosis not present

## 2021-12-03 DIAGNOSIS — Z8673 Personal history of transient ischemic attack (TIA), and cerebral infarction without residual deficits: Secondary | ICD-10-CM | POA: Insufficient documentation

## 2021-12-03 DIAGNOSIS — D123 Benign neoplasm of transverse colon: Secondary | ICD-10-CM | POA: Diagnosis not present

## 2021-12-03 DIAGNOSIS — R948 Abnormal results of function studies of other organs and systems: Secondary | ICD-10-CM | POA: Insufficient documentation

## 2021-12-03 DIAGNOSIS — D128 Benign neoplasm of rectum: Secondary | ICD-10-CM | POA: Insufficient documentation

## 2021-12-03 DIAGNOSIS — K621 Rectal polyp: Secondary | ICD-10-CM | POA: Diagnosis not present

## 2021-12-03 DIAGNOSIS — R933 Abnormal findings on diagnostic imaging of other parts of digestive tract: Secondary | ICD-10-CM | POA: Insufficient documentation

## 2021-12-03 DIAGNOSIS — F419 Anxiety disorder, unspecified: Secondary | ICD-10-CM | POA: Diagnosis not present

## 2021-12-03 DIAGNOSIS — K635 Polyp of colon: Secondary | ICD-10-CM

## 2021-12-03 HISTORY — PX: BIOPSY: SHX5522

## 2021-12-03 HISTORY — PX: COLONOSCOPY WITH PROPOFOL: SHX5780

## 2021-12-03 HISTORY — PX: POLYPECTOMY: SHX5525

## 2021-12-03 LAB — HM COLONOSCOPY

## 2021-12-03 SURGERY — COLONOSCOPY WITH PROPOFOL
Anesthesia: General

## 2021-12-03 MED ORDER — ONDANSETRON HCL 4 MG/2ML IJ SOLN
4.0000 mg | Freq: Once | INTRAMUSCULAR | Status: AC
  Administered 2021-12-03: 4 mg via INTRAVENOUS

## 2021-12-03 MED ORDER — ONDANSETRON HCL 4 MG/2ML IJ SOLN
INTRAMUSCULAR | Status: AC
  Filled 2021-12-03: qty 2

## 2021-12-03 MED ORDER — MIDAZOLAM HCL 2 MG/2ML IJ SOLN
INTRAMUSCULAR | Status: AC
  Filled 2021-12-03: qty 2

## 2021-12-03 MED ORDER — PROPOFOL 500 MG/50ML IV EMUL
INTRAVENOUS | Status: DC | PRN
  Administered 2021-12-03: 175 ug/kg/min via INTRAVENOUS

## 2021-12-03 MED ORDER — FENTANYL CITRATE (PF) 100 MCG/2ML IJ SOLN
50.0000 ug | INTRAMUSCULAR | Status: AC | PRN
  Administered 2021-12-03 (×2): 50 ug via INTRAVENOUS

## 2021-12-03 MED ORDER — LACTATED RINGERS IV SOLN: INTRAVENOUS | Status: DC

## 2021-12-03 MED ORDER — FENTANYL CITRATE (PF) 100 MCG/2ML IJ SOLN
INTRAMUSCULAR | Status: AC
Start: 2021-12-03 — End: ?
  Filled 2021-12-03: qty 2

## 2021-12-03 MED ORDER — EPHEDRINE 5 MG/ML INJ
INTRAVENOUS | Status: AC
  Filled 2021-12-03: qty 5

## 2021-12-03 MED ORDER — EPHEDRINE SULFATE-NACL 50-0.9 MG/10ML-% IV SOSY
PREFILLED_SYRINGE | INTRAVENOUS | Status: DC | PRN
  Administered 2021-12-03 (×6): 5 mg via INTRAVENOUS

## 2021-12-03 MED ORDER — MIDAZOLAM HCL 2 MG/2ML IJ SOLN
INTRAMUSCULAR | Status: DC | PRN
  Administered 2021-12-03: 2 mg via INTRAVENOUS

## 2021-12-03 MED ORDER — PROPOFOL 10 MG/ML IV BOLUS
INTRAVENOUS | Status: DC | PRN
  Administered 2021-12-03: 100 mg via INTRAVENOUS

## 2021-12-03 MED ORDER — PROPOFOL 500 MG/50ML IV EMUL
INTRAVENOUS | Status: AC
  Filled 2021-12-03: qty 50

## 2021-12-03 NOTE — Transfer of Care (Signed)
Immediate Anesthesia Transfer of Care Note ? ?Patient: Brian [name] ? ?Procedure(s) Performed: COLONOSCOPY WITH PROPOFOL ?POLYPECTOMY ?BIOPSY ? ?Patient Location: PACU ? ?Anesthesia Type:General ? ?Level of Consciousness: awake, alert  and oriented ? ?Airway & Oxygen Therapy: Patient Spontanous Breathing and Patient connected to nasal cannula oxygen ? ?Post-op Assessment: Report given to RN, Post -op Vital signs reviewed and stable, Patient moving all extremities X 4 and Patient able to stick tongue midline ? ?Post vital signs: Reviewed ? ?Last Vitals:  ?Vitals Value Taken Time  ?BP 94/40   ?Temp 97.6   ?Pulse 65   ?Resp 15   ?SpO2 99   ? ? ?Last Pain:  ?Vitals:  ? 12/03/21 0809  ?TempSrc:   ?PainSc: 5   ?   ? ?Patients Stated Pain Goal: 6 (12/03/21 0757) ? ?Complications: No notable events documented. ?

## 2021-12-03 NOTE — Op Note (Addendum)
[facility] ?Patient Name: Brian [name] ?Procedure Date: 12/03/2021 7:57 AM ?MRN: [id number] ?Date of Birth: 01/09/1956 ?Attending MD: [name] ,  ?CSN: [id number] ?Age: 66 ?Admit Type: Outpatient ?Procedure:                Colonoscopy ?Indications:              Clinically significant diarrhea of unexplained  ?                          origin, Abnormal CT of the GI tract ?Providers:                [name], Charlsie Quest Theda Sers RN, RN, Kenney Houseman  ?                          [place] ?Referring MD:              ?Medicines:                Monitored Anesthesia Care ?Complications:            No immediate complications. ?Estimated Blood Loss:     Estimated blood loss: none. ?Procedure:                Pre-Anesthesia Assessment: ?                          - Prior to the procedure, a History and Physical  ?                          was performed, and patient medications, allergies  ?                          and sensitivities were reviewed. The patient's  ?                          tolerance of previous anesthesia was reviewed. ?                          - The risks and benefits of the procedure and the  ?                          sedation options and risks were discussed with the  ?                          patient. All questions were answered and informed  ?                          consent was obtained. ?                          - ASA Grade Assessment: II - A patient with mild  ?                          systemic disease. ?                          After obtaining informed consent, the colonoscope  ?  was passed under direct vision. Throughout the  ?                          procedure, the patient's blood pressure, pulse, and  ?                          oxygen saturations were monitored continuously. The  ?                          PCF-HQ190L ([id number]) scope was introduced through  ?                          the anus and advanced to the the terminal ileum.  ?                           The colonoscopy was performed without difficulty.  ?                          The patient tolerated the procedure well. The  ?                          quality of the bowel preparation was excellent. ?Scope In: 8:12:36 AM ?Scope Out: 8:41:58 AM ?Scope Withdrawal Time: 0 hours 22 minutes 7 seconds  ?Total Procedure Duration: 0 hours 29 minutes 22 seconds  ?Findings: ?     The perianal and digital rectal examinations were normal. ?     The terminal ileum appeared normal. ?     The cecum and appendiceal orifice appeared normal - carefl inspection  ?     was performed and no abnormalities correlated with CT scan alterations. ?     Three sessile polyps were found in the transverse colon and ascending  ?     colon. The polyps were 2 to 5 mm in size. These polyps were removed with  ?     a cold snare. Resection and retrieval were complete. ?     A 3 mm polyp was found in the rectum. The polyp was sessile. The polyp  ?     was removed with a cold snare. Resection and retrieval were complete. ?     A few small-mouthed diverticula were found in the sigmoid colon and  ?     descending colon. Biopsies for histology were taken from normal colon  ?     with a cold forceps from the right colon and left colon for evaluation  ?     of microscopic colitis. ?     Non-bleeding internal hemorrhoids were found during retroflexion. The  ?     hemorrhoids were small. ?Impression:               - The examined portion of the ileum was normal. ?                          - The cecum and appendiceal orifice are normal. ?                          - Three 2 to 5 mm polyps in the transverse colon  ?  and in the ascending colon, removed with a cold  ?                          snare. Resected and retrieved. ?                          - One 3 mm polyp in the rectum, removed with a cold  ?                          snare. Resected and retrieved. ?                          - Diverticulosis in the sigmoid colon and in the  ?                           descending colon. Biopsied normal colon. ?                          - Non-bleeding internal hemorrhoids. ?Moderate Sedation: ?     Per Anesthesia Care ?Recommendation:           - Discharge patient to home (ambulatory). ?                          - Resume previous diet. ?                          - Await pathology results. ?                          - Repeat colonoscopy for surveillance based on  ?                          pathology results. ?Procedure Code(s):        --- Professional --- ?                          [phone], Colonoscopy, flexible; with removal of  ?                          tumor(s), polyp(s), or other lesion(s) by snare  ?                          technique ?                          45380, 59, Colonoscopy, flexible; with biopsy,  ?                          single or multiple ?Diagnosis Code(s):        --- Professional --- ?                          D63.8, Other hemorrhoids ?                          K63.5, Polyp of colon ?  K62.1, Rectal polyp ?                          R19.7, Diarrhea, unspecified ?                          K57.30, Diverticulosis of large intestine without  ?                          perforation or abscess without bleeding ?                          R93.3, Abnormal findings on diagnostic imaging of  ?                          other parts of digestive tract ?CPT copyright 2019 [employer]. All rights reserved. ?The codes documented in this report are preliminary and upon coder review may  ?be revised to meet current compliance requirements. ?[name], MD ?[name],  ?12/03/2021 8:50:52 AM ?This report has been signed electronically. ?Number of Addenda: 0 ?
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
## 2021-12-03 NOTE — Anesthesia Postprocedure Evaluation (Signed)
Anesthesia Post Note ? ?Patient: Benno Brensinger [place] ? ?Procedure(s) Performed: COLONOSCOPY WITH PROPOFOL ?POLYPECTOMY ?BIOPSY ? ?Patient location during evaluation: Endoscopy ?Anesthesia Type: General ?Level of consciousness: awake and alert and oriented ?Pain management: pain level controlled ?Vital Signs Assessment: post-procedure vital signs reviewed and stable ?Respiratory status: spontaneous breathing, nonlabored ventilation and respiratory function stable ?Cardiovascular status: blood pressure returned to baseline and stable ?Postop Assessment: no apparent nausea or vomiting ?Anesthetic complications: no ? ? ?No notable events documented. ? ? ?Last Vitals:  ?Vitals:  ? 12/03/21 0844 12/03/21 0848  ?BP:  103/65  ?Pulse: 66 69  ?Resp: 16 15  ?Temp: 36.5 ?C   ?SpO2: 98% 99%  ?  ?Last Pain:  ?Vitals:  ? 12/03/21 0848  ?TempSrc:   ?PainSc: 3   ? ? ?  ?  ?  ?  ?  ?  ? ?Doreene Forrey C Batsheva Stevick ? ? ? ? ?

## 2021-12-03 NOTE — Discharge Instructions (Signed)
You are being discharged to home.  Resume your previous diet.  We are waiting for your pathology results.  Your physician has recommended a repeat colonoscopy for surveillance based on pathology results.  

## 2021-12-04 LAB — SURGICAL PATHOLOGY

## 2021-12-08 ENCOUNTER — Encounter (INDEPENDENT_AMBULATORY_CARE_PROVIDER_SITE_OTHER): Payer: Self-pay | Admitting: *Deleted

## 2021-12-09 ENCOUNTER — Encounter (HOSPITAL_COMMUNITY): Payer: Self-pay | Admitting: Gastroenterology

## 2021-12-18 ENCOUNTER — Other Ambulatory Visit (INDEPENDENT_AMBULATORY_CARE_PROVIDER_SITE_OTHER): Payer: Self-pay | Admitting: Gastroenterology

## 2021-12-18 ENCOUNTER — Telehealth (INDEPENDENT_AMBULATORY_CARE_PROVIDER_SITE_OTHER): Payer: Self-pay

## 2021-12-18 DIAGNOSIS — R1032 Left lower quadrant pain: Secondary | ICD-10-CM

## 2021-12-18 MED ORDER — HYOSCYAMINE SULFATE 0.125 MG SL SUBL: 0.1250 mg | SUBLINGUAL_TABLET | Freq: Four times a day (QID) | SUBLINGUAL | 2 refills | Status: DC | PRN

## 2021-12-18 NOTE — Telephone Encounter (Signed)
Correction, sent hyoscyamine refill ?

## 2021-12-18 NOTE — Telephone Encounter (Signed)
Patient called today stating he has been having Left sided pain below belly button and also some middle abdominal pain. This has been on going for a while. He has a TCS on 12/03/2021. He says we told him what was not wrong with him, but never told him what was wrong with him. He has some nausea, no vomiting. Denies any fever dark or bloody stools, or fevers. Has a history of diverticulitis. Pain seems to be worsening and feels like some one is kneeing him in the stomach, and seems to worsen after eating . Please advise.  ?
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
## 2021-12-22 ENCOUNTER — Ambulatory Visit (INDEPENDENT_AMBULATORY_CARE_PROVIDER_SITE_OTHER): Payer: Medicare HMO | Admitting: Gastroenterology

## 2021-12-22 ENCOUNTER — Encounter (INDEPENDENT_AMBULATORY_CARE_PROVIDER_SITE_OTHER): Payer: Self-pay | Admitting: Gastroenterology

## 2021-12-22 VITALS — BP 121/68 | HR 80 | Temp 98.2°F | Ht 72.0 in | Wt 194.4 lb

## 2021-12-22 DIAGNOSIS — R109 Unspecified abdominal pain: Secondary | ICD-10-CM

## 2021-12-22 DIAGNOSIS — Z79891 Long term (current) use of opiate analgesic: Secondary | ICD-10-CM | POA: Diagnosis not present

## 2021-12-22 MED ORDER — DICYCLOMINE HCL 20 MG PO TABS: 10.0000 mg | ORAL_TABLET | Freq: Three times a day (TID) | ORAL | 3 refills | Status: DC

## 2021-12-22 NOTE — Patient Instructions (Signed)
Patient was counseled about the benefit of implementing a low FODMAP to improve symptoms and recurrent episodes. A dietary list was provided to the patient. Also, the patient was counseled about the benefit of avoiding stressing situations and potential environmental triggers leading to symptomatology. ?Start Bentyl 10 mg every 8 h ?
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
## 2022-02-23 ENCOUNTER — Encounter: Payer: Self-pay | Admitting: *Deleted

## 2022-02-28 ENCOUNTER — Emergency Department (HOSPITAL_COMMUNITY)
Admission: EM | Admit: 2022-02-28 | Discharge: 2022-02-28 | Disposition: A | Discharge status: Home / Self Care | Payer: Medicare HMO | Attending: Emergency Medicine | Admitting: Emergency Medicine

## 2022-02-28 ENCOUNTER — Encounter (HOSPITAL_COMMUNITY): Payer: Self-pay | Admitting: *Deleted

## 2022-02-28 ENCOUNTER — Emergency Department (HOSPITAL_COMMUNITY): Payer: Medicare HMO

## 2022-02-28 ENCOUNTER — Other Ambulatory Visit: Payer: Self-pay

## 2022-02-28 DIAGNOSIS — R0602 Shortness of breath: Secondary | ICD-10-CM | POA: Diagnosis not present

## 2022-02-28 DIAGNOSIS — W57XXXA Bitten or stung by nonvenomous insect and other nonvenomous arthropods, initial encounter: Secondary | ICD-10-CM | POA: Insufficient documentation

## 2022-02-28 DIAGNOSIS — Z7982 Long term (current) use of aspirin: Secondary | ICD-10-CM | POA: Insufficient documentation

## 2022-02-28 DIAGNOSIS — R079 Chest pain, unspecified: Secondary | ICD-10-CM | POA: Diagnosis not present

## 2022-02-28 DIAGNOSIS — S40862A Insect bite (nonvenomous) of left upper arm, initial encounter: Secondary | ICD-10-CM | POA: Diagnosis not present

## 2022-02-28 DIAGNOSIS — Z79899 Other long term (current) drug therapy: Secondary | ICD-10-CM | POA: Insufficient documentation

## 2022-02-28 DIAGNOSIS — Z91038 Other insect allergy status: Secondary | ICD-10-CM

## 2022-02-28 DIAGNOSIS — S40861A Insect bite (nonvenomous) of right upper arm, initial encounter: Secondary | ICD-10-CM | POA: Insufficient documentation

## 2022-02-28 LAB — COMPREHENSIVE METABOLIC PANEL
ALT: 19 U/L (ref 0–44)
AST: 18 U/L (ref 15–41)
Albumin: 4.5 g/dL (ref 3.5–5.0)
Alkaline Phosphatase: 78 U/L (ref 38–126)
Anion gap: 12 (ref 5–15)
BUN: 28 mg/dL — ABNORMAL HIGH (ref 8–23)
CO2: 19 mmol/L — ABNORMAL LOW (ref 22–32)
Calcium: 10 mg/dL (ref 8.9–10.3)
Chloride: 109 mmol/L (ref 98–111)
Creatinine, Ser: 1.68 mg/dL — ABNORMAL HIGH (ref 0.61–1.24)
GFR, Estimated: 45 mL/min — ABNORMAL LOW (ref >60)
Glucose, Bld: 150 mg/dL — ABNORMAL HIGH (ref 70–99)
Potassium: 3.9 mmol/L (ref 3.5–5.1)
Sodium: 140 mmol/L (ref 135–145)
Total Bilirubin: 0.3 mg/dL (ref 0.3–1.2)
Total Protein: 7.8 g/dL (ref 6.5–8.1)

## 2022-02-28 LAB — TROPONIN I (HIGH SENSITIVITY)
Troponin I (High Sensitivity): 5 ng/L (ref <18)
Troponin I (High Sensitivity): 4 ng/L (ref <18)

## 2022-02-28 LAB — CBC WITH DIFFERENTIAL/PLATELET
Abs Immature Granulocytes: 0.03 10*3/uL (ref 0.00–0.07)
Basophils Absolute: 0.1 10*3/uL (ref 0.0–0.1)
Basophils Relative: 1 %
Eosinophils Absolute: 0.5 10*3/uL (ref 0.0–0.5)
Eosinophils Relative: 4 %
HCT: 41.7 % (ref 39.0–52.0)
Hemoglobin: 13.9 g/dL (ref 13.0–17.0)
Immature Granulocytes: 0 %
Lymphocytes Relative: 23 %
Lymphs Abs: 2.8 10*3/uL (ref 0.7–4.0)
MCH: 32.3 pg (ref 26.0–34.0)
MCHC: 33.3 g/dL (ref 30.0–36.0)
MCV: 97 fL (ref 80.0–100.0)
Monocytes Absolute: 1 10*3/uL (ref 0.1–1.0)
Monocytes Relative: 8 %
Neutro Abs: 7.9 10*3/uL — ABNORMAL HIGH (ref 1.7–7.7)
Neutrophils Relative %: 64 %
Platelets: 208 10*3/uL (ref 150–400)
RBC: 4.3 MIL/uL (ref 4.22–5.81)
RDW: 13.7 % (ref 11.5–15.5)
WBC: 12.2 10*3/uL — ABNORMAL HIGH (ref 4.0–10.5)
nRBC: 0 % (ref 0.0–0.2)

## 2022-02-28 LAB — BRAIN NATRIURETIC PEPTIDE: B Natriuretic Peptide: 51 pg/mL (ref 0.0–100.0)

## 2022-02-28 MED ORDER — DIPHENHYDRAMINE HCL 50 MG/ML IJ SOLN
25.0000 mg | Freq: Once | INTRAMUSCULAR | Status: AC
  Administered 2022-02-28: 25 mg via INTRAVENOUS
  Filled 2022-02-28: qty 1

## 2022-02-28 MED ORDER — FAMOTIDINE IN NACL 20-0.9 MG/50ML-% IV SOLN
20.0000 mg | Freq: Once | INTRAVENOUS | Status: AC
  Administered 2022-02-28: 20 mg via INTRAVENOUS
  Filled 2022-02-28: qty 50

## 2022-02-28 MED ORDER — ASPIRIN 81 MG PO CHEW
324.0000 mg | CHEWABLE_TABLET | Freq: Once | ORAL | Status: AC
  Administered 2022-02-28: 324 mg via ORAL
  Filled 2022-02-28: qty 4

## 2022-02-28 MED ORDER — METHYLPREDNISOLONE SODIUM SUCC 125 MG IJ SOLR
125.0000 mg | Freq: Once | INTRAMUSCULAR | Status: AC
Start: 2022-02-28 — End: 2022-02-28
  Administered 2022-02-28: 125 mg via INTRAVENOUS
  Filled 2022-02-28: qty 2

## 2022-02-28 MED ORDER — SODIUM CHLORIDE 0.9 % IV BOLUS
1000.0000 mL | Freq: Once | INTRAVENOUS | Status: AC
  Administered 2022-02-28: 1000 mL via INTRAVENOUS
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
## 2022-02-28 NOTE — Discharge Instructions (Signed)
You were seen in the emergency department for some chest pain shortness of breath in the setting of an insect sting.  You had lab work chest x-ray and an EKG that did not show any evidence of heart injury.  You did look a little dehydrated.  Please drink plenty of fluids and rest.  You can continue to use Benadryl as needed.  Follow-up with your regular doctor.  Return to the emergency department if any worsening or concerning symptoms.

## 2022-02-28 NOTE — ED Triage Notes (Signed)
Pt c/o chest pain and SOB that started 20 minutes ago. He reports he feels like an elephant is sitting on his chest. He was stung by several bees earlier this morning and has swelling to his right upper arm, left elbow and left hand.

## 2022-04-24 ENCOUNTER — Emergency Department (HOSPITAL_COMMUNITY)
Admission: EM | Admit: 2022-04-24 | Discharge: 2022-04-24 | Disposition: A | Discharge status: Home / Self Care | Payer: Medicare HMO | Attending: Emergency Medicine | Admitting: Emergency Medicine

## 2022-04-24 ENCOUNTER — Encounter (HOSPITAL_COMMUNITY): Payer: Self-pay

## 2022-04-24 ENCOUNTER — Emergency Department (HOSPITAL_COMMUNITY): Payer: Medicare HMO

## 2022-04-24 DIAGNOSIS — Z7982 Long term (current) use of aspirin: Secondary | ICD-10-CM | POA: Diagnosis not present

## 2022-04-24 DIAGNOSIS — G8929 Other chronic pain: Secondary | ICD-10-CM | POA: Diagnosis not present

## 2022-04-24 DIAGNOSIS — Z20822 Contact with and (suspected) exposure to covid-19: Secondary | ICD-10-CM | POA: Diagnosis not present

## 2022-04-24 DIAGNOSIS — Z79899 Other long term (current) drug therapy: Secondary | ICD-10-CM | POA: Insufficient documentation

## 2022-04-24 DIAGNOSIS — R1084 Generalized abdominal pain: Secondary | ICD-10-CM | POA: Insufficient documentation

## 2022-04-24 DIAGNOSIS — R197 Diarrhea, unspecified: Secondary | ICD-10-CM | POA: Diagnosis not present

## 2022-04-24 LAB — RESP PANEL BY RT-PCR (FLU A&B, COVID) ARPGX2
Influenza A by PCR: NEGATIVE
Influenza B by PCR: NEGATIVE
SARS Coronavirus 2 by RT PCR: NEGATIVE

## 2022-04-24 LAB — COMPREHENSIVE METABOLIC PANEL
ALT: 18 U/L (ref 0–44)
AST: 16 U/L (ref 15–41)
Albumin: 4.3 g/dL (ref 3.5–5.0)
Alkaline Phosphatase: 69 U/L (ref 38–126)
Anion gap: 7 (ref 5–15)
BUN: 31 mg/dL — ABNORMAL HIGH (ref 8–23)
CO2: 22 mmol/L (ref 22–32)
Calcium: 9.7 mg/dL (ref 8.9–10.3)
Chloride: 113 mmol/L — ABNORMAL HIGH (ref 98–111)
Creatinine, Ser: 1.9 mg/dL — ABNORMAL HIGH (ref 0.61–1.24)
GFR, Estimated: 38 mL/min — ABNORMAL LOW (ref >60)
Glucose, Bld: 195 mg/dL — ABNORMAL HIGH (ref 70–99)
Potassium: 3.8 mmol/L (ref 3.5–5.1)
Sodium: 142 mmol/L (ref 135–145)
Total Bilirubin: 1 mg/dL (ref 0.3–1.2)
Total Protein: 7.6 g/dL (ref 6.5–8.1)

## 2022-04-24 LAB — CBC
HCT: 44.1 % (ref 39.0–52.0)
Hemoglobin: 14.7 g/dL (ref 13.0–17.0)
MCH: 32.7 pg (ref 26.0–34.0)
MCHC: 33.3 g/dL (ref 30.0–36.0)
MCV: 98 fL (ref 80.0–100.0)
Platelets: 202 10*3/uL (ref 150–400)
RBC: 4.5 MIL/uL (ref 4.22–5.81)
RDW: 13.1 % (ref 11.5–15.5)
WBC: 9.2 10*3/uL (ref 4.0–10.5)
nRBC: 0 % (ref 0.0–0.2)

## 2022-04-24 LAB — DIFFERENTIAL
Abs Immature Granulocytes: 0.04 10*3/uL (ref 0.00–0.07)
Basophils Absolute: 0.1 10*3/uL (ref 0.0–0.1)
Basophils Relative: 1 %
Eosinophils Absolute: 0.2 10*3/uL (ref 0.0–0.5)
Eosinophils Relative: 3 %
Immature Granulocytes: 0 %
Lymphocytes Relative: 18 %
Lymphs Abs: 1.7 10*3/uL (ref 0.7–4.0)
Monocytes Absolute: 0.8 10*3/uL (ref 0.1–1.0)
Monocytes Relative: 8 %
Neutro Abs: 6.4 10*3/uL (ref 1.7–7.7)
Neutrophils Relative %: 70 %

## 2022-04-24 LAB — CBG MONITORING, ED: Glucose-Capillary: 167 mg/dL — ABNORMAL HIGH (ref 70–99)

## 2022-04-24 LAB — TYPE AND SCREEN
ABO/RH(D): A POS
Antibody Screen: NEGATIVE

## 2022-04-24 LAB — LIPASE, BLOOD: Lipase: 22 U/L (ref 11–51)

## 2022-04-24 LAB — POC OCCULT BLOOD, ED: Fecal Occult Bld: NEGATIVE

## 2022-04-24 MED ORDER — HYDROMORPHONE HCL 1 MG/ML IJ SOLN
2.0000 mg | Freq: Once | INTRAMUSCULAR | Status: DC
  Filled 2022-04-24: qty 2

## 2022-04-24 MED ORDER — IOHEXOL 300 MG/ML  SOLN
75.0000 mL | Freq: Once | INTRAMUSCULAR | Status: AC | PRN
  Administered 2022-04-24: 75 mL via INTRAVENOUS

## 2022-04-24 MED ORDER — HYDROMORPHONE HCL 1 MG/ML IJ SOLN
2.0000 mg | Freq: Once | INTRAMUSCULAR | Status: AC
  Administered 2022-04-24: 2 mg via INTRAVENOUS

## 2022-04-24 NOTE — Discharge Instructions (Signed)
Make an appointment to follow-up with your GI doctor.  Also follow-up with pain management.  Today's work-up without any acute findings.

## 2022-04-24 NOTE — ED Triage Notes (Addendum)
Pt to ED via POV c/o abdominal pain and "shakes" x 2 months , progressively getting worse. Last BM today, reports ongoing diarrhea x 2 months

## 2022-04-24 NOTE — ED Notes (Signed)
Pt given urinal and informed of need for urine sample  

## 2022-04-24 NOTE — ED Notes (Signed)
Patient transported to CT 

## 2022-04-24 NOTE — ED Provider Triage Note (Signed)
Emergency Medicine Provider Triage Evaluation Note  Brian [name] , a 66 y.o. male  was evaluated in triage.  Pt complains of lower abdominal pain, nausea, and black stools for the past 2 months. He reports that he can't get into his GI doc till October.  Review of Systems  Positive:  Negative:   Physical Exam  BP (!) 120/104 (BP Location: Right Arm)   Pulse 75   Temp 99.4 F (37.4 C) (Oral)   Resp 20   Ht 6' (1.829 m)   Wt 80.7 kg   SpO2 99%   BMI 24.14 kg/m  Gen:   Awake, no distress   Resp:  Normal effort  MSK:   Moves extremities without difficulty  Other:  Diffuse abdominal tenderness. No guarding or rebound.   Medical Decision Making  Medically screening exam initiated at 5:30 PM.  Appropriate orders placed.  Brian [name] was informed that the remainder of the evaluation will be completed by another provider, this initial triage assessment does not replace that evaluation, and the importance of remaining in the ED until their evaluation is complete.  Will order CT imaging and labs   Sherrell Puller, [place] 04/24/22 1731

## 2022-04-24 NOTE — ED Provider Notes (Signed)
[facility] EMERGENCY DEPARTMENT Provider Note   CSN: [id number] Arrival date & time: 04/24/22  1616     History  Chief Complaint  Patient presents with   Abdominal Pain   Diarrhea    Brian [name] is a 66 y.o. male.  Patient with a history of chronic abdominal pain has been seen by gastroenterology.  Saw them in April.  Seen by Dr. Molli Hazard, they think it is functional.  They do not have a significant answer.  Patient is also followed by pain management.  Patient states here that he has had abdominal pain and that this been getting progressively worse has been ongoing for 2 months.  Also reports some diarrhea.  Apparently patient is out of his chronic pain medicine not able to get more until September.  Past medical history officially says irritable bowel syndrome gastroparesis chronic diarrhea hyperlipidemia type 2 diabetes.       Home Medications Prior to Admission medications   Medication Sig Start Date End Date Taking? Authorizing Provider  amLODipine (NORVASC) 5 MG tablet Take 5 mg by mouth daily.  03/17/16   [provider]  aspirin EC 81 MG tablet Take 1 tablet (81 mg total) by mouth daily. 06/12/21   Rehman, Mechele Dawley, MD  azelastine (ASTELIN) 0.1 % nasal spray Place 1 spray into both nostrils daily. 09/22/18   [provider]  Cholecalciferol (VITAMIN D3 PO) Take 2 tablets by mouth 2 (two) times daily.    [provider]  diazepam (VALIUM) 5 MG tablet Take 5 mg by mouth 2 (two) times daily as needed. 01/29/22   [provider]  dicyclomine (BENTYL) 20 MG tablet Take 0.5 tablets (10 mg total) by mouth 3 (three) times daily before meals. 12/22/21   Harvel Quale, MD  diphenhydrAMINE (BENADRYL) 25 MG tablet Take 25 mg by mouth in the morning and at bedtime.     [provider]  DULoxetine (CYMBALTA) 60 MG capsule Take 60 mg by mouth daily.    [provider]  famotidine (PEPCID) 40 MG tablet TAKE 1 TABLET TWICE  DAILY 09/26/21   Carlan, Chelsea L, NP  fluticasone (FLONASE) 50 MCG/ACT nasal spray Place 1 spray into both nostrils daily.    [provider]  gabapentin (NEURONTIN) 800 MG tablet Take 800 mg by mouth 4 (four) times daily.     [provider]  hyoscyamine (LEVSIN SL) 0.125 MG SL tablet Place 1 tablet (0.125 mg total) under the tongue every 6 (six) hours as needed (abdominal pain). 12/18/21   Harvel Quale, MD  ketoconazole (NIZORAL) 2 % cream Apply 1 application topically daily as needed for irritation.  03/30/16   [provider]  losartan (COZAAR) 100 MG tablet Take 100 mg by mouth daily.  06/27/20   [provider]  MILK THISTLE PO Take 1,000 mg by mouth daily.    [provider]  Multiple Minerals (CALCIUM-MAGNESIUM-ZINC) TABS Take 1 tablet by mouth daily.    [provider]  Multiple Vitamin (MULTIVITAMIN WITH MINERALS) TABS tablet Take 1 tablet by mouth daily.    [provider]  Omega-3 Fatty Acids (FISH OIL) 1000 MG CAPS Take 1,000 mg by mouth daily.     [provider]  ondansetron (ZOFRAN) 8 MG tablet TAKE 1 TABLET TWICE DAILY AS NEEDED FOR NAUSEA AND VOMITING Patient taking differently: Take 8 mg by mouth 2 (two) times daily as needed for nausea or vomiting. 04/05/20   Mahala Menghini, PA-C  ondansetron (ZOFRAN-ODT) 4 MG disintegrating tablet Take 4 mg by mouth every 8 (eight) hours as needed for nausea/vomiting. 10/02/21   [provider]  Oxycodone HCl 10 MG TABS Take 10 mg by mouth every 8 (eight) hours as needed (pain).    [provider]  Polyethyl Glycol-Propyl Glycol (SYSTANE OP) Place 1 drop into both eyes daily as needed (dry eyes).    [provider]  simvastatin (ZOCOR) 80 MG tablet Take 80 mg by mouth every evening. 12/28/13   [provider]  tamsulosin (FLOMAX) 0.4 MG CAPS capsule Take 0.4 mg by mouth 2 (two) times daily. 10/08/21   [provider]   topiramate (TOPAMAX) 100 MG tablet Take 200 mg by mouth 2 (two) times daily.    [provider]  vitamin B-12 (CYANOCOBALAMIN) 1000 MCG tablet Take 1,000 mcg by mouth daily.    [provider]  vitamin C (ASCORBIC ACID) 500 MG tablet Take 500 mg by mouth daily.    [provider]  vitamin E 400 UNIT capsule Take 800 Units by mouth 2 (two) times daily.    [provider]      Allergies    Ketorolac tromethamine, Nsaids, Tylenol [acetaminophen], Doxycycline, Sulfamethoxazole, and Tetracyclines & related    Review of Systems   Review of Systems  Constitutional:  Negative for chills and fever.  HENT:  Negative for ear pain and sore throat.   Eyes:  Negative for pain and visual disturbance.  Respiratory:  Negative for cough and shortness of breath.   Cardiovascular:  Negative for chest pain and palpitations.  Gastrointestinal:  Positive for abdominal pain and diarrhea. Negative for vomiting.  Genitourinary:  Negative for dysuria and hematuria.  Musculoskeletal:  Negative for arthralgias and back pain.  Skin:  Negative for color change and rash.  Neurological:  Negative for seizures and syncope.  All other systems reviewed and are negative.   Physical Exam Updated Vital Signs BP 120/70 (BP Location: Left Arm)   Pulse 70   Temp 99.4 F (37.4 C) (Oral)   Resp 19   Ht 1.829 m (6')   Wt 80.7 kg   SpO2 97%   BMI 24.14 kg/m  Physical Exam Vitals and nursing note reviewed.  Constitutional:      General: He is not in acute distress.    Appearance: He is well-developed.  HENT:     Head: Normocephalic and atraumatic.  Eyes:     Conjunctiva/sclera: Conjunctivae normal.  Cardiovascular:     Rate and Rhythm: Normal rate and regular rhythm.     Heart sounds: No murmur heard. Pulmonary:     Effort: Pulmonary effort is normal. No respiratory distress.     Breath sounds: Normal breath sounds.  Abdominal:     General: Abdomen is flat.     Palpations:  Abdomen is soft.     Tenderness: There is generalized abdominal tenderness (.). There is no guarding.  Musculoskeletal:        General: No swelling.     Cervical back: Neck supple.  Skin:    General: Skin is warm and dry.     Capillary Refill: Capillary refill takes less than 2 seconds.  Neurological:     General: No focal deficit present.     Mental Status: He is alert and oriented to person, place, and time.  Psychiatric:        Mood and Affect: Mood normal.     ED Results / Procedures / Treatments  Labs (all labs ordered are listed, but only abnormal results are displayed) Labs Reviewed  COMPREHENSIVE METABOLIC PANEL - Abnormal; Notable for the following components:      Result Value   Chloride 113 (*)    Glucose, Bld 195 (*)    BUN 31 (*)    Creatinine, Ser 1.90 (*)    GFR, Estimated 38 (*)    All other components within normal limits  CBG MONITORING, ED - Abnormal; Notable for the following components:   Glucose-Capillary 167 (*)    All other components within normal limits  RESP PANEL BY RT-PCR (FLU A&B, COVID) ARPGX2  LIPASE, BLOOD  CBC  DIFFERENTIAL  URINALYSIS, ROUTINE W REFLEX MICROSCOPIC  POC OCCULT BLOOD, ED  TYPE AND SCREEN    EKG None  Radiology CT ABDOMEN PELVIS W CONTRAST  Result Date: 04/24/2022 CLINICAL DATA:  Acute abdominal pain. EXAM: CT ABDOMEN AND PELVIS WITH CONTRAST TECHNIQUE: Multidetector CT imaging of the abdomen and pelvis was performed using the standard protocol following bolus administration of intravenous contrast. RADIATION DOSE REDUCTION: This exam was performed according to the departmental dose-optimization program which includes automated exposure control, adjustment of the mA and/or kV according to patient size and/or use of iterative reconstruction technique. CONTRAST:  33m OMNIPAQUE IOHEXOL 300 MG/ML  SOLN COMPARISON:  CT abdomen and pelvis 10/17/2021 FINDINGS: Lower chest: No acute abnormality. Hepatobiliary: No focal liver  abnormality is seen. Status post cholecystectomy. No biliary dilatation. Pancreas: Unremarkable. No pancreatic ductal dilatation or surrounding inflammatory changes. Spleen: Normal in size without focal abnormality. Adrenals/Urinary Tract: There are rounded cortical hypodensities in both kidneys which are too small to characterize, likely cysts. Largest measures 15 mm in the left kidney. There is no hydronephrosis or perinephric fluid. The adrenal glands and bladder are within normal limits. Stomach/Bowel: Stomach is within normal limits. Appendix appears normal. No dilated bowel loops are seen. There is sigmoid colon diverticulosis without evidence for acute diverticulitis. There is rectosigmoid wall thickening versus normal under distension. There is no surrounding inflammation. Vascular/Lymphatic: Aortic atherosclerosis. No enlarged abdominal or pelvic lymph nodes. Reproductive: Prostate gland is enlarged. Other: No abdominal wall hernia or abnormality. No abdominopelvic ascites. Musculoskeletal: No acute or significant osseous findings. IMPRESSION: 1. Mild rectosigmoid colon wall thickening versus normal under distension. No surrounding inflammation. Correlate clinically for proctocolitis. 2. Colonic diverticulosis without evidence for diverticulitis. 3. Renal hypodensities are likely cysts. No follow-up imaging is recommended. JACR 2018 Feb; 264-273, Management of the Incidental RenalMass on CT, [employer] 2021; 814-848, Bosniak Classification of Cystic Renal Masses, Version 2019. 4. Prostatomegaly. 5.  Aortic Atherosclerosis (ICD10-I70.0). Electronically Signed   By: ARonney AstersM.D.   On: 04/24/2022 19:18    Procedures Procedures    Medications Ordered in ED Medications  HYDROmorphone (DILAUDID) injection 2 mg (has no administration in time range)  iohexol (OMNIPAQUE) 300 MG/ML solution 75 mL (75 mLs Intravenous Contrast Given 04/24/22 1904)    ED Course/ Medical Decision Making/ A&P                            Medical Decision Making Amount and/or Complexity of Data Reviewed Labs: ordered.  Risk Prescription drug management.   Work-up here CT scan may be a little bit inflammation in the rectal area but there is no leukocytosis says no fever White count is 9.2 hemoglobin is good differential is good the blood sugar is good at 1242complete metabolic panel GFR is 38 but that chronic  kidney disease is baseline.  Not significantly worse.  And patient's liver function test are normal electrolytes are normal.  Lipase also normal at 22.  Patient can follow-up with pain management follow back up with gastroenterology.  Patient will be given 1 dose of hydromorphone here.     Final Clinical Impression(s) / ED Diagnoses Final diagnoses:  Chronic abdominal pain    Rx / DC Orders ED Discharge Orders     None         Fredia Sorrow, MD 04/24/22 2023

## 2022-04-25 ENCOUNTER — Emergency Department (HOSPITAL_COMMUNITY)
Admission: EM | Admit: 2022-04-25 | Discharge: 2022-04-25 | Disposition: A | Discharge status: Home / Self Care | Payer: Medicare HMO | Attending: Emergency Medicine | Admitting: Emergency Medicine

## 2022-04-25 ENCOUNTER — Encounter (HOSPITAL_COMMUNITY): Payer: Self-pay | Admitting: Emergency Medicine

## 2022-04-25 ENCOUNTER — Other Ambulatory Visit: Payer: Self-pay

## 2022-04-25 DIAGNOSIS — Z7982 Long term (current) use of aspirin: Secondary | ICD-10-CM | POA: Diagnosis not present

## 2022-04-25 DIAGNOSIS — R39198 Other difficulties with micturition: Secondary | ICD-10-CM | POA: Diagnosis present

## 2022-04-25 DIAGNOSIS — K5130 Ulcerative (chronic) rectosigmoiditis without complications: Secondary | ICD-10-CM | POA: Diagnosis not present

## 2022-04-25 DIAGNOSIS — R300 Dysuria: Secondary | ICD-10-CM

## 2022-04-25 DIAGNOSIS — K529 Noninfective gastroenteritis and colitis, unspecified: Secondary | ICD-10-CM

## 2022-04-25 LAB — URINALYSIS, ROUTINE W REFLEX MICROSCOPIC
Bilirubin Urine: NEGATIVE
Glucose, UA: NEGATIVE mg/dL
Hgb urine dipstick: NEGATIVE
Ketones, ur: NEGATIVE mg/dL
Leukocytes,Ua: NEGATIVE
Nitrite: NEGATIVE
Protein, ur: NEGATIVE mg/dL
Specific Gravity, Urine: 1.006 (ref 1.005–1.030)
pH: 6 (ref 5.0–8.0)

## 2022-04-25 MED ORDER — CIPROFLOXACIN HCL 250 MG PO TABS
500.0000 mg | ORAL_TABLET | Freq: Once | ORAL | Status: AC
Start: 2022-04-25 — End: 2022-04-25
  Administered 2022-04-25: 500 mg via ORAL
  Filled 2022-04-25: qty 2

## 2022-04-25 MED ORDER — OXYCODONE HCL 10 MG PO TABS: 10.0000 mg | ORAL_TABLET | Freq: Four times a day (QID) | ORAL | 0 refills | Status: AC | PRN

## 2022-04-25 MED ORDER — OXYCODONE HCL 5 MG PO TABS
10.0000 mg | ORAL_TABLET | Freq: Once | ORAL | Status: AC
  Administered 2022-04-25: 10 mg via ORAL
  Filled 2022-04-25: qty 2

## 2022-04-25 MED ORDER — METRONIDAZOLE 500 MG PO TABS
500.0000 mg | ORAL_TABLET | Freq: Once | ORAL | Status: AC
Start: 2022-04-25 — End: 2022-04-25
  Administered 2022-04-25: 500 mg via ORAL
  Filled 2022-04-25: qty 1

## 2022-04-25 MED ORDER — CIPROFLOXACIN HCL 500 MG PO TABS: 500.0000 mg | ORAL_TABLET | Freq: Two times a day (BID) | ORAL | 0 refills | Status: AC

## 2022-04-25 MED ORDER — METRONIDAZOLE 500 MG PO TABS: 500.0000 mg | ORAL_TABLET | Freq: Two times a day (BID) | ORAL | 0 refills | Status: AC

## 2022-04-25 NOTE — ED Notes (Signed)
Dr. Langston Masker notified that pt was able to urinate approximately 571m of urine. Hold foley catheter insertion at this time.

## 2022-04-25 NOTE — ED Triage Notes (Signed)
Pt to the ED with complaints of urinary retention after being seen here last night. Pt states ha has only urinated 10 ounces since leaving the ED last night.

## 2022-04-25 NOTE — ED Provider Notes (Signed)
[facility] EMERGENCY DEPARTMENT Provider Note   CSN: [id number] Arrival date & time: 04/25/22  1237     History  Chief Complaint  Patient presents with   Urinary Retention    Brian [name] is a 66 y.o. male.  Patient is a 66 year old male presenting for complaints of difficulty urinating, dysuria, and pubic abdominal pain.  Review demonstrates patient was seen last night and had CT abdomen and pelvis demonstrating proctocolitis.  Patient is unaware of this diagnosis at this time.  Patient was able to put over 500 mL of urine out on his own.  UA demonstrates no urinary tract infection.  Patient denies history of sex with other men or anal penetration.  Will cover with antibiotics for proctocolitis and send pain medications over to pharmacy.  The history is provided by the patient. No language interpreter was used.       Home Medications Prior to Admission medications   Medication Sig Start Date End Date Taking? Authorizing Provider  ciprofloxacin (CIPRO) 500 MG tablet Take 1 tablet (500 mg total) by mouth every 12 (twelve) hours for 7 days. 04/25/22 05/02/41 Yes Campbell Stall P, DO  metroNIDAZOLE (FLAGYL) 500 MG tablet Take 1 tablet (500 mg total) by mouth 2 (two) times daily for 7 days. 04/25/22 02/05/33 Yes Campbell Stall P, DO  oxyCODONE 10 MG TABS Take 1 tablet (10 mg total) by mouth every 6 (six) hours as needed for up to 3 days for severe pain. 04/25/22 1/96/22 Yes Campbell Stall P, DO  amLODipine (NORVASC) 5 MG tablet Take 5 mg by mouth daily.  03/17/16   [provider]  aspirin EC 81 MG tablet Take 1 tablet (81 mg total) by mouth daily. 06/12/21   Rehman, Mechele Dawley, MD  azelastine (ASTELIN) 0.1 % nasal spray Place 1 spray into both nostrils daily. 09/22/18   [provider]  Cholecalciferol (VITAMIN D3 PO) Take 2 tablets by mouth 2 (two) times daily.    [provider]  diazepam (VALIUM) 5 MG tablet Take 5 mg by mouth 2 (two) times daily as needed. 01/29/22    [provider]  dicyclomine (BENTYL) 20 MG tablet Take 0.5 tablets (10 mg total) by mouth 3 (three) times daily before meals. 12/22/21   Harvel Quale, MD  diphenhydrAMINE (BENADRYL) 25 MG tablet Take 25 mg by mouth in the morning and at bedtime.     [provider]  DULoxetine (CYMBALTA) 60 MG capsule Take 60 mg by mouth daily.    [provider]  famotidine (PEPCID) 40 MG tablet TAKE 1 TABLET TWICE DAILY 09/26/21   Carlan, Chelsea L, NP  fluticasone (FLONASE) 50 MCG/ACT nasal spray Place 1 spray into both nostrils daily.    [provider]  gabapentin (NEURONTIN) 800 MG tablet Take 800 mg by mouth 4 (four) times daily.     [provider]  hyoscyamine (LEVSIN SL) 0.125 MG SL tablet Place 1 tablet (0.125 mg total) under the tongue every 6 (six) hours as needed (abdominal pain). 12/18/21   Harvel Quale, MD  ketoconazole (NIZORAL) 2 % cream Apply 1 application topically daily as needed for irritation.  03/30/16   [provider]  losartan (COZAAR) 100 MG tablet Take 100 mg by mouth daily.  06/27/20   [provider]  MILK THISTLE PO Take 1,000 mg by mouth daily.    [provider]  Multiple Minerals (CALCIUM-MAGNESIUM-ZINC) TABS Take 1 tablet by mouth daily.    [provider]  Multiple Vitamin (MULTIVITAMIN WITH MINERALS) TABS tablet Take 1 tablet by mouth daily.    [provider]  Omega-3 Fatty Acids (FISH OIL) 1000 MG CAPS Take 1,000 mg by mouth daily.     [provider]  ondansetron (ZOFRAN) 8 MG tablet TAKE 1 TABLET TWICE DAILY AS NEEDED FOR NAUSEA AND VOMITING Patient taking differently: Take 8 mg by mouth 2 (two) times daily as needed for nausea or vomiting. 04/05/20   Mahala Menghini, PA-C  ondansetron (ZOFRAN-ODT) 4 MG disintegrating tablet Take 4 mg by mouth every 8 (eight) hours as needed for nausea/vomiting. 10/02/21   [provider]  Polyethyl Glycol-Propyl  Glycol (SYSTANE OP) Place 1 drop into both eyes daily as needed (dry eyes).    [provider]  simvastatin (ZOCOR) 80 MG tablet Take 80 mg by mouth every evening. 12/28/13   [provider]  tamsulosin (FLOMAX) 0.4 MG CAPS capsule Take 0.4 mg by mouth 2 (two) times daily. 10/08/21   [provider]  topiramate (TOPAMAX) 100 MG tablet Take 200 mg by mouth 2 (two) times daily.    [provider]  vitamin B-12 (CYANOCOBALAMIN) 1000 MCG tablet Take 1,000 mcg by mouth daily.    [provider]  vitamin C (ASCORBIC ACID) 500 MG tablet Take 500 mg by mouth daily.    [provider]  vitamin E 400 UNIT capsule Take 800 Units by mouth 2 (two) times daily.    [provider]      Allergies    Ketorolac tromethamine, Nsaids, Tylenol [acetaminophen], Doxycycline, Sulfamethoxazole, and Tetracyclines & related    Review of Systems   Review of Systems  Constitutional:  Negative for chills and fever.  HENT:  Negative for ear pain and sore throat.   Eyes:  Negative for pain and visual disturbance.  Respiratory:  Negative for cough and shortness of breath.   Cardiovascular:  Negative for chest pain and palpitations.  Gastrointestinal:  Negative for abdominal pain and vomiting.  Genitourinary:  Positive for difficulty urinating and dysuria. Negative for hematuria.  Musculoskeletal:  Negative for arthralgias and back pain.  Skin:  Negative for color change and rash.  Neurological:  Negative for seizures and syncope.  All other systems reviewed and are negative.   Physical Exam Updated Vital Signs BP 111/68 (BP Location: Left Arm)   Pulse 64   Temp 98.9 F (37.2 C) (Oral)   Resp 16   Ht 6' (1.829 m)   Wt 80.7 kg   SpO2 100%   BMI 24.14 kg/m  Physical Exam Vitals and nursing note reviewed.  Constitutional:      General: Brian [name] is not in acute distress.    Appearance: Brian [name] is well-developed.  HENT:     Head: Normocephalic and atraumatic.   Eyes:     Conjunctiva/sclera: Conjunctivae normal.  Cardiovascular:     Rate and Rhythm: Normal rate and regular rhythm.     Heart sounds: No murmur heard. Pulmonary:     Effort: Pulmonary effort is normal. No respiratory distress.     Breath sounds: Normal breath sounds.  Abdominal:     Palpations: Abdomen is soft.     Tenderness: There is no abdominal tenderness.  Musculoskeletal:        General: No swelling.     Cervical back: Neck supple.  Skin:    General: Skin is warm and dry.     Capillary Refill: Capillary refill takes less than 2 seconds.  Neurological:  Mental Status: Brian [name] is alert.  Psychiatric:        Mood and Affect: Mood normal.     ED Results / Procedures / Treatments   Labs (all labs ordered are listed, but only abnormal results are displayed) Labs Reviewed  URINALYSIS, ROUTINE W REFLEX MICROSCOPIC - Abnormal; Notable for the following components:      Result Value   Color, Urine STRAW (*)    All other components within normal limits    EKG None  Radiology CT ABDOMEN PELVIS W CONTRAST  Result Date: 04/24/2022 CLINICAL DATA:  Acute abdominal pain. EXAM: CT ABDOMEN AND PELVIS WITH CONTRAST TECHNIQUE: Multidetector CT imaging of the abdomen and pelvis was performed using the standard protocol following bolus administration of intravenous contrast. RADIATION DOSE REDUCTION: This exam was performed according to the departmental dose-optimization program which includes automated exposure control, adjustment of the mA and/or kV according to patient size and/or use of iterative reconstruction technique. CONTRAST:  93m OMNIPAQUE IOHEXOL 300 MG/ML  SOLN COMPARISON:  CT abdomen and pelvis 10/17/2021 FINDINGS: Lower chest: No acute abnormality. Hepatobiliary: No focal liver abnormality is seen. Status post cholecystectomy. No biliary dilatation. Pancreas: Unremarkable. No pancreatic ductal dilatation or surrounding inflammatory changes. Spleen: Normal in size without  focal abnormality. Adrenals/Urinary Tract: There are rounded cortical hypodensities in both kidneys which are too small to characterize, likely cysts. Largest measures 15 mm in the left kidney. There is no hydronephrosis or perinephric fluid. The adrenal glands and bladder are within normal limits. Stomach/Bowel: Stomach is within normal limits. Appendix appears normal. No dilated bowel loops are seen. There is sigmoid colon diverticulosis without evidence for acute diverticulitis. There is rectosigmoid wall thickening versus normal under distension. There is no surrounding inflammation. Vascular/Lymphatic: Aortic atherosclerosis. No enlarged abdominal or pelvic lymph nodes. Reproductive: Prostate gland is enlarged. Other: No abdominal wall hernia or abnormality. No abdominopelvic ascites. Musculoskeletal: No acute or significant osseous findings. IMPRESSION: 1. Mild rectosigmoid colon wall thickening versus normal under distension. No surrounding inflammation. Correlate clinically for proctocolitis. 2. Colonic diverticulosis without evidence for diverticulitis. 3. Renal hypodensities are likely cysts. No follow-up imaging is recommended. JACR 2018 Feb; 264-273, Management of the Incidental RenalMass on CT, [employer] 2021; 814-848, Bosniak Classification of Cystic Renal Masses, Version 2019. 4. Prostatomegaly. 5.  Aortic Atherosclerosis (ICD10-I70.0). Electronically Signed   By: ARonney AstersM.D.   On: 04/24/2022 19:18    Procedures Procedures    Medications Ordered in ED Medications  ciprofloxacin (CIPRO) tablet 500 mg (has no administration in time range)  metroNIDAZOLE (FLAGYL) tablet 500 mg (has no administration in time range)  oxyCODONE (Oxy IR/ROXICODONE) immediate release tablet 10 mg (10 mg Oral Given 04/25/22 1616)    ED Course/ Medical Decision Making/ A&P Clinical Course as of 04/25/22 1703  Sat Apr 25, 2022  1614 Advised by nursing patient feels his bladder is full and difficulty  emptying it - UA without sign of infection - advised nursing to insert foley for decompression, nursing reporting 500 cc's fluid on bladder scan [MT]  1619 Pt spontaneously voided 500 cc's of urine before foley attempt [MT]    Clinical Course User Index [MT] Trifan, MCarola Rhine MD                           Medical Decision Making Amount and/or Complexity of Data Reviewed Labs: ordered.   Patient is a 66year old male presenting for complaints of difficulty urinating, dysuria, and pubic abdominal pain.  Review demonstrates patient was seen last night and had CT abdomen and pelvis demonstrating proctocolitis.  Patient is unaware of this diagnosis at this time.  Patient was able to put over 500 mL of urine out on his own.  UA demonstrates no urinary tract infection.  Patient denies history of sex with other men or anal penetration.  Will cover with antibiotics for proctocolitis and send pain medications over to pharmacy.  Otherwise stable vital signs without a fever.  Doubt sepsis.    Recommended to return immediately for any worsening concerning signs or symptoms including inability to urinate. Patient in no distress and overall condition improved here in the ED. Detailed discussions were had with the patient regarding current findings, and need for close f/u with PCP or on call doctor. The patient has been instructed to return immediately if the symptoms worsen in any way for re-evaluation. Patient verbalized understanding and is in agreement with current care plan. All questions answered prior to discharge.        Final Clinical Impression(s) / ED Diagnoses Final diagnoses:  Proctocolitis  Dysuria    Rx / DC Orders ED Discharge Orders          Ordered    metroNIDAZOLE (FLAGYL) 500 MG tablet  2 times daily        04/25/22 1659    ciprofloxacin (CIPRO) 500 MG tablet  Every 12 hours        04/25/22 1659    oxyCODONE 10 MG TABS  Every 6 hours PRN        04/25/22 1702               Lianne Cure, DO 93/81/01 1703

## 2022-04-25 NOTE — Discharge Instructions (Signed)
Overview Proctitis is inflammation of the lining of the rectum. The rectum is a muscular tube that's connected to the end of your colon. Stool passes through the rectum on its way out of the body.  Proctitis can cause rectal pain, diarrhea, bleeding and discharge, as well as the continuous feeling that you need to have a bowel movement. Proctitis symptoms can be short-lived, or they can become chronic.  Proctitis is common in people who have inflammatory bowel disease (Crohn's disease or ulcerative colitis). Sexually transmitted infections are another frequent cause. Proctitis also can be a side effect of radiation therapy for certain cancers.  Products & Services A Book: [facility] Symptoms Proctitis signs and symptoms may include:  A frequent or continuous feeling that you need to have a bowel movement Rectal bleeding Passing mucus through your rectum Rectal pain Pain on the left side of your abdomen A feeling of fullness in your rectum Diarrhea Pain with bowel movements

## 2022-04-25 NOTE — ED Notes (Signed)
Pt d/c home per MD order. Discharge summary reviewed with pt, pt verbalizes understanding. Ambulatory off unit. No s/s of acute distress noted. Reports family member is discharge ride home.

## 2022-06-29 ENCOUNTER — Encounter (INDEPENDENT_AMBULATORY_CARE_PROVIDER_SITE_OTHER): Payer: Self-pay

## 2022-06-29 ENCOUNTER — Other Ambulatory Visit (INDEPENDENT_AMBULATORY_CARE_PROVIDER_SITE_OTHER): Payer: Self-pay

## 2022-06-29 ENCOUNTER — Encounter (INDEPENDENT_AMBULATORY_CARE_PROVIDER_SITE_OTHER): Payer: Self-pay | Admitting: Gastroenterology

## 2022-06-29 ENCOUNTER — Ambulatory Visit (INDEPENDENT_AMBULATORY_CARE_PROVIDER_SITE_OTHER): Payer: Medicare HMO | Admitting: Gastroenterology

## 2022-06-29 VITALS — BP 147/69 | HR 59 | Temp 98.0°F | Ht 72.0 in | Wt 198.0 lb

## 2022-06-29 DIAGNOSIS — K921 Melena: Secondary | ICD-10-CM | POA: Diagnosis not present

## 2022-06-29 DIAGNOSIS — G894 Chronic pain syndrome: Secondary | ICD-10-CM

## 2022-06-29 DIAGNOSIS — T7501XD Shock due to being struck by lightning, subsequent encounter: Secondary | ICD-10-CM

## 2022-06-29 DIAGNOSIS — T7501XA Shock due to being struck by lightning, initial encounter: Secondary | ICD-10-CM | POA: Insufficient documentation

## 2022-06-29 HISTORY — DX: Melena: K92.1

## 2022-06-29 MED ORDER — CILIDINIUM-CHLORDIAZEPOXIDE 2.5-5 MG PO CAPS: 1.0000 | ORAL_CAPSULE | Freq: Three times a day (TID) | ORAL | 3 refills | Status: DC
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
## 2022-06-29 NOTE — Patient Instructions (Addendum)
Schedule EGD Stop using high dose aspirin including Goody/BC powders, NSAIDs such as Aleve, ibuprofen, naproxen, Motrin, Voltaren or Advil (even the topical ones) Start Librax every 8 hours Stop dicyclomine

## 2022-06-30 ENCOUNTER — Telehealth (INDEPENDENT_AMBULATORY_CARE_PROVIDER_SITE_OTHER): Payer: Self-pay

## 2022-06-30 NOTE — Telephone Encounter (Signed)
Thanks, he should call back when he needs further refills

## 2022-06-30 NOTE — Telephone Encounter (Signed)
Only a 30 day supply of Chlordiazepoxide approved per [facility] as it is only allowing # 30 through 08/31/2023 due to it being a Benzodiazepine.

## 2022-07-08 ENCOUNTER — Ambulatory Visit (INDEPENDENT_AMBULATORY_CARE_PROVIDER_SITE_OTHER): Payer: Medicare HMO | Admitting: Urology

## 2022-07-08 ENCOUNTER — Encounter: Payer: Self-pay | Admitting: Urology

## 2022-07-08 VITALS — BP 131/55 | HR 74 | Ht 72.0 in | Wt 195.0 lb

## 2022-07-08 DIAGNOSIS — N411 Chronic prostatitis: Secondary | ICD-10-CM | POA: Insufficient documentation

## 2022-07-08 DIAGNOSIS — N138 Other obstructive and reflux uropathy: Secondary | ICD-10-CM | POA: Diagnosis not present

## 2022-07-08 DIAGNOSIS — N529 Male erectile dysfunction, unspecified: Secondary | ICD-10-CM

## 2022-07-08 DIAGNOSIS — N401 Enlarged prostate with lower urinary tract symptoms: Secondary | ICD-10-CM | POA: Diagnosis not present

## 2022-07-08 DIAGNOSIS — Z87448 Personal history of other diseases of urinary system: Secondary | ICD-10-CM | POA: Diagnosis not present

## 2022-07-08 HISTORY — DX: Other obstructive and reflux uropathy: N13.8

## 2022-07-08 HISTORY — DX: Male erectile dysfunction, unspecified: N52.9

## 2022-07-08 HISTORY — DX: Chronic prostatitis: N41.1

## 2022-07-08 LAB — BLADDER SCAN AMB NON-IMAGING: Scan Result: 58

## 2022-07-08 MED ORDER — ALFUZOSIN HCL ER 10 MG PO TB24: 10.0000 mg | ORAL_TABLET | Freq: Every day | ORAL | 11 refills | Status: AC

## 2022-07-08 MED ORDER — SILDENAFIL CITRATE 100 MG PO TABS: 100.0000 mg | ORAL_TABLET | Freq: Every day | ORAL | 6 refills | Status: DC | PRN

## 2022-07-08 MED ORDER — LEVOFLOXACIN 500 MG PO TABS: 500.0000 mg | ORAL_TABLET | Freq: Every day | ORAL | 0 refills | Status: AC
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
## 2022-07-08 NOTE — Progress Notes (Signed)
post void residual =75m

## 2022-07-09 LAB — URINALYSIS, ROUTINE W REFLEX MICROSCOPIC
Bilirubin, UA: NEGATIVE
Glucose, UA: NEGATIVE
Ketones, UA: NEGATIVE
Leukocytes,UA: NEGATIVE
Nitrite, UA: NEGATIVE
Protein,UA: NEGATIVE
RBC, UA: NEGATIVE
Specific Gravity, UA: 1.02 (ref 1.005–1.030)
Urobilinogen, Ur: 1 mg/dL (ref 0.2–1.0)
pH, UA: 7 (ref 5.0–7.5)

## 2022-07-28 ENCOUNTER — Telehealth (INDEPENDENT_AMBULATORY_CARE_PROVIDER_SITE_OTHER): Payer: Self-pay | Admitting: *Deleted

## 2022-07-28 NOTE — Telephone Encounter (Signed)
Patient left voicemail that he wants to reschedule endoscopic procedure that is scheduled for this Thursday. Would like a call back on [phone].

## 2022-07-28 NOTE — Telephone Encounter (Signed)
Spoke with pt. He has been rescheduled to 12/22 at 1pm. Aware will send new instructions to him.

## 2022-08-05 ENCOUNTER — Ambulatory Visit: Payer: Medicare HMO | Admitting: Urology

## 2022-08-21 ENCOUNTER — Ambulatory Visit (HOSPITAL_BASED_OUTPATIENT_CLINIC_OR_DEPARTMENT_OTHER): Payer: Medicare HMO | Admitting: Certified Registered"

## 2022-08-21 ENCOUNTER — Other Ambulatory Visit: Payer: Self-pay

## 2022-08-21 ENCOUNTER — Encounter (HOSPITAL_COMMUNITY)
Admission: RE | Disposition: A | Discharge status: Home / Self Care | Payer: Self-pay | Source: Ambulatory Visit | Attending: Gastroenterology

## 2022-08-21 ENCOUNTER — Ambulatory Visit (HOSPITAL_COMMUNITY): Payer: Medicare HMO | Admitting: Certified Registered"

## 2022-08-21 ENCOUNTER — Encounter (HOSPITAL_COMMUNITY): Payer: Self-pay | Admitting: Gastroenterology

## 2022-08-21 ENCOUNTER — Ambulatory Visit (HOSPITAL_COMMUNITY)
Admission: RE | Admit: 2022-08-21 | Discharge: 2022-08-21 | Disposition: A | Discharge status: Home / Self Care | Payer: Medicare HMO | Source: Ambulatory Visit | Attending: Gastroenterology | Admitting: Gastroenterology

## 2022-08-21 DIAGNOSIS — K3184 Gastroparesis: Secondary | ICD-10-CM | POA: Diagnosis not present

## 2022-08-21 DIAGNOSIS — G4733 Obstructive sleep apnea (adult) (pediatric): Secondary | ICD-10-CM | POA: Insufficient documentation

## 2022-08-21 DIAGNOSIS — Z8673 Personal history of transient ischemic attack (TIA), and cerebral infarction without residual deficits: Secondary | ICD-10-CM | POA: Diagnosis not present

## 2022-08-21 DIAGNOSIS — E1143 Type 2 diabetes mellitus with diabetic autonomic (poly)neuropathy: Secondary | ICD-10-CM | POA: Insufficient documentation

## 2022-08-21 DIAGNOSIS — F17210 Nicotine dependence, cigarettes, uncomplicated: Secondary | ICD-10-CM | POA: Diagnosis not present

## 2022-08-21 DIAGNOSIS — K589 Irritable bowel syndrome without diarrhea: Secondary | ICD-10-CM | POA: Insufficient documentation

## 2022-08-21 DIAGNOSIS — R1310 Dysphagia, unspecified: Secondary | ICD-10-CM | POA: Diagnosis not present

## 2022-08-21 DIAGNOSIS — Z7984 Long term (current) use of oral hypoglycemic drugs: Secondary | ICD-10-CM | POA: Diagnosis not present

## 2022-08-21 DIAGNOSIS — K449 Diaphragmatic hernia without obstruction or gangrene: Secondary | ICD-10-CM

## 2022-08-21 DIAGNOSIS — K3189 Other diseases of stomach and duodenum: Secondary | ICD-10-CM

## 2022-08-21 DIAGNOSIS — R109 Unspecified abdominal pain: Secondary | ICD-10-CM | POA: Diagnosis not present

## 2022-08-21 DIAGNOSIS — G8929 Other chronic pain: Secondary | ICD-10-CM | POA: Insufficient documentation

## 2022-08-21 DIAGNOSIS — F419 Anxiety disorder, unspecified: Secondary | ICD-10-CM | POA: Diagnosis not present

## 2022-08-21 DIAGNOSIS — I10 Essential (primary) hypertension: Secondary | ICD-10-CM | POA: Diagnosis not present

## 2022-08-21 DIAGNOSIS — K219 Gastro-esophageal reflux disease without esophagitis: Secondary | ICD-10-CM | POA: Insufficient documentation

## 2022-08-21 DIAGNOSIS — F418 Other specified anxiety disorders: Secondary | ICD-10-CM | POA: Diagnosis not present

## 2022-08-21 DIAGNOSIS — K921 Melena: Secondary | ICD-10-CM

## 2022-08-21 DIAGNOSIS — F32A Depression, unspecified: Secondary | ICD-10-CM | POA: Diagnosis not present

## 2022-08-21 DIAGNOSIS — G40909 Epilepsy, unspecified, not intractable, without status epilepticus: Secondary | ICD-10-CM | POA: Diagnosis not present

## 2022-08-21 HISTORY — PX: ESOPHAGOGASTRODUODENOSCOPY (EGD) WITH PROPOFOL: SHX5813

## 2022-08-21 HISTORY — PX: BIOPSY: SHX5522

## 2022-08-21 HISTORY — PX: SAVORY DILATION: SHX5439

## 2022-08-21 LAB — GLUCOSE, CAPILLARY: Glucose-Capillary: 106 mg/dL — ABNORMAL HIGH (ref 70–99)

## 2022-08-21 SURGERY — ESOPHAGOGASTRODUODENOSCOPY (EGD) WITH PROPOFOL
Anesthesia: General

## 2022-08-21 MED ORDER — PROPOFOL 10 MG/ML IV BOLUS
INTRAVENOUS | Status: DC | PRN
  Administered 2022-08-21: 100 mg via INTRAVENOUS

## 2022-08-21 MED ORDER — LIDOCAINE HCL (CARDIAC) PF 100 MG/5ML IV SOSY
PREFILLED_SYRINGE | INTRAVENOUS | Status: DC | PRN
  Administered 2022-08-21: 50 mg via INTRATRACHEAL

## 2022-08-21 MED ORDER — LACTATED RINGERS IV SOLN: INTRAVENOUS | Status: DC

## 2022-08-21 MED ORDER — PHENYLEPHRINE 80 MCG/ML (10ML) SYRINGE FOR IV PUSH (FOR BLOOD PRESSURE SUPPORT): PREFILLED_SYRINGE | INTRAVENOUS | Status: DC | PRN

## 2022-08-21 NOTE — Transfer of Care (Signed)
Immediate Anesthesia Transfer of Care Note  Patient: Brian [name]  Procedure(s) Performed: ESOPHAGOGASTRODUODENOSCOPY (EGD) WITH PROPOFOL SAVORY DILATION BIOPSY  Patient Location: Endoscopy Unit  Anesthesia Type:General  Level of Consciousness: awake, alert , oriented, and patient cooperative  Airway & Oxygen Therapy: Patient Spontanous Breathing  Post-op Assessment: Report given to RN, Post -op Vital signs reviewed and stable, and Patient moving all extremities  Post vital signs: Reviewed and stable  Last Vitals:  Vitals Value Taken Time  BP 100/58 08/21/22 1252  Temp 36.3 C 08/21/22 1247  Pulse 65 08/21/22 1252  Resp 16 08/21/22 1252  SpO2 98 % 08/21/22 1252    Last Pain:  Vitals:   08/21/22 1247  TempSrc: Oral  PainSc: 0-No pain      Patients Stated Pain Goal: 9 (74/71/59 [id number])  Complications: No notable events documented.

## 2022-08-21 NOTE — Anesthesia Postprocedure Evaluation (Signed)
Anesthesia Post Note  Patient: Brian [name]  Procedure(s) Performed: ESOPHAGOGASTRODUODENOSCOPY (EGD) WITH PROPOFOL SAVORY DILATION BIOPSY  Patient location during evaluation: Phase II Anesthesia Type: General Level of consciousness: awake Pain management: pain level controlled Vital Signs Assessment: post-procedure vital signs reviewed and stable Respiratory status: spontaneous breathing and respiratory function stable Cardiovascular status: blood pressure returned to baseline and stable Postop Assessment: no headache and no apparent nausea or vomiting Anesthetic complications: no Comments: Late entry   No notable events documented.   Last Vitals:  Vitals:   08/21/22 1247 08/21/22 1252  BP: (!) 87/41 (!) 100/58  Pulse: 61 65  Resp: 14 16  Temp: (!) 36.3 C   SpO2: 96% 98%    Last Pain:  Vitals:   08/21/22 1247  TempSrc: Oral  PainSc: 0-No pain                 Louann Sjogren

## 2022-08-21 NOTE — Discharge Instructions (Signed)
You are being discharged to home.  Resume your previous diet.  We are waiting for your pathology results.  Do not take any ibuprofen (including Advil, Motrin or Nuprin), naproxen, or other non-steroidal anti-inflammatory drugs.

## 2022-08-21 NOTE — Anesthesia Preprocedure Evaluation (Signed)
Anesthesia Evaluation  Patient identified by MRN, date of birth, ID band Patient awake    Reviewed: Allergy & Precautions, NPO status , Patient's Chart, lab work & pertinent test results  History of Anesthesia Complications (+) AWARENESS UNDER ANESTHESIA and history of anesthetic complications  Airway Mallampati: II  TM Distance: >3 FB Neck ROM: Full    Dental  (+) Dental Advisory Given, Missing, Chipped, Poor Dentition   Pulmonary shortness of breath and with exertion, sleep apnea , Current SmokerPatient did not abstain from smoking.   Pulmonary exam normal breath sounds clear to auscultation       Cardiovascular Exercise Tolerance: Poor hypertension, Pt. on medications  Rhythm:Regular Rate:Normal + Systolic murmurs    Neuro/Psych  Headaches, Seizures -, Well Controlled,  PSYCHIATRIC DISORDERS Anxiety Depression     Neuromuscular disease (loss of balance) CVA (MRI findings, patient doesn't know when it happened)    GI/Hepatic Neg liver ROS,GERD (gastroparesis )  Medicated and Poorly Controlled,,  Endo/Other  diabetes, Well Controlled, Type 2, Oral Hypoglycemic Agents    Renal/GU Renal InsufficiencyRenal disease     Musculoskeletal  (+) Arthritis , Osteoarthritis,    Abdominal   Peds  Hematology   Anesthesia Other Findings C/O back and left sided pain  Reproductive/Obstetrics                             Anesthesia Physical Anesthesia Plan  ASA: 3  Anesthesia Plan: General   Post-op Pain Management: Minimal or no pain anticipated   Induction: Intravenous  PONV Risk Score and Plan: TIVA and Propofol infusion  Airway Management Planned: Nasal Cannula and Natural Airway  Additional Equipment:   Intra-op Plan:   Post-operative Plan:   Informed Consent: I have reviewed the patients History and Physical, chart, labs and discussed the procedure including the risks, benefits and  alternatives for the proposed anesthesia with the patient or authorized representative who has indicated his/her understanding and acceptance.     Dental advisory given  Plan Discussed with: CRNA and Surgeon  Anesthesia Plan Comments:         Anesthesia Quick Evaluation

## 2022-08-21 NOTE — Op Note (Signed)
[facility] Patient Name: Brian [name] Procedure Date: 08/21/2022 12:35 PM MRN: [id number] Date of Birth: [birth year] Attending MD: [name] , , [id number] CSN: [id number] Age: 66 Admit Type: Outpatient Procedure:                Upper GI endoscopy Indications:              Abdominal pain, Dysphagia, Melena Providers:                [name], Crystal Page, Raphael Gibney,                            Technician Referring MD:              Medicines:                Monitored Anesthesia Care Complications:            No immediate complications. Estimated Blood Loss:     Estimated blood loss: none. Procedure:                Pre-Anesthesia Assessment:                           - Prior to the procedure, a History and Physical                            was performed, and patient medications, allergies                            and sensitivities were reviewed. The patient's                            tolerance of previous anesthesia was reviewed.                           - The risks and benefits of the procedure and the                            sedation options and risks were discussed with the                            patient. All questions were answered and informed                            consent was obtained.                           - ASA Grade Assessment: II - A patient with mild                            systemic disease.                           After obtaining informed consent, the endoscope was                            passed under direct vision. Throughout the  procedure, the patient's blood pressure, pulse, and                            oxygen saturations were monitored continuously. The                            [id number]-[id number] ([id number]) scope was introduced through the                            mouth, and advanced to the second part of duodenum.                            The upper GI endoscopy was accomplished without                             difficulty. The patient tolerated the procedure                            well. Scope In: 12:37:31 PM Scope Out: 12:44:35 PM Total Procedure Duration: 0 hours 7 minutes 4 seconds  Findings:      No endoscopic abnormality was evident in the esophagus to explain the       patient's complaint of dysphagia. It was decided, however, to proceed       with dilation of the entire esophagus. A guidewire was placed and the       scope was withdrawn. Dilation was performed with a Savary dilator with       no resistance at 18 mm. The dilation site was examined following       endoscope reinsertion and showed no change.      A 1 cm hiatal hernia was present.      A few diminutive erosions with no stigmata of recent bleeding were found       in the gastric body. Biopsies were taken with a cold forceps for       Helicobacter pylori testing.      The examined duodenum was normal.      Note: erosions likely related to NSAID use, now healed Impression:               - No endoscopic esophageal abnormality to explain                            patient's dysphagia. Esophagus dilated. Dilated.                           - 1 cm hiatal hernia.                           - Erosive gastropathy with no stigmata of recent                            bleeding. Biopsied.                           - Normal examined duodenum. Moderate Sedation:      Per Anesthesia Care Recommendation:           -  Discharge patient to home (ambulatory).                           - Resume previous diet.                           - Await pathology results.                           - No ibuprofen, naproxen, or other non-steroidal                            anti-inflammatory drugs. Procedure Code(s):        --- Professional ---                           [phone], Esophagogastroduodenoscopy, flexible,                            transoral; with insertion of guide wire followed by                            passage of  dilator(s) through esophagus over guide                            wire                           43239, 70, Esophagogastroduodenoscopy, flexible,                            transoral; with biopsy, single or multiple Diagnosis Code(s):        --- Professional ---                           R13.10, Dysphagia, unspecified                           K44.9, Diaphragmatic hernia without obstruction or                            gangrene                           K31.89, Other diseases of stomach and duodenum                           R10.9, Unspecified abdominal pain                           K92.1, Melena (includes Hematochezia) CPT copyright 2022 [employer]. All rights reserved. The codes documented in this report are preliminary and upon coder review may  be revised to meet current compliance requirements. [name], MD [name],  08/21/2022 12:49:29 PM This report has been signed electronically. Number of Addenda: 0
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
## 2022-08-25 LAB — SURGICAL PATHOLOGY

## 2022-09-02 ENCOUNTER — Encounter (HOSPITAL_COMMUNITY): Payer: Self-pay | Admitting: Gastroenterology

## 2022-09-17 ENCOUNTER — Telehealth: Payer: Self-pay

## 2022-09-17 NOTE — Telephone Encounter (Signed)
Patient called and advised they needed a refill on medication below.   Medication: alfuzosin (UROXATRAL) 10 MG 24 hr tablet  Pharmacy: [place], [place]   Thank you

## 2022-09-18 NOTE — Telephone Encounter (Signed)
[facility] previously rx'd, I called patient to see if he wanted to follow up with Dr. Alyson Ingles to be seen by Dr. Felipa Eth in [place] to continue receiving rx.  No answer, I left voicemail requesting a call back.

## 2022-10-29 ENCOUNTER — Encounter: Payer: Self-pay | Admitting: Radiology

## 2022-11-19 ENCOUNTER — Other Ambulatory Visit (INDEPENDENT_AMBULATORY_CARE_PROVIDER_SITE_OTHER): Payer: Self-pay | Admitting: Gastroenterology

## 2022-11-21 ENCOUNTER — Other Ambulatory Visit (INDEPENDENT_AMBULATORY_CARE_PROVIDER_SITE_OTHER): Payer: Self-pay | Admitting: Gastroenterology

## 2022-11-21 MED ORDER — FAMOTIDINE 40 MG PO TABS: 40.0000 mg | ORAL_TABLET | Freq: Two times a day (BID) | ORAL | 3 refills | Status: AC

## 2022-12-28 ENCOUNTER — Ambulatory Visit (INDEPENDENT_AMBULATORY_CARE_PROVIDER_SITE_OTHER): Payer: Medicare HMO | Admitting: Gastroenterology

## 2022-12-31 ENCOUNTER — Other Ambulatory Visit: Payer: Self-pay

## 2022-12-31 ENCOUNTER — Encounter (HOSPITAL_COMMUNITY): Payer: Self-pay | Admitting: Emergency Medicine

## 2022-12-31 ENCOUNTER — Emergency Department (HOSPITAL_COMMUNITY): Payer: Medicare HMO

## 2022-12-31 ENCOUNTER — Emergency Department (HOSPITAL_COMMUNITY)
Admission: EM | Admit: 2022-12-31 | Discharge: 2022-12-31 | Disposition: A | Discharge status: Home / Self Care | Payer: Medicare HMO | Attending: Emergency Medicine | Admitting: Emergency Medicine

## 2022-12-31 DIAGNOSIS — G40909 Epilepsy, unspecified, not intractable, without status epilepticus: Secondary | ICD-10-CM | POA: Diagnosis not present

## 2022-12-31 DIAGNOSIS — Z7982 Long term (current) use of aspirin: Secondary | ICD-10-CM | POA: Diagnosis not present

## 2022-12-31 DIAGNOSIS — R569 Unspecified convulsions: Secondary | ICD-10-CM | POA: Diagnosis present

## 2022-12-31 LAB — COMPREHENSIVE METABOLIC PANEL
ALT: 18 U/L (ref 0–44)
AST: 14 U/L — ABNORMAL LOW (ref 15–41)
Albumin: 3.5 g/dL (ref 3.5–5.0)
Alkaline Phosphatase: 50 U/L (ref 38–126)
Anion gap: 7 (ref 5–15)
BUN: 24 mg/dL — ABNORMAL HIGH (ref 8–23)
CO2: 20 mmol/L — ABNORMAL LOW (ref 22–32)
Calcium: 8.2 mg/dL — ABNORMAL LOW (ref 8.9–10.3)
Chloride: 114 mmol/L — ABNORMAL HIGH (ref 98–111)
Creatinine, Ser: 1.37 mg/dL — ABNORMAL HIGH (ref 0.61–1.24)
GFR, Estimated: 57 mL/min — ABNORMAL LOW (ref >60)
Glucose, Bld: 89 mg/dL (ref 70–99)
Potassium: 3.6 mmol/L (ref 3.5–5.1)
Sodium: 141 mmol/L (ref 135–145)
Total Bilirubin: 0.5 mg/dL (ref 0.3–1.2)
Total Protein: 6.1 g/dL — ABNORMAL LOW (ref 6.5–8.1)

## 2022-12-31 LAB — CBC WITH DIFFERENTIAL/PLATELET
Abs Immature Granulocytes: 0.04 10*3/uL (ref 0.00–0.07)
Basophils Absolute: 0.1 10*3/uL (ref 0.0–0.1)
Basophils Relative: 1 %
Eosinophils Absolute: 0.4 10*3/uL (ref 0.0–0.5)
Eosinophils Relative: 4 %
HCT: 37.4 % — ABNORMAL LOW (ref 39.0–52.0)
Hemoglobin: 12.5 g/dL — ABNORMAL LOW (ref 13.0–17.0)
Immature Granulocytes: 0 %
Lymphocytes Relative: 16 %
Lymphs Abs: 1.5 10*3/uL (ref 0.7–4.0)
MCH: 32.6 pg (ref 26.0–34.0)
MCHC: 33.4 g/dL (ref 30.0–36.0)
MCV: 97.7 fL (ref 80.0–100.0)
Monocytes Absolute: 0.9 10*3/uL (ref 0.1–1.0)
Monocytes Relative: 10 %
Neutro Abs: 6.5 10*3/uL (ref 1.7–7.7)
Neutrophils Relative %: 69 %
Platelets: 176 10*3/uL (ref 150–400)
RBC: 3.83 MIL/uL — ABNORMAL LOW (ref 4.22–5.81)
RDW: 13 % (ref 11.5–15.5)
WBC: 9.5 10*3/uL (ref 4.0–10.5)
nRBC: 0 % (ref 0.0–0.2)

## 2022-12-31 LAB — TROPONIN I (HIGH SENSITIVITY)
Troponin I (High Sensitivity): 10 ng/L (ref <18)
Troponin I (High Sensitivity): 13 ng/L (ref <18)

## 2022-12-31 LAB — CBG MONITORING, ED: Glucose-Capillary: 96 mg/dL (ref 70–99)

## 2022-12-31 MED ORDER — SODIUM CHLORIDE 0.9 % IV BOLUS
500.0000 mL | Freq: Once | INTRAVENOUS | Status: AC
  Administered 2022-12-31: 500 mL via INTRAVENOUS

## 2022-12-31 MED ORDER — HYDROMORPHONE HCL 1 MG/ML IJ SOLN
0.5000 mg | Freq: Once | INTRAMUSCULAR | Status: AC
  Administered 2022-12-31: 0.5 mg via INTRAVENOUS
  Filled 2022-12-31: qty 0.5

## 2022-12-31 MED ORDER — OXYCODONE HCL 5 MG PO TABS: 5.0000 mg | ORAL_TABLET | Freq: Four times a day (QID) | ORAL | 0 refills | Status: DC | PRN

## 2022-12-31 MED ORDER — MORPHINE SULFATE (PF) 4 MG/ML IV SOLN
4.0000 mg | Freq: Once | INTRAVENOUS | Status: AC
  Administered 2022-12-31: 4 mg via INTRAVENOUS
  Filled 2022-12-31: qty 1

## 2022-12-31 NOTE — ED Provider Notes (Signed)
[place] EMERGENCY DEPARTMENT AT [facility] Provider Note   CSN: [id number] Arrival date & time: 12/31/22  1944     History  Chief Complaint  Patient presents with   Seizures    Brian [name] is a 67 y.o. male.  Patient had a history of seizures many years ago.  He has not been on a seizure medicine.  Patient had a seizure today.  Patient also has hypertension  The history is provided by the patient and medical records. No language interpreter was used.  Seizures Seizure activity on arrival: yes   Seizure type:  Grand mal Preceding symptoms: no sensation of an aura present   Initial focality:  None Episode characteristics: no abnormal movements   Postictal symptoms: confusion   Return to baseline: yes   Severity:  Mild Timing:  Once Progression:  Resolved Context: not alcohol withdrawal        Home Medications Prior to Admission medications   Medication Sig Start Date End Date Taking? Authorizing Provider  amLODipine (NORVASC) 5 MG tablet Take 5 mg by mouth daily.  03/17/16  Yes [provider]  aspirin EC 81 MG tablet Take 1 tablet (81 mg total) by mouth daily. 06/12/21  Yes Rehman, Joline Maxcy, MD  azelastine (ASTELIN) 0.1 % nasal spray Place 1 spray into both nostrils daily. 09/22/18  Yes [provider]  diphenhydrAMINE (BENADRYL) 25 MG tablet Take 25 mg by mouth in the morning and at bedtime.    Yes [provider]  DULoxetine (CYMBALTA) 60 MG capsule Take 60 mg by mouth daily.   Yes [provider]  famotidine (PEPCID) 40 MG tablet Take 1 tablet (40 mg total) by mouth 2 (two) times daily. 11/21/22  Yes Carlan, Chelsea L, NP  fluticasone (FLONASE) 50 MCG/ACT nasal spray Place 1 spray into both nostrils daily.   Yes [provider]  gabapentin (NEURONTIN) 800 MG tablet Take 800 mg by mouth 4 (four) times daily.    Yes [provider]  ketoconazole (NIZORAL) 2 % cream Apply 1 application topically daily as  needed for irritation.  03/30/16  Yes [provider]  losartan (COZAAR) 100 MG tablet Take 100 mg by mouth daily.  06/27/20  Yes [provider]  magnesium oxide (MAG-OX) 400 (240 Mg) MG tablet Take 400 mg by mouth daily.   Yes [provider]  MILK THISTLE PO Take 1,000 mg by mouth daily.   Yes [provider]  Multiple Minerals (CALCIUM-MAGNESIUM-ZINC) TABS Take 1 tablet by mouth daily.   Yes [provider]  ondansetron (ZOFRAN-ODT) 4 MG disintegrating tablet Take 4 mg by mouth every 8 (eight) hours as needed for nausea/vomiting. 10/02/21  Yes [provider]  oxyCODONE (ROXICODONE) 5 MG immediate release tablet Take 1 tablet (5 mg total) by mouth every 6 (six) hours as needed for severe pain. 12/31/22  Yes Bethann Berkshire, MD  pantoprazole (PROTONIX) 40 MG tablet Take 40 mg by mouth daily.   Yes [provider]  Polyethyl Glycol-Propyl Glycol (SYSTANE OP) Place 1 drop into both eyes daily as needed (dry eyes).   Yes [provider]  sildenafil (VIAGRA) 100 MG tablet Take 1 tablet (100 mg total) by mouth daily as needed for erectile dysfunction. 07/08/22  Yes Stoneking, Danford Bad., MD  simvastatin (ZOCOR) 80 MG tablet Take 80 mg by mouth every evening. 12/28/13  Yes [provider]  topiramate (TOPAMAX) 100 MG tablet Take 200 mg by mouth 2 (two) times  daily.   Yes [provider]  vitamin C (ASCORBIC ACID) 500 MG tablet Take 500 mg by mouth daily.   Yes [provider]  alfuzosin (UROXATRAL) 10 MG 24 hr tablet Take 1 tablet (10 mg total) by mouth daily. Patient not taking: Reported on 12/31/2022 07/08/22   Milderd Meager., MD  clidinium-chlordiazePOXIDE (LIBRAX) 5-2.5 MG capsule Take 1 capsule by mouth 3 (three) times daily before meals. Patient not taking: Reported on 12/31/2022 06/29/22   Dolores Frame, MD  hyoscyamine (LEVSIN SL) 0.125 MG SL tablet Place 1 tablet (0.125 mg total) under the  tongue every 6 (six) hours as needed (abdominal pain). Patient not taking: Reported on 12/31/2022 12/18/21   Dolores Frame, MD  Omega-3 Fatty Acids (FISH OIL) 1000 MG CAPS Take 1,000 mg by mouth daily.     [provider]  ondansetron (ZOFRAN) 8 MG tablet TAKE 1 TABLET TWICE DAILY AS NEEDED FOR NAUSEA AND VOMITING Patient taking differently: Take 8 mg by mouth 2 (two) times daily as needed for nausea or vomiting. 04/05/20   Tiffany Kocher, PA-C      Allergies    Ketorolac tromethamine, Nsaids, Tylenol [acetaminophen], Doxycycline, Sulfamethoxazole, and Tetracyclines & related    Review of Systems   Review of Systems  Constitutional:  Negative for appetite change and fatigue.  HENT:  Negative for congestion, ear discharge and sinus pressure.   Eyes:  Negative for discharge.  Respiratory:  Negative for cough.   Cardiovascular:  Negative for chest pain.  Gastrointestinal:  Negative for abdominal pain and diarrhea.  Genitourinary:  Negative for frequency and hematuria.  Musculoskeletal:  Negative for back pain.  Skin:  Negative for rash.  Neurological:  Positive for seizures. Negative for headaches.  Psychiatric/Behavioral:  Negative for hallucinations.     Physical Exam Updated Vital Signs BP 135/68   Pulse 62   Temp 98.2 F (36.8 C) (Oral)   Resp 17   Ht 6' (1.829 m)   Wt 79.4 kg   SpO2 97%   BMI 23.73 kg/m  Physical Exam Vitals and nursing note reviewed.  Constitutional:      Appearance: He is well-developed.  HENT:     Head: Normocephalic.     Nose: Nose normal.  Eyes:     General: No scleral icterus.    Conjunctiva/sclera: Conjunctivae normal.  Neck:     Thyroid: No thyromegaly.  Cardiovascular:     Rate and Rhythm: Normal rate and regular rhythm.     Heart sounds: No murmur heard.    No friction rub. No gallop.  Pulmonary:     Breath sounds: No stridor. No wheezing or rales.  Chest:     Chest wall: No tenderness.  Abdominal:     General:  There is no distension.     Tenderness: There is no abdominal tenderness. There is no rebound.  Musculoskeletal:        General: Normal range of motion.     Cervical back: Neck supple.  Lymphadenopathy:     Cervical: No cervical adenopathy.  Skin:    Findings: No erythema or rash.  Neurological:     Mental Status: He is alert and oriented to person, place, and time.     Motor: No abnormal muscle tone.     Coordination: Coordination normal.  Psychiatric:        Behavior: Behavior normal.     ED Results / Procedures / Treatments   Labs (all labs ordered are listed,  but only abnormal results are displayed) Labs Reviewed  CBC WITH DIFFERENTIAL/PLATELET - Abnormal; Notable for the following components:      Result Value   RBC 3.83 (*)    Hemoglobin 12.5 (*)    HCT 37.4 (*)    All other components within normal limits  COMPREHENSIVE METABOLIC PANEL - Abnormal; Notable for the following components:   Chloride 114 (*)    CO2 20 (*)    BUN 24 (*)    Creatinine, Ser 1.37 (*)    Calcium 8.2 (*)    Total Protein 6.1 (*)    AST 14 (*)    GFR, Estimated 57 (*)    All other components within normal limits  CBG MONITORING, ED  TROPONIN I (HIGH SENSITIVITY)  TROPONIN I (HIGH SENSITIVITY)    EKG None  Radiology CT Head Wo Contrast  Result Date: 12/31/2022 CLINICAL DATA:  Head trauma seizure EXAM: CT HEAD WITHOUT CONTRAST CT CERVICAL SPINE WITHOUT CONTRAST TECHNIQUE: Multidetector CT imaging of the head and cervical spine was performed following the standard protocol without intravenous contrast. Multiplanar CT image reconstructions of the cervical spine were also generated. RADIATION DOSE REDUCTION: This exam was performed according to the departmental dose-optimization program which includes automated exposure control, adjustment of the mA and/or kV according to patient size and/or use of iterative reconstruction technique. COMPARISON:  MRI 03/19/2021, CT brain 03/19/2021 FINDINGS: CT  HEAD FINDINGS Brain: No acute territorial infarction, hemorrhage or intracranial mass. The ventricles are nonenlarged. Vascular: No hyperdense vessels.  Carotid vascular calcification Skull: Normal. Negative for fracture or focal lesion. Sinuses/Orbits: No acute finding. Other: None CT CERVICAL SPINE FINDINGS Alignment: Mild reversal of cervical lordosis. No subluxation. Facet alignment is within normal limits. Skull base and vertebrae: No acute fracture. No primary bone lesion or focal pathologic process. Soft tissues and spinal canal: No prevertebral fluid or swelling. No visible canal hematoma. Disc levels: Multilevel degenerative changes. Moderate severe disc space narrowing C5-C6 and C6-C7. Facet degenerative changes at multiple levels. Multilevel foraminal stenosis. Upper chest: Negative. Other: None IMPRESSION: 1. Negative non contrasted CT appearance of the brain. 2. Reversal of cervical lordosis with degenerative changes. No acute osseous abnormality. Electronically Signed   By: Jasmine Pang M.D.   On: 12/31/2022 21:35   CT Cervical Spine Wo Contrast  Result Date: 12/31/2022 CLINICAL DATA:  Head trauma seizure EXAM: CT HEAD WITHOUT CONTRAST CT CERVICAL SPINE WITHOUT CONTRAST TECHNIQUE: Multidetector CT imaging of the head and cervical spine was performed following the standard protocol without intravenous contrast. Multiplanar CT image reconstructions of the cervical spine were also generated. RADIATION DOSE REDUCTION: This exam was performed according to the departmental dose-optimization program which includes automated exposure control, adjustment of the mA and/or kV according to patient size and/or use of iterative reconstruction technique. COMPARISON:  MRI 03/19/2021, CT brain 03/19/2021 FINDINGS: CT HEAD FINDINGS Brain: No acute territorial infarction, hemorrhage or intracranial mass. The ventricles are nonenlarged. Vascular: No hyperdense vessels.  Carotid vascular calcification Skull: Normal.  Negative for fracture or focal lesion. Sinuses/Orbits: No acute finding. Other: None CT CERVICAL SPINE FINDINGS Alignment: Mild reversal of cervical lordosis. No subluxation. Facet alignment is within normal limits. Skull base and vertebrae: No acute fracture. No primary bone lesion or focal pathologic process. Soft tissues and spinal canal: No prevertebral fluid or swelling. No visible canal hematoma. Disc levels: Multilevel degenerative changes. Moderate severe disc space narrowing C5-C6 and C6-C7. Facet degenerative changes at multiple levels. Multilevel foraminal stenosis. Upper chest: Negative. Other: None  IMPRESSION: 1. Negative non contrasted CT appearance of the brain. 2. Reversal of cervical lordosis with degenerative changes. No acute osseous abnormality. Electronically Signed   By: Jasmine Pang M.D.   On: 12/31/2022 21:35   DG Chest Port 1 View  Result Date: 12/31/2022 CLINICAL DATA:  Seizure. EXAM: PORTABLE CHEST 1 VIEW COMPARISON:  Chest radiograph dated 02/28/2022. FINDINGS: No focal consolidation, pleural effusion, or pneumothorax. The cardiac silhouette is within normal limits. Atherosclerotic calcification of the aorta. No acute osseous pathology. IMPRESSION: No active disease. Electronically Signed   By: Elgie Collard M.D.   On: 12/31/2022 21:32    Procedures Procedures    Medications Ordered in ED Medications  sodium chloride 0.9 % bolus 500 mL (0 mLs Intravenous Stopped 12/31/22 2147)  HYDROmorphone (DILAUDID) injection 0.5 mg (0.5 mg Intravenous Given 12/31/22 2103)  morphine (PF) 4 MG/ML injection 4 mg (4 mg Intravenous Given 12/31/22 2158)    ED Course/ Medical Decision Making/ A&P                             Medical Decision Making Amount and/or Complexity of Data Reviewed Labs: ordered. Radiology: ordered. ECG/medicine tests: ordered.  Risk Prescription drug management.  This patient presents to the ED for concern of seizure, this involves an extensive number of  treatment options, and is a complaint that carries with it a high risk of complications and morbidity.  The differential diagnosis includes stroke, metastatic disease, seizure disorder   Co morbidities that complicate the patient evaluation  History of seizures   Additional history obtained:  Additional history obtained from family External records from outside source obtained and reviewed including hospital records   Lab Tests:  I Ordered, and personally interpreted labs.  The pertinent results include: Hemoglobin 12.7 BUN 24 creatinine 1.37   Imaging Studies ordered:  I ordered imaging studies including chest x-ray and CT head I independently visualized and interpreted imaging which showed negative I agree with the radiologist interpretation   Cardiac Monitoring: / EKG:  The patient was maintained on a cardiac monitor.  I personally viewed and interpreted the cardiac monitored which showed an underlying rhythm of: Normal sinus rhythm   Consultations Obtained:  No consultant  Problem List / ED Course / Critical interventions / Medication management  Seizure Normal saline for dehydration and pain medicines for headache Reevaluation of the patient after these medicines showed that the patient improved I have reviewed the patients home medicines and have made adjustments as needed   Social Determinants of Health:  None   Test / Admission - Considered:  None  Patient with a seizure.  Labs and CT scan unremarkable.  Patient will be discharged home to follow-up with [id number] [facility] neurology        Final Clinical Impression(s) / ED Diagnoses Final diagnoses:  Seizure [facility])    Rx / DC Orders ED Discharge Orders          Ordered    oxyCODONE (ROXICODONE) 5 MG immediate release tablet  Every 6 hours PRN        12/31/22 2329              Bethann Berkshire, MD 01/02/23 1340

## 2022-12-31 NOTE — Discharge Instructions (Signed)
Do not do any driving.  Call make an appointment for the next 2 to 3 weeks with the [facility] for your seizure.  Return if you have another seizure

## 2022-12-31 NOTE — ED Triage Notes (Signed)
Mom states pt fell with seizure and struck head on cabinet. PT states he has not had seizure since the 80s. Pt c/o generalized weakness x 2 months with multiple falls.

## 2023-01-27 ENCOUNTER — Encounter: Payer: Self-pay | Admitting: Neurology

## 2023-02-08 ENCOUNTER — Ambulatory Visit (INDEPENDENT_AMBULATORY_CARE_PROVIDER_SITE_OTHER): Payer: Medicare HMO | Admitting: Gastroenterology

## 2023-02-08 ENCOUNTER — Encounter (INDEPENDENT_AMBULATORY_CARE_PROVIDER_SITE_OTHER): Payer: Self-pay | Admitting: Gastroenterology

## 2023-02-08 VITALS — BP 126/61 | HR 62 | Temp 97.8°F | Ht 72.0 in | Wt 201.2 lb

## 2023-02-08 DIAGNOSIS — G894 Chronic pain syndrome: Secondary | ICD-10-CM | POA: Diagnosis not present

## 2023-02-08 DIAGNOSIS — R109 Unspecified abdominal pain: Secondary | ICD-10-CM

## 2023-02-08 DIAGNOSIS — R110 Nausea: Secondary | ICD-10-CM

## 2023-02-08 MED ORDER — CILIDINIUM-CHLORDIAZEPOXIDE 2.5-5 MG PO CAPS: 1.0000 | ORAL_CAPSULE | Freq: Three times a day (TID) | ORAL | 1 refills | Status: DC

## 2023-02-08 NOTE — Patient Instructions (Signed)
Continue with your current nausea medications and to try and get back in with pain management For now, we will try librax, you can take this three times per day before your meals Please avoid NSAIDs (advil, aleve, naproxen, goody powder, ibuprofen) as these can be very hard on your GI tract, causing inflammation, ulcers and damage to the lining of your GI tract.   Follow up 6 months
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
## 2023-02-16 ENCOUNTER — Other Ambulatory Visit (INDEPENDENT_AMBULATORY_CARE_PROVIDER_SITE_OTHER): Payer: Self-pay | Admitting: *Deleted

## 2023-02-16 ENCOUNTER — Telehealth (INDEPENDENT_AMBULATORY_CARE_PROVIDER_SITE_OTHER): Payer: Self-pay | Admitting: *Deleted

## 2023-02-16 NOTE — Telephone Encounter (Signed)
Called pcp and received labs showing elevated wbc pt states he discussed with you at his recent office visit. Labs scanned in epic under media and should have went to you under cc chart ( if I sent correctly - let me know if you do not have them). Labs where drawn on 01/26/23.

## 2023-02-16 NOTE — Telephone Encounter (Signed)
Pt left vm that he was seen a couple weeks ago and was concerned about elevated WBC and we was suppose to get his labs for chelsea to review and no body has contacted him.   I do not see where labs were requested/received. I called his pcp and spoke with Malachi Bonds in medical records and she said she would send over most recent results.   Pt's number [phone]

## 2023-02-17 NOTE — Telephone Encounter (Signed)
Discussed with patient per Leeroy Bock - I do not recall discussing labs, my note does not delineate that I was obtaining them for any reason, he would need to discuss WBC with his PCP  Patient verbalized understanding.

## 2023-02-19 ENCOUNTER — Encounter: Payer: Self-pay | Admitting: Neurology

## 2023-02-19 ENCOUNTER — Ambulatory Visit: Payer: Medicare HMO | Admitting: Neurology

## 2023-02-19 VITALS — BP 146/69 | HR 68 | Ht 72.0 in | Wt 200.8 lb

## 2023-02-19 DIAGNOSIS — R569 Unspecified convulsions: Secondary | ICD-10-CM

## 2023-02-19 DIAGNOSIS — M549 Dorsalgia, unspecified: Secondary | ICD-10-CM | POA: Diagnosis not present
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
## 2023-02-23 ENCOUNTER — Ambulatory Visit (INDEPENDENT_AMBULATORY_CARE_PROVIDER_SITE_OTHER): Payer: Medicare HMO | Admitting: Neurology

## 2023-02-23 DIAGNOSIS — R569 Unspecified convulsions: Secondary | ICD-10-CM

## 2023-02-23 NOTE — Progress Notes (Signed)
EEG complete - results pending 

## 2023-03-01 ENCOUNTER — Telehealth: Payer: Self-pay | Admitting: Neurology

## 2023-03-01 NOTE — Telephone Encounter (Signed)
Pt is calling to get EEG results and was instructed that once the provider receive it someone from the office will give him a call with those results.  Pt verbalized understanding.

## 2023-03-08 NOTE — Procedures (Signed)
ELECTROENCEPHALOGRAM REPORT  Date of Study: 02/23/2023  Patient's Name: Brian [name] MRN: [id number] Date of Birth: November 14, 1955  Referring Provider: Dr. Patrcia Dolly  Clinical History: This is a 67 year old man with recent convulsion. EEG for classification.  Medications: Topiramate, gabapentin, Temazepam, Cymbalta  Technical Summary: A multichannel digital 1-hour EEG recording measured by the international 10-20 system with electrodes applied with paste and impedances below 5000 ohms performed in our laboratory with EKG monitoring in an awake and asleep patient.  Hyperventilation was not performed. Photic stimulation was performed.  The digital EEG was referentially recorded, reformatted, and digitally filtered in a variety of bipolar and referential montages for optimal display.    Description: The patient is awake and asleep during the recording.  During maximal wakefulness, there is a symmetric, medium voltage 10 Hz posterior dominant rhythm that attenuates with eye opening.  The record is symmetric.  During drowsiness and sleep, there is an increase in theta slowing of the background, at times sharply contoured over the left temporal region without clear epileptogenic potential.  Vertex waves and symmetric sleep spindles were seen. Photic stimulation did not elicit any abnormalities.  There were no epileptiform discharges or electrographic seizures seen.    EKG lead was unremarkable.  Impression: This 1-hour awake and asleep EEG is within normal limits.  Clinical Correlation: A normal EEG does not exclude a clinical diagnosis of epilepsy.  If further clinical questions remain, prolonged EEG may be helpful.  Clinical correlation is advised.   Patrcia Dolly, M.D.

## 2023-03-10 ENCOUNTER — Telehealth: Payer: Self-pay | Admitting: Neurology

## 2023-03-10 NOTE — Telephone Encounter (Signed)
Pls let him know EEG is normal. Proceed with brain MRI as scheduled, thanks

## 2023-03-10 NOTE — Telephone Encounter (Signed)
Pt called informed that EEG is normal. Proceed with brain MRI as scheduled

## 2023-03-10 NOTE — Telephone Encounter (Signed)
Patient is calling to receive EEG results from 01/23/23/ [facility]

## 2023-03-11 ENCOUNTER — Ambulatory Visit: Payer: Medicare HMO | Admitting: Neurology

## 2023-03-26 ENCOUNTER — Ambulatory Visit (HOSPITAL_COMMUNITY)
Admission: RE | Admit: 2023-03-26 | Discharge: 2023-03-26 | Disposition: A | Discharge status: Home / Self Care | Payer: Medicare HMO | Source: Ambulatory Visit | Attending: Neurology | Admitting: Neurology

## 2023-03-26 DIAGNOSIS — R569 Unspecified convulsions: Secondary | ICD-10-CM | POA: Insufficient documentation

## 2023-03-26 MED ORDER — GADOBUTROL 1 MMOL/ML IV SOLN
9.0000 mL | Freq: Once | INTRAVENOUS | Status: AC | PRN
  Administered 2023-03-26: 9 mL via INTRAVENOUS

## 2023-03-29 ENCOUNTER — Telehealth: Payer: Self-pay | Admitting: Neurology

## 2023-03-29 ENCOUNTER — Telehealth: Payer: Self-pay

## 2023-03-29 ENCOUNTER — Other Ambulatory Visit: Payer: Self-pay

## 2023-03-29 DIAGNOSIS — M549 Dorsalgia, unspecified: Secondary | ICD-10-CM

## 2023-03-29 DIAGNOSIS — R569 Unspecified convulsions: Secondary | ICD-10-CM

## 2023-03-29 DIAGNOSIS — I729 Aneurysm of unspecified site: Secondary | ICD-10-CM

## 2023-03-29 NOTE — Telephone Encounter (Signed)
Patient called for MRI results. [phone]

## 2023-03-29 NOTE — Telephone Encounter (Signed)
Called and informed pt of results of MRI per Dr. Loleta Chance. He understand and would like to go ahead with Dr. Jorge Ny recommendations.

## 2023-03-29 NOTE — Telephone Encounter (Signed)
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 MR or CT angiography of the head is recommended for further evaluation. Neuro-interventional consultation is also recommended. Pt has already been called an been made aware

## 2023-03-29 NOTE — Telephone Encounter (Signed)
Pt called with MRI results no answer left a voice mail to call back

## 2023-04-01 ENCOUNTER — Ambulatory Visit (INDEPENDENT_AMBULATORY_CARE_PROVIDER_SITE_OTHER): Payer: Medicare HMO | Admitting: Internal Medicine

## 2023-04-01 ENCOUNTER — Encounter: Payer: Self-pay | Admitting: Internal Medicine

## 2023-04-01 VITALS — BP 123/68 | HR 76 | Ht 72.0 in | Wt 201.8 lb

## 2023-04-01 DIAGNOSIS — K219 Gastro-esophageal reflux disease without esophagitis: Secondary | ICD-10-CM

## 2023-04-01 DIAGNOSIS — F32A Depression, unspecified: Secondary | ICD-10-CM

## 2023-04-01 DIAGNOSIS — T7501XD Shock due to being struck by lightning, subsequent encounter: Secondary | ICD-10-CM

## 2023-04-01 DIAGNOSIS — K760 Fatty (change of) liver, not elsewhere classified: Secondary | ICD-10-CM

## 2023-04-01 DIAGNOSIS — F419 Anxiety disorder, unspecified: Secondary | ICD-10-CM

## 2023-04-01 DIAGNOSIS — I10 Essential (primary) hypertension: Secondary | ICD-10-CM

## 2023-04-01 DIAGNOSIS — Z122 Encounter for screening for malignant neoplasm of respiratory organs: Secondary | ICD-10-CM

## 2023-04-01 DIAGNOSIS — E11618 Type 2 diabetes mellitus with other diabetic arthropathy: Secondary | ICD-10-CM | POA: Diagnosis not present

## 2023-04-01 DIAGNOSIS — G894 Chronic pain syndrome: Secondary | ICD-10-CM

## 2023-04-01 DIAGNOSIS — I671 Cerebral aneurysm, nonruptured: Secondary | ICD-10-CM | POA: Insufficient documentation

## 2023-04-01 DIAGNOSIS — Z23 Encounter for immunization: Secondary | ICD-10-CM

## 2023-04-01 DIAGNOSIS — N138 Other obstructive and reflux uropathy: Secondary | ICD-10-CM

## 2023-04-01 DIAGNOSIS — E1169 Type 2 diabetes mellitus with other specified complication: Secondary | ICD-10-CM

## 2023-04-01 DIAGNOSIS — S61452A Open bite of left hand, initial encounter: Secondary | ICD-10-CM | POA: Insufficient documentation

## 2023-04-01 DIAGNOSIS — Z720 Tobacco use: Secondary | ICD-10-CM

## 2023-04-01 DIAGNOSIS — N401 Enlarged prostate with lower urinary tract symptoms: Secondary | ICD-10-CM

## 2023-04-01 DIAGNOSIS — G629 Polyneuropathy, unspecified: Secondary | ICD-10-CM

## 2023-04-01 DIAGNOSIS — G8929 Other chronic pain: Secondary | ICD-10-CM | POA: Insufficient documentation

## 2023-04-01 DIAGNOSIS — E785 Hyperlipidemia, unspecified: Secondary | ICD-10-CM | POA: Insufficient documentation

## 2023-04-01 DIAGNOSIS — G40909 Epilepsy, unspecified, not intractable, without status epilepticus: Secondary | ICD-10-CM | POA: Diagnosis not present

## 2023-04-01 DIAGNOSIS — G6289 Other specified polyneuropathies: Secondary | ICD-10-CM

## 2023-04-01 HISTORY — DX: Cerebral aneurysm, nonruptured: I67.1

## 2023-04-01 HISTORY — DX: Polyneuropathy, unspecified: G62.9

## 2023-04-01 MED ORDER — AMOXICILLIN-POT CLAVULANATE 875-125 MG PO TABS: 1.0000 | ORAL_TABLET | Freq: Two times a day (BID) | ORAL | 0 refills | Status: AC

## 2023-04-01 NOTE — Assessment & Plan Note (Signed)
5 x 4 mm ACA aneurysm noted on MRI brain obtained in the setting of recent seizure activity.  He has been referred to a neurointerventionalist and has an appointment scheduled for 8/12.

## 2023-04-01 NOTE — Patient Instructions (Signed)
It was a pleasure to see you today.  Thank you for giving [place] the opportunity to be involved in your care.  Below is a brief recap of your visit and next steps.  We will plan to see you again in 3 months.  Summary You have established care today We will check basic labs Referrals have been placed for pain management and for lung cancer screening  Augmentin prescribed for cat bite on left hand Follow up in 3 months

## 2023-04-01 NOTE — Assessment & Plan Note (Signed)
Symptoms are adequately controlled with Pepcid.  No medication changes are indicated today.
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
## 2023-04-01 NOTE — Assessment & Plan Note (Signed)
BP is adequately controlled with losartan 100 mg daily and amlodipine 5 mg daily.  No medication changes are indicated today.

## 2023-04-01 NOTE — Assessment & Plan Note (Signed)
Symptoms are adequately controlled with gabapentin 800 mg 3 times daily.
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
## 2023-04-01 NOTE — Assessment & Plan Note (Signed)
Previously documented history of seizure disorder.  He is currently prescribed Topamax and gabapentin.  Last seizure event occurred in May.  He was recently evaluated by neurology and has follow-up scheduled for next week (8/5). -Driving restrictions were reinforced

## 2023-04-01 NOTE — Assessment & Plan Note (Signed)
His cat bit him on the dorsum of his left hand yesterday (7/31).  He has cleaned the wound and soaked it in Epsom salt.  He has also tried applying Neosporin.  Puncture wound is evident on exam today with surrounding erythema.  No purulence is appreciated.  The left hand is neurovascularly intact.  Augmentin x 5 days has been prescribed for antibiotic prophylaxis.  He was instructed return to care if erythema does not resolve within the next 4-5 days.

## 2023-04-01 NOTE — Assessment & Plan Note (Signed)
PHQ-9 score is elevated today.  Denies SI/HI.  He is currently prescribed Celexa and Cymbalta.  He has previously been referred to psychiatry and states that he has an appointment scheduled for later this month.  He was previously prescribed benzodiazepines as well and is interested in restarting treatment for anxiety.  He plans to discuss this with psychiatry.

## 2023-04-01 NOTE — Assessment & Plan Note (Signed)
Currently smokes 0.5 packs/day and has been smoking since age 67.  He is attempting to quit but is not interested in NRT products. -The patient was counseled on the dangers of tobacco use, and was advised to quit.  Reviewed strategies to maximize success, including removing cigarettes and smoking materials from environment, stress management, substitution of other forms of reinforcement, support of family/friends, and written materials. -Lung cancer screening referral placed today

## 2023-04-01 NOTE — Assessment & Plan Note (Signed)
No recent lipid panel on file.  He is currently prescribed simvastatin 80 mg daily.  Repeat lipid panel ordered today.

## 2023-04-01 NOTE — Assessment & Plan Note (Signed)
Symptoms are adequately controlled with Uroxatrol and Flomax.  Followed by urology in [place].

## 2023-04-01 NOTE — Assessment & Plan Note (Signed)
Diet controlled.  Reports that his last A1c was 5.7.  He is on ARB and statin therapy. -Repeat A1c and urine microalbumin/creatinine ratio have been ordered today

## 2023-04-02 ENCOUNTER — Other Ambulatory Visit: Payer: Self-pay | Admitting: Internal Medicine

## 2023-04-02 DIAGNOSIS — E1169 Type 2 diabetes mellitus with other specified complication: Secondary | ICD-10-CM

## 2023-04-02 MED ORDER — ATORVASTATIN CALCIUM 40 MG PO TABS
40.0000 mg | ORAL_TABLET | Freq: Every day | ORAL | 3 refills | Status: DC
Start: 2023-04-02 — End: 2023-05-07

## 2023-04-05 ENCOUNTER — Ambulatory Visit: Payer: Medicare HMO | Admitting: Neurology

## 2023-04-05 ENCOUNTER — Telehealth: Payer: Self-pay | Admitting: Neurology

## 2023-04-05 ENCOUNTER — Encounter: Payer: Self-pay | Admitting: Neurology

## 2023-04-05 VITALS — BP 110/66 | HR 84 | Resp 20 | Ht 72.0 in | Wt 194.0 lb

## 2023-04-05 DIAGNOSIS — R569 Unspecified convulsions: Secondary | ICD-10-CM

## 2023-04-05 DIAGNOSIS — I729 Aneurysm of unspecified site: Secondary | ICD-10-CM

## 2023-04-05 DIAGNOSIS — G43009 Migraine without aura, not intractable, without status migrainosus: Secondary | ICD-10-CM

## 2023-04-05 MED ORDER — AIMOVIG 140 MG/ML ~~LOC~~ SOAJ: 1.0000 | SUBCUTANEOUS | 11 refills | Status: DC
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
## 2023-04-05 NOTE — Telephone Encounter (Signed)
Patient states that you were checking with [facility] about CT scan, he  has not heard from them and wants to know if you will call him when you find out why they have not called him .

## 2023-04-06 ENCOUNTER — Other Ambulatory Visit: Payer: Self-pay

## 2023-04-06 NOTE — Progress Notes (Unsigned)
CT ANGIO head and neck were approved # [id number]   Tracking # [id number]

## 2023-04-06 NOTE — Telephone Encounter (Signed)
Called pt and told him that Brian [name] will be calling them to make an appointment. He understood. CTA head and neck were approved.

## 2023-04-08 ENCOUNTER — Encounter: Payer: Self-pay | Admitting: Physical Medicine & Rehabilitation

## 2023-04-09 ENCOUNTER — Ambulatory Visit (HOSPITAL_BASED_OUTPATIENT_CLINIC_OR_DEPARTMENT_OTHER): Payer: Medicare HMO

## 2023-04-09 NOTE — Telephone Encounter (Signed)
Called pt and he reported that [facility] to [place]. The approval was for [facility]. I told him that the approval was for [facility] and it has to be changed due to NPI different. Called [facility] and they just said yah he was changed to [facility]. I asked who changed it because the approval would have needed to be changed. ( No one called me about this)He did not know because they do not do approvals. I was just trying to find out what happened because the last I knew was I got it approved and called [facility] and they were going to call Pt for appointment 3 days ago. The man at [facility] said he would call pt and get him into [place] or [place] next week.

## 2023-04-09 NOTE — Telephone Encounter (Signed)
Pt states he was to have a CT scan done today but it was not auth and the appt was canceled. He wants to speak to someone about this  please call him    He was to have it done at the [place] location

## 2023-04-13 ENCOUNTER — Telehealth: Payer: Self-pay | Admitting: Neurology

## 2023-04-13 NOTE — Telephone Encounter (Signed)
Caller stated pharmacy needed PA for medication. Brian [name] (AIMOVIG) [facility]

## 2023-04-15 ENCOUNTER — Telehealth: Payer: Self-pay | Admitting: Pharmacy Technician

## 2023-04-15 ENCOUNTER — Other Ambulatory Visit (HOSPITAL_COMMUNITY): Payer: Self-pay

## 2023-04-15 NOTE — Telephone Encounter (Signed)
Pharmacy Patient Advocate Encounter   Received notification from CoverMyMeds that prior authorization for AIMOVIG 140MG  is required/requested.   Insurance verification completed.   The patient is insured through [employer] .   Per test claim: PA required; PA submitted to [facility] via CoverMyMeds Key/confirmation #/EOC [id number] Status is pending

## 2023-04-16 ENCOUNTER — Other Ambulatory Visit (HOSPITAL_COMMUNITY): Payer: Self-pay | Admitting: Neurosurgery

## 2023-04-16 DIAGNOSIS — I671 Cerebral aneurysm, nonruptured: Secondary | ICD-10-CM

## 2023-04-19 ENCOUNTER — Other Ambulatory Visit: Payer: Self-pay | Admitting: Neurosurgery

## 2023-04-20 ENCOUNTER — Other Ambulatory Visit: Payer: Self-pay | Admitting: Neurosurgery

## 2023-04-23 ENCOUNTER — Other Ambulatory Visit: Payer: Self-pay

## 2023-04-23 ENCOUNTER — Other Ambulatory Visit (HOSPITAL_COMMUNITY): Payer: Self-pay | Admitting: Neurosurgery

## 2023-04-23 ENCOUNTER — Ambulatory Visit (HOSPITAL_COMMUNITY)
Admission: RE | Admit: 2023-04-23 | Discharge: 2023-04-23 | Disposition: A | Discharge status: Home / Self Care | Payer: Medicare HMO | Source: Ambulatory Visit | Attending: Neurosurgery | Admitting: Neurosurgery

## 2023-04-23 DIAGNOSIS — I671 Cerebral aneurysm, nonruptured: Secondary | ICD-10-CM | POA: Diagnosis present

## 2023-04-23 DIAGNOSIS — F17210 Nicotine dependence, cigarettes, uncomplicated: Secondary | ICD-10-CM | POA: Diagnosis not present

## 2023-04-23 DIAGNOSIS — R7303 Prediabetes: Secondary | ICD-10-CM | POA: Insufficient documentation

## 2023-04-23 DIAGNOSIS — Z7982 Long term (current) use of aspirin: Secondary | ICD-10-CM | POA: Diagnosis not present

## 2023-04-23 DIAGNOSIS — I10 Essential (primary) hypertension: Secondary | ICD-10-CM | POA: Diagnosis not present

## 2023-04-23 HISTORY — PX: IR ANGIO INTRA EXTRACRAN SEL INTERNAL CAROTID BILAT MOD SED: IMG5363

## 2023-04-23 HISTORY — PX: IR ANGIO VERTEBRAL SEL VERTEBRAL UNI R MOD SED: IMG5368

## 2023-04-23 LAB — BASIC METABOLIC PANEL
Anion gap: 12 (ref 5–15)
BUN: 27 mg/dL — ABNORMAL HIGH (ref 8–23)
CO2: 21 mmol/L — ABNORMAL LOW (ref 22–32)
Calcium: 8.7 mg/dL — ABNORMAL LOW (ref 8.9–10.3)
Chloride: 109 mmol/L (ref 98–111)
Creatinine, Ser: 1.68 mg/dL — ABNORMAL HIGH (ref 0.61–1.24)
GFR, Estimated: 44 mL/min — ABNORMAL LOW (ref >60)
Glucose, Bld: 109 mg/dL — ABNORMAL HIGH (ref 70–99)
Potassium: 3.9 mmol/L (ref 3.5–5.1)
Sodium: 142 mmol/L (ref 135–145)

## 2023-04-23 LAB — CBC WITH DIFFERENTIAL/PLATELET
Abs Immature Granulocytes: 0.02 10*3/uL (ref 0.00–0.07)
Basophils Absolute: 0.1 10*3/uL (ref 0.0–0.1)
Basophils Relative: 1 %
Eosinophils Absolute: 0.4 10*3/uL (ref 0.0–0.5)
Eosinophils Relative: 5 %
HCT: 38.5 % — ABNORMAL LOW (ref 39.0–52.0)
Hemoglobin: 12.4 g/dL — ABNORMAL LOW (ref 13.0–17.0)
Immature Granulocytes: 0 %
Lymphocytes Relative: 23 %
Lymphs Abs: 2 10*3/uL (ref 0.7–4.0)
MCH: 31.5 pg (ref 26.0–34.0)
MCHC: 32.2 g/dL (ref 30.0–36.0)
MCV: 97.7 fL (ref 80.0–100.0)
Monocytes Absolute: 0.9 10*3/uL (ref 0.1–1.0)
Monocytes Relative: 11 %
Neutro Abs: 5 10*3/uL (ref 1.7–7.7)
Neutrophils Relative %: 60 %
Platelets: 207 10*3/uL (ref 150–400)
RBC: 3.94 MIL/uL — ABNORMAL LOW (ref 4.22–5.81)
RDW: 13.2 % (ref 11.5–15.5)
WBC: 8.4 10*3/uL (ref 4.0–10.5)
nRBC: 0 % (ref 0.0–0.2)

## 2023-04-23 LAB — PROTIME-INR
INR: 1.1 (ref 0.8–1.2)
Prothrombin Time: 14.2 seconds (ref 11.4–15.2)

## 2023-04-23 LAB — APTT: aPTT: 30 seconds (ref 24–36)

## 2023-04-23 MED ORDER — LIDOCAINE HCL 1 % IJ SOLN
INTRAMUSCULAR | Status: AC
  Filled 2023-04-23: qty 20

## 2023-04-23 MED ORDER — SODIUM CHLORIDE 0.9 % IV SOLN
INTRAVENOUS | Status: AC | PRN
  Administered 2023-04-23: 10 mL/h via INTRAVENOUS

## 2023-04-23 MED ORDER — CHLORHEXIDINE GLUCONATE CLOTH 2 % EX PADS: 6.0000 | MEDICATED_PAD | Freq: Once | CUTANEOUS | Status: DC

## 2023-04-23 MED ORDER — FENTANYL CITRATE (PF) 100 MCG/2ML IJ SOLN
INTRAMUSCULAR | Status: AC
  Filled 2023-04-23: qty 2

## 2023-04-23 MED ORDER — SODIUM CHLORIDE 0.9 % IV SOLN: INTRAVENOUS | Status: DC

## 2023-04-23 MED ORDER — MIDAZOLAM HCL 2 MG/2ML IJ SOLN
INTRAMUSCULAR | Status: AC
  Filled 2023-04-23: qty 2

## 2023-04-23 MED ORDER — HEPARIN SODIUM (PORCINE) 1000 UNIT/ML IJ SOLN
INTRAMUSCULAR | Status: AC
  Filled 2023-04-23: qty 10

## 2023-04-23 MED ORDER — CEFAZOLIN SODIUM-DEXTROSE 2-4 GM/100ML-% IV SOLN: 2.0000 g | INTRAVENOUS | Status: DC

## 2023-04-23 MED ORDER — HEPARIN SODIUM (PORCINE) 1000 UNIT/ML IJ SOLN
INTRAMUSCULAR | Status: AC | PRN
  Administered 2023-04-23: 2000 [IU] via INTRAVENOUS

## 2023-04-23 MED ORDER — HYDROCODONE-ACETAMINOPHEN 5-325 MG PO TABS: 1.0000 | ORAL_TABLET | ORAL | Status: DC | PRN

## 2023-04-23 NOTE — Progress Notes (Signed)
Patient walked to the bathroom no difficulties. Right groin site level 0, clean, dry, and intact.
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
## 2023-04-28 ENCOUNTER — Other Ambulatory Visit (HOSPITAL_COMMUNITY): Payer: Self-pay

## 2023-04-28 ENCOUNTER — Other Ambulatory Visit: Payer: Self-pay

## 2023-04-28 MED ORDER — AIMOVIG 140 MG/ML ~~LOC~~ SOAJ: 1.0000 | SUBCUTANEOUS | 11 refills | Status: DC

## 2023-04-28 NOTE — Telephone Encounter (Signed)
Pharmacy Patient Advocate Encounter  Received notification from [facility] that Prior Authorization for Aimovig 140MG /ML auto-injectors has been APPROVED from 04-15-2023 to 08-31-2023   PA #/Case ID/Reference #: [id number]

## 2023-05-05 ENCOUNTER — Other Ambulatory Visit: Payer: Self-pay

## 2023-05-07 ENCOUNTER — Telehealth: Payer: Self-pay | Admitting: Internal Medicine

## 2023-05-07 ENCOUNTER — Other Ambulatory Visit: Payer: Self-pay

## 2023-05-07 DIAGNOSIS — E1169 Type 2 diabetes mellitus with other specified complication: Secondary | ICD-10-CM

## 2023-05-07 MED ORDER — ONDANSETRON HCL 8 MG PO TABS: 8.0000 mg | ORAL_TABLET | Freq: Two times a day (BID) | ORAL | 0 refills | Status: DC | PRN

## 2023-05-07 MED ORDER — GABAPENTIN 800 MG PO TABS: 800.0000 mg | ORAL_TABLET | Freq: Three times a day (TID) | ORAL | 3 refills | Status: DC

## 2023-05-07 MED ORDER — ATORVASTATIN CALCIUM 40 MG PO TABS
40.0000 mg | ORAL_TABLET | Freq: Every day | ORAL | 3 refills | Status: DC
Start: 2023-05-07 — End: 2023-12-28

## 2023-05-07 NOTE — Telephone Encounter (Signed)
Prescription Request  05/07/2023  LOV: 04/01/2023  What is the name of the medication or equipment? ondansetron (ZOFRAN) 8 MG tablet [[id number]]  gabapentin (NEURONTIN) 800 MG tablet [[id number]]   simvastatin (ZOCOR) 80 MG tablet [[id number]]  DISCONTINUED    Have you contacted your pharmacy to request a refill? Yes    [facility] - [place], [place] - [address] [place] [address] Phone: [phone] Fax: [phone]    Patient notified that their request is being sent to the clinical staff for review and that they should receive a response within 2 business days.   Please advise at [facility] [phone]

## 2023-05-07 NOTE — Telephone Encounter (Signed)
Refills sent

## 2023-05-18 ENCOUNTER — Encounter: Payer: Medicare HMO | Admitting: Physical Medicine & Rehabilitation

## 2023-05-21 ENCOUNTER — Other Ambulatory Visit: Payer: Self-pay | Admitting: Neurosurgery

## 2023-05-24 ENCOUNTER — Telehealth: Payer: Self-pay | Admitting: Internal Medicine

## 2023-05-24 NOTE — Telephone Encounter (Signed)
Pt LVM says he is returning a call

## 2023-05-24 NOTE — Telephone Encounter (Signed)
Do not see where anyone has tried to call patient

## 2023-05-28 NOTE — Pre-Procedure Instructions (Signed)
Surgical Instructions   Your procedure is scheduled on Tuesday, October 8th. Report to [facility] Main Entrance "A" at 08:15 A.M., then check in with the Admitting office. Any questions or running late day of surgery: call [phone]  Questions prior to your surgery date: call [phone], Monday-Friday, 8am-4pm. If you experience any cold or flu symptoms such as cough, fever, chills, shortness of breath, etc. between now and your scheduled surgery, please notify us at the above number.     Remember:  Do not eat or drink after midnight the night before your surgery     Take these medicines the morning of surgery with A SIP OF WATER  alfuzosin (UROXATRAL)  amLODipine (NORVASC)  atorvastatin (LIPITOR)  DULoxetine (CYMBALTA)  famotidine (PEPCID)  gabapentin (NEURONTIN)  pantoprazole (PROTONIX)  tamsulosin (FLOMAX)  topiramate (TOPAMAX)    May take these medicines IF NEEDED: azelastine (ASTELIN)  diphenhydrAMINE (BENADRYL)  fluticasone (FLONASE)  meclizine (ANTIVERT)  ondansetron (ZOFRAN)  Polyethyl Glycol-Propyl Glycol (SYSTANE OP)    Follow your surgeon's instructions on when to stop Asprin.  If no instructions were given by your surgeon then you will need to call the office to get those instructions.     One week prior to surgery, STOP taking any Aleve, Naproxen, Ibuprofen, Motrin, Advil, Goody's, BC's, all herbal medications, fish oil, and non-prescription vitamins.                     Do NOT Smoke (Tobacco/Vaping) for 24 hours prior to your procedure.  If you use a CPAP at night, you may bring your mask/headgear for your overnight stay.   You will be asked to remove any contacts, glasses, piercing's, hearing aid's, dentures/partials prior to surgery. Please bring cases for these items if needed.    Patients discharged the day of surgery will not be allowed to drive home, and someone needs to stay with them for 24 hours.  SURGICAL WAITING ROOM VISITATION Patients  may have no more than 2 support people in the waiting area - these visitors may rotate.   Pre-op nurse will coordinate an appropriate time for 1 ADULT support person, who may not rotate, to accompany patient in pre-op.  Children under the age of 18 must have an adult with them who is not the patient and must remain in the main waiting area with an adult.  If the patient needs to stay at the hospital during part of their recovery, the visitor guidelines for inpatient rooms apply.  Please refer to the [facility] website for the visitor guidelines for any additional information.   If you received a COVID test during your pre-op visit  it is requested that you wear a mask when out in public, stay away from anyone that may not be feeling well and notify your surgeon if you develop symptoms. If you have been in contact with anyone that has tested positive in the last 10 days please notify you surgeon.      Pre-operative CHG Bathing Instructions   You can play a key role in reducing the risk of infection after surgery. Your skin needs to be as free of germs as possible. You can reduce the number of germs on your skin by washing with CHG (chlorhexidine gluconate) soap before surgery. CHG is an antiseptic soap that kills germs and continues to kill germs even after washing.   DO NOT use if you have an allergy to chlorhexidine/CHG or antibacterial soaps. If your skin becomes reddened or  irritated, stop using the CHG and notify one of our RNs at [phone].              TAKE A SHOWER THE NIGHT BEFORE SURGERY AND THE DAY OF SURGERY    Please keep in mind the following:  DO NOT shave, including legs and underarms, 48 hours prior to surgery.   You may shave your face before/day of surgery.  Place clean sheets on your bed the night before surgery Use a clean washcloth (not used since being washed) for each shower. DO NOT sleep with pet's night before surgery.  CHG Shower Instructions:  Wash your  face and private area with normal soap. If you choose to wash your hair, wash first with your normal shampoo.  After you use shampoo/soap, rinse your hair and body thoroughly to remove shampoo/soap residue.  Turn the water OFF and apply half the bottle of CHG soap to a CLEAN washcloth.  Apply CHG soap ONLY FROM YOUR NECK DOWN TO YOUR TOES (washing for 3-5 minutes)  DO NOT use CHG soap on face, private areas, open wounds, or sores.  Pay special attention to the area where your surgery is being performed.  If you are having back surgery, having someone wash your back for you may be helpful. Wait 2 minutes after CHG soap is applied, then you may rinse off the CHG soap.  Pat dry with a clean towel  Put on clean pajamas    Additional instructions for the day of surgery: DO NOT APPLY any lotions, deodorants, cologne, or perfumes.   Do not wear jewelry or makeup Do not wear nail polish, gel polish, artificial nails, or any other type of covering on natural nails (fingers and toes) Do not bring valuables to the hospital. [facility] is not responsible for valuables/personal belongings. Put on clean/comfortable clothes.  Please brush your teeth.  Ask your nurse before applying any prescription medications to the skin.

## 2023-05-31 ENCOUNTER — Other Ambulatory Visit: Payer: Self-pay

## 2023-05-31 ENCOUNTER — Encounter (HOSPITAL_COMMUNITY)
Admission: RE | Admit: 2023-05-31 | Discharge: 2023-05-31 | Disposition: A | Discharge status: Home / Self Care | Payer: Medicare HMO | Source: Ambulatory Visit | Attending: Neurosurgery | Admitting: Neurosurgery

## 2023-05-31 ENCOUNTER — Encounter (HOSPITAL_COMMUNITY): Payer: Self-pay

## 2023-05-31 VITALS — BP 133/63 | HR 66 | Temp 98.2°F | Resp 17 | Ht 72.0 in | Wt 207.8 lb

## 2023-05-31 DIAGNOSIS — Z01812 Encounter for preprocedural laboratory examination: Secondary | ICD-10-CM | POA: Insufficient documentation

## 2023-05-31 DIAGNOSIS — E119 Type 2 diabetes mellitus without complications: Secondary | ICD-10-CM | POA: Insufficient documentation

## 2023-05-31 DIAGNOSIS — Z01818 Encounter for other preprocedural examination: Secondary | ICD-10-CM

## 2023-05-31 DIAGNOSIS — K760 Fatty (change of) liver, not elsewhere classified: Secondary | ICD-10-CM | POA: Insufficient documentation

## 2023-05-31 HISTORY — DX: Fatty (change of) liver, not elsewhere classified: K76.0

## 2023-05-31 HISTORY — DX: Cardiac murmur, unspecified: R01.1

## 2023-05-31 HISTORY — DX: Personal history of other diseases of the digestive system: Z87.19

## 2023-05-31 LAB — COMPREHENSIVE METABOLIC PANEL
ALT: 24 U/L (ref 0–44)
AST: 18 U/L (ref 15–41)
Albumin: 3.8 g/dL (ref 3.5–5.0)
Alkaline Phosphatase: 59 U/L (ref 38–126)
Anion gap: 7 (ref 5–15)
BUN: 24 mg/dL — ABNORMAL HIGH (ref 8–23)
CO2: 22 mmol/L (ref 22–32)
Calcium: 9 mg/dL (ref 8.9–10.3)
Chloride: 112 mmol/L — ABNORMAL HIGH (ref 98–111)
Creatinine, Ser: 1.73 mg/dL — ABNORMAL HIGH (ref 0.61–1.24)
GFR, Estimated: 43 mL/min — ABNORMAL LOW (ref >60)
Glucose, Bld: 109 mg/dL — ABNORMAL HIGH (ref 70–99)
Potassium: 4.3 mmol/L (ref 3.5–5.1)
Sodium: 141 mmol/L (ref 135–145)
Total Bilirubin: 0.5 mg/dL (ref 0.3–1.2)
Total Protein: 7.1 g/dL (ref 6.5–8.1)

## 2023-05-31 LAB — TYPE AND SCREEN
ABO/RH(D): A POS
Antibody Screen: NEGATIVE

## 2023-05-31 LAB — CBC
HCT: 41.5 % (ref 39.0–52.0)
Hemoglobin: 13.2 g/dL (ref 13.0–17.0)
MCH: 31.5 pg (ref 26.0–34.0)
MCHC: 31.8 g/dL (ref 30.0–36.0)
MCV: 99 fL (ref 80.0–100.0)
Platelets: 191 10*3/uL (ref 150–400)
RBC: 4.19 MIL/uL — ABNORMAL LOW (ref 4.22–5.81)
RDW: 13.2 % (ref 11.5–15.5)
WBC: 13.2 10*3/uL — ABNORMAL HIGH (ref 4.0–10.5)
nRBC: 0 % (ref 0.0–0.2)

## 2023-05-31 LAB — HEMOGLOBIN A1C
Hgb A1c MFr Bld: 6 % — ABNORMAL HIGH (ref 4.8–5.6)
Mean Plasma Glucose: 125.5 mg/dL

## 2023-05-31 LAB — GLUCOSE, CAPILLARY: Glucose-Capillary: 108 mg/dL — ABNORMAL HIGH (ref 70–99)

## 2023-05-31 NOTE — Progress Notes (Signed)
PCP - Dr. Christel Mormon Cardiologist - denies  PPM/ICD - denies   Chest x-ray - 12/31/22 EKG - 12/31/22 Stress Test - denies ECHO - 02/13/20 Cardiac Cath - 07/09/17  Sleep Study - OSA+ per pt CPAP - denies, pt states he was unable to tolerate  Fasting Blood Sugar - 100-120 Checks Blood Sugar once a day  Last dose of GLP1 agonist-  n/a   Blood Thinner Instructions: n/a Aspirin Instructions: Hold 7 days. Last dose 9/30  ERAS Protcol - no, NPO   COVID TEST- n/a   Anesthesia review: no  Patient denies shortness of breath, fever, cough and chest pain at PAT appointment   All instructions explained to the patient, with a verbal understanding of the material. Patient agrees to go over the instructions while at home for a better understanding.  The opportunity to ask questions was provided.

## 2023-06-04 ENCOUNTER — Other Ambulatory Visit: Payer: Self-pay | Admitting: Neurosurgery

## 2023-06-08 ENCOUNTER — Inpatient Hospital Stay (HOSPITAL_COMMUNITY)
Admission: RE | Admit: 2023-06-08 | Discharge: 2023-07-05 | DRG: 026 | Disposition: A | Discharge status: Skilled Nursing Facility | Payer: Medicare HMO | Attending: Neurosurgery | Admitting: Neurosurgery

## 2023-06-08 ENCOUNTER — Encounter (HOSPITAL_COMMUNITY)
Admission: RE | Disposition: A | Discharge status: Skilled Nursing Facility | Payer: Self-pay | Source: Home / Self Care | Attending: Neurosurgery

## 2023-06-08 ENCOUNTER — Encounter (HOSPITAL_COMMUNITY): Payer: Self-pay | Admitting: Neurosurgery

## 2023-06-08 ENCOUNTER — Other Ambulatory Visit: Payer: Self-pay

## 2023-06-08 ENCOUNTER — Inpatient Hospital Stay (HOSPITAL_COMMUNITY): Payer: Medicare HMO | Admitting: Anesthesiology

## 2023-06-08 DIAGNOSIS — G4733 Obstructive sleep apnea (adult) (pediatric): Secondary | ICD-10-CM | POA: Diagnosis present

## 2023-06-08 DIAGNOSIS — E876 Hypokalemia: Secondary | ICD-10-CM | POA: Diagnosis present

## 2023-06-08 DIAGNOSIS — K760 Fatty (change of) liver, not elsewhere classified: Secondary | ICD-10-CM | POA: Diagnosis present

## 2023-06-08 DIAGNOSIS — G40909 Epilepsy, unspecified, not intractable, without status epilepticus: Secondary | ICD-10-CM | POA: Diagnosis present

## 2023-06-08 DIAGNOSIS — F17210 Nicotine dependence, cigarettes, uncomplicated: Secondary | ICD-10-CM | POA: Diagnosis present

## 2023-06-08 DIAGNOSIS — I671 Cerebral aneurysm, nonruptured: Secondary | ICD-10-CM | POA: Diagnosis present

## 2023-06-08 DIAGNOSIS — I4892 Unspecified atrial flutter: Secondary | ICD-10-CM | POA: Diagnosis present

## 2023-06-08 DIAGNOSIS — I471 Supraventricular tachycardia, unspecified: Secondary | ICD-10-CM | POA: Diagnosis not present

## 2023-06-08 DIAGNOSIS — E1122 Type 2 diabetes mellitus with diabetic chronic kidney disease: Secondary | ICD-10-CM | POA: Diagnosis present

## 2023-06-08 DIAGNOSIS — R609 Edema, unspecified: Secondary | ICD-10-CM | POA: Diagnosis not present

## 2023-06-08 DIAGNOSIS — G43909 Migraine, unspecified, not intractable, without status migrainosus: Secondary | ICD-10-CM | POA: Diagnosis present

## 2023-06-08 DIAGNOSIS — Z9889 Other specified postprocedural states: Secondary | ICD-10-CM

## 2023-06-08 DIAGNOSIS — R569 Unspecified convulsions: Secondary | ICD-10-CM | POA: Diagnosis not present

## 2023-06-08 DIAGNOSIS — Z881 Allergy status to other antibiotic agents status: Secondary | ICD-10-CM

## 2023-06-08 DIAGNOSIS — I4891 Unspecified atrial fibrillation: Secondary | ICD-10-CM | POA: Diagnosis present

## 2023-06-08 DIAGNOSIS — K219 Gastro-esophageal reflux disease without esophagitis: Secondary | ICD-10-CM | POA: Diagnosis present

## 2023-06-08 DIAGNOSIS — Z7982 Long term (current) use of aspirin: Secondary | ICD-10-CM

## 2023-06-08 DIAGNOSIS — F05 Delirium due to known physiological condition: Secondary | ICD-10-CM | POA: Diagnosis not present

## 2023-06-08 DIAGNOSIS — I129 Hypertensive chronic kidney disease with stage 1 through stage 4 chronic kidney disease, or unspecified chronic kidney disease: Secondary | ICD-10-CM | POA: Diagnosis present

## 2023-06-08 DIAGNOSIS — K3184 Gastroparesis: Secondary | ICD-10-CM | POA: Diagnosis present

## 2023-06-08 DIAGNOSIS — F419 Anxiety disorder, unspecified: Secondary | ICD-10-CM | POA: Diagnosis present

## 2023-06-08 DIAGNOSIS — H02402 Unspecified ptosis of left eyelid: Secondary | ICD-10-CM | POA: Diagnosis not present

## 2023-06-08 DIAGNOSIS — F32A Depression, unspecified: Secondary | ICD-10-CM | POA: Diagnosis present

## 2023-06-08 DIAGNOSIS — G8929 Other chronic pain: Secondary | ICD-10-CM | POA: Diagnosis present

## 2023-06-08 DIAGNOSIS — Z8711 Personal history of peptic ulcer disease: Secondary | ICD-10-CM

## 2023-06-08 DIAGNOSIS — Z8679 Personal history of other diseases of the circulatory system: Secondary | ICD-10-CM

## 2023-06-08 DIAGNOSIS — Z809 Family history of malignant neoplasm, unspecified: Secondary | ICD-10-CM

## 2023-06-08 DIAGNOSIS — Z888 Allergy status to other drugs, medicaments and biological substances status: Secondary | ICD-10-CM

## 2023-06-08 DIAGNOSIS — Z9089 Acquired absence of other organs: Secondary | ICD-10-CM

## 2023-06-08 DIAGNOSIS — Z781 Physical restraint status: Secondary | ICD-10-CM | POA: Diagnosis not present

## 2023-06-08 DIAGNOSIS — Z8673 Personal history of transient ischemic attack (TIA), and cerebral infarction without residual deficits: Secondary | ICD-10-CM

## 2023-06-08 DIAGNOSIS — Z1152 Encounter for screening for COVID-19: Secondary | ICD-10-CM

## 2023-06-08 DIAGNOSIS — E785 Hyperlipidemia, unspecified: Secondary | ICD-10-CM | POA: Diagnosis present

## 2023-06-08 DIAGNOSIS — R338 Other retention of urine: Secondary | ICD-10-CM | POA: Diagnosis present

## 2023-06-08 DIAGNOSIS — E669 Obesity, unspecified: Secondary | ICD-10-CM | POA: Diagnosis present

## 2023-06-08 DIAGNOSIS — Z8619 Personal history of other infectious and parasitic diseases: Secondary | ICD-10-CM

## 2023-06-08 DIAGNOSIS — Z9049 Acquired absence of other specified parts of digestive tract: Secondary | ICD-10-CM

## 2023-06-08 DIAGNOSIS — Z9841 Cataract extraction status, right eye: Secondary | ICD-10-CM

## 2023-06-08 DIAGNOSIS — K589 Irritable bowel syndrome without diarrhea: Secondary | ICD-10-CM | POA: Diagnosis present

## 2023-06-08 DIAGNOSIS — Z833 Family history of diabetes mellitus: Secondary | ICD-10-CM

## 2023-06-08 DIAGNOSIS — R4182 Altered mental status, unspecified: Secondary | ICD-10-CM | POA: Diagnosis not present

## 2023-06-08 DIAGNOSIS — Z79899 Other long term (current) drug therapy: Secondary | ICD-10-CM

## 2023-06-08 DIAGNOSIS — E119 Type 2 diabetes mellitus without complications: Secondary | ICD-10-CM | POA: Diagnosis not present

## 2023-06-08 DIAGNOSIS — Z886 Allergy status to analgesic agent status: Secondary | ICD-10-CM

## 2023-06-08 DIAGNOSIS — Z860101 Personal history of adenomatous and serrated colon polyps: Secondary | ICD-10-CM

## 2023-06-08 DIAGNOSIS — G9389 Other specified disorders of brain: Secondary | ICD-10-CM | POA: Diagnosis present

## 2023-06-08 DIAGNOSIS — N1832 Chronic kidney disease, stage 3b: Secondary | ICD-10-CM | POA: Diagnosis present

## 2023-06-08 DIAGNOSIS — Z882 Allergy status to sulfonamides status: Secondary | ICD-10-CM

## 2023-06-08 DIAGNOSIS — E1143 Type 2 diabetes mellitus with diabetic autonomic (poly)neuropathy: Secondary | ICD-10-CM | POA: Diagnosis present

## 2023-06-08 DIAGNOSIS — N401 Enlarged prostate with lower urinary tract symptoms: Secondary | ICD-10-CM | POA: Diagnosis present

## 2023-06-08 DIAGNOSIS — Z9842 Cataract extraction status, left eye: Secondary | ICD-10-CM

## 2023-06-08 DIAGNOSIS — R011 Cardiac murmur, unspecified: Secondary | ICD-10-CM | POA: Diagnosis present

## 2023-06-08 HISTORY — PX: CRANIOTOMY: SHX93

## 2023-06-08 LAB — GLUCOSE, CAPILLARY
Glucose-Capillary: 112 mg/dL — ABNORMAL HIGH (ref 70–99)
Glucose-Capillary: 101 mg/dL — ABNORMAL HIGH (ref 70–99)
Glucose-Capillary: 167 mg/dL — ABNORMAL HIGH (ref 70–99)
Glucose-Capillary: 187 mg/dL — ABNORMAL HIGH (ref 70–99)
Glucose-Capillary: 137 mg/dL — ABNORMAL HIGH (ref 70–99)

## 2023-06-08 LAB — MRSA NEXT GEN BY PCR, NASAL: MRSA by PCR Next Gen: DETECTED — AB

## 2023-06-08 SURGERY — CRANIOTOMY INTRACRANIAL ANEURYSM FOR CAROTID
Anesthesia: General | Laterality: Left

## 2023-06-08 MED ORDER — LABETALOL HCL 5 MG/ML IV SOLN
INTRAVENOUS | Status: AC
  Filled 2023-06-08: qty 4

## 2023-06-08 MED ORDER — SUGAMMADEX SODIUM 200 MG/2ML IV SOLN
INTRAVENOUS | Status: DC | PRN
  Administered 2023-06-08: 200 mg via INTRAVENOUS

## 2023-06-08 MED ORDER — MECLIZINE HCL 25 MG PO TABS: 25.0000 mg | ORAL_TABLET | Freq: Two times a day (BID) | ORAL | Status: DC | PRN

## 2023-06-08 MED ORDER — ONDANSETRON HCL 4 MG/2ML IJ SOLN: 4.0000 mg | Freq: Four times a day (QID) | INTRAMUSCULAR | Status: DC | PRN

## 2023-06-08 MED ORDER — PHENYLEPHRINE HCL (PRESSORS) 10 MG/ML IV SOLN
INTRAVENOUS | Status: AC
  Filled 2023-06-08: qty 1

## 2023-06-08 MED ORDER — SODIUM CHLORIDE 0.9 % IV SOLN: INTRAVENOUS | Status: DC

## 2023-06-08 MED ORDER — ONDANSETRON HCL 4 MG/2ML IJ SOLN
4.0000 mg | INTRAMUSCULAR | Status: DC | PRN
  Administered 2023-06-08: 4 mg via INTRAVENOUS
  Filled 2023-06-08: qty 2

## 2023-06-08 MED ORDER — PANTOPRAZOLE SODIUM 40 MG PO TBEC
40.0000 mg | DELAYED_RELEASE_TABLET | Freq: Two times a day (BID) | ORAL | Status: DC
  Administered 2023-06-09 – 2023-07-05 (×53): 40 mg via ORAL
  Filled 2023-06-08 (×53): qty 1

## 2023-06-08 MED ORDER — LEVETIRACETAM IN NACL 500 MG/100ML IV SOLN
500.0000 mg | Freq: Two times a day (BID) | INTRAVENOUS | Status: DC
  Administered 2023-06-08 – 2023-06-09 (×2): 500 mg via INTRAVENOUS
  Filled 2023-06-08 (×2): qty 100

## 2023-06-08 MED ORDER — PANTOPRAZOLE SODIUM 40 MG PO TBEC: 40.0000 mg | DELAYED_RELEASE_TABLET | Freq: Two times a day (BID) | ORAL | Status: DC

## 2023-06-08 MED ORDER — DEXAMETHASONE SODIUM PHOSPHATE 10 MG/ML IJ SOLN
INTRAMUSCULAR | Status: DC | PRN
  Administered 2023-06-08: 10 mg via INTRAVENOUS

## 2023-06-08 MED ORDER — BACITRACIN ZINC 500 UNIT/GM EX OINT
TOPICAL_OINTMENT | CUTANEOUS | Status: DC | PRN
  Administered 2023-06-08: 1 via TOPICAL

## 2023-06-08 MED ORDER — SODIUM CHLORIDE 0.9 % IV SOLN
12.5000 mg | Freq: Four times a day (QID) | INTRAVENOUS | Status: DC | PRN
  Administered 2023-06-09: 12.5 mg via INTRAVENOUS
  Filled 2023-06-08: qty 12.5

## 2023-06-08 MED ORDER — TOPIRAMATE 100 MG PO TABS
100.0000 mg | ORAL_TABLET | Freq: Every day | ORAL | Status: DC
  Administered 2023-06-09 – 2023-07-05 (×27): 100 mg via ORAL
  Filled 2023-06-08: qty 4
  Filled 2023-06-08 (×2): qty 1
  Filled 2023-06-08: qty 4
  Filled 2023-06-08 (×2): qty 1
  Filled 2023-06-08: qty 4
  Filled 2023-06-08 (×5): qty 1
  Filled 2023-06-08: qty 4
  Filled 2023-06-08: qty 1
  Filled 2023-06-08: qty 4
  Filled 2023-06-08 (×2): qty 1
  Filled 2023-06-08: qty 4
  Filled 2023-06-08 (×3): qty 1
  Filled 2023-06-08 (×2): qty 4
  Filled 2023-06-08 (×2): qty 1
  Filled 2023-06-08: qty 4
  Filled 2023-06-08 (×2): qty 1

## 2023-06-08 MED ORDER — ONDANSETRON HCL 4 MG/2ML IJ SOLN
INTRAMUSCULAR | Status: DC | PRN
  Administered 2023-06-08: 4 mg via INTRAVENOUS

## 2023-06-08 MED ORDER — ALBUMIN HUMAN 5 % IV SOLN
INTRAVENOUS | Status: DC | PRN
Start: 2023-06-08 — End: 2023-06-08

## 2023-06-08 MED ORDER — EPHEDRINE SULFATE-NACL 50-0.9 MG/10ML-% IV SOSY
PREFILLED_SYRINGE | INTRAVENOUS | Status: DC | PRN
  Administered 2023-06-08 (×2): 5 mg via INTRAVENOUS
  Administered 2023-06-08: 10 mg via INTRAVENOUS
  Administered 2023-06-08: 5 mg via INTRAVENOUS
  Administered 2023-06-08: 15 mg via INTRAVENOUS
  Administered 2023-06-08 (×2): 5 mg via INTRAVENOUS

## 2023-06-08 MED ORDER — LABETALOL HCL 5 MG/ML IV SOLN
10.0000 mg | INTRAVENOUS | Status: DC | PRN
  Administered 2023-06-08: 20 mg via INTRAVENOUS
  Administered 2023-06-09: 40 mg via INTRAVENOUS
  Administered 2023-06-09: 20 mg via INTRAVENOUS
  Filled 2023-06-08: qty 4
  Filled 2023-06-08: qty 8
  Filled 2023-06-08: qty 4

## 2023-06-08 MED ORDER — POTASSIUM GLUCONATE 595 (99 K) MG PO TABS: 595.0000 mg | ORAL_TABLET | Freq: Every day | ORAL | Status: DC

## 2023-06-08 MED ORDER — BUPIVACAINE HCL 0.5 % IJ SOLN
INTRAMUSCULAR | Status: DC | PRN
  Administered 2023-06-08: 10 mL

## 2023-06-08 MED ORDER — VITAMIN C 500 MG PO TABS
1000.0000 mg | ORAL_TABLET | Freq: Every day | ORAL | Status: DC
  Administered 2023-06-09 – 2023-07-05 (×27): 1000 mg via ORAL
  Filled 2023-06-08 (×27): qty 2

## 2023-06-08 MED ORDER — THROMBIN 5000 UNITS EX SOLR
CUTANEOUS | Status: AC
  Filled 2023-06-08: qty 5000

## 2023-06-08 MED ORDER — ORAL CARE MOUTH RINSE: 15.0000 mL | Freq: Once | OROMUCOSAL | Status: AC

## 2023-06-08 MED ORDER — ALFUZOSIN HCL ER 10 MG PO TB24
10.0000 mg | ORAL_TABLET | Freq: Every day | ORAL | Status: DC
  Administered 2023-06-10 – 2023-06-11 (×2): 10 mg via ORAL
  Filled 2023-06-08 (×4): qty 1

## 2023-06-08 MED ORDER — THROMBIN 20000 UNITS EX KIT
PACK | CUTANEOUS | Status: AC
  Filled 2023-06-08: qty 1

## 2023-06-08 MED ORDER — LABETALOL HCL 5 MG/ML IV SOLN
INTRAVENOUS | Status: DC | PRN
Start: 2023-06-08 — End: 2023-06-08
  Administered 2023-06-08: 10 mg via INTRAVENOUS

## 2023-06-08 MED ORDER — TOPIRAMATE 100 MG PO TABS
200.0000 mg | ORAL_TABLET | Freq: Every day | ORAL | Status: DC
  Administered 2023-06-09 – 2023-07-04 (×26): 200 mg via ORAL
  Filled 2023-06-08: qty 2
  Filled 2023-06-08: qty 8
  Filled 2023-06-08 (×6): qty 2
  Filled 2023-06-08: qty 8
  Filled 2023-06-08 (×2): qty 2
  Filled 2023-06-08 (×2): qty 8
  Filled 2023-06-08 (×4): qty 2
  Filled 2023-06-08 (×3): qty 8
  Filled 2023-06-08 (×2): qty 2
  Filled 2023-06-08: qty 8
  Filled 2023-06-08 (×3): qty 2
  Filled 2023-06-08: qty 8

## 2023-06-08 MED ORDER — DULOXETINE HCL 30 MG PO CPEP
30.0000 mg | ORAL_CAPSULE | Freq: Every day | ORAL | Status: DC
  Administered 2023-06-10 – 2023-07-05 (×26): 30 mg via ORAL
  Filled 2023-06-08 (×27): qty 1

## 2023-06-08 MED ORDER — INSULIN ASPART 100 UNIT/ML IJ SOLN
1.0000 [IU] | INTRAMUSCULAR | Status: DC
  Administered 2023-06-08: 2 [IU] via SUBCUTANEOUS
  Administered 2023-06-08: 1 [IU] via SUBCUTANEOUS
  Administered 2023-06-09: 2 [IU] via SUBCUTANEOUS
  Administered 2023-06-09 (×3): 1 [IU] via SUBCUTANEOUS
  Administered 2023-06-09: 2 [IU] via SUBCUTANEOUS
  Administered 2023-06-09: 1 [IU] via SUBCUTANEOUS
  Administered 2023-06-10 (×2): 2 [IU] via SUBCUTANEOUS
  Administered 2023-06-10 – 2023-06-12 (×7): 1 [IU] via SUBCUTANEOUS

## 2023-06-08 MED ORDER — ONDANSETRON HCL 4 MG PO TABS: 8.0000 mg | ORAL_TABLET | Freq: Two times a day (BID) | ORAL | Status: DC | PRN

## 2023-06-08 MED ORDER — MUPIROCIN 2 % EX OINT
TOPICAL_OINTMENT | Freq: Two times a day (BID) | CUTANEOUS | Status: DC
  Administered 2023-06-08 – 2023-06-27 (×8): 1 via NASAL
  Filled 2023-06-08 (×12): qty 22

## 2023-06-08 MED ORDER — FENTANYL CITRATE (PF) 250 MCG/5ML IJ SOLN
INTRAMUSCULAR | Status: DC | PRN
  Administered 2023-06-08 (×5): 50 ug via INTRAVENOUS

## 2023-06-08 MED ORDER — AMLODIPINE BESYLATE 5 MG PO TABS: 5.0000 mg | ORAL_TABLET | Freq: Every day | ORAL | Status: DC

## 2023-06-08 MED ORDER — CHLORHEXIDINE GLUCONATE 0.12 % MT SOLN
15.0000 mL | Freq: Once | OROMUCOSAL | Status: AC
  Administered 2023-06-08: 15 mL via OROMUCOSAL
  Filled 2023-06-08: qty 15

## 2023-06-08 MED ORDER — PROMETHAZINE HCL 12.5 MG PO TABS: 12.5000 mg | ORAL_TABLET | ORAL | Status: DC | PRN

## 2023-06-08 MED ORDER — PHENYLEPHRINE HCL-NACL 20-0.9 MG/250ML-% IV SOLN
INTRAVENOUS | Status: DC | PRN
  Administered 2023-06-08: 25 ug/min via INTRAVENOUS

## 2023-06-08 MED ORDER — BISACODYL 10 MG RE SUPP: 10.0000 mg | Freq: Every day | RECTAL | Status: DC | PRN

## 2023-06-08 MED ORDER — FLUTICASONE PROPIONATE 50 MCG/ACT NA SUSP
1.0000 | Freq: Every day | NASAL | Status: DC | PRN
  Administered 2023-06-27 – 2023-06-30 (×2): 1 via NASAL
  Filled 2023-06-08: qty 16

## 2023-06-08 MED ORDER — LABETALOL HCL 5 MG/ML IV SOLN
10.0000 mg | INTRAVENOUS | Status: AC | PRN
  Administered 2023-06-08 (×2): 10 mg via INTRAVENOUS

## 2023-06-08 MED ORDER — OXYCODONE HCL 5 MG PO TABS
5.0000 mg | ORAL_TABLET | ORAL | Status: DC | PRN
  Administered 2023-06-08 – 2023-06-24 (×16): 5 mg via ORAL
  Filled 2023-06-08 (×17): qty 1

## 2023-06-08 MED ORDER — POLYVINYL ALCOHOL 1.4 % OP SOLN: 1.0000 [drp] | OPHTHALMIC | Status: DC | PRN

## 2023-06-08 MED ORDER — BUPIVACAINE HCL (PF) 0.5 % IJ SOLN
INTRAMUSCULAR | Status: AC
  Filled 2023-06-08: qty 30

## 2023-06-08 MED ORDER — SODIUM CHLORIDE 0.9 % IV SOLN
INTRAVENOUS | Status: DC | PRN
  Administered 2023-06-08: 1000 mg via INTRAVENOUS

## 2023-06-08 MED ORDER — PHENYLEPHRINE 80 MCG/ML (10ML) SYRINGE FOR IV PUSH (FOR BLOOD PRESSURE SUPPORT)
PREFILLED_SYRINGE | INTRAVENOUS | Status: DC | PRN
  Administered 2023-06-08: 80 ug via INTRAVENOUS
  Administered 2023-06-08 (×3): 160 ug via INTRAVENOUS
  Administered 2023-06-08: 200 ug via INTRAVENOUS
  Administered 2023-06-08: 160 ug via INTRAVENOUS

## 2023-06-08 MED ORDER — TAMSULOSIN HCL 0.4 MG PO CAPS: 0.4000 mg | ORAL_CAPSULE | Freq: Two times a day (BID) | ORAL | Status: DC

## 2023-06-08 MED ORDER — INDOCYANINE GREEN 25 MG IV SOLR
INTRAVENOUS | Status: DC | PRN
Start: 2023-06-08 — End: 2023-06-08
  Administered 2023-06-08: 12.5 mg via INTRAVENOUS

## 2023-06-08 MED ORDER — PROPOFOL 10 MG/ML IV BOLUS
INTRAVENOUS | Status: AC
  Filled 2023-06-08: qty 20

## 2023-06-08 MED ORDER — FENTANYL CITRATE (PF) 100 MCG/2ML IJ SOLN: 25.0000 ug | INTRAMUSCULAR | Status: DC | PRN

## 2023-06-08 MED ORDER — HYDROMORPHONE HCL 1 MG/ML IJ SOLN
1.0000 mg | INTRAMUSCULAR | Status: AC | PRN
  Administered 2023-06-09 (×4): 1 mg via INTRAVENOUS
  Filled 2023-06-08 (×4): qty 1

## 2023-06-08 MED ORDER — THROMBIN 20000 UNITS EX SOLR
CUTANEOUS | Status: AC
  Filled 2023-06-08: qty 20000

## 2023-06-08 MED ORDER — TOPIRAMATE 100 MG PO TABS: 100.0000 mg | ORAL_TABLET | ORAL | Status: DC

## 2023-06-08 MED ORDER — CHLORHEXIDINE GLUCONATE CLOTH 2 % EX PADS
6.0000 | MEDICATED_PAD | Freq: Once | CUTANEOUS | Status: DC
  Administered 2023-06-08: 6 via TOPICAL

## 2023-06-08 MED ORDER — NICOTINE 14 MG/24HR TD PT24: 14.0000 mg | MEDICATED_PATCH | Freq: Every day | TRANSDERMAL | Status: DC | PRN

## 2023-06-08 MED ORDER — ONDANSETRON HCL 4 MG PO TABS
4.0000 mg | ORAL_TABLET | ORAL | Status: DC | PRN
  Administered 2023-06-24: 4 mg via ORAL
  Filled 2023-06-08: qty 1

## 2023-06-08 MED ORDER — NICARDIPINE HCL IN NACL 20-0.86 MG/200ML-% IV SOLN
3.0000 mg/h | INTRAVENOUS | Status: DC
  Administered 2023-06-08: 5 mg/h via INTRAVENOUS
  Administered 2023-06-08 – 2023-06-09 (×3): 7.5 mg/h via INTRAVENOUS
  Administered 2023-06-09: 5 mg/h via INTRAVENOUS
  Administered 2023-06-09 (×2): 7.5 mg/h via INTRAVENOUS
  Administered 2023-06-09 (×2): 9 mg/h via INTRAVENOUS
  Administered 2023-06-10 (×3): 5 mg/h via INTRAVENOUS
  Filled 2023-06-08 (×12): qty 200

## 2023-06-08 MED ORDER — PROPOFOL 10 MG/ML IV BOLUS
INTRAVENOUS | Status: DC | PRN
  Administered 2023-06-08: 150 mg via INTRAVENOUS

## 2023-06-08 MED ORDER — TOPIRAMATE 25 MG PO TABS: 200.0000 mg | ORAL_TABLET | Freq: Every day | ORAL | Status: DC

## 2023-06-08 MED ORDER — CEFAZOLIN SODIUM-DEXTROSE 2-4 GM/100ML-% IV SOLN
2.0000 g | Freq: Three times a day (TID) | INTRAVENOUS | Status: AC
  Administered 2023-06-08 – 2023-06-09 (×2): 2 g via INTRAVENOUS
  Filled 2023-06-08 (×2): qty 100

## 2023-06-08 MED ORDER — INDOCYANINE GREEN 25 MG IV SOLR
INTRAVENOUS | Status: AC
  Filled 2023-06-08: qty 10

## 2023-06-08 MED ORDER — 0.9 % SODIUM CHLORIDE (POUR BTL) OPTIME
TOPICAL | Status: DC | PRN
  Administered 2023-06-08: 3000 mL

## 2023-06-08 MED ORDER — TAMSULOSIN HCL 0.4 MG PO CAPS
0.4000 mg | ORAL_CAPSULE | Freq: Two times a day (BID) | ORAL | Status: DC
  Administered 2023-06-09 (×2): 0.4 mg via ORAL
  Filled 2023-06-08 (×2): qty 1

## 2023-06-08 MED ORDER — THROMBIN (RECOMBINANT) 20000 UNITS EX SOLR
CUTANEOUS | Status: AC
  Filled 2023-06-08: qty 20000

## 2023-06-08 MED ORDER — OXYCODONE HCL 5 MG/5ML PO SOLN: 5.0000 mg | Freq: Once | ORAL | Status: DC | PRN

## 2023-06-08 MED ORDER — GABAPENTIN 400 MG PO CAPS
800.0000 mg | ORAL_CAPSULE | Freq: Three times a day (TID) | ORAL | Status: DC
  Administered 2023-06-09 – 2023-07-05 (×77): 800 mg via ORAL
  Filled 2023-06-08 (×78): qty 2

## 2023-06-08 MED ORDER — CHLORHEXIDINE GLUCONATE CLOTH 2 % EX PADS
6.0000 | MEDICATED_PAD | Freq: Every day | CUTANEOUS | Status: DC
  Administered 2023-06-08 – 2023-06-17 (×11): 6 via TOPICAL

## 2023-06-08 MED ORDER — MIDAZOLAM HCL 2 MG/2ML IJ SOLN
INTRAMUSCULAR | Status: AC
  Filled 2023-06-08: qty 2

## 2023-06-08 MED ORDER — FAMOTIDINE 20 MG PO TABS: 20.0000 mg | ORAL_TABLET | Freq: Every day | ORAL | Status: DC

## 2023-06-08 MED ORDER — GABAPENTIN 400 MG PO CAPS: 800.0000 mg | ORAL_CAPSULE | Freq: Three times a day (TID) | ORAL | Status: DC

## 2023-06-08 MED ORDER — SENNOSIDES-DOCUSATE SODIUM 8.6-50 MG PO TABS
1.0000 | ORAL_TABLET | Freq: Every evening | ORAL | Status: DC | PRN
  Administered 2023-06-11: 1 via ORAL
  Filled 2023-06-08: qty 1

## 2023-06-08 MED ORDER — BACITRACIN ZINC 500 UNIT/GM EX OINT
TOPICAL_OINTMENT | CUTANEOUS | Status: AC
  Filled 2023-06-08: qty 28.35

## 2023-06-08 MED ORDER — FENTANYL CITRATE (PF) 250 MCG/5ML IJ SOLN
INTRAMUSCULAR | Status: AC
  Filled 2023-06-08: qty 5

## 2023-06-08 MED ORDER — LOSARTAN POTASSIUM 50 MG PO TABS
100.0000 mg | ORAL_TABLET | Freq: Every day | ORAL | Status: DC
  Administered 2023-06-09 – 2023-07-05 (×26): 100 mg via ORAL
  Filled 2023-06-08 (×28): qty 2

## 2023-06-08 MED ORDER — LACTATED RINGERS IV SOLN: INTRAVENOUS | Status: DC

## 2023-06-08 MED ORDER — THROMBIN 5000 UNITS EX SOLR
OROMUCOSAL | Status: DC | PRN
  Administered 2023-06-08: 5 mL via TOPICAL

## 2023-06-08 MED ORDER — ATORVASTATIN CALCIUM 40 MG PO TABS
40.0000 mg | ORAL_TABLET | Freq: Every day | ORAL | Status: DC
  Administered 2023-06-09 – 2023-07-05 (×27): 40 mg via ORAL
  Filled 2023-06-08 (×27): qty 1

## 2023-06-08 MED ORDER — POLYETHYL GLYCOL-PROPYL GLYCOL 0.4-0.3 % OP GEL: Freq: Every day | OPHTHALMIC | Status: DC | PRN

## 2023-06-08 MED ORDER — ROCURONIUM BROMIDE 10 MG/ML (PF) SYRINGE
PREFILLED_SYRINGE | INTRAVENOUS | Status: DC | PRN
  Administered 2023-06-08: 10 mg via INTRAVENOUS
  Administered 2023-06-08: 50 mg via INTRAVENOUS
  Administered 2023-06-08: 30 mg via INTRAVENOUS
  Administered 2023-06-08: 50 mg via INTRAVENOUS

## 2023-06-08 MED ORDER — MIDAZOLAM HCL 2 MG/2ML IJ SOLN
INTRAMUSCULAR | Status: DC | PRN
  Administered 2023-06-08: 2 mg via INTRAVENOUS

## 2023-06-08 MED ORDER — TOPIRAMATE 25 MG PO TABS: 100.0000 mg | ORAL_TABLET | Freq: Every day | ORAL | Status: DC

## 2023-06-08 MED ORDER — OXYCODONE HCL 5 MG PO TABS: 5.0000 mg | ORAL_TABLET | Freq: Once | ORAL | Status: DC | PRN

## 2023-06-08 MED ORDER — THROMBIN 20000 UNITS EX SOLR
CUTANEOUS | Status: DC | PRN
  Administered 2023-06-08: 20 mL via TOPICAL

## 2023-06-08 MED ORDER — MICROFIBRILLAR COLL HEMOSTAT EX PADS
MEDICATED_PAD | CUTANEOUS | Status: DC | PRN
  Administered 2023-06-08: 1 via TOPICAL

## 2023-06-08 MED ORDER — LIDOCAINE-EPINEPHRINE 1 %-1:100000 IJ SOLN
INTRAMUSCULAR | Status: AC
  Filled 2023-06-08: qty 1

## 2023-06-08 MED ORDER — CEFAZOLIN SODIUM-DEXTROSE 2-4 GM/100ML-% IV SOLN
2.0000 g | INTRAVENOUS | Status: AC
  Administered 2023-06-08: 2 g via INTRAVENOUS
  Filled 2023-06-08: qty 100

## 2023-06-08 MED ORDER — CHLORHEXIDINE GLUCONATE CLOTH 2 % EX PADS: 6.0000 | MEDICATED_PAD | Freq: Once | CUTANEOUS | Status: DC

## 2023-06-08 SURGICAL SUPPLY — 90 items
APL SKNCLS STERI-STRIP NONHPOA (GAUZE/BANDAGES/DRESSINGS)
BAG COUNTER SPONGE SURGICOUNT (BAG) ×1 IMPLANT
BAG SPNG CNTER NS LX DISP (BAG) ×1
BENZOIN TINCTURE PRP APPL 2/3 (GAUZE/BANDAGES/DRESSINGS) IMPLANT
BLADE SAW GIGLI 16 STRL (MISCELLANEOUS) IMPLANT
BLADE SURG 15 STRL LF DISP TIS (BLADE) ×1 IMPLANT
BLADE SURG 15 STRL SS (BLADE) ×1
BNDG CMPR 75X41 PLY ABS (GAUZE/BANDAGES/DRESSINGS) ×1
BNDG CMPR 75X41 PLY HI ABS (GAUZE/BANDAGES/DRESSINGS)
BNDG GAUZE DERMACEA FLUFF 4 (GAUZE/BANDAGES/DRESSINGS) IMPLANT
BNDG GZE DERMACEA 4 6PLY (GAUZE/BANDAGES/DRESSINGS) ×1
BNDG STRETCH 4X75 NS LF (GAUZE/BANDAGES/DRESSINGS) IMPLANT
BNDG STRETCH 4X75 STRL LF (GAUZE/BANDAGES/DRESSINGS) IMPLANT
BUR MATCHSTICK NEURO 3.0 LAGG (BURR) IMPLANT
BUR ROUND FLUTED 5 RND (BURR) IMPLANT
BUR SPIRAL ROUTER 2.3 (BUR) IMPLANT
CANISTER SUCT 3000ML PPV (MISCELLANEOUS) ×1 IMPLANT
CLIP ANEURY TIP ERM 5 STD 7.5 (INSTRUMENTS) IMPLANT
CLIP TI MEDIUM 6 (CLIP) IMPLANT
COVER MAYO STAND STRL (DRAPES) IMPLANT
DRAPE MICROSCOPE SLANT 54X150 (MISCELLANEOUS) ×1 IMPLANT
DRAPE NEUROLOGICAL W/INCISE (DRAPES) ×1 IMPLANT
DRAPE POUCH INSTRU U-SHP 10X18 (DRAPES) IMPLANT
DRAPE WARM FLUID 44X44 (DRAPES) ×1 IMPLANT
DRSG ADAPTIC 3X8 NADH LF (GAUZE/BANDAGES/DRESSINGS) ×1 IMPLANT
DRSG TELFA 3X8 NADH STRL (GAUZE/BANDAGES/DRESSINGS) ×1 IMPLANT
DURAPREP 6ML APPLICATOR 50/CS (WOUND CARE) ×1 IMPLANT
ELECT REM PT RETURN 9FT ADLT (ELECTROSURGICAL) ×1
ELECTRODE REM PT RTRN 9FT ADLT (ELECTROSURGICAL) ×1 IMPLANT
EVACUATOR SILICONE 100CC (DRAIN) IMPLANT
FORCEPS BIPOLAR SPETZLER 8 1.0 (NEUROSURGERY SUPPLIES) IMPLANT
GAUZE 4X4 16PLY ~~LOC~~+RFID DBL (SPONGE) IMPLANT
GAUZE SPONGE 4X4 12PLY STRL (GAUZE/BANDAGES/DRESSINGS) IMPLANT
GLOVE BIO SURGEON STRL SZ7.5 (GLOVE) IMPLANT
GLOVE BIOGEL PI IND STRL 7.0 (GLOVE) IMPLANT
GLOVE BIOGEL PI IND STRL 7.5 (GLOVE) ×1 IMPLANT
GLOVE ECLIPSE 7.0 STRL STRAW (GLOVE) ×2 IMPLANT
GOWN STRL REUS W/ TWL LRG LVL3 (GOWN DISPOSABLE) ×2 IMPLANT
GOWN STRL REUS W/ TWL XL LVL3 (GOWN DISPOSABLE) IMPLANT
GOWN STRL REUS W/TWL 2XL LVL3 (GOWN DISPOSABLE) IMPLANT
GOWN STRL REUS W/TWL LRG LVL3 (GOWN DISPOSABLE) ×2
GOWN STRL REUS W/TWL XL LVL3 (GOWN DISPOSABLE) ×1
HEMOSTAT POWDER KIT SURGIFOAM (HEMOSTASIS) ×1 IMPLANT
HEMOSTAT SURGICEL 2X14 (HEMOSTASIS) ×1 IMPLANT
HOOK DURA (MISCELLANEOUS) ×1 IMPLANT
HOOK DURA 1/2IN (MISCELLANEOUS) ×1 IMPLANT
KIT BASIN OR (CUSTOM PROCEDURE TRAY) ×1 IMPLANT
KIT DRAIN CSF ACCUDRAIN (MISCELLANEOUS) IMPLANT
KIT TURNOVER KIT B (KITS) ×1 IMPLANT
KNIFE ARACHNOID DISP AM-24-S (MISCELLANEOUS) IMPLANT
NDL HYPO 22X1.5 SAFETY MO (MISCELLANEOUS) IMPLANT
NDL HYPO 25X1 1.5 SAFETY (NEEDLE) ×1 IMPLANT
NDL SPNL 25GX3.5 QUINCKE BL (NEEDLE) IMPLANT
NEEDLE HYPO 22X1.5 SAFETY MO (MISCELLANEOUS) IMPLANT
NEEDLE HYPO 25X1 1.5 SAFETY (NEEDLE) ×1 IMPLANT
NEEDLE SPNL 25GX3.5 QUINCKE BL (NEEDLE) IMPLANT
NS IRRIG 1000ML POUR BTL (IV SOLUTION) ×1 IMPLANT
PACK BATTERY CMF DISP FOR DVR (ORTHOPEDIC DISPOSABLE SUPPLIES) IMPLANT
PACK CRANIOTOMY CUSTOM (CUSTOM PROCEDURE TRAY) ×1 IMPLANT
PAD ARMBOARD 7.5X6 YLW CONV (MISCELLANEOUS) ×1 IMPLANT
PATTIES SURGICAL .25X.25 (GAUZE/BANDAGES/DRESSINGS) IMPLANT
PATTIES SURGICAL .5 X.5 (GAUZE/BANDAGES/DRESSINGS) IMPLANT
PATTIES SURGICAL .5 X3 (DISPOSABLE) IMPLANT
PATTIES SURGICAL 1/4 X 3 (GAUZE/BANDAGES/DRESSINGS) IMPLANT
PATTIES SURGICAL 1X1 (DISPOSABLE) IMPLANT
PERFORATOR LRG 14-11MM (BIT) IMPLANT
PIN MAYFIELD SKULL DISP (PIN) IMPLANT
PLATE CRANIAL 4H UNI NEURO III (Plate) IMPLANT
PLATE UNIV CMF 16 2H (Plate) IMPLANT
SCREW UNIII AXS SD 1.5X4 (Screw) IMPLANT
SPIKE FLUID TRANSFER (MISCELLANEOUS) ×1 IMPLANT
SPONGE NEURO XRAY DETECT 1X3 (DISPOSABLE) IMPLANT
SPONGE SURGIFOAM ABS GEL 100 (HEMOSTASIS) ×1 IMPLANT
STAPLER VISISTAT 35W (STAPLE) ×1 IMPLANT
STOCKINETTE 6 STRL (DRAPES) ×1 IMPLANT
SUT ETHILON 3 0 FSL (SUTURE) IMPLANT
SUT NURALON 4 0 TR CR/8 (SUTURE) ×2 IMPLANT
SUT VIC AB 0 CT1 18XCR BRD8 (SUTURE) ×2 IMPLANT
SUT VIC AB 0 CT1 8-18 (SUTURE) ×1
SUT VIC AB 3-0 SH 8-18 (SUTURE) ×1 IMPLANT
TAPE CLOTH 1X10 TAN NS (GAUZE/BANDAGES/DRESSINGS) ×1 IMPLANT
TIP NONSTICK .5X23 (INSTRUMENTS) IMPLANT
TOWEL GREEN STERILE (TOWEL DISPOSABLE) ×1 IMPLANT
TOWEL GREEN STERILE FF (TOWEL DISPOSABLE) ×1 IMPLANT
TRAY CATH INTERMITTENT SS 16FR (CATHETERS) IMPLANT
TRAY FOLEY MTR SLVR 16FR STAT (SET/KITS/TRAYS/PACK) IMPLANT
TUBING FEATHERFLOW (TUBING) IMPLANT
UNDERPAD 30X36 HEAVY ABSORB (UNDERPADS AND DIAPERS) IMPLANT
WATER STERILE IRR 1000ML POUR (IV SOLUTION) ×1 IMPLANT
clip aneury ti perm 5.0 std7.5mmrtang IMPLANT
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
## 2023-06-08 NOTE — Transfer of Care (Signed)
Immediate Anesthesia Transfer of Care Note  Patient: Brian [name]  Procedure(s) Performed: CRANI FOR CLIPPING OF ANTERIOR COMMUNICATING ARTERY ANEURYSM (Left)  Patient Location: PACU  Anesthesia Type:General  Level of Consciousness: sedated and drowsy  Airway & Oxygen Therapy: Patient Spontanous Breathing and Patient connected to nasal cannula oxygen  Post-op Assessment: Report given to RN and Post -op Vital signs reviewed and stable  Post vital signs: Reviewed and stable  Last Vitals:  Vitals Value Taken Time  BP 160/76 06/08/23 1600  Temp    Pulse 70 06/08/23 1605  Resp 18 06/08/23 1605  SpO2 94 % 06/08/23 1605  Vitals shown include unfiled device data.  Last Pain:  Vitals:   06/08/23 0826  TempSrc:   PainSc: 8          Complications: No notable events documented.
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
## 2023-06-08 NOTE — Progress Notes (Addendum)
When reviewing his suicidal risk assessment, patient stated that he had thoughts of killing himself. He stated that he was thinking about ways that he might do this, but did not prepare to end his life, or have any intent to end his life. He said he had thoughts like this twice this past week. Pt reports he used to seep psychiatry, but stopped seeing them due to it being far away and appointments being cancelled. pt states he will talk to his pastor about these thoughts, but he does not want to talk to social work about these thoughts today. I have notified his surgeon and anesthesiologist about this. He is not currently having any suicidal ideation or thoughts of killing himself. Dr Chaney Malling and Dr Conchita Paris notified.   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   I spoke with Raymondo Band, LCSW regarding patient. Patient informed that we could place consult to transition of care to help set up resources for him. Pt did not agree to this. Dr Conchita Paris notified, and informed that he could place Psych consult after admission if he has immediate concern for patient, or order for Transition of Care consult could be placed to help patient get set up with outpatient therapy.   Pt states that he would like his visitor Jillyn Hidden to come sit with him, as this would help his anxiety today. Volunteer desk notified to bring patient's visitor to room.

## 2023-06-08 NOTE — Progress Notes (Signed)
eLink Physician-Brief Progress Note Patient Name: Brian [name] DOB: 1956-01-22 MRN: [id number]   Date of Service  06/08/2023  HPI/Events of Note  67 year old male in the ICU for postoperative management of left frontotemporal craniotomy for clipping of ACA aneurysm.  Had postoperative pain and nausea treated with oxycodone and Zofran.  eICU Interventions  Switch p.o. Phenergan to IV Phenergan.  Add Dilaudid for breakthrough     Intervention Category Intermediate Interventions: Pain - evaluation and management  Sabin Gibeault 06/08/2023, 11:36 PM

## 2023-06-08 NOTE — Anesthesia Procedure Notes (Signed)
Procedure Name: Intubation Date/Time: 06/08/2023 11:49 AM  Performed by: Sandie Ano, CRNAPre-anesthesia Checklist: Patient identified, Emergency Drugs available, Suction available and Patient being monitored Patient Re-evaluated:Patient Re-evaluated prior to induction Oxygen Delivery Method: Circle System Utilized Preoxygenation: Pre-oxygenation with 100% oxygen Induction Type: IV induction Ventilation: Mask ventilation without difficulty Laryngoscope Size: Mac and 3 Grade View: Grade I Tube type: Oral Tube size: 7.0 mm Number of attempts: 1 Airway Equipment and Method: Stylet and Oral airway Placement Confirmation: ETT inserted through vocal cords under direct vision, positive ETCO2 and breath sounds checked- equal and bilateral Secured at: 23 cm Tube secured with: Tape Dental Injury: Teeth and Oropharynx as per pre-operative assessment

## 2023-06-08 NOTE — Anesthesia Preprocedure Evaluation (Signed)
Anesthesia Evaluation  Patient identified by MRN, date of birth, ID band Patient awake    Reviewed: Allergy & Precautions, H&P , NPO status , Patient's Chart, lab work & pertinent test results  Airway Mallampati: II   Neck ROM: full    Dental   Pulmonary sleep apnea , Current Smoker and Patient abstained from smoking.   breath sounds clear to auscultation       Cardiovascular hypertension,  Rhythm:regular Rate:Normal     Neuro/Psych  Headaches, Seizures -,  PSYCHIATRIC DISORDERS Anxiety Depression    CVA    GI/Hepatic ,GERD  ,,  Endo/Other  diabetes, Type 2    Renal/GU      Musculoskeletal  (+) Arthritis ,    Abdominal   Peds  Hematology   Anesthesia Other Findings   Reproductive/Obstetrics                             Anesthesia Physical Anesthesia Plan  ASA: 3  Anesthesia Plan: General   Post-op Pain Management:    Induction: Intravenous  PONV Risk Score and Plan: 1 and Ondansetron, Dexamethasone, Midazolam and Treatment may vary due to age or medical condition  Airway Management Planned: Oral ETT  Additional Equipment: Arterial line  Intra-op Plan:   Post-operative Plan: Extubation in OR  Informed Consent: I have reviewed the patients History and Physical, chart, labs and discussed the procedure including the risks, benefits and alternatives for the proposed anesthesia with the patient or authorized representative who has indicated his/her understanding and acceptance.     Dental advisory given  Plan Discussed with: CRNA, Anesthesiologist and Surgeon  Anesthesia Plan Comments:        Anesthesia Quick Evaluation

## 2023-06-08 NOTE — Op Note (Signed)
NEUROSURGERY OPERATIVE NOTE   PREOP DIAGNOSIS:  Anterior communicating artery aneurysm   POSTOP DIAGNOSIS: Same  PROCEDURE: Left frontotemporal craniotomy for clipping of anterior communicating artery aneurysm, simple Use of intraoperative ICG videoangiography Use of intraoperative microscope  SURGEON: Dr. Lisbeth Renshaw, MD  ASSISTANT: Dr. Autumn Patty, MD  ANESTHESIA: General Endotracheal  EBL: 100cc  SPECIMENS: None  DRAINS: None  COMPLICATIONS: None immediate  CONDITION: Hemodynamically stable to PACU  HISTORY: Brian [name] is a 67 y.o. male with previously discovered anterior communicating artery aneurysm confirmed on diagnostic cerebral angiogram.  Lengthy discussion of treatment options was had with the patient in the office.  Ultimately, I recommended surgical clip ligation.  The risks, benefits, and alternatives to surgery were all reviewed in detail with the patient.  After all his questions were answered informed consent was obtained and witnessed.  PROCEDURE IN DETAIL: The patient was brought to the operating room. After induction of general anesthesia, the patient was positioned on the operative table in the [place] head holder in the supine position. All pressure points were meticulously padded. Skin incision was then marked out and prepped and draped in the usual sterile fashion.  After timeout was conducted, standard curvilinear left sided frontotemporal skin incision was infiltrated with local anesthetic without epinephrine.  Incision was then made sharply and carried down through the galea.  Raney clips were applied.  Bovie electrocautery was used to dissect in the subperiosteal plane and incised the temporalis muscle and fascia.  A single piece myocutaneous flap was elevated and reflected anteriorly.  High-speed drill was used to create multiple bur holes and a standard frontotemporal craniotomy flap was fashioned and elevated.  Hemostasis on the  epidural plane was secured with bipolar electrocautery.  High-speed drill was then used to drill down the lesser wing of the sphenoid and flatten the orbit.  The dura was then opened in curvilinear fashion and tacked up anteriorly with 4-0 Nurolon stitches.  At this point the microscope was draped sterilely and brought into the field and the remainder of the case was done under the microscope using microdissection technique.  Initially, a subfrontal approach was deployed and identified the optic nerve.  I then dissected laterally to open up the optical carotid cistern.  There was good egress of CSF which allowed good brain relaxation.  I then identified the supraclinoid carotid.  I opened up the medial sylvian fissure and traced the carotid more distally.  I then dissected on top of the optic nerve and identified the A1 segment.  A location for temporary clipping was identified should the need arise.  I then further dissected in the inter optic space and identify the optic chiasm as well as the lamina terminalis.  I was then able to traced the A1 into the interhemispheric fissure.  I identified the ipsilateral A2, anterior communicating complex, and the contralateral A1 and A2 segments.  I further dissected the interhemispheric fissure and was able to visualize the aneurysm neck projecting in line with the directional flow of the left A1.  At this point I dissected underneath the ACOM complex and identified the contralateral artery of Heubner.  At this point I selected a right ankle fenestrated clip.  This was placed across the aneurysm neck by fenestrating the right half of the ACOM.  I then inspected the clipping and the A1 and A2 segments appeared to be patent bilaterally.  The anesthesia service administered a bolus dose of 12.5 mg ICG.  Video angiography was performed  which confirmed patency of bilateral A1 and A2 segments.  I also confirmed patency of the identified recurrent artery of Heubner.  At  this point the wound was irrigated with normal saline.  Hemostasis was secured using morselized Gelfoam with thrombin and bipolar electrocautery.  The dura was reapproximated with interrupted 4-0 Nurolon stitches.  Gelfoam was then placed over the dura after hemostasis was secured.  The bone flap was then replaced and plated with standard titanium plates and screws.  The wound was again irrigated.  Temporalis muscle and fascia were reapproximated with interrupted 0 Vicryl stitches and the galea was reapproximated with interrupted 0 and 3-0 Vicryl stitches.  Skin was closed with staples.  Bacitracin ointment and sterile dressings were applied.  Patient was then removed from the [place] head holder and a head wrap was placed.  At the end of the case all sponge, needle, instrument, and cottonoid counts were correct.  Patient was extubated and taken to the postanesthesia care unit in stable hemodynamic condition.   Lisbeth Renshaw, MD [facility]

## 2023-06-09 ENCOUNTER — Inpatient Hospital Stay (HOSPITAL_COMMUNITY): Payer: Medicare HMO

## 2023-06-09 DIAGNOSIS — I671 Cerebral aneurysm, nonruptured: Secondary | ICD-10-CM

## 2023-06-09 LAB — GLUCOSE, CAPILLARY
Glucose-Capillary: 131 mg/dL — ABNORMAL HIGH (ref 70–99)
Glucose-Capillary: 177 mg/dL — ABNORMAL HIGH (ref 70–99)
Glucose-Capillary: 125 mg/dL — ABNORMAL HIGH (ref 70–99)
Glucose-Capillary: 132 mg/dL — ABNORMAL HIGH (ref 70–99)
Glucose-Capillary: 121 mg/dL — ABNORMAL HIGH (ref 70–99)
Glucose-Capillary: 121 mg/dL — ABNORMAL HIGH (ref 70–99)

## 2023-06-09 LAB — BASIC METABOLIC PANEL
Anion gap: 10 (ref 5–15)
BUN: 20 mg/dL (ref 8–23)
CO2: 17 mmol/L — ABNORMAL LOW (ref 22–32)
Calcium: 8.2 mg/dL — ABNORMAL LOW (ref 8.9–10.3)
Chloride: 115 mmol/L — ABNORMAL HIGH (ref 98–111)
Creatinine, Ser: 1.17 mg/dL (ref 0.61–1.24)
GFR, Estimated: >60 mL/min (ref >60)
Glucose, Bld: 125 mg/dL — ABNORMAL HIGH (ref 70–99)
Potassium: 3.4 mmol/L — ABNORMAL LOW (ref 3.5–5.1)
Sodium: 142 mmol/L (ref 135–145)

## 2023-06-09 LAB — CBC
HCT: 35.1 % — ABNORMAL LOW (ref 39.0–52.0)
Hemoglobin: 11.5 g/dL — ABNORMAL LOW (ref 13.0–17.0)
MCH: 32 pg (ref 26.0–34.0)
MCHC: 32.8 g/dL (ref 30.0–36.0)
MCV: 97.8 fL (ref 80.0–100.0)
Platelets: 175 10*3/uL (ref 150–400)
RBC: 3.59 MIL/uL — ABNORMAL LOW (ref 4.22–5.81)
RDW: 13.4 % (ref 11.5–15.5)
WBC: 17.4 10*3/uL — ABNORMAL HIGH (ref 4.0–10.5)
nRBC: 0 % (ref 0.0–0.2)

## 2023-06-09 LAB — MAGNESIUM: Magnesium: 2.1 mg/dL (ref 1.7–2.4)

## 2023-06-09 LAB — PHOSPHORUS: Phosphorus: 3.3 mg/dL (ref 2.5–4.6)

## 2023-06-09 MED ORDER — POTASSIUM CHLORIDE 20 MEQ PO PACK
40.0000 meq | PACK | Freq: Once | ORAL | Status: AC
  Administered 2023-06-09: 40 meq via ORAL
  Filled 2023-06-09: qty 2

## 2023-06-09 MED ORDER — NICOTINE 21 MG/24HR TD PT24
21.0000 mg | MEDICATED_PATCH | Freq: Every day | TRANSDERMAL | Status: DC | PRN
  Administered 2023-06-14 – 2023-06-17 (×3): 21 mg via TRANSDERMAL
  Filled 2023-06-09 (×3): qty 1

## 2023-06-09 MED ORDER — LEVETIRACETAM IN NACL 500 MG/100ML IV SOLN
500.0000 mg | Freq: Two times a day (BID) | INTRAVENOUS | Status: DC
  Administered 2023-06-09 – 2023-06-10 (×3): 500 mg via INTRAVENOUS
  Filled 2023-06-09 (×3): qty 100

## 2023-06-09 MED ORDER — HYDRALAZINE HCL 20 MG/ML IJ SOLN
10.0000 mg | Freq: Four times a day (QID) | INTRAMUSCULAR | Status: DC | PRN
  Administered 2023-06-09: 20 mg via INTRAVENOUS
  Filled 2023-06-09: qty 1

## 2023-06-09 MED ORDER — LEVETIRACETAM 500 MG PO TABS: 500.0000 mg | ORAL_TABLET | Freq: Two times a day (BID) | ORAL | Status: DC

## 2023-06-09 MED ORDER — HYDRALAZINE HCL 20 MG/ML IJ SOLN
10.0000 mg | INTRAMUSCULAR | Status: DC | PRN
  Administered 2023-06-09 – 2023-06-10 (×4): 20 mg via INTRAVENOUS
  Filled 2023-06-09 (×4): qty 1

## 2023-06-09 MED ORDER — AMLODIPINE BESYLATE 10 MG PO TABS
10.0000 mg | ORAL_TABLET | Freq: Every day | ORAL | Status: DC
  Administered 2023-06-09 – 2023-06-11 (×3): 10 mg via ORAL
  Filled 2023-06-09 (×3): qty 1

## 2023-06-09 MED ORDER — IPRATROPIUM-ALBUTEROL 0.5-2.5 (3) MG/3ML IN SOLN: 3.0000 mL | Freq: Four times a day (QID) | RESPIRATORY_TRACT | Status: DC | PRN

## 2023-06-09 NOTE — Progress Notes (Signed)
Noncontrast CT scan was personally reviewed. This demonstrates small amount of pneumocephalus surrounding the frontopolar region bilaterally. There's no evidence of ACA or caudate stroke. No hydrocephalus. Overall, the scan is quite reassuring. We will continue the current plan of care.

## 2023-06-09 NOTE — Anesthesia Postprocedure Evaluation (Signed)
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  Last Vitals:  Vitals:   06/09/23 1900 06/09/23 1915  BP: (!) 111/54   Pulse: 60 63  Resp: 10 18  Temp:    SpO2: 96% 96%    Last Pain:  Vitals:   06/09/23 1734  TempSrc:   PainSc: Asleep                 Oris Staffieri S

## 2023-06-09 NOTE — Progress Notes (Signed)
  NEUROSURGERY PROGRESS NOTE   no issues overnight, patient noted to be quite drowsy.  EXAM:  BP (!) 134/49   Pulse 89   Temp (!) 100.8 F (38.2 C) (Axillary)   Resp 15   Ht 6' (1.829 m)   Wt 90.7 kg   SpO2 95%   BMI 27.12 kg/m   drowsy but arouses to voice. oriented to-person not year cranial nerves grossly intact  moving all extremities well  wound clean and dry  IMPRESSION:  67 y.o. male  post-op day number one left craniotomy for clipping of anterior communicating artery aneurysm. exam is non-focal but patient remains quite drowsy.  PLAN: - will obtain CT scan this morning  - Increase norvasc, titrate cardene off - Cont keppra in addition to home topamax   Lisbeth Renshaw, MD [facility]

## 2023-06-09 NOTE — Progress Notes (Signed)
NAME:  Brian [name], MRN:  [id number], DOB:  12-22-1955, LOS: 1 ADMISSION DATE:  06/08/2023, CONSULTATION DATE:  06/08/2023 REFERRING MD: Conchita Paris - NSGY CHIEF COMPLAINT: S/p L frontotemporal craniotomy, Acomm clipping    History of Present Illness:  67 year old man who presented to [facility] 10/8 for planned management of cerebral aneurysm (detected incidentally on MRI Brain during seizure workup. PMHx significant for HTN, OSA (not on CPAP), tobacco abuse, T2DM (diet-controlled), GERD, anxiety/depression, seizure disorder (iniital seizure age 22, another ~1 month PTA), and CKD stage 3b. Patient underwent scheduled left frontotemporal craniotomy with anterior communicating artery aneurysm clipping 10/8 with NSGY (Dr. Conchita Paris).  PCCM consulted for postoperative management.  Pertinent Medical History:  HTN, OSA (not on CPAP), tobacco abuse, T2DM (diet-controlled), GERD, anxiety/depression, seizure disorder (iniital seizure age 72, another ~1 month PTA), and CKD stage 3b  Significant Hospital Events: Including procedures, antibiotic start and stop dates in addition to other pertinent events   10/8 - Underwent scheduled L frontotemporal craniotomy  with Acomm artery aneurysm clipping  Interim History / Subjective:  No significant overnight events Intermittent agitation/impulsivity, at times pulling at lines/tubes and wanting to get OOB Mitts/fall mats/bed alarm in place SBP remains elevated with escalating Cardene gtt doses Intermittently somnolent but will wake with stimulation  Objective:  Blood pressure (!) 134/49, pulse 88, temperature 99.8 F (37.7 C), temperature source Axillary, resp. rate (!) 22, height 6' (1.829 m), weight 90.7 kg, SpO2 91%.        Intake/Output Summary (Last 24 hours) at 06/09/2023 0725 Last data filed at 06/09/2023 0600 Gross per 24 hour  Intake 3935.04 ml  Output 800 ml  Net 3135.04 ml   Filed Weights   06/08/23 0804  Weight: 90.7 kg   Physical  Examination: General: Acutely ill-appearing older man in NAD. Somnolent. HEENT: Normocephalic, dressing s/p crani/aneurysm clipping in place. Anicteric sclera, PERRL 2.61mm, dry mucous membranes. Neuro: Lethargic. Will wake to voice/with stimulation. Responds to verbal stimuli. Following commands intermittently. Moves all 4 extremities spontaneously. No focal neurologic deficits. +Cough  CV: RRR, no m/g/r. PULM: Breathing even and unlabored on 2LNC. Lung fields CTAB. GI: Soft, nontender, nondistended. Normoactive bowel sounds. Extremities: No LE edema noted. Skin: Warm/dry, no rashes.  Resolved Hospital Problem List:    Assessment & Plan:  Left frontotemporal craniotomy of anterior communicating artery aneurysm clipping, 10/8 History of seizures - POD#1 from Acomm clipping - Postoperative management per NSGY (primary team) - Goal SBP 120-160 - Cardene gtt titrated to goal SBP - Multimodal pain management - AEDs: Keppra, topiramate - Antiemetics: Phenergan - Seizure precautions - Neuroprotective measures: HOB > 30 degrees, normoglycemia, normothermia, electrolytes WNL  OSA (not using CPAP) Tobacco abuse - Supplemental O2 support as needed - Wean O2 for sat > 90% - CPAP at bedtime (if patient will tolerate) - DuoNeb PRN - Pulmonary hygiene - Nicotine patch  HTN HLD - SBP management as above (goal 120-160) - Cardene gtt - Continue home amlodipine, losartan - Continue ASA/statin  T2DM - SSI - CBGs Q4H - Goal CBG 140-180  CKD stage 3b - Trend BMP - Replete electrolytes as indicated - Monitor I&Os - Avoid nephrotoxic agents as able - Ensure adequate renal perfusion  GERD - PPI  Anxiety/depression - Continue duloxetine  Best Practice: (right click and "Reselect all SmartList Selections" daily)   Diet/type: clear liquids DVT prophylaxis: SCD GI prophylaxis: PPI Lines: N/A and Arterial Line Foley:  N/A Code Status:  full code Last date of multidisciplinary  goals of  care discussion []   Critical care time:   The patient is critically ill with multiple organ system failure and requires high complexity decision making for assessment and support, frequent evaluation and titration of therapies, advanced monitoring, review of radiographic studies and interpretation of complex data.   Critical Care Time devoted to patient care services, exclusive of separately billable procedures, described in this note is 34 minutes.  Tim Lair, PA-C [facility] 06/09/23 10:09 AM  Please see [email or web address] for pager details.  From 7A-7P if no response, please call [phone] After hours, please call ELink [phone]

## 2023-06-10 ENCOUNTER — Other Ambulatory Visit: Payer: Self-pay

## 2023-06-10 DIAGNOSIS — I671 Cerebral aneurysm, nonruptured: Secondary | ICD-10-CM | POA: Diagnosis not present

## 2023-06-10 LAB — GLUCOSE, CAPILLARY
Glucose-Capillary: 152 mg/dL — ABNORMAL HIGH (ref 70–99)
Glucose-Capillary: 136 mg/dL — ABNORMAL HIGH (ref 70–99)
Glucose-Capillary: 153 mg/dL — ABNORMAL HIGH (ref 70–99)
Glucose-Capillary: 140 mg/dL — ABNORMAL HIGH (ref 70–99)
Glucose-Capillary: 97 mg/dL (ref 70–99)
Glucose-Capillary: 105 mg/dL — ABNORMAL HIGH (ref 70–99)

## 2023-06-10 MED ORDER — HYDRALAZINE HCL 50 MG PO TABS
50.0000 mg | ORAL_TABLET | Freq: Three times a day (TID) | ORAL | Status: DC
  Administered 2023-06-10 – 2023-07-03 (×65): 50 mg via ORAL
  Filled 2023-06-10 (×65): qty 1

## 2023-06-10 NOTE — Progress Notes (Signed)
Ice pack applied to pt's face d/t swelling following pt rubbing it.

## 2023-06-10 NOTE — Progress Notes (Signed)
  NEUROSURGERY PROGRESS NOTE   No issues overnight  EXAM:  BP 126/60   Pulse 72   Temp 98.2 F (36.8 C) (Axillary)   Resp 16   Ht 6' (1.829 m)   Wt 90.7 kg   SpO2 94%   BMI 27.12 kg/m   Arouses easily CN grossly intact, left eye swelling improved Speech fluent MAE good strengh wound c/d/i  IMPRESSION:  66 y.o. male  POD#2 elective left crani for Acom clipping, exam non-focal, drowsiness improving. CT yesterday reassuring.  PLAN: - Begin to mobilize with PT/OT - Cont to wean nicardipene - d/c A-line - Keppra as additional prophylactic AED after crani, can finish 7d course.  - Plan on d/c restraints, can get a sitter if available   Lisbeth Renshaw, MD [facility]

## 2023-06-10 NOTE — Progress Notes (Addendum)
eLink Physician-Brief Progress Note Patient Name: Brian [name] DOB: 03-29-1956 MRN: [id number]   Date of Service  06/10/2023  HPI/Events of Note  67 year old male that presented with a left frontotemporal craniotomy With clipping of anterior communicating artery aneurysm.  Ongoing encephalopathy.  Previously requiring a sitter, unfortunately the sitter had to leave.  eICU Interventions  In addition to wrist restraints, initiate Posey belt   0052 -episodes of SVT to 140-170 when the patient is agitated.  Calms down when agitation resolves.  No immediate intervention required.  0209 -persistent agitation.  QTc 420.  Add Haldol as needed.  0355 - Haldol with limited efficacy. D/C, add precedex  0408 -  Afib RVR,preserved EF. Start Dilt Gtt  Intervention Category Minor Interventions: Agitation / anxiety - evaluation and management  Jaleisa Brose 06/10/2023, 11:43 PM

## 2023-06-10 NOTE — Progress Notes (Signed)
NAME:  Brian [name], MRN:  [id number], DOB:  05-Nov-1955, LOS: 2 ADMISSION DATE:  06/08/2023, CONSULTATION DATE:  06/08/2023 REFERRING MD: Conchita Paris - NSGY CHIEF COMPLAINT: S/p L frontotemporal craniotomy, Acomm clipping    History of Present Illness:  67 year old man who presented to [facility] 10/8 for planned management of cerebral aneurysm (detected incidentally on MRI Brain during seizure workup. PMHx significant for HTN, OSA (not on CPAP), tobacco abuse, T2DM (diet-controlled), GERD, anxiety/depression, seizure disorder (iniital seizure age 58, another ~1 month PTA), and CKD stage 3b. Patient underwent scheduled left frontotemporal craniotomy with anterior communicating artery aneurysm clipping 10/8 with NSGY (Dr. Conchita Paris).  PCCM consulted for postoperative management.  Pertinent Medical History:  HTN, OSA (not on CPAP), tobacco abuse, T2DM (diet-controlled), GERD, anxiety/depression, seizure disorder (iniital seizure age 1, another ~1 month PTA), and CKD stage 3b  Significant Hospital Events: Including procedures, antibiotic start and stop dates in addition to other pertinent events   10/8 - Underwent scheduled L frontotemporal craniotomy  with Acomm artery aneurysm clipping 10/9 - CPAP PRN, mental status improved. Remains on Cardene gtt.  Interim History / Subjective:  No significant events overnight Mental status improving, less somnolent Orientation improving Cardene down to 5mg /hr Hydralazine PO added, can uptitrate ?PT/OT consults pending NSGY approval  Objective:  Blood pressure 126/60, pulse 72, temperature 98.2 F (36.8 C), temperature source Axillary, resp. rate 16, height 6' (1.829 m), weight 90.7 kg, SpO2 94%.        Intake/Output Summary (Last 24 hours) at 06/10/2023 0727 Last data filed at 06/10/2023 0700 Gross per 24 hour  Intake 1272.96 ml  Output 1200 ml  Net 72.96 ml   Filed Weights   06/08/23 0804  Weight: 90.7 kg   Physical Examination: General:  Acutely ill-appearing middle aged man in NAD. HEENT: Normocephalic, L-sided incision with staple closure. Anicteric sclera, PERRL 3mm, moist mucous membranes. L periorbital edema and +ptosis, eyebrow droop. Neuro:  Drowsy, wakes easily to voice.  Responds to verbal stimuli. Following commands consistently. Moves all 4 extremities spontaneously. CV: RRR, III/VI systolic murmur noted at LLSB. PULM: Breathing even and unlabored on 4LNC. Sonorous respirations while sleeping. Lung fields CTAB. GI: Soft, nontender, nondistended. Normoactive bowel sounds. Extremities: No LE edema noted. Skin: Warm/dry, no rashes.  Resolved Hospital Problem List:    Assessment & Plan:  Left frontotemporal craniotomy of anterior communicating artery aneurysm clipping, 10/8 History of seizures - POD#2 from Acomm clipping - Postoperative management per NSGY (primary) - Goal SBP 120-160 - Cardene gtt titrated to goal SBP, requirements decreasing - Multimodal pain management - AEDs (Keppra, topiramate) - Phenergan PRN for nausea - Seizure precautions - Neuroprotective measures: HOB > 30 degrees, normoglycemia, normothermia, electrolytes WNL - PT/OT/SLP as indicated  L periorbital edema L eyebrow droop L eye ptosis - Supportive care - Monitor  OSA (not using CPAP) Tobacco abuse - Supplemental O2 support as needed - Wean O2 for sat > 90% - CPAP QHS + PRN - Supplemental O2 support as needed - Bronchodilators (DuoNeb) PRN - Pulmonary hygiene - Nicotine patch  HTN HLD - SBP management as above, goal 120-160 - Cardene gtt as above, wean off as able - Continue amlodipine, losartan (home meds); hydralazine PO added 10/10 - IV Hydralazine + labelalol PRN - ASA/statin  T2DM - SSI - CBGs Q4H - Goal CBG 140-180  CKD stage 3b - Trend BMP - Replete electrolytes as indicated - Monitor I&Os - Avoid nephrotoxic agents as able - Ensure adequate renal perfusion  GERD -  PPI  BPH - Continue  alfuzosin - Flomax discontinued; per Uro note 07/2022 concerns re: efficacy and side effects  Anxiety/depression - Continue duloxetine  Best Practice: (right click and "Reselect all SmartList Selections" daily)   Diet/type: Regular consistency (see orders) (ADAT per NSGY) DVT prophylaxis: SCDs GI prophylaxis: PPI Lines: N/A and Arterial Line Foley:  N/A Code Status:  full code Last date of multidisciplinary goals of care discussion [Per Primary Team]  Critical care time:   The patient is critically ill with multiple organ system failure and requires high complexity decision making for assessment and support, frequent evaluation and titration of therapies, advanced monitoring, review of radiographic studies and interpretation of complex data.   Critical Care Time devoted to patient care services, exclusive of separately billable procedures, described in this note is 35 minutes.  Tim Lair, PA-C [facility] 06/10/23 7:27 AM  Please see [email or web address] for pager details.  From 7A-7P if no response, please call [phone] After hours, please call ELink [phone]

## 2023-06-10 NOTE — TOC CM/SW Note (Signed)
Transition of Care [facility]) - Inpatient Brief Assessment   Patient Details  Name: Brian [name] MRN: [id number] Date of Birth: 04-Dec-1955  [facility]) CM/SW Contact:    Mearl Latin, LCSW Phone Number: 06/10/2023, 3:57 PM   Clinical Narrative: Patient admitted from home undergoing treatment for cerebral aneurysm. Patient is currently disoriented. No current TOC needs identified but will follow once therapy evaluations are complete.    Transition of Care Asessment: Insurance and Status: Insurance coverage has been reviewed Patient has primary care physician: Yes Home environment has been reviewed: From home Prior level of function:: Independent Prior/Current Home Services: No current home services Social Determinants of Health Reivew: SDOH reviewed no interventions necessary Readmission risk has been reviewed: Yes Transition of care needs: no transition of care needs at this time

## 2023-06-10 NOTE — Plan of Care (Signed)
  Problem: Clinical Measurements: Goal: Usual level of consciousness will be regained or maintained. Outcome: Progressing Goal: Neurologic status will improve Outcome: Progressing Goal: Ability to maintain intracranial pressure will improve Outcome: Progressing   Problem: Skin Integrity: Goal: Demonstration of wound healing without infection will improve Outcome: Progressing

## 2023-06-11 ENCOUNTER — Encounter (HOSPITAL_COMMUNITY): Payer: Self-pay | Admitting: Neurosurgery

## 2023-06-11 DIAGNOSIS — I671 Cerebral aneurysm, nonruptured: Secondary | ICD-10-CM | POA: Diagnosis not present

## 2023-06-11 LAB — GLUCOSE, CAPILLARY
Glucose-Capillary: 111 mg/dL — ABNORMAL HIGH (ref 70–99)
Glucose-Capillary: 122 mg/dL — ABNORMAL HIGH (ref 70–99)
Glucose-Capillary: 128 mg/dL — ABNORMAL HIGH (ref 70–99)
Glucose-Capillary: 106 mg/dL — ABNORMAL HIGH (ref 70–99)
Glucose-Capillary: 143 mg/dL — ABNORMAL HIGH (ref 70–99)
Glucose-Capillary: 127 mg/dL — ABNORMAL HIGH (ref 70–99)

## 2023-06-11 LAB — CBC
HCT: 34.4 % — ABNORMAL LOW (ref 39.0–52.0)
Hemoglobin: 11.1 g/dL — ABNORMAL LOW (ref 13.0–17.0)
MCH: 32 pg (ref 26.0–34.0)
MCHC: 32.3 g/dL (ref 30.0–36.0)
MCV: 99.1 fL (ref 80.0–100.0)
Platelets: 162 10*3/uL (ref 150–400)
RBC: 3.47 MIL/uL — ABNORMAL LOW (ref 4.22–5.81)
RDW: 13.9 % (ref 11.5–15.5)
WBC: 13.8 10*3/uL — ABNORMAL HIGH (ref 4.0–10.5)
nRBC: 0 % (ref 0.0–0.2)

## 2023-06-11 LAB — BASIC METABOLIC PANEL
Anion gap: 15 (ref 5–15)
BUN: 26 mg/dL — ABNORMAL HIGH (ref 8–23)
CO2: 18 mmol/L — ABNORMAL LOW (ref 22–32)
Calcium: 8.7 mg/dL — ABNORMAL LOW (ref 8.9–10.3)
Chloride: 112 mmol/L — ABNORMAL HIGH (ref 98–111)
Creatinine, Ser: 0.99 mg/dL (ref 0.61–1.24)
GFR, Estimated: >60 mL/min (ref >60)
Glucose, Bld: 118 mg/dL — ABNORMAL HIGH (ref 70–99)
Potassium: 3.5 mmol/L (ref 3.5–5.1)
Sodium: 145 mmol/L (ref 135–145)

## 2023-06-11 LAB — MAGNESIUM: Magnesium: 2.1 mg/dL (ref 1.7–2.4)

## 2023-06-11 MED ORDER — DEXMEDETOMIDINE HCL IN NACL 400 MCG/100ML IV SOLN
0.0000 ug/kg/h | INTRAVENOUS | Status: DC
  Administered 2023-06-11 – 2023-06-12 (×2): 0.4 ug/kg/h via INTRAVENOUS
  Filled 2023-06-11 (×2): qty 100
  Filled 2023-06-11: qty 200

## 2023-06-11 MED ORDER — THIAMINE HCL 100 MG/ML IJ SOLN: 100.0000 mg | Freq: Every day | INTRAMUSCULAR | Status: DC

## 2023-06-11 MED ORDER — POTASSIUM CHLORIDE CRYS ER 20 MEQ PO TBCR
40.0000 meq | EXTENDED_RELEASE_TABLET | Freq: Once | ORAL | Status: AC
  Administered 2023-06-11: 40 meq via ORAL
  Filled 2023-06-11: qty 2

## 2023-06-11 MED ORDER — LORAZEPAM 2 MG/ML IJ SOLN
1.0000 mg | Freq: Once | INTRAMUSCULAR | Status: AC
  Administered 2023-06-11: 1 mg via INTRAVENOUS
  Filled 2023-06-11: qty 1

## 2023-06-11 MED ORDER — QUETIAPINE FUMARATE 25 MG PO TABS
50.0000 mg | ORAL_TABLET | Freq: Two times a day (BID) | ORAL | Status: DC
  Administered 2023-06-11 – 2023-06-15 (×9): 50 mg via ORAL
  Filled 2023-06-11 (×9): qty 2

## 2023-06-11 MED ORDER — QUETIAPINE FUMARATE 25 MG PO TABS
25.0000 mg | ORAL_TABLET | Freq: Two times a day (BID) | ORAL | Status: DC
  Administered 2023-06-11: 25 mg via ORAL
  Filled 2023-06-11: qty 1

## 2023-06-11 MED ORDER — THIAMINE MONONITRATE 100 MG PO TABS
100.0000 mg | ORAL_TABLET | Freq: Every day | ORAL | Status: DC
  Administered 2023-06-11 – 2023-07-05 (×25): 100 mg via ORAL
  Filled 2023-06-11 (×25): qty 1

## 2023-06-11 MED ORDER — HALOPERIDOL LACTATE 5 MG/ML IJ SOLN
5.0000 mg | Freq: Four times a day (QID) | INTRAMUSCULAR | Status: DC | PRN
  Administered 2023-06-11: 5 mg via INTRAVENOUS
  Filled 2023-06-11: qty 1

## 2023-06-11 MED ORDER — LORAZEPAM 2 MG/ML IJ SOLN
1.0000 mg | INTRAMUSCULAR | Status: DC | PRN
  Administered 2023-06-12 – 2023-06-16 (×9): 1 mg via INTRAVENOUS
  Filled 2023-06-11 (×9): qty 1

## 2023-06-11 MED ORDER — DILTIAZEM HCL 60 MG PO TABS
60.0000 mg | ORAL_TABLET | Freq: Four times a day (QID) | ORAL | Status: DC
  Administered 2023-06-11: 60 mg via ORAL
  Filled 2023-06-11: qty 1

## 2023-06-11 MED ORDER — HEPARIN SODIUM (PORCINE) 5000 UNIT/ML IJ SOLN
5000.0000 [IU] | Freq: Three times a day (TID) | INTRAMUSCULAR | Status: DC
  Administered 2023-06-11 – 2023-07-05 (×72): 5000 [IU] via SUBCUTANEOUS
  Filled 2023-06-11 (×74): qty 1

## 2023-06-11 MED ORDER — TAMSULOSIN HCL 0.4 MG PO CAPS
0.4000 mg | ORAL_CAPSULE | Freq: Every day | ORAL | Status: DC
  Administered 2023-06-11 – 2023-07-05 (×25): 0.4 mg via ORAL
  Filled 2023-06-11 (×25): qty 1

## 2023-06-11 MED ORDER — DILTIAZEM HCL-DEXTROSE 125-5 MG/125ML-% IV SOLN (PREMIX)
5.0000 mg/h | INTRAVENOUS | Status: DC
  Administered 2023-06-11: 10 mg/h via INTRAVENOUS
  Administered 2023-06-11 – 2023-06-13 (×2): 5 mg/h via INTRAVENOUS
  Filled 2023-06-11 (×5): qty 125

## 2023-06-11 NOTE — Progress Notes (Signed)
PCCM Interval Progress Note:  Overnight, patient had new onset of Afib with RVR, Diltiazem gtt was initiated. Additionally, patient was agitated and confused requiring Haldol 5mg  IV, which had limited efficacy. Ultimately, Precedex gtt was started (weaned down and discontinued this AM).  Diltiazem gtt downtitrated throughout the day with plan for conversion to PO therapy. Dilt gtt stopped ~1340 and patient continued in Afib with maintained rates in 110s.   At approximately 1530, RN noted patient to be in Aflutter with rates 140s-160s. EKG obtained demonstrated Aflutter in 140s. Diltiazem gtt was restarted at 10/hr. Patient subsequently converted to NSR in 80s.  Patient has had intermittent confusion throughout the day with mild agitation; this has been manageable with Posey belt, activity belt, redirection. Later in the afternoon, agitation worsened somewhat with patient trying to get OOB, mildly combative and not being redirectable - required Ativan 1mg  x 1 with improvement in agitation and redirectability. Patient continues to have delirium/intermittent hallucinations, as he is reaching at the ceiling, etc.   Weaning down diltiazem gtt as able, patient remains in NSR. Seroquel dose increased to 50mg  BID. Will monitor closely, Ativan available PRN for significant agitation. Avoiding Precedex gtt resumption as able, but this will remain available as needed.  Tim Lair, PA-C  [facility] 06/11/23 6:56 PM  Please see [email or web address] for pager details.  From 7A-7P if no response, please call [phone] After hours, please call ELink [phone]

## 2023-06-11 NOTE — Progress Notes (Addendum)
Dilt gtt titrated down to 10mg /hr per CCM team, PO dilt ordered and given 1140. Per order dilt gtt stopped 2hr post PO administration. Rates remained 110s until approximately 1515 when patient sustained 140-160 and converted to aflutter. Patient remains asymptomatic. EKG obtained, PA notified, order to restart dilt gtt. Patient converted to NSR 1544.   Patient required I&Ox2 during shift. Bladder scan 1030 revealed with I/O for , 1630 bladder scan with I/O for .   1800 MD N. Nundkumar updated on days events.

## 2023-06-11 NOTE — Progress Notes (Signed)
NAME:  Brian [name], MRN:  [id number], DOB:  [birth year], LOS: 3 ADMISSION DATE:  06/08/2023, CONSULTATION DATE:  06/08/2023 REFERRING MD: Conchita Paris - NSGY CHIEF COMPLAINT: S/p L frontotemporal craniotomy, Acomm clipping    History of Present Illness:  67 year old man who presented to [facility] 10/8 for planned management of cerebral aneurysm (detected incidentally on MRI Brain during seizure workup. PMHx significant for HTN, OSA (not on CPAP), tobacco abuse, T2DM (diet-controlled), GERD, anxiety/depression, seizure disorder (iniital seizure age 23, another ~1 month PTA), and CKD stage 3b. Patient underwent scheduled left frontotemporal craniotomy with anterior communicating artery aneurysm clipping 10/8 with NSGY (Dr. Conchita Paris).  PCCM consulted for postoperative management.  Pertinent Medical History:  HTN, OSA (not on CPAP), tobacco abuse, T2DM (diet-controlled), GERD, anxiety/depression, seizure disorder (iniital seizure age 78, another ~1 month PTA), and CKD stage 3b  Significant Hospital Events: Including procedures, antibiotic start and stop dates in addition to other pertinent events   10/8 - Underwent scheduled L frontotemporal craniotomy with Acomm artery aneurysm clipping 10/9 - CPAP PRN, mental status improved. Remains on Cardene gtt. 10/10 - Cardene gtt weaned off. A-line removed per NSGY. Confused in afternoon requiring sitter, mitts. Redirectable. 10/11 - Agitated overnight requiring Haldol, no significant response. Noted to be in Afib RVR with rates 150s. Diltiazem gtt started. Low-dose Precedex gtt for agitation.  Interim History / Subjective:  Agitated overnight with episodes of tachycardia Haldol given with limited efficacy Precedex added at low dose Later episode of Afib RVR to 150s requiring diltiazem gtt initiation This AM, calm/cooperative, remains mildly confused L periorbital swelling continues to improve  Objective:  Blood pressure 100/70, pulse (!) 107, temperature  98.9 F (37.2 C), temperature source Axillary, resp. rate 19, height 6' (1.829 m), weight 90.7 kg, SpO2 95%.        Intake/Output Summary (Last 24 hours) at 06/11/2023 0811 Last data filed at 06/11/2023 0700 Gross per 24 hour  Intake 989.12 ml  Output 1950 ml  Net -960.88 ml   Filed Weights   06/08/23 0804  Weight: 90.7 kg   Physical Examination: General: Acutely ill-appearing older man in NAD. HEENT: Normocephalic, L frontotemporal incision with dried blood on dressing, staple closure. Slightly improved L periorbital edema. Anicteric sclera, PERRL 2.65mm, slightly dry mucous membranes.  Neuro:  Drowsy, wakes easily to voice. Lightly sedated on Precedex.  Responds to verbal stimuli. Following commands intermittently. Moves all 4 extremities spontaneously. No focal weakness. Mild L eye ptosis/brow droop. CV: Irregularly irregular rhythm, rate 70s-80s. III/VI systolic murmur noted at LLSB. PULM: Breathing even and unlabored on 3LNC. Lung fields CTAB. GI: Soft, nontender, nondistended. Normoactive bowel sounds. Extremities: No LE edema noted. Skin: Warm/dry, no rashes. Incision as above.  Resolved Hospital Problem List:    Assessment & Plan:  Left frontotemporal craniotomy of anterior communicating artery aneurysm clipping, 10/8 History of seizures - POD#3 from Acomm clipping - Postoperative management per NSGY (primary) - Goal SBP < 160 - Cardene gtt weaned off, discontinued 10/10 - Multimodal pain management - AEDs: topiramate; Keppra discontinued - Phenergan PRN for nausea - Seizure precautions - Neuroprotective measures: HOB > 30 degrees, normoglycemia, normothermia, electrolytes WNL - PT/OT/SLP  L periorbital edema L eyebrow droop L eye ptosis - Supportive care, monitor  Delirium, likely multifactorial in the setting of ICU, brain surgery - Seroquel 25mg  BID - Limit interruptions to sleep/wake cycle as able - Limiting sedating medications as able - Minimize  tethers  OSA (not using CPAP) Tobacco abuse - Supplemental O2 support  as needed - Wean O2 for sat > 90% - CPAP QHS and PRN - Bronchodilators (DuoNebs) PRN - Pulmonary hygiene - Nicotine patch  Afib with RVR Hypokalemia New Afib with RVR noted 10/10PM to 150s. - SQH started, ok per NSGY - Diltiazem gtt started overnight for rate control, titrating off as able - Optimize electrolyes (K > 4, Mg > 2), K repleted 10/11AM - Cardiac monitoring  HTN HLD - SBP management as above, goal < 160 - Off of Cardene gtt. Diltiazem started 10/10PM for Afib RVR, titrating off - Continue amlodipine, losartan, hydralazine - IV Labetalol/Hydralazine PRN - ASA/statin  T2DM - SSI - CBGs Q4H - Goal CBG 140-180  CKD stage 3b - Trend BMP - Replete electrolytes as indicated - Monitor I&Os - Avoid nephrotoxic agents as able - Ensure adequate renal perfusion  GERD - PPI  BPH - Continue alfuzosin - Flomax discontinued, per Uro note 07/2022 concerns re: efficacy and side effects  Anxiety/depression - Continue duloxetine  Best Practice: (right click and "Reselect all SmartList Selections" daily)   Diet/type: Regular consistency (see orders) (ADAT per NSGY) DVT prophylaxis: SCDs GI prophylaxis: PPI Lines: N/A and Arterial Line Foley:  N/A Code Status:  full code Last date of multidisciplinary goals of care discussion [Per Primary Team]  Critical care time:   The patient is critically ill with multiple organ system failure and requires high complexity decision making for assessment and support, frequent evaluation and titration of therapies, advanced monitoring, review of radiographic studies and interpretation of complex data.   Critical Care Time devoted to patient care services, exclusive of separately billable procedures, described in this note is 36 minutes.  Tim Lair, PA-C [facility] 06/11/23 8:11 AM  Please see [email or web address] for pager  details.  From 7A-7P if no response, please call [phone] After hours, please call ELink [phone]

## 2023-06-11 NOTE — Progress Notes (Signed)
  NEUROSURGERY PROGRESS NOTE   patient was agitated overnight, started on precedex. this appeared to precipitate an episode of atrial fibrillation with rapid ventricular response. also started on IV cardizem  EXAM:  BP (!) 86/66   Pulse 84   Temp 98.6 F (37 C) (Axillary)   Resp 16   Ht 6' (1.829 m)   Wt 90.7 kg   SpO2 97%   BMI 27.12 kg/m   arouses to voice. cranial nerves appear grossly intact. moves all extremities to command symmetrically.  IMPRESSION:  67 y.o. male  post operative day number 3 elective left pterional craniotomy for a Acom clipping. remains drowsy but non-focal exam. post-operative CT was reassuring.  PLAN: - we will continue to attempt to wean sedation and iv cardizem. we can discontinue his keppra. okay for DVT prophylaxis with heparin.   Lisbeth Renshaw, MD [facility]

## 2023-06-11 NOTE — Progress Notes (Signed)
PT Cancellation Note  Patient Details Name: Brian [name] MRN: [id number] DOB: 1956-05-18   Cancelled Treatment:    Reason Eval/Treat Not Completed: (P) Patient not medically ready. HR elevated at rest with moments of it jumping up to as high as 167 bpm currently. RN requesting hold off on PT today. Will plan to follow-up another day as able.   Virgil Benedict, PT, DPT Acute Rehabilitation Services  Office: [phone]    Bettina Gavia 06/11/2023, 12:12 PM

## 2023-06-11 NOTE — Progress Notes (Signed)
OT Cancellation Note  Patient Details Name: [name] MRN: [id number] DOB: 1956-06-08   Cancelled Treatment:    Reason Eval/Treat Not Completed: Patient not medically ready.  Pt's resting HR currently from 120-150 BPM at rest.  Will hold off for now and check  back as pt is more medically stable.    Perrin Maltese, OTR/L Acute Rehabilitation Services  Office [phone] 06/11/2023

## 2023-06-12 DIAGNOSIS — I671 Cerebral aneurysm, nonruptured: Secondary | ICD-10-CM | POA: Diagnosis not present

## 2023-06-12 LAB — CBC
HCT: 38.4 % — ABNORMAL LOW (ref 39.0–52.0)
Hemoglobin: 12.6 g/dL — ABNORMAL LOW (ref 13.0–17.0)
MCH: 31.4 pg (ref 26.0–34.0)
MCHC: 32.8 g/dL (ref 30.0–36.0)
MCV: 95.8 fL (ref 80.0–100.0)
Platelets: 201 10*3/uL (ref 150–400)
RBC: 4.01 MIL/uL — ABNORMAL LOW (ref 4.22–5.81)
RDW: 13.6 % (ref 11.5–15.5)
WBC: 11.3 10*3/uL — ABNORMAL HIGH (ref 4.0–10.5)
nRBC: 0 % (ref 0.0–0.2)

## 2023-06-12 LAB — BASIC METABOLIC PANEL
Anion gap: 10 (ref 5–15)
BUN: 25 mg/dL — ABNORMAL HIGH (ref 8–23)
CO2: 18 mmol/L — ABNORMAL LOW (ref 22–32)
Calcium: 8.6 mg/dL — ABNORMAL LOW (ref 8.9–10.3)
Chloride: 115 mmol/L — ABNORMAL HIGH (ref 98–111)
Creatinine, Ser: 1.41 mg/dL — ABNORMAL HIGH (ref 0.61–1.24)
GFR, Estimated: 55 mL/min — ABNORMAL LOW (ref >60)
Glucose, Bld: 111 mg/dL — ABNORMAL HIGH (ref 70–99)
Potassium: 3.1 mmol/L — ABNORMAL LOW (ref 3.5–5.1)
Sodium: 143 mmol/L (ref 135–145)

## 2023-06-12 LAB — GLUCOSE, CAPILLARY
Glucose-Capillary: 104 mg/dL — ABNORMAL HIGH (ref 70–99)
Glucose-Capillary: 108 mg/dL — ABNORMAL HIGH (ref 70–99)
Glucose-Capillary: 106 mg/dL — ABNORMAL HIGH (ref 70–99)
Glucose-Capillary: 110 mg/dL — ABNORMAL HIGH (ref 70–99)
Glucose-Capillary: 122 mg/dL — ABNORMAL HIGH (ref 70–99)
Glucose-Capillary: 104 mg/dL — ABNORMAL HIGH (ref 70–99)

## 2023-06-12 LAB — PHOSPHORUS: Phosphorus: 2.2 mg/dL — ABNORMAL LOW (ref 2.5–4.6)

## 2023-06-12 LAB — MAGNESIUM: Magnesium: 2.1 mg/dL (ref 1.7–2.4)

## 2023-06-12 MED ORDER — K PHOS MONO-SOD PHOS DI & MONO 155-852-130 MG PO TABS
250.0000 mg | ORAL_TABLET | Freq: Once | ORAL | Status: AC
  Administered 2023-06-12: 250 mg via ORAL
  Filled 2023-06-12: qty 1

## 2023-06-12 MED ORDER — ORAL CARE MOUTH RINSE: 15.0000 mL | OROMUCOSAL | Status: DC | PRN

## 2023-06-12 MED ORDER — POTASSIUM CHLORIDE CRYS ER 20 MEQ PO TBCR
40.0000 meq | EXTENDED_RELEASE_TABLET | Freq: Once | ORAL | Status: AC
  Administered 2023-06-12: 40 meq via ORAL
  Filled 2023-06-12: qty 2

## 2023-06-12 NOTE — Progress Notes (Signed)
eLink Physician-Brief Progress Note Patient Name: Brian [name] DOB: 1956/03/01 MRN: [id number]   Date of Service  06/12/2023  HPI/Events of Note  Patient reportedly very restless, disturbing and trying to remove life-saving equipment.  RN asking for bilateral soft wrist restraints.  eICU Interventions  Patient's chart reviewed.  Pertinent labs and imaging studies reviewed.  Video exam done.  Patient is calm and sedated on dexmedetomidine.  Will continue dexmedetomidine at this point.  No indication for restraints. I explained this in detail to bedside nurse.     Intervention Category Minor Interventions: Agitation / anxiety - evaluation and management  Carilyn Goodpasture 06/12/2023, 10:48 PM

## 2023-06-12 NOTE — Evaluation (Signed)
Occupational Therapy Evaluation Patient Details Name: Brian [name] MRN: [id number] DOB: 23-Feb-1956 Today's Date: 06/12/2023   History of Present Illness Pt is a 67 y.o. male who presented 06/08/23 for left frontotemporal craniotomy for clipping of anterior communicating artery aneurysm. Hospital course complicated by pt agitation and afib with RVR. CT head 10/9 showed mild to moderate bifrontal pneumocephalus and small volume of extra-axial hemorrhage with mild mass effect on the frontal lobes without midline shift. PMH: OSA (not using the CPAP), HTN, diet controlled DM-2, tobacco smoking for 52 yrs, GERD, anxiety, depression, CVA, seizure disorder, and CKD-3b   Clinical Impression   Pt currently lethargic and kept eyes closed 99% of session with resistance and slight agitation to movement.  He was not able to follow any one step commands and was not able to answer any questions regarding orientation, however he did produce a couple mumbles when asked where he was.  Total assist for supine to sit with pt resisting movement.  Once sitting, he needed max assist for static sitting balance and was unable to help initiate attempt to scoot up the EOB or stand.  Vitals stable in supine with oxygen sats at 96% on room air and BP in supine at 131/78 and 167/77 in sitting.  Unsure of pt's home setup or supervision level but will likely need 24 hour assist at discharge.  Recommend acute care OT to help initiate progress with ADL tasks, attention, problem solving, and functional transfers.  Feel depending on progress the next few days, pt may benefit from intensive inpatient follow up therapy, >3 hours/day.           If plan is discharge home, recommend the following: Two people to help with walking and/or transfers;Two people to help with bathing/dressing/bathroom;Assist for transportation;Direct supervision/assist for medications management;Supervision due to cognitive status;Help with stairs or ramp for  entrance;Direct supervision/assist for financial management;Assistance with feeding;Assistance with cooking/housework    Functional Status Assessment  Patient has had a recent decline in their functional status and demonstrates the ability to make significant improvements in function in a reasonable and predictable amount of time.  Equipment Recommendations  Other (comment) (TBD next venue of care)    Recommendations for Other Services Rehab consult     Precautions / Restrictions Precautions Precautions: Fall;Other (comment) Precaution Comments: posey belt; bil mittens Restrictions Weight Bearing Restrictions: No      Mobility Bed Mobility Overal bed mobility: Needs Assistance Bed Mobility: Rolling, Supine to Sit, Sit to Supine Rolling: Total assist   Supine to sit: Total assist Sit to supine: Total assist   General bed mobility comments: Total assist for all aspects of supine to sit and sit to supine secondary to not being able to follow commands.  Pt did roll spontaneously to the left side with min assist on his own.    Transfers                          Balance Overall balance assessment: Needs assistance Sitting-balance support: Single extremity supported, No upper extremity supported, Feet supported Sitting balance-Leahy Scale: Zero Sitting balance - Comments: Pt unable to maintain static sitting balance this session.                                   ADL either performed or assessed with clinical judgement   ADL Overall ADL's : Needs assistance/impaired  Grooming: Therapist, nutritional;Bed level;Total assistance                               Functional mobility during ADLs: Total assistance (supine to sit EOB) General ADL Comments: Pt with eyes closed throughout session.  BP in supine at 131/78 and then in sitting at 167/77.  Oxygen at 96% on room air with HR in the upper 70s.  Pt required total assist for supine to sit  secondary to decreased attention and resistance to therapist moving him.  Once sitting he would pull forward or backwards with his trunk but still maintained eyes closed and would not follow any one step commands.     Vision   Additional Comments: Pt kept eyes closed during session, unable to assess            Pertinent Vitals/Pain Pain Assessment Pain Assessment: Faces Faces Pain Scale: No hurt     Extremity/Trunk Assessment Upper Extremity Assessment Upper Extremity Assessment: Difficult to assess due to impaired cognition   Lower Extremity Assessment Lower Extremity Assessment: Defer to PT evaluation RLE Deficits / Details: gross functional incoordination; symmetrical MMT scores of 4+ to 5 bil; reported some numbness/tingling bil, agreeing it was baseline neuropathy (unsure of accuracy of report though as pt started to perseverate on yes/no at times) RLE Coordination: decreased gross motor LLE Deficits / Details: gross functional incoordination; symmetrical MMT scores of 4+ to 5 bil; reported some numbness/tingling bil, agreeing it was baseline neuropathy (unsure of accuracy of report though as pt started to perseverate on yes/no at times) LLE Coordination: decreased gross motor   Cervical / Trunk Assessment Cervical / Trunk Assessment: Other exceptions Cervical / Trunk Exceptions: turns head to the left in supine with resitance at times to correction   Communication Communication Communication: No apparent difficulties   Cognition Arousal: Lethargic   Overall Cognitive Status: Impaired/Different from baseline Area of Impairment: Attention, Following commands, Awareness                 Orientation Level: Disoriented to, Place Current Attention Level: Focused (decreased focused attention)   Following Commands:  (not following any commands during session) Safety/Judgement: Decreased awareness of safety, Decreased awareness of deficits   Problem Solving: Slow  processing, Decreased initiation, Requires verbal cues, Requires tactile cues General Comments: Pt with eyes closed 99% of session and would not open to verbal or noxius stimulation with washcloth.  Did not attempt or follow any commands at a times was resistive to movement.     General Comments  VSS on RA; noted L eye more closed than R, unsure if due to edema from L crani or not, RN aware; upon return with blankets to cover pt at end of session noted pt moving R UE in a rhythmic pattern repeatedly, consisting of elbow flexion then rapid wrist ulnar and radial deviation then it would return to rest and repeat but pt able to respond verbally during these episodes (actually calmed down when he would get distracted) - RN reports this was noted earlier today and already made MD aware            Home Living Family/patient expects to be discharged to:: Unsure                                 Additional Comments: pt not oriented and no family present to  provide info      Prior Functioning/Environment Prior Level of Function : Patient poor historian/Family not available             Mobility Comments: suspect pt was independent but pt is a poor historian currently and no family present to confirm          OT Problem List: Decreased strength;Impaired balance (sitting and/or standing);Decreased safety awareness;Decreased cognition;Decreased activity tolerance;Decreased knowledge of use of DME or AE      OT Treatment/Interventions: Self-care/ADL training;Therapeutic activities;Balance training;Neuromuscular education;Patient/family education;Manual therapy;Cognitive remediation/compensation    OT Goals(Current goals can be found in the care plan section) Acute Rehab OT Goals Patient Stated Goal: Pt unable to state during session OT Goal Formulation: Patient unable to participate in goal setting Time For Goal Achievement: 06/26/23 Potential to Achieve Goals: Fair  OT  Frequency: Min 1X/week       AM-PAC OT "6 Clicks" Daily Activity     Outcome Measure Help from another person eating meals?: Total Help from another person taking care of personal grooming?: Total Help from another person toileting, which includes using toliet, bedpan, or urinal?: Total Help from another person bathing (including washing, rinsing, drying)?: Total Help from another person to put on and taking off regular upper body clothing?: Total Help from another person to put on and taking off regular lower body clothing?: Total 6 Click Score: 6   End of Session Nurse Communication: Mobility status (BP elevation)  Activity Tolerance: Patient limited by lethargy Patient left: in bed;with call bell/phone within reach;with bed alarm set  OT Visit Diagnosis: Unsteadiness on feet (R26.81);Other abnormalities of gait and mobility (R26.89);Muscle weakness (generalized) (M62.81)                Time: 3557-3220 OT Time Calculation (min): 32 min Charges:  OT General Charges $OT Visit: 1 Visit OT Evaluation $OT Eval High Complexity: 1 High OT Treatments $Self Care/Home Management : 8-22 mins Perrin Maltese, OTR/L Acute Rehabilitation Services  Office [phone] 06/12/2023

## 2023-06-12 NOTE — Progress Notes (Signed)
Patient having rhythmic bilateral LE and facial movments. CCM Md was brought into room d/t suspicion for seizures.  Patient verbally responded to MD when his name was called.  At this time it was determined by care team that patient was not seizing.

## 2023-06-12 NOTE — Progress Notes (Signed)
NAME:  Brian [name], MRN:  [id number], DOB:  13-Feb-1956, LOS: 4 ADMISSION DATE:  06/08/2023, CONSULTATION DATE:  06/08/2023 REFERRING MD: Conchita Paris - NSGY CHIEF COMPLAINT: S/p L frontotemporal craniotomy, Acomm clipping    History of Present Illness:  67 year old man who presented to [facility] 10/8 for planned management of cerebral aneurysm (detected incidentally on MRI Brain during seizure workup. PMHx significant for HTN, OSA (not on CPAP), tobacco abuse, T2DM (diet-controlled), GERD, anxiety/depression, seizure disorder (iniital seizure age 73, another ~1 month PTA), and CKD stage 3b. Patient underwent scheduled left frontotemporal craniotomy with anterior communicating artery aneurysm clipping 10/8 with NSGY (Dr. Conchita Paris).  PCCM consulted for postoperative management.  Pertinent Medical History:  HTN, OSA (not on CPAP), tobacco abuse, T2DM (diet-controlled), GERD, anxiety/depression, seizure disorder (iniital seizure age 38, another ~1 month PTA), and CKD stage 3b  Significant Hospital Events: Including procedures, antibiotic start and stop dates in addition to other pertinent events   10/8 - Underwent scheduled L frontotemporal craniotomy with Acomm artery aneurysm clipping 10/9 - CPAP PRN, mental status improved. Remains on Cardene gtt. 10/10 - Cardene gtt weaned off. A-line removed per NSGY. Confused in afternoon requiring sitter, mitts. Redirectable. 10/11 - Agitated overnight requiring Haldol, no significant response. Noted to be in Afib RVR with rates 150s. Diltiazem gtt started. Low-dose Precedex gtt for agitation. 10/12-off diltiazem, off Precedex, as needed Ativan  Interim History / Subjective:  Less agitated but still requiring Ativan as needed Off diltiazem Arousable, redirectable, Does not appear very uncomfortable Remains confused Foley placed for retention  Objective:  Blood pressure (!) 129/90, pulse 75, temperature 98.4 F (36.9 C), temperature source Axillary, resp.  rate 19, height 6' (1.829 m), weight 90.7 kg, SpO2 95%.        Intake/Output Summary (Last 24 hours) at 06/12/2023 0826 Last data filed at 06/12/2023 0600 Gross per 24 hour  Intake 954.45 ml  Output 2425 ml  Net -1470.55 ml   Filed Weights   06/08/23 0804  Weight: 90.7 kg   Physical Examination: General: Middle-age, does not appear acutely ill HEENT: Normocephalic, some swelling around recent site of surgery-crani.  Does arouse, not following commands CV: S1-S2 appreciated, regular pulse PULM: Clear breath sounds to auscultation GI: Soft, nontender, bowel sounds appreciated Extremities: No LE edema noted. Skin: Warm/dry, no rashes. Incision as above.  Resolved Hospital Problem List:    Assessment & Plan:   Left frontotemporal craniotomy Clipping of anterior communicating artery aneurysm 10/8 History of seizures -Postoperative day 4 -Neurosurgery continues to follow -Goal systolic blood pressure less than 160 -Pain management -Antiepileptic drug include topiramate -Seizure precautions -Neuroprotective measures- normothermia, euglycemia, HOB greater than 30, head in neutral alignment, normocapnia, normoxia  Left periorbital edema -Related to recent surgery -Continue to monitor  A-fib with RVR -Optimize electrolytes including potassium and magnesium -Was able to wean off diltiazem -Rhythm is sinus at present  Hypertension, hyperlipidemia -IV labetalol/hydralazine as needed -Aspirin/statin--on Lipitor -On losartan, hydralazine-as needed  Multifactorial delirium -On Seroquel 50 mg twice daily -On a Posey belt at present  History of obstructive sleep apnea -Was not on CPAP at home -History of tobacco abuse -Nicotine patch in place -Continue bronchodilators as needed -Maintain saturations greater than 90%  Type 2 diabetes -Continue SSI  Chronic kidney disease stage IIIb -Replete electrolytes -Maintain renal perfusion -Avoid nephrotoxic  medications  GERD -Continue PPI  BPH -On Flomax  History of anxiety/depression -On duloxetine    Best Practice: (right click and "Reselect all SmartList Selections" daily)  Diet/type: Regular consistency (see orders) (ADAT per NSGY) DVT prophylaxis: SCDs GI prophylaxis: PPI Lines: N/A and Arterial Line Foley:  N/A Code Status:  full code Last date of multidisciplinary goals of care discussion [Per Primary Team]  The patient is critically ill with multiple organ systems failure and requires high complexity decision making for assessment and support, frequent evaluation and titration of therapies, application of advanced monitoring technologies and extensive interpretation of multiple databases. Critical Care Time devoted to patient care services described in this note independent of APP/resident time (if applicable)  is 31 minutes.   Virl Diamond MD [facility] Personal pager: See Amion If unanswered, please page CCM On-call: #[phone]

## 2023-06-12 NOTE — Progress Notes (Signed)
Inpatient Rehab Admissions Coordinator:   Per therapy recommendations, patient was screened for CIR candidacy by Megan Salon, MS, CCC-SLP. At this time, Pt. is currently lethargic, requiring sedation and not yet a level to tolerate the intensity of CIR.  Pt. may have potential to progress to becoming a potential CIR candidate, so CIR admissions team will follow and monitor for progress and participation with therapies and place consult order if Pt. appears to be an appropriate candidate. Please contact me with any questions.    Megan Salon, MS, CCC-SLP Rehab Admissions Coordinator  [phone] (celll) [phone] (office)

## 2023-06-12 NOTE — Progress Notes (Addendum)
Pharmacy Electrolyte Replacement  Recent Labs:  Recent Labs    06/12/23 0608  K 3.1*  MG 2.1  PHOS 2.2*  CREATININE 1.41*    Low Critical Values (K </= 2.5, Phos </= 1, Mg </= 1) Present: None  MD Contacted: n/a  Plan:  - KPhos 250mg  PO x1 (each tab contains phosphorus 8 mMol, potassium 1.1 mEq) - Kcl 40 mEq PO x1  Aim for goal K >/= 4, Mg >/= 2  Rexford Maus, PharmD, BCPS 06/12/2023 9:11 AM

## 2023-06-12 NOTE — Progress Notes (Signed)
Subjective: NAEs o/n  Objective: Vital signs in last 24 hours: Temp:  [98.1 F (36.7 C)-99.4 F (37.4 C)] 98.4 F (36.9 C) (10/12 0800) Pulse Rate:  [75-159] 75 (10/12 0800) Resp:  [15-29] 19 (10/12 0800) BP: (102-160)/(41-119) 129/90 (10/12 0800) SpO2:  [75 %-97 %] 95 % (10/12 0800)  Intake/Output from previous day: 10/11 0701 - 10/12 0700 In: 981.7 [P.O.:860; I.V.:121.7] Out: 2425 [Urine:2425] Intake/Output this shift: No intake/output data recorded.  Eyes open Confused, drowsy, disoriented FC x 4 Incision c/d PERRL  Lab Results: Recent Labs    06/11/23 0645 06/12/23 0608  WBC 13.8* 11.3*  HGB 11.1* 12.6*  HCT 34.4* 38.4*  PLT 162 201   BMET Recent Labs    06/11/23 0645 06/12/23 0608  NA 145 143  K 3.5 3.1*  CL 112* 115*  CO2 18* 18*  GLUCOSE 118* 111*  BUN 26* 25*  CREATININE 0.99 1.41*  CALCIUM 8.7* 8.6*    Studies/Results: No results found.  Assessment/Plan: S/p Acomm aneurysm clipping  LOS: 4 days  - sedation PRN - BP control - hep subQ - will order MRI brain tomorrow if no improvement   [name] 06/12/2023, 9:17 AM

## 2023-06-12 NOTE — Evaluation (Signed)
Physical Therapy Evaluation Patient Details Name: Brian [name] MRN: [id number] DOB: 18-Nov-1955 Today's Date: 06/12/2023  History of Present Illness  Pt is a 67 y.o. male who presented 06/08/23 for left frontotemporal craniotomy for clipping of anterior communicating artery aneurysm. Hospital course complicated by pt agitation and afib with RVR. CT head 10/9 showed mild to moderate bifrontal pneumocephalus and small volume of extra-axial hemorrhage with mild mass effect on the frontal lobes without midline shift. PMH: OSA (not using the CPAP), HTN, diet controlled DM-2, tobacco smoking for 52 yrs, GERD, anxiety, depression, CVA, seizure disorder, and CKD-3b   Clinical Impression  Pt presents with condition above and deficits mentioned below, see PT Problem List. Pt is disoriented to location, situation, and time and no family is present to provide PLOF and home info. Suspect pt may have been independent PTA. Currently, pt is lethargic but arouses and opens his eyes once sitting EOB. He can also be impulsive and need redirecting. Noted L eye is more closed than R, unsure if due to edema from L crani or not, RN aware. Pt follows simple cues ~75% of the time once he is awake but needs extra time to process and initiate. He was able to follow cues to perform MMT of all x4 extremities with pt appearing to have symmetrical and WFL strength in all extremities. He required modA to transition supine <> sit EOB and to transfer to stand but maxA to take lateral steps along EOB with bil HHA for support. He is at high risk for falls. Noted, upon returning at end of session with blankets to cover pt, he was noted to be moving his R UE in a rhythmic pattern repeatedly, consisting of elbow flexion then rapid wrist ulnar and radial deviation then it would return to rest and repeat. Pt was able to respond verbally during these episodes (actually calmed down when he would get distracted). RN reports this was noted earlier  today and already made MD aware. As pt likely has had a drastic functional decline and will likely progress well once he is less lethargic and confused, he could greatly benefit from intensive inpatient rehab, > 3 hours/day. Will continue to follow acutely.         If plan is discharge home, recommend the following: A lot of help with walking and/or transfers;A lot of help with bathing/dressing/bathroom;Assistance with cooking/housework;Direct supervision/assist for financial management;Direct supervision/assist for medications management;Assist for transportation;Help with stairs or ramp for entrance;Supervision due to cognitive status   Can travel by private vehicle        Equipment Recommendations Other (comment) (TBA)  Recommendations for Other Services  Rehab consult    Functional Status Assessment Patient has had a recent decline in their functional status and demonstrates the ability to make significant improvements in function in a reasonable and predictable amount of time.     Precautions / Restrictions Precautions Precautions: Fall;Other (comment) Precaution Comments: posey belt; bil mittens Restrictions Weight Bearing Restrictions: No      Mobility  Bed Mobility Overal bed mobility: Needs Assistance Bed Mobility: Rolling, Supine to Sit, Sit to Supine Rolling: Total assist   Supine to sit: Mod assist, HOB elevated Sit to supine: Mod assist, HOB elevated   General bed mobility comments: Total assist to roll pt for bathing in bed with pt lethargic and keeping eyes closed. Pt impulsive to try to come to [place]-sitting, clearing his mid back but unable to come all the way up to [place] sit in bed  without assistance. Pt initiated transition to L to sit EOB when told "come on, let's get out of bed", but needed modA for trunk and to scoot hips to EOB. ModA to guide trunk when cued to return to supine.    Transfers Overall transfer level: Needs assistance Equipment used: 1 person  hand held assist Transfers: Sit to/from Stand Sit to Stand: Mod assist           General transfer comment: Pt cued to hold onto therapist anterior to him to facilitate an anterior weight shift, good success noted. Pt demonstrated good initiation to power up to stand with modA to power up, extend hips, and gain balance.    Ambulation/Gait Ambulation/Gait assistance: Max assist Gait Distance (Feet): 2 Feet Assistive device: 1 person hand held assist Gait Pattern/deviations: Decreased step length - right, Decreased step length - left, Decreased stride length, Decreased weight shift to left, Decreased weight shift to right, Shuffle Gait velocity: reduced Gait velocity interpretation: <1.31 ft/sec, indicative of household ambulator   General Gait Details: Pt cued to take lateral steps to L along EOB while pt held onto therapist anterior to him with Bil UEs. Pt needed assistance and tactile cues to weight shift to cue him to take steps. MaxA for balance and weight shifting.  Stairs            Wheelchair Mobility     Tilt Bed    Modified Rankin (Stroke Patients Only) Modified Rankin (Stroke Patients Only) Pre-Morbid Rankin Score: No symptoms Modified Rankin: Moderately severe disability     Balance Overall balance assessment: Needs assistance Sitting-balance support: Single extremity supported, No upper extremity supported, Feet supported Sitting balance-Leahy Scale: Poor Sitting balance - Comments: Pt with posterior and L lateral lean initially needing minA, cued pt to grab end of bed rail with R UE to improve balance with noted success and only CGA for safety Postural control: Posterior lean, Left lateral lean Standing balance support: Bilateral upper extremity supported, During functional activity Standing balance-Leahy Scale: Poor Standing balance comment: Reliant on bil UE support and modA to stand statically                             Pertinent  Vitals/Pain Pain Assessment Pain Assessment: Faces Faces Pain Scale: Hurts little more Pain Location: generalized grimacing with mobility and noxious stimuli Pain Descriptors / Indicators: Grimacing Pain Intervention(s): Limited activity within patient's tolerance, Monitored during session, Repositioned    Home Living Family/patient expects to be discharged to:: Unsure                   Additional Comments: pt not oriented and no family present to provide info    Prior Function Prior Level of Function : Patient poor historian/Family not available             Mobility Comments: suspect pt was independent but pt is a poor historian currently and no family present to confirm       Extremity/Trunk Assessment   Upper Extremity Assessment Upper Extremity Assessment: Defer to OT evaluation (symmetrical MMT scores of 4+ to 5 bil; denied numbness/tingling bil (unsure of accuracy of report though as pt started to perseverate on yes/no at times))    Lower Extremity Assessment Lower Extremity Assessment: RLE deficits/detail;LLE deficits/detail (symmetrical MMT scores of 4+ to 5 bil; denied numbness/tingling bil (unsure of accuracy of report though as pt started to perseverate on yes/no at times)) RLE  Deficits / Details: gross functional incoordination; symmetrical MMT scores of 4+ to 5 bil; reported some numbness/tingling bil, agreeing it was baseline neuropathy (unsure of accuracy of report though as pt started to perseverate on yes/no at times) RLE Coordination: decreased gross motor LLE Deficits / Details: gross functional incoordination; symmetrical MMT scores of 4+ to 5 bil; reported some numbness/tingling bil, agreeing it was baseline neuropathy (unsure of accuracy of report though as pt started to perseverate on yes/no at times) LLE Coordination: decreased gross motor    Cervical / Trunk Assessment Cervical / Trunk Assessment: Normal  Communication    Communication Communication: No apparent difficulties  Cognition Arousal: Lethargic Behavior During Therapy: Flat affect, Impulsive Overall Cognitive Status: Impaired/Different from baseline Area of Impairment: Orientation, Attention, Memory, Following commands, Safety/judgement, Problem solving, Awareness                 Orientation Level: Disoriented to, Time, Place, Situation Current Attention Level: Focused Memory: Decreased short-term memory, Decreased recall of precautions Following Commands: Follows one step commands inconsistently, Follows one step commands with increased time Safety/Judgement: Decreased awareness of safety, Decreased awareness of deficits Awareness: Intellectual Problem Solving: Slow processing, Decreased initiation, Difficulty sequencing, Requires verbal cues, Requires tactile cues General Comments: Pt lethargic with eyes closed in supine, impulsively trying to [place] sit at times. Once sitting EOB he opened his eyes but maintained a flat affect. Pt did not know where he was and stated "no" to "Are you at home?" and "Are you at a hospital?". Pt unaware of situation and believes it is "January 12". Follows simple cues with delayed processing ~75% of the time. perseveratse on yes/no at times        General Comments General comments (skin integrity, edema, etc.): VSS on RA; noted L eye more closed than R, unsure if due to edema from L crani or not, RN aware; upon return with blankets to cover pt at end of session noted pt moving R UE in a rhythmic pattern repeatedly, consisting of elbow flexion then rapid wrist ulnar and radial deviation then it would return to rest and repeat but pt able to respond verbally during these episodes (actually calmed down when he would get distracted) - RN reports this was noted earlier today and already made MD aware    Exercises     Assessment/Plan    PT Assessment Patient needs continued PT services  PT Problem List Decreased  activity tolerance;Decreased balance;Decreased mobility;Decreased coordination;Decreased knowledge of use of DME;Decreased cognition;Decreased safety awareness;Impaired sensation       PT Treatment Interventions DME instruction;Gait training;Stair training;Functional mobility training;Therapeutic activities;Therapeutic exercise;Neuromuscular re-education;Balance training;Cognitive remediation;Patient/family education    PT Goals (Current goals can be found in the Care Plan section)  Acute Rehab PT Goals Patient Stated Goal: to use the restroom PT Goal Formulation: With patient Time For Goal Achievement: 06/26/23 Potential to Achieve Goals: Good    Frequency Min 1X/week     Co-evaluation               AM-PAC PT "6 Clicks" Mobility  Outcome Measure Help needed turning from your back to your side while in a flat bed without using bedrails?: Total Help needed moving from lying on your back to sitting on the side of a flat bed without using bedrails?: A Lot Help needed moving to and from a bed to a chair (including a wheelchair)?: A Lot Help needed standing up from a chair using your arms (e.g., wheelchair or bedside chair)?: A Lot  Help needed to walk in hospital room?: Total Help needed climbing 3-5 steps with a railing? : Total 6 Click Score: 9    End of Session   Activity Tolerance: Patient limited by lethargy;Patient tolerated treatment well Patient left: in bed;with call bell/phone within reach;with bed alarm set;with restraints reapplied Nurse Communication: Mobility status;Other (comment) (L eye closed more, R UE rhythmic movement) PT Visit Diagnosis: Unsteadiness on feet (R26.81);Other abnormalities of gait and mobility (R26.89);Difficulty in walking, not elsewhere classified (R26.2);Other symptoms and signs involving the nervous system (R29.898)    Time: 5366-4403 PT Time Calculation (min) (ACUTE ONLY): 28 min   Charges:   PT Evaluation $PT Eval Moderate  Complexity: 1 Mod PT Treatments $Therapeutic Activity: 8-22 mins PT General Charges $$ ACUTE PT VISIT: 1 Visit         Virgil Benedict, PT, DPT Acute Rehabilitation Services  Office: [phone]   Bettina Gavia 06/12/2023, 11:02 AM

## 2023-06-12 NOTE — Progress Notes (Addendum)
E-link notified of patient's agitation as well as going into atrial fibrillation with RVR, which was confirmed by a 12 lead ECG. E-link MD did camera into room. RN told him patient was in a-fib RVR, and monitor seemed to be plainly visible to view for anyone who was looking into room via camera. MD notified by RN that diltiazem drip with an active order had been restarted.

## 2023-06-12 NOTE — Progress Notes (Signed)
E-link notified of 5 beat run of vtac.

## 2023-06-13 DIAGNOSIS — I671 Cerebral aneurysm, nonruptured: Secondary | ICD-10-CM | POA: Diagnosis not present

## 2023-06-13 LAB — BASIC METABOLIC PANEL
Anion gap: 13 (ref 5–15)
BUN: 28 mg/dL — ABNORMAL HIGH (ref 8–23)
CO2: 15 mmol/L — ABNORMAL LOW (ref 22–32)
Calcium: 9 mg/dL (ref 8.9–10.3)
Chloride: 114 mmol/L — ABNORMAL HIGH (ref 98–111)
Creatinine, Ser: 1.04 mg/dL (ref 0.61–1.24)
GFR, Estimated: >60 mL/min (ref >60)
Glucose, Bld: 108 mg/dL — ABNORMAL HIGH (ref 70–99)
Potassium: 3.4 mmol/L — ABNORMAL LOW (ref 3.5–5.1)
Sodium: 142 mmol/L (ref 135–145)

## 2023-06-13 LAB — GLUCOSE, CAPILLARY
Glucose-Capillary: 94 mg/dL (ref 70–99)
Glucose-Capillary: 90 mg/dL (ref 70–99)
Glucose-Capillary: 172 mg/dL — ABNORMAL HIGH (ref 70–99)
Glucose-Capillary: 120 mg/dL — ABNORMAL HIGH (ref 70–99)
Glucose-Capillary: 162 mg/dL — ABNORMAL HIGH (ref 70–99)

## 2023-06-13 LAB — CBC
HCT: 39.7 % (ref 39.0–52.0)
Hemoglobin: 13.5 g/dL (ref 13.0–17.0)
MCH: 32.8 pg (ref 26.0–34.0)
MCHC: 34 g/dL (ref 30.0–36.0)
MCV: 96.6 fL (ref 80.0–100.0)
Platelets: 228 10*3/uL (ref 150–400)
RBC: 4.11 MIL/uL — ABNORMAL LOW (ref 4.22–5.81)
RDW: 13.5 % (ref 11.5–15.5)
WBC: 10.7 10*3/uL — ABNORMAL HIGH (ref 4.0–10.5)
nRBC: 0 % (ref 0.0–0.2)

## 2023-06-13 LAB — PHOSPHORUS: Phosphorus: 4.7 mg/dL — ABNORMAL HIGH (ref 2.5–4.6)

## 2023-06-13 LAB — MAGNESIUM: Magnesium: 2.2 mg/dL (ref 1.7–2.4)

## 2023-06-13 MED ORDER — DILTIAZEM HCL 60 MG PO TABS
60.0000 mg | ORAL_TABLET | Freq: Three times a day (TID) | ORAL | Status: DC
  Administered 2023-06-13: 60 mg via ORAL
  Administered 2023-06-13: 30 mg via ORAL
  Administered 2023-06-14: 60 mg via ORAL
  Filled 2023-06-13 (×3): qty 1

## 2023-06-13 MED ORDER — INSULIN ASPART 100 UNIT/ML IJ SOLN
1.0000 [IU] | Freq: Three times a day (TID) | INTRAMUSCULAR | Status: DC
  Administered 2023-06-13: 2 [IU] via SUBCUTANEOUS
  Administered 2023-06-14 – 2023-06-16 (×4): 1 [IU] via SUBCUTANEOUS

## 2023-06-13 MED ORDER — SODIUM CHLORIDE 0.9 % IV SOLN: INTRAVENOUS | Status: AC | PRN

## 2023-06-13 MED ORDER — LACTATED RINGERS IV BOLUS
500.0000 mL | Freq: Once | INTRAVENOUS | Status: AC
  Administered 2023-06-13: 500 mL via INTRAVENOUS

## 2023-06-13 MED ORDER — POTASSIUM CHLORIDE 10 MEQ/100ML IV SOLN
10.0000 meq | INTRAVENOUS | Status: AC
  Administered 2023-06-13 (×4): 10 meq via INTRAVENOUS
  Filled 2023-06-13 (×4): qty 100

## 2023-06-13 MED ORDER — DIGOXIN 0.25 MG/ML IJ SOLN
0.5000 mg | Freq: Once | INTRAMUSCULAR | Status: AC
  Administered 2023-06-13: 0.5 mg via INTRAVENOUS
  Filled 2023-06-13: qty 2

## 2023-06-13 MED ORDER — ACETAMINOPHEN 325 MG PO TABS
650.0000 mg | ORAL_TABLET | Freq: Once | ORAL | Status: DC
  Filled 2023-06-13: qty 2

## 2023-06-13 NOTE — Progress Notes (Signed)
Placed patient on CPAP for the night via auto-mode with oxygen set at 2lpm

## 2023-06-13 NOTE — Progress Notes (Signed)
Subjective: Requiring sitter for agitation  Objective: Vital signs in last 24 hours: Temp:  [98.1 F (36.7 C)-99.2 F (37.3 C)] 99.2 F (37.3 C) (10/13 0800) Pulse Rate:  [65-179] 69 (10/13 0900) Resp:  [14-26] 17 (10/13 0900) BP: (88-167)/(37-134) 117/62 (10/13 0900) SpO2:  [93 %-97 %] 94 % (10/13 0900)  Intake/Output from previous day: 10/12 0701 - 10/13 0700 In: 668.6 [P.O.:240; I.V.:130.7; IV Piggyback:297.9] Out: 2350 [Urine:2350] Intake/Output this shift: Total I/O In: 102.9 [I.V.:10; IV Piggyback:92.9] Out: -   On precedex gtt Eyes open to stim, mumbling, MAEs x 4 Per nursing FC x 4 Incision c/d  Lab Results: Recent Labs    06/12/23 0608 06/13/23 0127  WBC 11.3* 10.7*  HGB 12.6* 13.5  HCT 38.4* 39.7  PLT 201 228   BMET Recent Labs    06/12/23 0608 06/13/23 0127  NA 143 142  K 3.1* 3.4*  CL 115* 114*  CO2 18* 15*  GLUCOSE 111* 108*  BUN 25* 28*  CREATININE 1.41* 1.04  CALCIUM 8.6* 9.0    Studies/Results: No results found.  Assessment/Plan: S/p Acomm aneurysm clipping - will order MRI brain without contrast to assess for ischemic disease, etc - cont supportive care   Brian [name] 06/13/2023, 9:14 AM

## 2023-06-13 NOTE — Progress Notes (Signed)
NAME:  Brian [name], MRN:  [id number], DOB:  [birth year], LOS: 5 ADMISSION DATE:  06/08/2023, CONSULTATION DATE:  06/08/2023 REFERRING MD: Conchita Paris - NSGY CHIEF COMPLAINT: S/p L frontotemporal craniotomy, Acomm clipping    History of Present Illness:  67 year old man who presented to [facility] 10/8 for planned management of cerebral aneurysm (detected incidentally on MRI Brain during seizure workup. PMHx significant for HTN, OSA (not on CPAP), tobacco abuse, T2DM (diet-controlled), GERD, anxiety/depression, seizure disorder (iniital seizure age 30, another ~1 month PTA), and CKD stage 3b. Patient underwent scheduled left frontotemporal craniotomy with anterior communicating artery aneurysm clipping 10/8 with NSGY (Dr. Conchita Paris).  PCCM consulted for postoperative management.  Pertinent Medical History:  HTN, OSA (not on CPAP), tobacco abuse, T2DM (diet-controlled), GERD, anxiety/depression, seizure disorder (iniital seizure age 89, another ~1 month PTA), and CKD stage 3b  Significant Hospital Events: Including procedures, antibiotic start and stop dates in addition to other pertinent events   10/8 - Underwent scheduled L frontotemporal craniotomy with Acomm artery aneurysm clipping 10/9 - CPAP PRN, mental status improved. Remains on Cardene gtt. 10/10 - Cardene gtt weaned off. A-line removed per NSGY. Confused in afternoon requiring sitter, mitts. Redirectable. 10/11 - Agitated overnight requiring Haldol, no significant response. Noted to be in Afib RVR with rates 150s. Diltiazem gtt started. Low-dose Precedex gtt for agitation. 10/12-off diltiazem, off Precedex, as needed Ativan  Interim History / Subjective:  Back into Afib/RVR overnight Diltiazem gtt restarted, down to 5 this AM Dilt 60mg  Q8H PO started with hope to wean from gtt Precedex at 0.4, remains confused, intermittently impulsive requiring sitter  MRI Brain per NSGY  Objective:  Blood pressure (!) 141/68, pulse 73, temperature  98.4 F (36.9 C), temperature source Axillary, resp. rate 15, height 6' (1.829 m), weight 90.7 kg, SpO2 94%.        Intake/Output Summary (Last 24 hours) at 06/13/2023 0659 Last data filed at 06/13/2023 0600 Gross per 24 hour  Intake 559.56 ml  Output 2350 ml  Net -1790.44 ml   Filed Weights   06/08/23 0804  Weight: 90.7 kg   Physical Examination: General: Acutely ill-appearing older man in NAD. HEENT: Normocephalic, s/p crani with L frontotemporal incision c/d/i with staple closure, scant dried blood. Anicteric sclera, PERRL 3mm, moist mucous membranes. Mild L periorbital swelling/brow droop. Neuro:  Drowsy, wakes easily to voice.  Responds to verbal stimuli. Following commands consistently. Moves all 4 extremities spontaneously. CV: RRR, III/VI systolic murmur heard best at LLSB. PULM: Breathing even and unlabored on RA. Lung fields CTAB. GI: Soft, nontender, nondistended. Normoactive bowel sounds. Extremities: No LE edema noted. Skin: Warm/dry, no rashes. Incision as above.  Resolved Hospital Problem List:    Assessment & Plan:   Left frontotemporal craniotomy Clipping of anterior communicating artery aneurysm 10/8 History of seizures - POD#5 from Acomm aneurysm clipping - Postoperative management per NSGY (primary) - MRI Brain today, 10/13 - Goal SBP < 160 - Pain management, limiting narcotic medications as able - Continue topiramate - Seizure precautions - Neuroprotective measures: HOB > 30 degrees, normoglycemia, normothermia, electrolytes WNL - PT/OT  Left periorbital edema Related to recent surgery. - Supportive care, continue to monitor  A-fib with RVR Recurrent Afib with RVR overnight 10/12PM-10/13AM. - Back in NSR after diltiazem gtt resumption, weaned down to 5 - Diltiazem 60mg  Q8H PO started to wean off of gtt - Optimize electrolyes (K > 4, Mg > 2) - Cardiac monitoring  Hypertension, hyperlipidemia - Continue losartan, hydralazine - Diltiazem PO  started 10/13, on amlodipine at home (this was held in the setting of dilt gtt) - IV labetalol/hydralazine PRN - Continue ASA/statin  Multifactorial delirium - Continue Seroquel 50mg  BID - Posey belt in place, limiting additional restraints as these are agitating to patient - Sitter present at bedside, as patient is redirectable - Maintain normal sleep/wake cycle as able - Delirium precautions in place  History of obstructive sleep apnea History of tobacco abuse - Supplemental O2 support as needed - Maintain SpO2 > 90% - Bronchodilators PRN - Does not use CPAP at home - Pulmonary hygiene - Nicotine patch  Type 2 diabetes - SSI - CBGs ACHS - Goal CBG 140-180  Chronic kidney disease stage IIIb - Trend BMP - Replete electrolytes as indicated - Monitor I&Os - Avoid nephrotoxic agents as able - Ensure adequate renal perfusion  GERD - PPI  BPH Urinary retention - Flomax - Foley for urinary retention, remove as able  History of anxiety/depression - Continue Duloxetine - Ativan PRN  Best Practice: (right click and "Reselect all SmartList Selections" daily)   Diet/type: Regular consistency (see orders) (ADAT per NSGY) DVT prophylaxis: SCDs GI prophylaxis: PPI Lines: N/A and Arterial Line Foley:  N/A Code Status:  full code Last date of multidisciplinary goals of care discussion [Per Primary Team]  Critical care time:   The patient is critically ill with multiple organ system failure and requires high complexity decision making for assessment and support, frequent evaluation and titration of therapies, advanced monitoring, review of radiographic studies and interpretation of complex data.   Critical Care Time devoted to patient care services, exclusive of separately billable procedures, described in this note is 36 minutes.  Tim Lair, PA-C [facility] 06/13/23 7:00 AM  Please see [email or web address] for pager details.  From 7A-7P if no  response, please call [phone] After hours, please call ELink [phone]

## 2023-06-13 NOTE — Progress Notes (Signed)
At 1100 after bath and morning med pass, patient became hypotensive, lowest SBP being 68/43 (51). After recheck, next reading 85/38 (52). Cloyd Stagers, PA-C, notified. Verbal order for bolus of LR. Blood pressures are stabilizing at this time. Voiced concern about futur anti-hypertensive doses for 1200. Judeth Cornfield and Antonieta Loveless, Pharmacist, all in agreeance to hold next doses at this time.   Mammie Russian, RN

## 2023-06-13 NOTE — Progress Notes (Addendum)
eLink Physician-Brief Progress Note Patient Name: WEBER MONNIER DOB: 03-Nov-1955 MRN: [id number]   Date of Service  06/13/2023  HPI/Events of Note  C/O generalized stomach pain, able to take PO's , per pt has H/O Ulcers        Pt is due for cardizem 60mg  and Hydralazine but earlier today pt  received fluid bolus for hypotension,   currently 136/72(88)  HR 68, Need quidance/paramters for scheduled me  Camera:  Discussed with RN. His VS stable. Not in distress. Abdomen is soft,, on protonix. Oxycodone makes confusion per family. On precedex drip.   A: Non specific abdominal pain. Watch for now, tylenol once. Hypotension resolved post bolus. Hold Hydralazine for now, ok for Cardizem . Dip in BP earlier could be neuronal-autonomic dysfunction , s/p aneurysmal clipping.   eICU Interventions  Tylenol ordered for now. Hg > 13. No bleeding signs.( Allergy to tylenol, not specified) Keep MAP > 65.  Hold hydralazine 50 mg for tonight. Cardizem - to give 30 mg instead of 60 mg scheduled for tonight.  If BP drops or elevated to call back.      Intervention Category Intermediate Interventions: Pain - evaluation and management Minor Interventions: Routine modifications to care plan (e.g. PRN medications for pain, fever)  Ranee Gosselin 06/13/2023, 8:22 PM  05:19 AM Due for Cardizem and hydralazine this am   currently HR 69   125/70    got down to 50's with HR via night,   RN just needs to know if you want to give both this AM or adjust dosage.  Hold both for AM for now.

## 2023-06-13 NOTE — Progress Notes (Signed)
eLink Physician-Brief Progress Note Patient Name: FAIRLEY COPHER DOB: Jan 06, 1956 MRN: [id number]   Date of Service  06/13/2023  HPI/Events of Note  Pending A-fib with RVR.  Maxed out on diltiazem.  Heart rate is still in the 1 15-1 20s range with blood pressure 96/46 and a mean of 61.  eICU Interventions  12 digoxin ordered.  Attempting to reach bedside RN to update him.     Intervention Category Major Interventions: Arrhythmia - evaluation and management  Carilyn Goodpasture 06/13/2023, 1:45 AM

## 2023-06-13 NOTE — Progress Notes (Signed)
E-link notified again of hypotension and that patient's heart rate is not sustaining in goal range of 65-105.

## 2023-06-13 NOTE — Progress Notes (Addendum)
E-link notified that diltiazem infusion is at max dose, and heart rate is not within goal of 65-105. E-link also notified of 0015 blood pressure of 88/58.

## 2023-06-13 NOTE — Progress Notes (Signed)
[facility] ADULT ICU REPLACEMENT PROTOCOL   The patient does apply for the [facility] Adult ICU Electrolyte Replacment Protocol based on the criteria listed below:   1.Exclusion criteria: TCTS, ECMO, Dialysis, and Myasthenia Gravis patients 2. Is GFR >/= 30 ml/min? Yes.    Patient's GFR today is >60 3. Is SCr </= 2? Yes.   Patient's SCr is 1.04 mg/dL 4. Did SCr increase >/= 0.5 in 24 hours? No. 5.Pt's weight >40kg  Yes.   6. Abnormal electrolyte(s): K+3.4  7. Electrolytes replaced per protocol 8.  Call MD STAT for K+ </= 2.5, Phos </= 1, or Mag </= 1 Physician:  Dr. Wallie Char, Lilia Argue 06/13/2023 2:59 AM

## 2023-06-14 ENCOUNTER — Encounter: Payer: Medicare HMO | Attending: Physical Medicine & Rehabilitation | Admitting: Physical Medicine & Rehabilitation

## 2023-06-14 DIAGNOSIS — E119 Type 2 diabetes mellitus without complications: Secondary | ICD-10-CM

## 2023-06-14 DIAGNOSIS — I671 Cerebral aneurysm, nonruptured: Secondary | ICD-10-CM | POA: Diagnosis not present

## 2023-06-14 LAB — GLUCOSE, CAPILLARY
Glucose-Capillary: 102 mg/dL — ABNORMAL HIGH (ref 70–99)
Glucose-Capillary: 116 mg/dL — ABNORMAL HIGH (ref 70–99)
Glucose-Capillary: 134 mg/dL — ABNORMAL HIGH (ref 70–99)
Glucose-Capillary: 114 mg/dL — ABNORMAL HIGH (ref 70–99)
Glucose-Capillary: 114 mg/dL — ABNORMAL HIGH (ref 70–99)

## 2023-06-14 LAB — BASIC METABOLIC PANEL
Anion gap: 10 (ref 5–15)
BUN: 25 mg/dL — ABNORMAL HIGH (ref 8–23)
CO2: 19 mmol/L — ABNORMAL LOW (ref 22–32)
Calcium: 9 mg/dL (ref 8.9–10.3)
Chloride: 116 mmol/L — ABNORMAL HIGH (ref 98–111)
Creatinine, Ser: 1.13 mg/dL (ref 0.61–1.24)
GFR, Estimated: >60 mL/min (ref >60)
Glucose, Bld: 100 mg/dL — ABNORMAL HIGH (ref 70–99)
Potassium: 3.6 mmol/L (ref 3.5–5.1)
Sodium: 145 mmol/L (ref 135–145)

## 2023-06-14 LAB — CBC
HCT: 39.9 % (ref 39.0–52.0)
Hemoglobin: 13.3 g/dL (ref 13.0–17.0)
MCH: 32 pg (ref 26.0–34.0)
MCHC: 33.3 g/dL (ref 30.0–36.0)
MCV: 96.1 fL (ref 80.0–100.0)
Platelets: 261 10*3/uL (ref 150–400)
RBC: 4.15 MIL/uL — ABNORMAL LOW (ref 4.22–5.81)
RDW: 13.3 % (ref 11.5–15.5)
WBC: 10.2 10*3/uL (ref 4.0–10.5)
nRBC: 0 % (ref 0.0–0.2)

## 2023-06-14 LAB — MAGNESIUM: Magnesium: 2 mg/dL (ref 1.7–2.4)

## 2023-06-14 MED ORDER — METOPROLOL TARTRATE 5 MG/5ML IV SOLN: 5.0000 mg | Freq: Four times a day (QID) | INTRAVENOUS | Status: DC | PRN

## 2023-06-14 MED ORDER — AMLODIPINE BESYLATE 5 MG PO TABS
5.0000 mg | ORAL_TABLET | Freq: Every day | ORAL | Status: DC
  Administered 2023-06-14 – 2023-07-05 (×21): 5 mg via ORAL
  Filled 2023-06-14 (×22): qty 1

## 2023-06-14 MED ORDER — HALOPERIDOL LACTATE 5 MG/ML IJ SOLN
5.0000 mg | Freq: Four times a day (QID) | INTRAMUSCULAR | Status: DC | PRN
  Administered 2023-06-16 – 2023-06-17 (×3): 5 mg via INTRAVENOUS
  Filled 2023-06-14 (×4): qty 1

## 2023-06-14 MED ORDER — HALOPERIDOL LACTATE 5 MG/ML IJ SOLN: 5.0000 mg | Freq: Once | INTRAMUSCULAR | Status: DC

## 2023-06-14 MED ORDER — HALOPERIDOL LACTATE 5 MG/ML IJ SOLN
5.0000 mg | Freq: Once | INTRAMUSCULAR | Status: AC
  Administered 2023-06-14: 5 mg via INTRAVENOUS
  Filled 2023-06-14: qty 1

## 2023-06-14 NOTE — Plan of Care (Signed)
  Problem: Cardiovascular: Goal: Ability to achieve and maintain adequate cardiovascular perfusion will improve Outcome: Progressing   Problem: Clinical Measurements: Goal: Usual level of consciousness will be regained or maintained. Outcome: Progressing   Problem: Safety: Goal: Non-violent Restraint(s) Outcome: Progressing

## 2023-06-14 NOTE — Progress Notes (Signed)
E-link notified of 10-beat V-tac run. Orders to hold Cardizem. E-link RN notified that 0500 Cardizem dose had already been administered. AM hydralazine held. Will continue to monitor. Pt has remained A&Ox1 throughout shift with intermittent episodes of agitation/restlessness. Pt is currently restless and frequently attempting to remove care equipment and scratch/pick at craniotomy incision but is cooperative and redirectable otherwise.

## 2023-06-14 NOTE — Progress Notes (Signed)
Cpap on on hold due to pt. Having to wear restraints.

## 2023-06-14 NOTE — Progress Notes (Signed)
NAME:  Brian [name], MRN:  [id number], DOB:  December 29, 1955, LOS: 6 ADMISSION DATE:  06/08/2023, CONSULTATION DATE:  06/08/2023 REFERRING MD: Conchita Paris - NSGY CHIEF COMPLAINT: S/p L frontotemporal craniotomy, Acomm clipping    History of Present Illness:  67 year old man who presented to [facility] 10/8 for planned management of cerebral aneurysm (detected incidentally on MRI Brain during seizure workup. PMHx significant for HTN, OSA (not on CPAP), tobacco abuse, T2DM (diet-controlled), GERD, anxiety/depression, seizure disorder (iniital seizure age 82, another ~1 month PTA), and CKD stage 3b. Patient underwent scheduled left frontotemporal craniotomy with anterior communicating artery aneurysm clipping 10/8 with NSGY (Dr. Conchita Paris).  PCCM consulted for postoperative management.  Pertinent Medical History:  HTN, OSA (not on CPAP), tobacco abuse, T2DM (diet-controlled), GERD, anxiety/depression, seizure disorder (iniital seizure age 60, another ~1 month PTA), and CKD stage 3b  Significant Hospital Events: Including procedures, antibiotic start and stop dates in addition to other pertinent events   10/8 - Underwent scheduled L frontotemporal craniotomy with Acomm artery aneurysm clipping 10/9 - CPAP PRN, mental status improved. Remains on Cardene gtt. 10/10 - Cardene gtt weaned off. A-line removed per NSGY. Confused in afternoon requiring sitter, mitts. Redirectable. 10/11 - Agitated overnight requiring Haldol, no significant response. Noted to be in Afib RVR with rates 150s. Diltiazem gtt started. Low-dose Precedex gtt for agitation. 10/12-off diltiazem, off Precedex, as needed Ativan  Interim History / Subjective:  No significant events overnight Intermittently agitated/delirious, but remains redirectable and cooperative 10-beat run of VT overnight, none since [place] and conversant this AM Wants to get OOB Fed himself breakfast - tolerating PO  Objective:  Blood pressure 125/70, pulse 78,  temperature (!) 97.4 F (36.3 C), temperature source Axillary, resp. rate 16, height 6' (1.829 m), weight 90.7 kg, SpO2 (!) 84%.        Intake/Output Summary (Last 24 hours) at 06/14/2023 0758 Last data filed at 06/14/2023 4696 Gross per 24 hour  Intake 870.58 ml  Output 1060 ml  Net -189.42 ml   Filed Weights   06/08/23 0804  Weight: 90.7 kg   Physical Examination: General: Acutely ill-appearing older man in NAD. HEENT: Normocephalic, L frontotemporal crani incision clean/dry/intact, scant dried blood, staple closure; anicteric sclera, PERRL, moist mucous membranes. Neuro: Awake, oriented x 2-3. Responds to verbal stimuli. Following commands consistently. Moves all 4 extremities spontaneously. Strength 5/5 in all 4 extremities.  CV: RRR, III/VI systolic murmur. PULM: Breathing even and unlabored on RA. Lung fields CTAB. GI: Soft, nontender, nondistended. Normoactive bowel sounds. Extremities: No LE edema noted. Skin: Warm/dry, no rashes. Incision as above.  Resolved Hospital Problem List:    Assessment & Plan:   Left frontotemporal craniotomy Clipping of anterior communicating artery aneurysm 10/8 History of seizures - POD#6 from Acomm aneurysm clipping - Postoperative management per NSGY (primary) - MRI Brain pending - Goal SBP < 160 - Pain management with APAP, limiting narcotic medications as able - Continue topiramate - Seizure precautions - Neuroprotective measures: HOB > 30 degrees, normoglycemia, normothermia, electrolytes WNL - PT/OT  Left periorbital edema Related to recent surgery. - Supportive care, continue to monitor  A-fib with RVR, resolved Recurrent Afib with RVR overnight 10/12PM-10/13AM. - Remains in NSR - Continue Diltiazem 60mg  Q8H PO - Optimize electrolyes (K > 4, Mg > 2) - Cardiac monitoring  Hypertension, hyperlipidemia - Continue losartan, hydralazine, diltiazem - Home amlodipine held in the setting of diltiazem initiation - IV  labetalol/hydralazine PRN - ASA/statin  Multifactorial delirium - Continue Seroquel 50mg  BID -  Posey belt in place, limiting additional restraints/tethers as able - Sitting as available - Maintain normal sleep/wake cycles as able - Delirium precautions in place  History of obstructive sleep apnea History of tobacco abuse - No longer requiring supplemental O2 support - Bronchodilators PRN - Does not use CPAP at home - Pulmonary hygiene - Nicotine patch, encourage cessation  Type 2 diabetes - SSI - CBGs ACHS - Goal CBG 140-180  Chronic kidney disease stage IIIb - Trend BMP - Replete electrolytes as indicated - Monitor I&Os - Avoid nephrotoxic agents as able - Ensure adequate renal perfusion  GERD - PPI  BPH Urinary retention - Flomax - Foley for urinary retention, remove as able  History of anxiety/depression - Duloxetine - Ativan PRN - Off of Precedex  Best Practice: (right click and "Reselect all SmartList Selections" daily)   Diet/type: Regular consistency (see orders) (ADAT per NSGY) DVT prophylaxis: SCDs GI prophylaxis: PPI Lines: N/A and Arterial Line Foley:  N/A Code Status:  full code Last date of multidisciplinary goals of care discussion [Per Primary Team]  Critical care time:   The patient is critically ill with multiple organ system failure and requires high complexity decision making for assessment and support, frequent evaluation and titration of therapies, advanced monitoring, review of radiographic studies and interpretation of complex data.   Critical Care Time devoted to patient care services, exclusive of separately billable procedures, described in this note is 32 minutes.  Tim Lair, PA-C [facility] 06/14/23 7:58 AM  Please see [email or web address] for pager details.  From 7A-7P if no response, please call [phone] After hours, please call ELink [phone]

## 2023-06-14 NOTE — Progress Notes (Signed)
Pt agitated, attempting to rip off IVs, monitoring equipment, etc, after Ativan given by night shift. Obtained order for addition of soft wrist restraints from MD. Will attempt to keep off precedex.

## 2023-06-14 NOTE — Progress Notes (Signed)
Occupational Therapy Treatment Patient Details Name: Brian [name] MRN: [id number] DOB: 03/20/1956 Today's Date: 06/14/2023   History of present illness Pt is a 67 y.o. male who presented 06/08/23 for left frontotemporal craniotomy for clipping of anterior communicating artery aneurysm. Hospital course complicated by pt agitation and afib with RVR. CT head 10/9 showed mild to moderate bifrontal pneumocephalus and small volume of extra-axial hemorrhage with mild mass effect on the frontal lobes without midline shift. PMH: OSA (not using the CPAP), HTN, diet controlled DM-2, tobacco smoking for 52 yrs, GERD, anxiety, depression, CVA, seizure disorder, and CKD-3b   OT comments  Pt with better sustained attention and verbal conversation this session compared to previous.  VSS throughout with pt still demonstrating increased confusion with no awareness of place, time, or situation.  He was able to follow one step commands consistently with increased time.  Bed mobility and stand pivot transfer to the recliner were completed at mod assist level.  Total assist was needed prior to transfer secondary to bowel urgency in bed.  Feel pt is making steady progress.  Recommend continued acute OT at this time with transition to inpatient follow up therapy, >3 hours/day in preparation for discharge home.         If plan is discharge home, recommend the following:  Direct supervision/assist for medications management;Help with stairs or ramp for entrance;Direct supervision/assist for financial management;Assistance with feeding;Assistance with cooking/housework;A lot of help with bathing/dressing/bathroom;A lot of help with walking and/or transfers;Assist for transportation   Equipment Recommendations  Other (comment) (TBD next venue of care or next visit if discharging home)       Precautions / Restrictions Precautions Precautions: Fall;Other (comment) Precaution Comments: posey belt and wrist  restraints Restrictions Weight Bearing Restrictions: No       Mobility Bed Mobility Overal bed mobility: Needs Assistance Bed Mobility: Rolling Rolling: Mod assist   Supine to sit: Mod assist          Transfers Overall transfer level: Needs assistance Equipment used: 1 person hand held assist Transfers: Sit to/from Stand, Bed to chair/wheelchair/BSC Sit to Stand: Mod assist     Step pivot transfers: Mod assist     General transfer comment: Mod assist for pivot to the recliner while holding onto therapist.     Balance Overall balance assessment: Needs assistance Sitting-balance support: Single extremity supported, No upper extremity supported, Feet supported Sitting balance-Leahy Scale: Fair Sitting balance - Comments: Pt able to sit statically with supervision  EOB unsupported.   Standing balance support: Bilateral upper extremity supported, During functional activity Standing balance-Leahy Scale: Poor Standing balance comment: Mod assist for standing balance during transfer                           ADL either performed or assessed with clinical judgement   ADL Overall ADL's : Needs assistance/impaired     Grooming: Supervision/safety;Sitting Grooming Details (indicate cue type and reason): EOB simulated                 Toilet Transfer: Moderate assistance;Stand-pivot Toilet Transfer Details (indicate cue type and reason): simulated to recliner Toileting- Clothing Manipulation and Hygiene: Total assistance;Bed level Toileting - Clothing Manipulation Details (indicate cue type and reason): secondary to bowel urgency in the bed     Functional mobility during ADLs: Moderate assistance (stand pivot transfer to the recliner) General ADL Comments: Pt with improved sustained attention this session.  Able to follow simple one  step commands related to bed mobility.  Slight agitation with wanting me to "cut his wrist restraints" so he could go to the  bathroom.  Re-direction needed that he would have to wait for me to get bedside commode and lines setup for movement.  Unfortunately, he was unable to make it and BM cleanup was completed at bed level.  Mod assist for supine to sit with close supervision for sitting balance EOB.  Vitals stable with HR in the low 80s and BP at 148/76.  Oxygen sats at 100% on room air.      Cognition Arousal: Alert Behavior During Therapy: Agitated, Flat affect (Increased agitation initially needing to get untied and to the toilet.  This improved and dissapated after a few mins) Overall Cognitive Status: Impaired/Different from baseline Area of Impairment: Orientation, Attention, Problem solving, Awareness, Memory, Following commands, Safety/judgement                 Orientation Level: Disoriented to, Place, Time, Situation Current Attention Level: Sustained Memory: Decreased short-term memory Following Commands: Follows one step commands with increased time Safety/Judgement: Decreased awareness of safety, Decreased awareness of deficits Awareness: Intellectual Problem Solving: Slow processing, Difficulty sequencing, Requires verbal cues, Requires tactile cues General Comments: Pt kept eyes open 75% of session and engaged in conversation with therapist when questions were asked.  Still with increased confusion stating he was in "[place]" and at a "barn".                   Pertinent Vitals/ Pain       Pain Assessment Pain Assessment: Faces Faces Pain Scale: Hurts a little bit Pain Location: right chest abdomen Pain Descriptors / Indicators: Grimacing, Discomfort Pain Intervention(s): Limited activity within patient's tolerance, Monitored during session, Repositioned         Frequency  Min 1X/week        Progress Toward Goals  OT Goals(current goals can now be found in the care plan section)  Progress towards OT goals: Progressing toward goals  Acute Rehab OT Goals Patient Stated  Goal: Pt did not state but agreeable to OOB OT Goal Formulation: Patient unable to participate in goal setting Time For Goal Achievement: 06/26/23 Potential to Achieve Goals: Fair  Plan         AM-PAC OT "6 Clicks" Daily Activity     Outcome Measure   Help from another person eating meals?: A Little Help from another person taking care of personal grooming?: A Little Help from another person toileting, which includes using toliet, bedpan, or urinal?: A Lot Help from another person bathing (including washing, rinsing, drying)?: A Lot Help from another person to put on and taking off regular upper body clothing?: A Lot Help from another person to put on and taking off regular lower body clothing?: A Lot 6 Click Score: 14    End of Session    OT Visit Diagnosis: Unsteadiness on feet (R26.81);Other abnormalities of gait and mobility (R26.89);Muscle weakness (generalized) (M62.81);Other symptoms and signs involving cognitive function;Pain Pain - Right/Left: Right (abdomen/chest)   Activity Tolerance Patient tolerated treatment well   Patient Left in chair;with call bell/phone within reach;with chair alarm set   Nurse Communication Mobility status        Time: 1610-9604 OT Time Calculation (min): 41 min  Charges: OT General Charges $OT Visit: 1 Visit OT Treatments $Self Care/Home Management : 38-52 mins  Perrin Maltese, OTR/L Acute Rehabilitation Services  Office [phone] 06/14/2023

## 2023-06-14 NOTE — Progress Notes (Signed)
This RN attempted to verbally de-escalate, and call family members, to prevent patient from being verbally and physically aggressive. Restraints reinforced. Administered PRN ativan with no relief. Notified CCM, Dr. Celine Mans and Cloyd Stagers. Verbal order for haldol, and agreeance that the benefit outweighs the risk to administer 5mg  of Haldol, despite QTc being 0.57 (570). Patients vitals have been stable, and agitation and confusion has been addressed appropriately. Current QTc is 461, with vitals within normal limits. See Flowsheets.  Mammie Russian, RN

## 2023-06-14 NOTE — Progress Notes (Signed)
  NEUROSURGERY PROGRESS NOTE   no issues overnight. Patient did require a dose of Ativan for mild agitation. Patient reports he doesn't feel great, but better than the last few days.  EXAM:  BP (!) 162/92   Pulse 83   Temp 98.4 F (36.9 C) (Axillary)   Resp (!) 21   Ht 6' (1.829 m)   Wt 90.7 kg   SpO2 100%   BMI 27.12 kg/m   awake, alert, oriented to place and person CN grossly intact Speech fluent MAE good strength wound c/d/i  IMPRESSION:  67 y.o. male  POD# 6 s/p elective Acom clipping. Overall confusion and lethargy appears to be improving - Afib with RVR, controlled  PLAN: - Cont PT/OT - If agitation remains well controlled can transfer to stepdown tomorrow - Appreciate PCCM assistance    Lisbeth Renshaw, MD [facility]

## 2023-06-14 NOTE — Progress Notes (Signed)
Inpatient Rehab Admissions Coordinator:  ? ?Per therapy recommendations,  patient was screened for CIR candidacy by Devaney Segers, MS, CCC-SLP. At this time, Pt. Appears to be a a potential candidate for CIR. I will place   order for rehab consult per protocol for full assessment. Please contact me any with questions. ? ?Trine Fread, MS, CCC-SLP ?Rehab Admissions Coordinator  ?[phone] (celll) ?[phone] (office) ? ?

## 2023-06-14 NOTE — Progress Notes (Signed)
Verbal order from Dr. Conchita Paris to hold MRI for now. Both this RN and Dr. Conchita Paris expressed concerns about agitation and safety, along with the patients ability to tolerate being on the MRI table. Will inform MRI to hold order, and readdress when cognition begins to improve.  Mammie Russian, RN

## 2023-06-15 ENCOUNTER — Inpatient Hospital Stay (HOSPITAL_COMMUNITY): Payer: Medicare HMO

## 2023-06-15 DIAGNOSIS — E119 Type 2 diabetes mellitus without complications: Secondary | ICD-10-CM | POA: Diagnosis not present

## 2023-06-15 DIAGNOSIS — I671 Cerebral aneurysm, nonruptured: Secondary | ICD-10-CM | POA: Diagnosis not present

## 2023-06-15 LAB — CBC
HCT: 39.1 % (ref 39.0–52.0)
Hemoglobin: 13.4 g/dL (ref 13.0–17.0)
MCH: 32.6 pg (ref 26.0–34.0)
MCHC: 34.3 g/dL (ref 30.0–36.0)
MCV: 95.1 fL (ref 80.0–100.0)
Platelets: 274 10*3/uL (ref 150–400)
RBC: 4.11 MIL/uL — ABNORMAL LOW (ref 4.22–5.81)
RDW: 13.3 % (ref 11.5–15.5)
WBC: 12.8 10*3/uL — ABNORMAL HIGH (ref 4.0–10.5)
nRBC: 0 % (ref 0.0–0.2)

## 2023-06-15 LAB — GLUCOSE, CAPILLARY
Glucose-Capillary: 129 mg/dL — ABNORMAL HIGH (ref 70–99)
Glucose-Capillary: 85 mg/dL (ref 70–99)
Glucose-Capillary: 98 mg/dL (ref 70–99)
Glucose-Capillary: 146 mg/dL — ABNORMAL HIGH (ref 70–99)
Glucose-Capillary: 115 mg/dL — ABNORMAL HIGH (ref 70–99)
Glucose-Capillary: 177 mg/dL — ABNORMAL HIGH (ref 70–99)

## 2023-06-15 LAB — BASIC METABOLIC PANEL
Anion gap: 15 (ref 5–15)
BUN: 23 mg/dL (ref 8–23)
CO2: 18 mmol/L — ABNORMAL LOW (ref 22–32)
Calcium: 9.7 mg/dL (ref 8.9–10.3)
Chloride: 112 mmol/L — ABNORMAL HIGH (ref 98–111)
Creatinine, Ser: 1.21 mg/dL (ref 0.61–1.24)
GFR, Estimated: >60 mL/min (ref >60)
Glucose, Bld: 108 mg/dL — ABNORMAL HIGH (ref 70–99)
Potassium: 3.4 mmol/L — ABNORMAL LOW (ref 3.5–5.1)
Sodium: 145 mmol/L (ref 135–145)

## 2023-06-15 MED ORDER — SODIUM CHLORIDE 0.9 % IV SOLN: 12.5000 mg | Freq: Four times a day (QID) | INTRAVENOUS | Status: DC | PRN

## 2023-06-15 MED ORDER — POTASSIUM CHLORIDE CRYS ER 20 MEQ PO TBCR
40.0000 meq | EXTENDED_RELEASE_TABLET | Freq: Once | ORAL | Status: AC
  Administered 2023-06-15: 40 meq via ORAL
  Filled 2023-06-15: qty 2

## 2023-06-15 MED ORDER — ASPIRIN 81 MG PO CHEW
81.0000 mg | CHEWABLE_TABLET | Freq: Every day | ORAL | Status: DC
  Filled 2023-06-15: qty 1

## 2023-06-15 MED ORDER — ASPIRIN 81 MG PO CHEW
81.0000 mg | CHEWABLE_TABLET | Freq: Every day | ORAL | Status: DC
  Administered 2023-06-15 – 2023-07-01 (×17): 81 mg via ORAL
  Filled 2023-06-15 (×16): qty 1

## 2023-06-15 NOTE — Progress Notes (Signed)
Physical Therapy Treatment Patient Details Name: Brian [name] MRN: [id number] DOB: 31-Dec-1955 Today's Date: 06/15/2023   History of Present Illness Pt is a 67 y.o. male who presented 06/08/23 for left frontotemporal craniotomy for clipping of anterior communicating artery aneurysm. Hospital course complicated by pt agitation and afib with RVR. CT head 10/9 showed mild to moderate bifrontal pneumocephalus and small volume of extra-axial hemorrhage with mild mass effect on the frontal lobes without midline shift. PMH: OSA (not using the CPAP), HTN, diet controlled DM-2, tobacco smoking for 52 yrs, GERD, anxiety, depression, CVA, seizure disorder, and CKD-3b    PT Comments  Patient progressing some with mobility able to transfer to recliner and march in place despite LTM EEG wires on.  Still frustrated with wires and RN in to assist to tie down hands end of session to prevent pt removing electrodes.  Feel he could tolerate intensive inpatient rehab if has capable 24 hour assist available at d/c.  PT will continue to follow.     If plan is discharge home, recommend the following: A lot of help with walking and/or transfers;A lot of help with bathing/dressing/bathroom;Assistance with cooking/housework;Direct supervision/assist for financial management;Direct supervision/assist for medications management;Assist for transportation;Help with stairs or ramp for entrance;Supervision due to cognitive status   Can travel by private vehicle        Equipment Recommendations  Other (comment) (TBA)    Recommendations for Other Services       Precautions / Restrictions Precautions Precautions: Fall Precaution Comments: posey belt and wrist restraints; LTM EEG     Mobility  Bed Mobility Overal bed mobility: Needs Assistance Bed Mobility: Supine to Sit, Sit to Supine     Supine to sit: Min assist Sit to supine: Min assist   General bed mobility comments: up to sitting with A for lines and safety,  but no real physical help; to supine assist for lines and positioning    Transfers Overall transfer level: Needs assistance Equipment used: 1 person hand held assist Transfers: Sit to/from Stand Sit to Stand: Mod assist   Step pivot transfers: Mod assist       General transfer comment: assist for balance and lines and safety, transfer to recliner due to bed needing change with periwick leaking    Ambulation/Gait                   Stairs             Wheelchair Mobility     Tilt Bed    Modified Rankin (Stroke Patients Only) Modified Rankin (Stroke Patients Only) Pre-Morbid Rankin Score: No symptoms Modified Rankin: Moderately severe disability     Balance Overall balance assessment: Needs assistance Sitting-balance support: Feet supported Sitting balance-Leahy Scale: Fair     Standing balance support: Single extremity supported Standing balance-Leahy Scale: Poor Standing balance comment: cues to keep HHA for balance; pt stretching some in standing at bedside and marching in place with c/o back stiffness                            Cognition Arousal: Alert Behavior During Therapy: Impulsive Overall Cognitive Status: Impaired/Different from baseline Area of Impairment: Orientation, Attention, Problem solving, Awareness, Memory, Following commands, Safety/judgement                 Orientation Level: Disoriented to, Place, Time, Situation Current Attention Level: Sustained Memory: Decreased short-term memory Following Commands: Follows one step commands with  increased time Safety/Judgement: Decreased awareness of safety, Decreased awareness of deficits   Problem Solving: Slow processing, Difficulty sequencing, Requires verbal cues, Requires tactile cues General Comments: started pulling off EEG lines after up to chair for bed change and RN had to tie hands back down, had been doing well earlier in the day        Exercises       General Comments        Pertinent Vitals/Pain Pain Assessment Pain Assessment: Faces Faces Pain Scale: Hurts little more Pain Location: head Pain Descriptors / Indicators: Headache Pain Intervention(s): Monitored during session, Repositioned    Home Living                          Prior Function            PT Goals (current goals can now be found in the care plan section) Progress towards PT goals: Progressing toward goals    Frequency    Min 1X/week      PT Plan      Co-evaluation              AM-PAC PT "6 Clicks" Mobility   Outcome Measure  Help needed turning from your back to your side while in a flat bed without using bedrails?: A Little Help needed moving from lying on your back to sitting on the side of a flat bed without using bedrails?: A Little Help needed moving to and from a bed to a chair (including a wheelchair)?: A Lot Help needed standing up from a chair using your arms (e.g., wheelchair or bedside chair)?: A Lot Help needed to walk in hospital room?: Total Help needed climbing 3-5 steps with a railing? : Total 6 Click Score: 12    End of Session   Activity Tolerance: Patient tolerated treatment well Patient left: in bed;with call bell/phone within reach;with bed alarm set;with restraints reapplied   PT Visit Diagnosis: Unsteadiness on feet (R26.81);Other abnormalities of gait and mobility (R26.89);Difficulty in walking, not elsewhere classified (R26.2);Other symptoms and signs involving the nervous system (R29.898)     Time: 2595-6387 PT Time Calculation (min) (ACUTE ONLY): 31 min  Charges:    $Therapeutic Activity: 23-37 mins PT General Charges $$ ACUTE PT VISIT: 1 Visit                     Sheran Lawless, PT Acute Rehabilitation Services Office:[phone] 06/15/2023    Brian [name] 06/15/2023, 5:28 PM

## 2023-06-15 NOTE — Progress Notes (Signed)
   06/15/23 2320  BiPAP/CPAP/SIPAP  Reason BIPAP/CPAP not in use Non-compliant;Other(comment) (doesn't use)

## 2023-06-15 NOTE — Progress Notes (Signed)
NAME:  Brian [name], MRN:  [id number], DOB:  1956-03-13, LOS: 7 ADMISSION DATE:  06/08/2023, CONSULTATION DATE:  06/08/2023 REFERRING MD: Conchita Paris - NSGY CHIEF COMPLAINT: S/p L frontotemporal craniotomy, Acomm clipping    History of Present Illness:  67 year old man who presented to [facility] 10/8 for planned management of cerebral aneurysm (detected incidentally on MRI Brain during seizure workup. PMHx significant for HTN, OSA (not on CPAP), tobacco abuse, T2DM (diet-controlled), GERD, anxiety/depression, seizure disorder (iniital seizure age 89, another ~1 month PTA), and CKD stage 3b. Patient underwent scheduled left frontotemporal craniotomy with anterior communicating artery aneurysm clipping 10/8 with NSGY (Dr. Conchita Paris).  PCCM consulted for postoperative management.  Pertinent Medical History:  HTN, OSA (not on CPAP), tobacco abuse, T2DM (diet-controlled), GERD, anxiety/depression, seizure disorder (iniital seizure age 89, another ~1 month PTA), and CKD stage 3b  Significant Hospital Events: Including procedures, antibiotic start and stop dates in addition to other pertinent events   10/8 - Underwent scheduled L frontotemporal craniotomy with Acomm artery aneurysm clipping 10/9 - CPAP PRN, mental status improved. Remains on Cardene gtt. 10/10 - Cardene gtt weaned off. A-line removed per NSGY. Confused in afternoon requiring sitter, mitts. Redirectable. 10/11 - Agitated overnight requiring Haldol, no significant response. Noted to be in Afib RVR with rates 150s. Diltiazem gtt started. Low-dose Precedex gtt for agitation. 10/12 - Off diltiazem, off Precedex, as needed Ativan. 10/14 - Mental status slowly improving, still with intermittent agitation/impulsivity. Haldol added PRN.  Interim History / Subjective:  No significant events overnight Late in day 10/14, intermittently agitated requiring Haldol administration QTc 570, benefit of administration outweighed risk; realtime QTc monitor  started by RN and WNL Pleasant and answering questions appropriately this AM Mild confusion, thinks he is in [place], [place]  Objective:  Blood pressure (!) 146/68, pulse 75, temperature 99.6 F (37.6 C), temperature source Axillary, resp. rate 16, height 6' (1.829 m), weight 90.7 kg, SpO2 92%.        Intake/Output Summary (Last 24 hours) at 06/15/2023 0753 Last data filed at 06/15/2023 0600 Gross per 24 hour  Intake 360 ml  Output 2150 ml  Net -1790 ml   Filed Weights   06/08/23 0804  Weight: 90.7 kg   Physical Examination: General: Acutely ill-appearing middle-aged man in NAD. Pleasantly confused. HEENT: Normocephalic, anicteric sclera, PERRL 3mm, moist mucous membranes. L frontotemporal crani incision clean/dry/intact with staple closure, scant dried blood. Neuro: Awake, oriented x 1-2. Responds to verbal stimuli. Following commands consistently. Moves all 4 extremities spontaneously. Strength 5/5 in all 4 extremities.  CV: RRR, III/VI systolic murmur. PULM: Breathing even and unlabored on RA. Lung fields CTAB. GI: Soft, nontender, nondistended. Normoactive bowel sounds. Extremities: No LE edema noted. Skin: Warm/dry, no rashes.  Resolved Hospital Problem List:    Assessment & Plan:   Left frontotemporal craniotomy Clipping of anterior communicating artery aneurysm 10/8 History of seizures - POD#7 from Acomm aneurysm clipping - Postoperative management per NSGY (primary) - MRI Brain pending - Goal SBO < 160 - Pain management with APAP, limiting narcotic medications as able - Continue topiramate - Seizure precautions - Neuroprotective measures: HOB > 30 degrees, normoglycemia, normothermia, electrolytes WNL - PT/OT, undergoing CIR evaluation  Left periorbital edema Related to recent surgery. - Supportive care, continue to monitor  A-fib with RVR, resolved Recurrent Afib with RVR overnight 10/12PM-10/13AM. - Remains in NSR - Continue Diltiazem 60mg  Q8H -  Optimize electrolytes K > 4, Mg > 2 - Cardiac monitoring  Hypertension, hyperlipidemia - Continue losartan,  hydralazine, dilitazem - Home amlodipine held in the setting of diltiazem initiation - IV labetalol/hydralazine PRN - ASA/statin  Multifactorial delirium - Continue Seroquel 50mg  BID - Monitor QTc - Posey belt in place, limiting additional tethers/restraints as able - Maintain normal sleep/wake cycles - Delirium precautions  History of obstructive sleep apnea History of tobacco abuse - No longer requiring supplemental O2 support - Bronchodilators PRN - Does not use CPAP at home - Pulmonary hygiene - Nicotine patch  Type 2 diabetes - SSI - CBGs ACHS - Goal CBG 140-180  Chronic kidney disease stage IIIb Hypokalemia - Trend BMP - Replete electrolytes as indicated, K repleted 10/15AM - Monitor I&Os - Avoid nephrotoxic agents as able - Ensure adequate renal perfusion  GERD - PPI  BPH Urinary retention - Flomax - Foley for urinary retention, remove as appropriate  History of anxiety/depression Remains off of Precedex since 10/13. - Continue duloxetine - Ativan, Haldol PRN - QTc monitoring as above  Best Practice: (right click and "Reselect all SmartList Selections" daily)   Diet/type: Regular consistency (see orders) (ADAT per NSGY) DVT prophylaxis: SCDs GI prophylaxis: PPI Lines: N/A and Arterial Line Foley:  N/A Code Status:  full code Last date of multidisciplinary goals of care discussion [Per Primary Team]  Critical care time:   The patient is critically ill with multiple organ system failure and requires high complexity decision making for assessment and support, frequent evaluation and titration of therapies, advanced monitoring, review of radiographic studies and interpretation of complex data.   Critical Care Time devoted to patient care services, exclusive of separately billable procedures, described in this note is 33 minutes.  Tim Lair, PA-C [facility] 06/15/23 7:53 AM  Please see [email or web address] for pager details.  From 7A-7P if no response, please call [phone] After hours, please call ELink [phone]

## 2023-06-15 NOTE — Plan of Care (Signed)
  Problem: Safety: Goal: Non-violent Restraint(s) Outcome: Not Progressing  Patient still attempting to remove posey and get out of bed frequently.    Problem: Nutrition: Goal: Adequate nutrition will be maintained Outcome: Not Progressing  Patient did not eat any of his dinner.

## 2023-06-15 NOTE — Progress Notes (Signed)
LTM EEG hooked up and running - no initial skin breakdown - push button tested - [facility] monitoring.  

## 2023-06-15 NOTE — Progress Notes (Signed)
Inpatient Rehab Admissions Coordinator:   Met with patient at the bedside.  He is lethargic, unable to maintain conversation.  I left a message for his daughter to discuss caregiver support.  LTEEG running.  Will follow.   Estill Dooms, PT, DPT Admissions Coordinator [phone] 06/15/23  2:33 PM

## 2023-06-15 NOTE — Progress Notes (Signed)
  NEUROSURGERY PROGRESS NOTE   no issues overnight. Patient did require a dose of Ativan and Haldol yesterday pm for agitation. Appears to be doing well this am.  EXAM:  BP (!) 140/72 (BP Location: Left Arm)   Pulse 76   Temp 98 F (36.7 C) (Axillary)   Resp 14   Ht 6' (1.829 m)   Wt 90.7 kg   SpO2 95%   BMI 27.12 kg/m   awake, alert, oriented to place and person CN grossly intact Speech fluent MAE good strength wound c/d/i  IMPRESSION:  67 y.o. male  POD# 7 s/p elective Acom clipping. Overall confusion and lethargy appears to be improving but continues to have intermittent confusion and agitation. With his previous history of SZ disorder, question whether craniotomy may have induced some subclinical SZ - Afib with RVR, controlled with oral meds  PLAN: - Will order LT EEG - Cont PT/OT, CIR eval - Appreciate PCCM assistance    Lisbeth Renshaw, MD [facility]

## 2023-06-16 DIAGNOSIS — I671 Cerebral aneurysm, nonruptured: Secondary | ICD-10-CM | POA: Diagnosis not present

## 2023-06-16 DIAGNOSIS — R569 Unspecified convulsions: Secondary | ICD-10-CM | POA: Diagnosis not present

## 2023-06-16 LAB — BASIC METABOLIC PANEL
Anion gap: 12 (ref 5–15)
BUN: 27 mg/dL — ABNORMAL HIGH (ref 8–23)
CO2: 23 mmol/L (ref 22–32)
Calcium: 9.7 mg/dL (ref 8.9–10.3)
Chloride: 110 mmol/L (ref 98–111)
Creatinine, Ser: 1.22 mg/dL (ref 0.61–1.24)
GFR, Estimated: >60 mL/min (ref >60)
Glucose, Bld: 111 mg/dL — ABNORMAL HIGH (ref 70–99)
Potassium: 4.2 mmol/L (ref 3.5–5.1)
Sodium: 145 mmol/L (ref 135–145)

## 2023-06-16 LAB — PHOSPHORUS: Phosphorus: 4.5 mg/dL (ref 2.5–4.6)

## 2023-06-16 LAB — GLUCOSE, CAPILLARY
Glucose-Capillary: 129 mg/dL — ABNORMAL HIGH (ref 70–99)
Glucose-Capillary: 133 mg/dL — ABNORMAL HIGH (ref 70–99)
Glucose-Capillary: 96 mg/dL (ref 70–99)
Glucose-Capillary: 135 mg/dL — ABNORMAL HIGH (ref 70–99)

## 2023-06-16 LAB — MAGNESIUM: Magnesium: 2.3 mg/dL (ref 1.7–2.4)

## 2023-06-16 MED ORDER — QUETIAPINE FUMARATE 25 MG PO TABS
50.0000 mg | ORAL_TABLET | Freq: Every day | ORAL | Status: DC
  Administered 2023-06-16: 50 mg via ORAL
  Filled 2023-06-16: qty 2

## 2023-06-16 MED ORDER — LORAZEPAM 2 MG/ML IJ SOLN: 2.0000 mg | INTRAMUSCULAR | Status: DC | PRN

## 2023-06-16 NOTE — Progress Notes (Signed)
vLTM maintenance  All impedances below 10kohm.  No skin breakdown noted at all skin sites

## 2023-06-16 NOTE — Progress Notes (Signed)
  NEUROSURGERY PROGRESS NOTE   no issues overnight. Much more awake, interactive this am. States he feels better.  EXAM:  BP (!) 143/74 (BP Location: Left Arm)   Pulse 94   Temp 98.5 F (36.9 C) (Oral)   Resp 14   Ht 6' (1.829 m)   Wt 90.7 kg   SpO2 95%   BMI 27.12 kg/m   awake, alert, oriented to place and person but pleasantly confused CN grossly intact Speech fluent MAE good strength wound c/d/i  IMPRESSION:  67 y.o. male  POD# 8 s/p elective Acom clipping. Overall confusion and lethargy appears to be significantly improved today. Awaiting EEG interpretation - Afib with RVR, controlled with oral meds  PLAN: - Will f/u LT EEG interpretation. If negative, can d/c EEG - Cont PT/OT, if EEG negative can transfer to stepdown - Appreciate PCCM assistance    Lisbeth Renshaw, MD [facility]

## 2023-06-16 NOTE — Progress Notes (Signed)
Inpatient Rehab Admissions Coordinator:   Left message for pt's daughter to discuss CIR recommendations and caregiver support.   Estill Dooms, PT, DPT Admissions Coordinator [phone] 06/16/23  1:08 PM

## 2023-06-16 NOTE — Progress Notes (Signed)
Inpatient Rehab Admissions Coordinator:   I stopped by pt's room this afternoon and he was up in chair.  Not as lethargic, but still pretty confused.  Perseverating on going home to take care of his mom, who is 90.  I called sister to discuss rehab recommendations and likely recommendations of 24/7 close supervision given ongoing cognitive deficits.  She asked that we f/u with daughter on caregiver support.  We will continue to follow.   Estill Dooms, PT, DPT Admissions Coordinator [phone] 06/16/23  4:11 PM

## 2023-06-16 NOTE — Plan of Care (Signed)
  Problem: Clinical Measurements: Goal: Neurologic status will improve Outcome: Progressing  Patient seems more calm and coherent while awake.    Problem: Safety: Goal: Non-violent Restraint(s) Outcome: Progressing  Patient's restraints were removed for a couple of hours without incident.    Problem: Nutrition: Goal: Adequate nutrition will be maintained Outcome: Progressing  Patient ate 100% of dinner tray.

## 2023-06-16 NOTE — Progress Notes (Signed)
NAME:  Brian [name], MRN:  [id number], DOB:  08/17/1956, LOS: 8 ADMISSION DATE:  06/08/2023, CONSULTATION DATE:  06/08/2023 REFERRING MD: Conchita Paris - NSGY CHIEF COMPLAINT: S/p L frontotemporal craniotomy, Acomm clipping    History of Present Illness:  67 year old man who presented to [facility] 10/8 for planned management of cerebral aneurysm (detected incidentally on MRI Brain during seizure workup. PMHx significant for HTN, OSA (not on CPAP), tobacco abuse, T2DM (diet-controlled), GERD, anxiety/depression, seizure disorder (iniital seizure age 63, another ~1 month PTA), and CKD stage 3b. Patient underwent scheduled left frontotemporal craniotomy with anterior communicating artery aneurysm clipping 10/8 with NSGY (Dr. Conchita Paris).  PCCM consulted for postoperative management.  Pertinent Medical History:  HTN, OSA (not on CPAP), tobacco abuse, T2DM (diet-controlled), GERD, anxiety/depression, seizure disorder (iniital seizure age 25, another ~1 month PTA), and CKD stage 3b  Significant Hospital Events: Including procedures, antibiotic start and stop dates in addition to other pertinent events   10/8 - Underwent scheduled L frontotemporal craniotomy with Acomm artery aneurysm clipping 10/9 - CPAP PRN, mental status improved. Remains on Cardene gtt. 10/10 - Cardene gtt weaned off. A-line removed per NSGY. Confused in afternoon requiring sitter, mitts. Redirectable. 10/11 - Agitated overnight requiring Haldol, no significant response. Noted to be in Afib RVR with rates 150s. Diltiazem gtt started. Low-dose Precedex gtt for agitation. 10/12 - Off diltiazem, off Precedex, as needed Ativan. 10/14 - Mental status slowly improving, still with intermittent agitation/impulsivity. Haldol added PRN. 10/15 EEG ordered  Interim History / Subjective:  EEG ordered yesterday given prior seizures and ongoing confusion. It is hooked up this morning, but not interpreted yet. Got 1 dose prn ativan early this morning for  agitation. Peeled off all his EEG leads this am so now in mitts.   Objective:  Blood pressure (!) 143/74, pulse 94, temperature 98.5 F (36.9 C), temperature source Oral, resp. rate 14, height 6' (1.829 m), weight 90.7 kg, SpO2 95%.        Intake/Output Summary (Last 24 hours) at 06/16/2023 0842 Last data filed at 06/16/2023 0800 Gross per 24 hour  Intake 560 ml  Output 1550 ml  Net -990 ml   Filed Weights   06/08/23 0804  Weight: 90.7 kg   Physical Examination: Awake, alert, oriented to self only Knows he is in [place] but does not know we're in a hospital or the situation Hallucinated with ativan Tachycardic, regular, systolic murmur Breathing non labored on room air No edema EEG leads in place In mitts for safety  Resolved Hospital Problem List:  Atrial fibrillation with RVR  Assessment & Plan:   Left frontotemporal craniotomy Clipping of anterior communicating artery aneurysm 10/8 History of seizures - POD#7 from Acomm aneurysm clipping - Postoperative management per NSGY (primary) - MRI Brain pending - Goal SBO < 160 - Pain management with APAP, limiting narcotic medications as able - Continue topiramate - Seizure precautions - Neuroprotective measures: HOB > 30 degrees, normoglycemia, normothermia, electrolytes WNL - PT/OT, undergoing CIR evaluation  Multifactorial delirium - taper Seroquel 50mg  BID to at bedtime only.  - Monitor QTc - Posey belt in place, limiting additional tethers/restraints as able - Maintain normal sleep/wake cycles - Delirium precautions - stop prn ativan for agitation will leave on board for seizure only.  - await EEG interpretation. Needs OOB.   Left periorbital edema Related to recent surgery. - Supportive care, continue to monitor  Hypertension, hyperlipidemia - Continue losartan, hydralazine, dilitazem, amlodipine - IV labetalol/hydralazine PRN - has not needed.  -  ASA/statin   History of obstructive sleep  apnea History of tobacco abuse - No longer requiring supplemental O2 support - Bronchodilators PRN - Does not use CPAP at home - Pulmonary hygiene - Nicotine patch  Type 2 diabetes - SSI - CBGs ACHS - Goal CBG 140-180  Chronic kidney disease stage IIIb Hypokalemia - Trend BMP - Replete electrolytes as indicated, K repleted 10/15AM - Monitor I&Os - Avoid nephrotoxic agents as able - Ensure adequate renal perfusion  GERD - PPI  BPH Urinary retention - Flomax - Foley for urinary retention, remove as appropriate  History of anxiety/depression Remains off of Precedex since 10/13. - Continue duloxetine - Ativan, Haldol PRN - QTc monitoring as above  Best Practice: (right click and "Reselect all SmartList Selections" daily)   Diet/type: Regular consistency (see orders) DVT prophylaxis: SCDs GI prophylaxis: PPI Lines: N/A and Arterial Line Foley:  N/A Code Status:  full code Last date of multidisciplinary goals of care discussion [Per Primary Team]  Durel Salts, MD Pulmonary and Critical Care Medicine [facility] 06/16/2023 8:43 AM Pager: see AMION  If no response to pager, please call critical care on call (see AMION) until 7pm After 7:00 pm call Elink

## 2023-06-16 NOTE — Procedures (Addendum)
Patient Name: Brian [name]  MRN: [id number]  Epilepsy Attending: Charlsie Quest  Referring Physician/Provider: Lisbeth Renshaw, MD  Duration: 06/15/2023 1341 to 06/16/2023 1330  Patient history: 67 y.o. male  POD# 7 s/p elective Acom clipping. Overall confusion and lethargy appears to be improving but continues to have intermittent confusion and agitation. EEG to evaluate for seizure  Level of alertness: Awake, asleep  AEDs during EEG study: GBP  Technical aspects: This EEG study was done with scalp electrodes positioned according to the 10-20 International system of electrode placement. Electrical activity was reviewed with band pass filter of 1-70Hz , sensitivity of 7 uV/mm, display speed of 43mm/sec with a 60Hz  notched filter applied as appropriate. EEG data were recorded continuously and digitally stored.  Video monitoring was available and reviewed as appropriate.  Description: The posterior dominant rhythm consists of 8 Hz activity of moderate voltage (25-35 uV) seen predominantly in posterior head regions, symmetric and reactive to eye opening and eye closing. Sleep was characterized by vertex waves, sleep spindles (12 to 14 Hz), maximal frontocentral region. EEG showed intermittent generalized 3 to 6 Hz theta-delta slowing. Hyperventilation and photic stimulation were not performed.     ABNORMALITY - Intermittent slow, generalized  IMPRESSION: This study is suggestive of mild diffuse encephalopathy. No seizures or epileptiform discharges were seen throughout the recording.  Please note lack of epileptiform activity during interictal EEG does not exclude the diagnosis of epilepsy.  [name] Annabelle Harman

## 2023-06-16 NOTE — Progress Notes (Signed)
Physical Therapy Treatment Patient Details Name: Brian [name] MRN: [id number] DOB: 02/25/1956 Today's Date: 06/16/2023   History of Present Illness Pt is a 67 y.o. male who presented 06/08/23 for left frontotemporal craniotomy for clipping of anterior communicating artery aneurysm. Hospital course complicated by pt agitation and afib with RVR. CT head 10/9 showed mild to moderate bifrontal pneumocephalus and small volume of extra-axial hemorrhage with mild mass effect on the frontal lobes without midline shift. PMH: OSA (not using the CPAP), HTN, diet controlled DM-2, tobacco smoking for 52 yrs, GERD, anxiety, depression, CVA, seizure disorder, and CKD-3b    PT Comments  Pt is making steady progress with functional mobility. He was able to progress to ambulating x2 laps within the room with a RW for support and min-modA for balance. He needs cues to widen his BOS, improve his posture, and maintain midline (leans L). He remains confused, believing he is currently at school in "study hall". Re-oriented pt multiple times but pt remained confused. Will continue to follow acutely.     If plan is discharge home, recommend the following: A lot of help with walking and/or transfers;A lot of help with bathing/dressing/bathroom;Assistance with cooking/housework;Direct supervision/assist for financial management;Direct supervision/assist for medications management;Assist for transportation;Help with stairs or ramp for entrance;Supervision due to cognitive status   Can travel by private vehicle        Equipment Recommendations  Other (comment) (TBA)    Recommendations for Other Services       Precautions / Restrictions Precautions Precautions: Fall Precaution Comments: posey belt Restrictions Weight Bearing Restrictions: No     Mobility  Bed Mobility               General bed mobility comments: Pt up in chair upon arrival.    Transfers Overall transfer level: Needs  assistance Equipment used: Rolling walker (2 wheels) Transfers: Sit to/from Stand Sit to Stand: Mod assist           General transfer comment: ModA to power up to stand from chair to RW    Ambulation/Gait Ambulation/Gait assistance: Min assist, Mod assist Gait Distance (Feet): 60 Feet Assistive device: Rolling walker (2 wheels) Gait Pattern/deviations: Decreased step length - left, Decreased stride length, Step-through pattern, Decreased step length - right, Narrow base of support, Trunk flexed Gait velocity: reduced Gait velocity interpretation: <1.31 ft/sec, indicative of household ambulator   General Gait Details: Pt ambulates with a flexed posture and narrow BOS. Verbal, tactile, and visual cues provided to widen his stance and look superiorly and improve his posture, momentary success noted. Pt noted to flex laterally and lean to his L as well, min-modA for balance   Stairs             Wheelchair Mobility     Tilt Bed    Modified Rankin (Stroke Patients Only) Modified Rankin (Stroke Patients Only) Pre-Morbid Rankin Score: No symptoms Modified Rankin: Moderately severe disability     Balance Overall balance assessment: Needs assistance Sitting-balance support: Feet supported Sitting balance-Leahy Scale: Fair   Postural control: Posterior lean, Left lateral lean Standing balance support: Bilateral upper extremity supported, During functional activity, Reliant on assistive device for balance Standing balance-Leahy Scale: Poor Standing balance comment: Reliant on RW and min-modA to ambulate, pt tends to lean L                            Cognition Arousal: Alert Behavior During Therapy: Impulsive, Flat affect  Overall Cognitive Status: Impaired/Different from baseline Area of Impairment: Orientation, Attention, Problem solving, Awareness, Memory, Following commands, Safety/judgement                 Orientation Level: Disoriented to, Place,  Time, Situation Current Attention Level: Sustained Memory: Decreased short-term memory, Decreased recall of precautions Following Commands: Follows one step commands with increased time, Follows one step commands consistently Safety/Judgement: Decreased awareness of safety, Decreased awareness of deficits Awareness: Intellectual Problem Solving: Slow processing, Difficulty sequencing, Requires verbal cues, Requires tactile cues General Comments: Pt with flat affect throughout session. He reported he was at school in "study hall" and then reported he was "here to pick up my grandkids". Pt correcting himself of his grandkids' names multiple times. Slow to process cues and needs redirecting at times, but easily redirectable when he would become mildly impulsive. Unaware of situation.        Exercises      General Comments General comments (skin integrity, edema, etc.): VSS on RA      Pertinent Vitals/Pain Pain Assessment Pain Assessment: Faces Faces Pain Scale: No hurt Pain Intervention(s): Monitored during session    Home Living                          Prior Function            PT Goals (current goals can now be found in the care plan section) Acute Rehab PT Goals Patient Stated Goal: to get stronger and improve PT Goal Formulation: With patient Time For Goal Achievement: 06/26/23 Potential to Achieve Goals: Good Progress towards PT goals: Progressing toward goals    Frequency    Min 1X/week      PT Plan      Co-evaluation              AM-PAC PT "6 Clicks" Mobility   Outcome Measure  Help needed turning from your back to your side while in a flat bed without using bedrails?: A Little Help needed moving from lying on your back to sitting on the side of a flat bed without using bedrails?: A Little Help needed moving to and from a bed to a chair (including a wheelchair)?: A Lot Help needed standing up from a chair using your arms (e.g., wheelchair  or bedside chair)?: A Lot Help needed to walk in hospital room?: A Lot Help needed climbing 3-5 steps with a railing? : Total 6 Click Score: 13    End of Session Equipment Utilized During Treatment: Gait belt Activity Tolerance: Patient tolerated treatment well Patient left: in chair;with call bell/phone within reach;with chair alarm set;with restraints reapplied Nurse Communication: Mobility status PT Visit Diagnosis: Unsteadiness on feet (R26.81);Other abnormalities of gait and mobility (R26.89);Difficulty in walking, not elsewhere classified (R26.2);Other symptoms and signs involving the nervous system (R29.898);Muscle weakness (generalized) (M62.81)     Time: 1610-9604 PT Time Calculation (min) (ACUTE ONLY): 14 min  Charges:    $Gait Training: 8-22 mins PT General Charges $$ ACUTE PT VISIT: 1 Visit                     Virgil Benedict, PT, DPT Acute Rehabilitation Services  Office: [phone]    Bettina Gavia 06/16/2023, 3:08 PM

## 2023-06-16 NOTE — Progress Notes (Signed)
Occupational Therapy Treatment Patient Details Name: Brian [name] MRN: [id number] DOB: 1955-11-04 Today's Date: 06/16/2023   History of present illness Pt is a 67 y.o. male who presented 06/08/23 for left frontotemporal craniotomy for clipping of anterior communicating artery aneurysm. Hospital course complicated by pt agitation and afib with RVR. CT head 10/9 showed mild to moderate bifrontal pneumocephalus and small volume of extra-axial hemorrhage with mild mass effect on the frontal lobes without midline shift. PMH: OSA (not using the CPAP), HTN, diet controlled DM-2, tobacco smoking for 52 yrs, GERD, anxiety, depression, CVA, seizure disorder, and CKD-3b   OT comments  Pt is making steady progress with OT.  He demonstrates increased sustained attention and better orientation with stating month and day of the week.  Still with poor awareness overall, requesting to walk-out to the parking lot and smoke.  Easily re-directed however without agitation when told it's not allowed or safe based on his current situation.  Mod assist level for short distance mobility forward and back a few steps and then to the recliner without an assistive device.  Recommend continued acute care OT at this time with transition to intensive inpatient follow up therapy, >3 hours/day of family is available to provide 24 hour assist post discharge.        If plan is discharge home, recommend the following:  Direct supervision/assist for medications management;Help with stairs or ramp for entrance;Direct supervision/assist for financial management;Assistance with feeding;Assistance with cooking/housework;A lot of help with bathing/dressing/bathroom;A lot of help with walking and/or transfers;Assist for transportation   Equipment Recommendations  Other (comment) (TBD next venue of care)       Precautions / Restrictions Precautions Precautions: Fall Precaution Comments: posey belt and LTM EEG Restrictions Weight  Bearing Restrictions: No       Mobility Bed Mobility Overal bed mobility: Needs Assistance Bed Mobility: Supine to Sit, Sit to Supine Rolling: Mod assist   Supine to sit: Mod assist     General bed mobility comments: Mod demonstrational cueing with mod assist to sequence and complete rolling.    Transfers Overall transfer level: Needs assistance Equipment used: None Transfers: Sit to/from Stand Sit to Stand: Mod assist     Step pivot transfers: Mod assist           Balance Overall balance assessment: Needs assistance Sitting-balance support: Feet supported Sitting balance-Leahy Scale: Fair Sitting balance - Comments: Pt able to sit statically with supervision  EOB unsupported.     Standing balance-Leahy Scale: Poor Standing balance comment: Needs support at mod assist level to maintain balance in standing.                           ADL either performed or assessed with clinical judgement   ADL Overall ADL's : Needs assistance/impaired     Grooming: Supervision/safety;Sitting                   Toilet Transfer: Moderate assistance;Stand-pivot Toilet Transfer Details (indicate cue type and reason): simulated to recliner         Functional mobility during ADLs: Moderate assistance (ambulation without assistive device) General ADL Comments: Pt alert and pleasant throughout session.  Vitals stable with some confusion noted as pt pulled off his pulse ox from his ear and then also attempted to pull EEG lines.  Mod redirection needed.  He still exhibits decreased awareness stating he's going home tonight, but is re-directable.  Completed quick vision screening with  pt demonstrating good ROM and tracking and no gross peripheral deficits.  He states he wears glasses for far vision, but was able to read the correct time on the clock without them.      Cognition Arousal: Alert Behavior During Therapy: Impulsive Overall Cognitive Status:  Impaired/Different from baseline Area of Impairment: Orientation, Attention, Problem solving, Awareness, Memory, Following commands, Safety/judgement                 Orientation Level: Disoriented to, Place, Time, Situation Current Attention Level: Sustained Memory: Decreased short-term memory Following Commands: Follows one step commands with increased time Safety/Judgement: Decreased awareness of safety, Decreased awareness of deficits Awareness: Intellectual Problem Solving: Slow processing, Difficulty sequencing, Requires verbal cues, Requires tactile cues General Comments: Pt oriented to month and day of the week but not place or reason for being here.  When asked if he felt off balance in standing, he was able to however acknowledge deficits but was not aware that he couldn't walk outside to smoke.                   Pertinent Vitals/ Pain       Pain Assessment Pain Assessment: Faces Faces Pain Scale: No hurt         Frequency  Min 1X/week        Progress Toward Goals  OT Goals(current goals can now be found in the care plan section)  Progress towards OT goals: Progressing toward goals  Acute Rehab OT Goals Patient Stated Goal: Pt wants to go smoke OT Goal Formulation: Patient unable to participate in goal setting Time For Goal Achievement: 06/26/23 Potential to Achieve Goals: Fair  Plan         AM-PAC OT "6 Clicks" Daily Activity     Outcome Measure   Help from another person eating meals?: A Little Help from another person taking care of personal grooming?: A Little Help from another person toileting, which includes using toliet, bedpan, or urinal?: A Lot Help from another person bathing (including washing, rinsing, drying)?: A Lot Help from another person to put on and taking off regular upper body clothing?: A Little Help from another person to put on and taking off regular lower body clothing?: A Lot 6 Click Score: 15    End of Session  Equipment Utilized During Treatment: Gait belt  OT Visit Diagnosis: Unsteadiness on feet (R26.81);Other abnormalities of gait and mobility (R26.89);Muscle weakness (generalized) (M62.81);Other symptoms and signs involving cognitive function   Activity Tolerance Patient tolerated treatment well   Patient Left in chair;with call bell/phone within reach;with chair alarm set   Nurse Communication Mobility status        Time: 4098-1191 OT Time Calculation (min): 31 min  Charges: OT General Charges $OT Visit: 1 Visit OT Treatments $Self Care/Home Management : 23-37 mins  Perrin Maltese, OTR/L Acute Rehabilitation Services  Office [phone] 06/16/2023

## 2023-06-16 NOTE — Progress Notes (Signed)
LTM EEG discontinued - no skin breakdown at [facility].

## 2023-06-17 DIAGNOSIS — I671 Cerebral aneurysm, nonruptured: Secondary | ICD-10-CM | POA: Diagnosis not present

## 2023-06-17 LAB — GLUCOSE, CAPILLARY
Glucose-Capillary: 101 mg/dL — ABNORMAL HIGH (ref 70–99)
Glucose-Capillary: 145 mg/dL — ABNORMAL HIGH (ref 70–99)
Glucose-Capillary: 99 mg/dL (ref 70–99)
Glucose-Capillary: 112 mg/dL — ABNORMAL HIGH (ref 70–99)

## 2023-06-17 MED ORDER — QUETIAPINE FUMARATE 25 MG PO TABS
25.0000 mg | ORAL_TABLET | Freq: Every day | ORAL | Status: DC
  Administered 2023-06-17 – 2023-07-04 (×18): 25 mg via ORAL
  Filled 2023-06-17 (×18): qty 1

## 2023-06-17 NOTE — Progress Notes (Signed)
Inpatient Rehab Admissions Coordinator:  Called pt's daughter [name] to verify dispo. Left a message; awaiting return call. Will continue to follow.   Wolfgang Phoenix, MS, CCC-SLP Admissions Coordinator [phone]
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
## 2023-06-17 NOTE — Progress Notes (Signed)
  NEUROSURGERY PROGRESS NOTE   no issues overnight. Minimal HA this am.  EXAM:  BP 121/72   Pulse 79   Temp 98.3 F (36.8 C) (Oral)   Resp 18   Ht 6' (1.829 m)   Wt 90.7 kg   SpO2 97%   BMI 27.12 kg/m   awake, alert, oriented to place and person but pleasantly confused CN grossly intact Speech fluent MAE good strength wound c/d/i  IMPRESSION:  67 y.o. male  POD# 9 s/p elective Acom clipping. Overall confusion and lethargy significantly improved. Overnight EEG negative for SZ. - Afib with RVR, controlled with oral meds  PLAN: - Stable for transfer to stepdown or CIR - Cont PT/OT - Cont home topamax - Cont home norvasc   Lisbeth Renshaw, MD [facility]
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
## 2023-06-17 NOTE — Progress Notes (Signed)
   06/17/23 0100  BiPAP/CPAP/SIPAP  Reason BIPAP/CPAP not in use Non-compliant (Pt refused)

## 2023-06-17 NOTE — Progress Notes (Signed)
Patient received from 4N, alert and oriented X2, vital signs stable, tele box connected, is in chair, call bell within reach, chair alarm on, will continue to monitor.

## 2023-06-17 NOTE — Plan of Care (Signed)
  Problem: Education: Goal: Knowledge of the prescribed therapeutic regimen will improve Outcome: Progressing   Problem: Education: Goal: Knowledge of General Education information will improve Description: Including pain rating scale, medication(s)/side effects and non-pharmacologic comfort measures Outcome: Progressing   Problem: Clinical Measurements: Goal: Respiratory complications will improve Outcome: Progressing Goal: Cardiovascular complication will be avoided Outcome: Progressing   Problem: Activity: Goal: Risk for activity intolerance will decrease Outcome: Progressing   Problem: Nutrition: Goal: Adequate nutrition will be maintained Outcome: Progressing   Problem: Coping: Goal: Level of anxiety will decrease Outcome: Progressing   Problem: Elimination: Goal: Will not experience complications related to urinary retention Outcome: Progressing

## 2023-06-17 NOTE — Progress Notes (Signed)
Physical Therapy Treatment Patient Details Name: Brian [name] MRN: [id number] DOB: [birth year] Today's Date: 06/17/2023   History of Present Illness Pt is a 67 y.o. male who presented 06/08/23 for left frontotemporal craniotomy for clipping of anterior communicating artery aneurysm. Hospital course complicated by pt agitation and afib with RVR. CT head 10/9 showed mild to moderate bifrontal pneumocephalus and small volume of extra-axial hemorrhage with mild mass effect on the frontal lobes without midline shift. PMH: OSA (not using the CPAP), HTN, diet controlled DM-2, tobacco smoking for 52 yrs, GERD, anxiety, depression, CVA, seizure disorder, and CKD-3b    PT Comments  Pt is making great functional progress. He was initially disoriented but as session progressed he became less confused and increasingly aware of his safety deficits and began to recall parts of his hospitalization. He recalled arguing with his deceased father during a moment of confusion during this hospitalization. Pt endorses concern over leaving his 66 y.o. mother at home and reports he has been having some memory deficits on and off again for the past ~1 year. Provided active listening and encouragement to pt. He was able to progress to ambulating in the halls and performing transfers with only CGA today. He needed CGA with intermittent minA to ambulate with a RW but displayed instability and need for minA to prevent LOB when trying to ambulate without UE support. Pt reports he was independent without DME prior to admission. Current recommendations remain appropriate. Will continue to follow acutely.     If plan is discharge home, recommend the following: Assistance with cooking/housework;Direct supervision/assist for financial management;Direct supervision/assist for medications management;Assist for transportation;Help with stairs or ramp for entrance;Supervision due to cognitive status;A little help with walking and/or  transfers;A little help with bathing/dressing/bathroom   Can travel by private vehicle        Equipment Recommendations  Rolling walker (2 wheels);Other (comment) (shower chair)    Recommendations for Other Services       Precautions / Restrictions Precautions Precautions: Fall Precaution Comments: posey belt Restrictions Weight Bearing Restrictions: No     Mobility  Bed Mobility Overal bed mobility: Needs Assistance Bed Mobility: Supine to Sit     Supine to sit: Contact guard, HOB elevated     General bed mobility comments: Extra time and cues to initiate, CGA for safety    Transfers Overall transfer level: Needs assistance Equipment used: Rolling walker (2 wheels) Transfers: Sit to/from Stand Sit to Stand: Contact guard assist           General transfer comment: CGA for safety, mild instability noted but no LOB    Ambulation/Gait Ambulation/Gait assistance: Min assist, Contact guard assist Gait Distance (Feet): 220 Feet Assistive device: Rolling walker (2 wheels), None Gait Pattern/deviations: Decreased step length - left, Decreased stride length, Step-through pattern, Decreased step length - right, Narrow base of support, Trunk flexed, Drifts right/left Gait velocity: reduced Gait velocity interpretation: <1.31 ft/sec, indicative of household ambulator   General Gait Details: VCs provided to widen BOS and improve posture. CGA with intermittent minA to ambulate with a RW. Attempted ~20 ft without UE support with pt displaying decreased bil step length and a tendency to sway laterally bil when cued to turn head L <> R or look up <> down, minA for balance and safety.   Stairs             Wheelchair Mobility     Tilt Bed    Modified Rankin (Stroke Patients Only) Modified Rankin (Stroke  Patients Only) Pre-Morbid Rankin Score: No symptoms Modified Rankin: Moderately severe disability     Balance Overall balance assessment: Needs  assistance Sitting-balance support: Feet supported Sitting balance-Leahy Scale: Fair     Standing balance support: Bilateral upper extremity supported, During functional activity, Reliant on assistive device for balance, No upper extremity supported Standing balance-Leahy Scale: Poor Standing balance comment: Reliant on RW and up to [employer] Arousal: Alert Behavior During Therapy: Flat affect Overall Cognitive Status: Impaired/Different from baseline Area of Impairment: Orientation, Attention, Problem solving, Awareness, Memory, Following commands, Safety/judgement                 Orientation Level: Disoriented to, Place, Time, Situation Current Attention Level: Sustained Memory: Decreased short-term memory, Decreased recall of precautions Following Commands: Follows one step commands with increased time, Follows one step commands consistently, Follows multi-step commands with increased time Safety/Judgement: Decreased awareness of safety, Decreased awareness of deficits Awareness: Emergent Problem Solving: Slow processing, Difficulty sequencing, Requires verbal cues General Comments: Pt initially believed he was in [place] and was unaware he had surgery. As session progressed, pt appeared to become less confused and acknowledged recall of his confusion during this admission. he recalls arguing with his deceased father during a moment of confusion this admission. He seemed to become increasingly more aware and understanding of the need for the posey belt considering his confusion. Pt tearful over leaving his 66 yo mother home alone while he is here. Pt reports having memory issues on and off again for the past year and is unsure why. Follows cues slowly but appropriately.        Exercises      General Comments        Pertinent Vitals/Pain Pain Assessment Pain Assessment: Faces Faces Pain Scale: Hurts little more Pain Location:  "from head to toe" Pain Descriptors / Indicators: Discomfort, Grimacing Pain Intervention(s): Limited activity within patient's tolerance, Monitored during session, Repositioned    Home Living                          Prior Function            PT Goals (current goals can now be found in the care plan section) Acute Rehab PT Goals Patient Stated Goal: to improve PT Goal Formulation: With patient Time For Goal Achievement: 06/26/23 Potential to Achieve Goals: Good Progress towards PT goals: Progressing toward goals    Frequency    Min 1X/week      PT Plan      Co-evaluation              AM-PAC PT "6 Clicks" Mobility   Outcome Measure  Help needed turning from your back to your side while in a flat bed without using bedrails?: A Little Help needed moving from lying on your back to sitting on the side of a flat bed without using bedrails?: A Little Help needed moving to and from a bed to a chair (including a wheelchair)?: A Little Help needed standing up from a chair using your arms (e.g., wheelchair or bedside chair)?: A Little Help needed to walk in hospital room?: A Little Help needed climbing 3-5 steps with a railing? : A Lot 6 Click Score: 17    End of Session Equipment Utilized During Treatment: Gait belt  Activity Tolerance: Patient tolerated treatment well Patient left: in chair;with call bell/phone within reach;with chair alarm set;with restraints reapplied Nurse Communication: Mobility status PT Visit Diagnosis: Unsteadiness on feet (R26.81);Other abnormalities of gait and mobility (R26.89);Difficulty in walking, not elsewhere classified (R26.2);Other symptoms and signs involving the nervous system (R29.898);Muscle weakness (generalized) (M62.81)     Time: 0960-4540 PT Time Calculation (min) (ACUTE ONLY): 25 min  Charges:    $Gait Training: 8-22 mins $Therapeutic Activity: 8-22 mins PT General Charges $$ ACUTE PT VISIT: 1 Visit                      Virgil Benedict, PT, DPT Acute Rehabilitation Services  Office: [phone]    Bettina Gavia 06/17/2023, 1:48 PM

## 2023-06-17 NOTE — Progress Notes (Signed)
NAME:  Brian [name], MRN:  [id number], DOB:  Jan 21, 1956, LOS: 9 ADMISSION DATE:  06/08/2023, CONSULTATION DATE:  06/08/2023 REFERRING MD: Conchita Paris - NSGY CHIEF COMPLAINT: S/p L frontotemporal craniotomy, Acomm clipping    History of Present Illness:  67 year old man who presented to [facility] 10/8 for planned management of cerebral aneurysm (detected incidentally on MRI Brain during seizure workup. PMHx significant for HTN, OSA (not on CPAP), tobacco abuse, T2DM (diet-controlled), GERD, anxiety/depression, seizure disorder (iniital seizure age 66, another ~1 month PTA), and CKD stage 3b. Patient underwent scheduled left frontotemporal craniotomy with anterior communicating artery aneurysm clipping 10/8 with NSGY (Dr. Conchita Paris).  PCCM consulted for postoperative management.  Pertinent Medical History:  HTN, OSA (not on CPAP), tobacco abuse, T2DM (diet-controlled), GERD, anxiety/depression, seizure disorder (iniital seizure age 50, another ~1 month PTA), and CKD stage 3b  Significant Hospital Events: Including procedures, antibiotic start and stop dates in addition to other pertinent events   10/8 - Underwent scheduled L frontotemporal craniotomy with Acomm artery aneurysm clipping 10/9 - CPAP PRN, mental status improved. Remains on Cardene gtt. 10/10 - Cardene gtt weaned off. A-line removed per NSGY. Confused in afternoon requiring sitter, mitts. Redirectable. 10/11 - Agitated overnight requiring Haldol, no significant response. Noted to be in Afib RVR with rates 150s. Diltiazem gtt started. Low-dose Precedex gtt for agitation. 10/12 - Off diltiazem, off Precedex, as needed Ativan. 10/14 - Mental status slowly improving, still with intermittent agitation/impulsivity. Haldol added PRN. 10/15 EEG ordered - diffuse slowing but no seizures.  Interim History / Subjective:    More awake by afternoon. Still confused. Improving compared to yesterday.   Objective:  Blood pressure 136/72, pulse 61,  temperature 97.6 F (36.4 C), temperature source Oral, resp. rate 12, height 6' (1.829 m), weight 90.7 kg, SpO2 93%.        Intake/Output Summary (Last 24 hours) at 06/17/2023 1019 Last data filed at 06/17/2023 0600 Gross per 24 hour  Intake 500 ml  Output 2700 ml  Net -2200 ml   Filed Weights   06/08/23 0804  Weight: 90.7 kg   Physical Examination: General: average build, sitting in chair, calm HEENT: left craniotomy incision without redness or edema.  Chest: clear CV: normal. Abdo: soft Neuro: awake and alert. No insight into medical condition but able to answer questions. No focal deficits.   Ancillary Tests Personally Reviewed:  Labs unremarkable.  Assessment & Plan:   Left frontotemporal craniotomy Clipping of anterior communicating artery aneurysm 10/8 History of seizures - POD#9 from elective unruptured Acomm aneurysm clipping - MRI Brain cancelled as felt would not change management.  - Goal SBP< 160 - Pain management with APAP, limiting narcotic medications as able - Continue topiramate, seizure precautions - Neuroprotective measures: HOB > 30 degrees, normoglycemia, normothermia, electrolytes WNL - PT/OT, undergoing CIR evaluation  Multifactorial delirium, no seizures detected.  - taper Seroquel 50mg  BID to at bedtime only.  - Monitor QTc - Maintain normal sleep/wake cycles - Stop prn ativan for agitation will leave on board for seizure only.  - Delirium should improve with transfer to lower acuity unit.  Left periorbital edema Related to recent surgery. Improving - Supportive care, continue to monitor  Hypertension, hyperlipidemia - Continue losartan, hydralazine, dilitazem, amlodipine - ASA/statin  History of obstructive sleep apnea History of tobacco abuse - Bronchodilators PRN - Does not use CPAP at home and refusing CPAP in hospital. - Nicotine patch  Type 2 diabetes - SSI - CBGs ACHS - Goal CBG 140-180 -  currently well controlled.    GERD - PPI  BPH Urinary retention - Flomax - Foley for urinary retention, remove as appropriate  History of anxiety/depression - Continue duloxetine - QTc monitoring as above  Atrial Fibrillation with RVR - now resolved, immediately postop. - If recurs, obtain echo and consider anticoagulation  Discussed with Dr Conchita Paris: ready for transfer.   Best Practice: (right click and "Reselect all SmartList Selections" daily)   Diet/type: Regular consistency (see orders) DVT prophylaxis: SCDs GI prophylaxis: PPI Lines: N/A and Arterial Line Foley:  N/A Code Status:  full code Last date of multidisciplinary goals of care discussion [Per Primary Team]   Lynnell Catalan, MD [facility] ICU Physician [facility]  Pager: [phone] Or Epic Secure Chat After hours: [phone].  06/17/2023, 10:22 AM

## 2023-06-18 ENCOUNTER — Inpatient Hospital Stay (HOSPITAL_COMMUNITY): Payer: Medicare HMO

## 2023-06-18 MED ORDER — KETOROLAC TROMETHAMINE 60 MG/2ML IM SOLN
15.0000 mg | Freq: Once | INTRAMUSCULAR | Status: AC
  Administered 2023-06-18: 15 mg via INTRAMUSCULAR
  Filled 2023-06-18: qty 2

## 2023-06-18 NOTE — Care Management Important Message (Signed)
Important Message  Patient Details  Name: Brian [name] MRN: [id number] Date of Birth: 22-Aug-1956   Important Message Given:  Yes - Medicare IM     Dorena Bodo 06/18/2023, 3:42 PM

## 2023-06-18 NOTE — Progress Notes (Addendum)
Patient ID: Brian [name], male   DOB: [birth year], 67 y.o.   MRN: [id number] [name] as awake and alert today.  Is watching the Justice [employer]her  show when I see him today.  His MRIs been completed Noted that he has a small infarct related to his anterior cerebral distribution probably from a perforator.  There is no significant mass effect.  Believe that this will heal on its own.  He will require some placement as he recovers from his surgery.  He has been having some headaches and a small dose of Toradol was ordered as he does have some mild renal failure.

## 2023-06-18 NOTE — Progress Notes (Signed)
Inpatient Rehab Admissions Coordinator:  Spoke with pt's daughter [name] on the telephone. Discussed CIR goals and expectations. She acknowledged understanding. She informed AC that her grandmother who is 67 years old would only be able to provide supervision support for pt and that she Teacher, English as a foreign language) works so she wouldn't be able to provide support for pt. She is agreeable to other rehab venue placement. TOC made aware.   Wolfgang Phoenix, MS, CCC-SLP Admissions Coordinator [phone]

## 2023-06-18 NOTE — Progress Notes (Signed)
Physical Therapy Treatment Patient Details Name: Brian [name] MRN: [id number] DOB: 1955/11/20 Today's Date: 06/18/2023   History of Present Illness Pt is a 67 y.o. male who presented 06/08/23 for left frontotemporal craniotomy for clipping of anterior communicating artery aneurysm. Hospital course complicated by pt agitation and afib with RVR. CT head 10/9 showed mild to moderate bifrontal pneumocephalus and small volume of extra-axial hemorrhage with mild mass effect on the frontal lobes without midline shift. PMH: OSA (not using the CPAP), HTN, diet controlled DM-2, tobacco smoking for 52 yrs, GERD, anxiety, depression, CVA, seizure disorder, and CKD-3b    PT Comments  Pt greeted resting in bed and eager for session. Pt demonstrating continued great functional progress however pt limited by confusion, impaired cognition and decreased awareness of deficits placing pt at high risk for falls. Pt requiring grossly CGA for bed mobility, transfers and gait with RW support, however pt needing dense cues to utilize RW as pt leaving RW behind and walking to cabinet in room to look for slippers, and requiring min A to maintain balance as pt with noted instability. Pt unable to follow multistep commands during gait. Plan to progress/challenge dual tasking next session. Current plan remains appropriate to address deficits and maximize functional independence and decrease caregiver burden. Pt continues to benefit from skilled PT services to progress toward functional mobility goals.     If plan is discharge home, recommend the following: Assistance with cooking/housework;Direct supervision/assist for financial management;Direct supervision/assist for medications management;Assist for transportation;Help with stairs or ramp for entrance;Supervision due to cognitive status;A little help with walking and/or transfers;A little help with bathing/dressing/bathroom   Can travel by private vehicle        Equipment  Recommendations  Rolling walker (2 wheels);Other (comment) (shower chair)    Recommendations for Other Services       Precautions / Restrictions Precautions Precautions: Fall Restrictions Weight Bearing Restrictions: No     Mobility  Bed Mobility Overal bed mobility: Needs Assistance Bed Mobility: Supine to Sit     Supine to sit: Contact guard, HOB elevated     General bed mobility comments: CGA for safety    Transfers Overall transfer level: Needs assistance Equipment used: Rolling walker (2 wheels) Transfers: Sit to/from Stand Sit to Stand: Contact guard assist           General transfer comment: CGA for safety, mild instability noted but no LOB    Ambulation/Gait Ambulation/Gait assistance: Min assist, Contact guard assist Gait Distance (Feet): 350 Feet (+ 15) Assistive device: Rolling walker (2 wheels), None Gait Pattern/deviations: Decreased step length - left, Decreased stride length, Step-through pattern, Decreased step length - right, Narrow base of support, Trunk flexed, Drifts right/left Gait velocity: reduced     General Gait Details: pt leaving RW behind in room to look for slippers in cabinet, needing min A to maintain balance, agreeable to RW use for hallway distance, cues for RW proximity and winder BOS, pt unable to locate room on return and follow 2 step command (when you get to the black chair turn around) pt passing chair and needing cues for recall   Stairs             Wheelchair Mobility     Tilt Bed    Modified Rankin (Stroke Patients Only) Modified Rankin (Stroke Patients Only) Pre-Morbid Rankin Score: No symptoms Modified Rankin: Moderately severe disability     Balance Overall balance assessment: Needs assistance Sitting-balance support: Feet supported Sitting balance-Leahy Scale: Fair Sitting  balance - Comments: Pt able to sit statically with supervision  EOB unsupported.   Standing balance support: Bilateral upper  extremity supported, During functional activity, Reliant on assistive device for balance, No upper extremity supported Standing balance-Leahy Scale: Poor Standing balance comment: Reliant on RW and up to [employer] Arousal: Alert Behavior During Therapy: Flat affect Overall Cognitive Status: Impaired/Different from baseline Area of Impairment: Orientation, Attention, Problem solving, Awareness, Memory, Following commands, Safety/judgement                 Orientation Level: Disoriented to, Place, Time, Situation Current Attention Level: Sustained Memory: Decreased short-term memory, Decreased recall of precautions Following Commands: Follows one step commands with increased time, Follows one step commands consistently, Follows multi-step commands with increased time Safety/Judgement: Decreased awareness of safety, Decreased awareness of deficits Awareness: Emergent Problem Solving: Slow processing, Difficulty sequencing, Requires verbal cues General Comments: Pt initially believed he was in [place] and was unaware he had surgery. As session progressed, pt appeared to become less confused and acknowledged recall of his confusion during this admission stating "sometimes I get confused"  able to follow all single step commands unable to follow mutli step commands        Exercises Other Exercises Other Exercises: pt able to erase #s on mirror in descending order from 9-1 with 0 mistakes,    General Comments General comments (skin integrity, edema, etc.): VSS on RA      Pertinent Vitals/Pain Pain Assessment Pain Assessment: No/denies pain Pain Intervention(s): Monitored during session    Home Living                          Prior Function            PT Goals (current goals can now be found in the care plan section) Acute Rehab PT Goals Patient Stated Goal: to improve PT Goal Formulation: With patient Time For  Goal Achievement: 06/26/23 Progress towards PT goals: Progressing toward goals    Frequency    Min 1X/week      PT Plan      Co-evaluation              AM-PAC PT "6 Clicks" Mobility   Outcome Measure  Help needed turning from your back to your side while in a flat bed without using bedrails?: A Little Help needed moving from lying on your back to sitting on the side of a flat bed without using bedrails?: A Little Help needed moving to and from a bed to a chair (including a wheelchair)?: A Little Help needed standing up from a chair using your arms (e.g., wheelchair or bedside chair)?: A Little Help needed to walk in hospital room?: A Little Help needed climbing 3-5 steps with a railing? : A Lot 6 Click Score: 17    End of Session Equipment Utilized During Treatment: Gait belt Activity Tolerance: Patient tolerated treatment well Patient left: in chair;with call bell/phone within reach;with chair alarm set Nurse Communication: Mobility status PT Visit Diagnosis: Unsteadiness on feet (R26.81);Other abnormalities of gait and mobility (R26.89);Difficulty in walking, not elsewhere classified (R26.2);Other symptoms and signs involving the nervous system (R29.898);Muscle weakness (generalized) (M62.81)     Time: 1610-9604 PT Time Calculation (min) (ACUTE ONLY): 23 min  Charges:    $Gait Training:  8-22 mins $Therapeutic Activity: 8-22 mins PT General Charges $$ ACUTE PT VISIT: 1 Visit                     Linwood Gullikson R. PTA Acute Rehabilitation Services Office: [phone]   Catalina Antigua 06/18/2023, 2:37 PM

## 2023-06-18 NOTE — Plan of Care (Signed)
[facility] Tonsillectomy/Adenoidectomy, Postoperative PEDS care plan entered in error.

## 2023-06-19 MED ORDER — BUTALBITAL-APAP-CAFFEINE 50-325-40 MG PO TABS
1.0000 | ORAL_TABLET | ORAL | Status: DC | PRN
  Administered 2023-06-19 – 2023-06-21 (×2): 1 via ORAL
  Filled 2023-06-19 (×2): qty 1

## 2023-06-19 NOTE — Plan of Care (Signed)
  Problem: Skin Integrity: Goal: Demonstration of wound healing without infection will improve Outcome: Progressing   Problem: Education: Goal: Knowledge of General Education information will improve Description: Including pain rating scale, medication(s)/side effects and non-pharmacologic comfort measures Outcome: Progressing   Problem: Clinical Measurements: Goal: Respiratory complications will improve Outcome: Progressing Goal: Cardiovascular complication will be avoided Outcome: Progressing   Problem: Activity: Goal: Risk for activity intolerance will decrease Outcome: Progressing   Problem: Nutrition: Goal: Adequate nutrition will be maintained Outcome: Progressing

## 2023-06-19 NOTE — Plan of Care (Signed)
  Problem: Education: Goal: Understanding of CV disease, CV risk reduction, and recovery process will improve Outcome: Progressing   Problem: Education: Goal: Individualized Educational Video(s) Outcome: Progressing   Problem: Cardiovascular: Goal: Vascular access site(s) Level 0-1 will be maintained Outcome: Progressing   Problem: Health Behavior/Discharge Planning: Goal: Ability to safely manage health-related needs after discharge will improve Outcome: Progressing   Problem: Education: Goal: Knowledge of the prescribed therapeutic regimen will improve Outcome: Progressing   Problem: Activity: Goal: Ability to return to baseline activity level will improve Outcome: Progressing

## 2023-06-19 NOTE — Progress Notes (Signed)
Patient ID: Brian [name], male   DOB: Jan 22, 1956, 67 y.o.   MRN: [id number] Patient is awake and alert with much more animation today. Feels better after shower. Motor function is good but cognitive function is down... will likely need some rehab. Headache is still problematic will add Fioricet.

## 2023-06-19 NOTE — Progress Notes (Signed)
Occupational Therapy Treatment Patient Details Name: Brian [name] MRN: [id number] DOB: [birth year] Today's Date: 06/19/2023   History of present illness Pt is a 67 y.o. male who presented 06/08/23 for left frontotemporal craniotomy for clipping of anterior communicating artery aneurysm. Hospital course complicated by pt agitation and afib with RVR. CT head 10/9 showed mild to moderate bifrontal pneumocephalus and small volume of extra-axial hemorrhage with mild mass effect on the frontal lobes without midline shift. PMH: OSA (not using the CPAP), HTN, diet controlled DM-2, tobacco smoking for 52 yrs, GERD, anxiety, depression, CVA, seizure disorder, and CKD-3b   OT comments  Patient demonstrating good gains with OT treatment, meeting grooming and toilet transfer goals. Patient able to follow one step commands 75% of time and continues to require frequent cues for safety. Patient will benefit from intensive inpatient follow up therapy, >3 hours/day to increase safety and independence with functional transfers and self care. Acute OT to continue to follow.       If plan is discharge home, recommend the following:  Direct supervision/assist for medications management;Help with stairs or ramp for entrance;Direct supervision/assist for financial management;Assistance with cooking/housework;Assist for transportation;A little help with walking and/or transfers;A little help with bathing/dressing/bathroom   Equipment Recommendations  Other (comment) (TBD next venue of care)    Recommendations for Other Services Rehab consult    Precautions / Restrictions Precautions Precautions: Fall Restrictions Weight Bearing Restrictions: No       Mobility Bed Mobility Overal bed mobility: Needs Assistance Bed Mobility: Supine to Sit     Supine to sit: Supervision     General bed mobility comments: supervision for safety    Transfers Overall transfer level: Needs assistance Equipment used:  Rolling walker (2 wheels) Transfers: Sit to/from Stand Sit to Stand: Contact guard assist           General transfer comment: CGA for safety     Balance Overall balance assessment: Needs assistance Sitting-balance support: Feet supported Sitting balance-Leahy Scale: Fair Sitting balance - Comments: able to donn shoes seated on EOB   Standing balance support: Single extremity supported, Bilateral upper extremity supported, No upper extremity supported, During functional activity Standing balance-Leahy Scale: Poor Standing balance comment: stood at sink for grooming tasks with one extremity to no UE support.                           ADL either performed or assessed with clinical judgement   ADL Overall ADL's : Needs assistance/impaired     Grooming: Wash/dry hands;Wash/dry face;Oral care;Supervision/safety;Standing;Cueing for sequencing Grooming Details (indicate cue type and reason): standing at sink             Lower Body Dressing: Minimal assistance;Sitting/lateral leans Lower Body Dressing Details (indicate cue type and reason): to donn shoes Toilet Transfer: Contact guard assist;Rolling walker (2 wheels) Toilet Transfer Details (indicate cue type and reason): simulated to recliner           General ADL Comments: frequent cues for safety and redirecting. performed clothing retrieval with RW and CGA    Extremity/Trunk Assessment              Vision       Perception     Praxis      Cognition Arousal: Alert Behavior During Therapy: Flat affect, Impulsive Overall Cognitive Status: Impaired/Different from baseline Area of Impairment: Orientation, Attention, Problem solving, Awareness, Memory, Following commands, Safety/judgement  Orientation Level: Disoriented to, Place, Time, Situation Current Attention Level: Sustained Memory: Decreased short-term memory, Decreased recall of precautions Following Commands: Follows  one step commands with increased time, Follows one step commands consistently, Follows multi-step commands with increased time Safety/Judgement: Decreased awareness of safety, Decreased awareness of deficits Awareness: Emergent Problem Solving: Slow processing, Difficulty sequencing, Requires verbal cues General Comments: Impulsive, attempting to ambulate without RW. Able to state he was in [place] but was not aware he was in hospital        Exercises      Shoulder Instructions       General Comments VSS on RA    Pertinent Vitals/ Pain       Pain Assessment Pain Assessment: No/denies pain Pain Intervention(s): Monitored during session  Home Living                                          Prior Functioning/Environment              Frequency  Min 1X/week        Progress Toward Goals  OT Goals(current goals can now be found in the care plan section)  Progress towards OT goals: Progressing toward goals  Acute Rehab OT Goals Patient Stated Goal: go outside OT Goal Formulation: With patient Time For Goal Achievement: 06/26/23 Potential to Achieve Goals: Good ADL Goals Pt Will Perform Grooming: with min assist;sitting Pt Will Transfer to Toilet: with mod assist;bedside commode;stand pivot transfer Additional ADL Goal #1: Pt will maintain sustained attention for 10 mins with no more than mod instructional cueing for re-direction during selfcare tasks. Additional ADL Goal #2: Pt will follow one step commands with 75% accuracy related to selfcare, bed mobility, and transfers. Additional ADL Goal #3: Pt will demonstrate intellectual awareness of situation and deficits with mod questioning cueing.  Plan      Co-evaluation                 AM-PAC OT "6 Clicks" Daily Activity     Outcome Measure   Help from another person eating meals?: A Little Help from another person taking care of personal grooming?: A Little Help from another person  toileting, which includes using toliet, bedpan, or urinal?: A Little Help from another person bathing (including washing, rinsing, drying)?: A Little Help from another person to put on and taking off regular upper body clothing?: A Little Help from another person to put on and taking off regular lower body clothing?: A Little 6 Click Score: 18    End of Session Equipment Utilized During Treatment: Gait belt;Rolling walker (2 wheels)  OT Visit Diagnosis: Unsteadiness on feet (R26.81);Other abnormalities of gait and mobility (R26.89);Muscle weakness (generalized) (M62.81);Other symptoms and signs involving cognitive function   Activity Tolerance Patient tolerated treatment well   Patient Left in chair;with call bell/phone within reach;with chair alarm set   Nurse Communication Mobility status        Time: [phone] OT Time Calculation (min): 25 min  Charges: OT General Charges $OT Visit: 1 Visit OT Treatments $Self Care/Home Management : 8-22 mins $Therapeutic Activity: 8-22 mins  Alfonse Flavors, OTA Acute Rehabilitation Services  Office [phone]   Dewain Penning 06/19/2023, 9:45 AM

## 2023-06-20 NOTE — Progress Notes (Signed)
Patient ID: Brian [name], male   DOB: 10/23/1955, 67 y.o.   MRN: [id number] Alert and mobile  Flap is dry and intact Asked today when he might be able to go home Given his improvement in functional status discharge home may be a possibility Staples out 12 days post op

## 2023-06-20 NOTE — Plan of Care (Signed)
  Problem: Education: Goal: Understanding of CV disease, CV risk reduction, and recovery process will improve Outcome: Progressing Goal: Individualized Educational Video(s) Outcome: Progressing   Problem: Activity: Goal: Ability to return to baseline activity level will improve Outcome: Progressing   Problem: Cardiovascular: Goal: Ability to achieve and maintain adequate cardiovascular perfusion will improve Outcome: Progressing Goal: Vascular access site(s) Level 0-1 will be maintained Outcome: Progressing   Problem: Health Behavior/Discharge Planning: Goal: Ability to safely manage health-related needs after discharge will improve Outcome: Progressing   Problem: Education: Goal: Knowledge of the prescribed therapeutic regimen will improve Outcome: Progressing   Problem: Clinical Measurements: Goal: Usual level of consciousness will be regained or maintained. Outcome: Progressing Goal: Neurologic status will improve Outcome: Progressing Goal: Ability to maintain intracranial pressure will improve Outcome: Progressing   Problem: Skin Integrity: Goal: Demonstration of wound healing without infection will improve Outcome: Progressing   Problem: Safety: Goal: Non-violent Restraint(s) Outcome: Progressing   Problem: Education: Goal: Knowledge of General Education information will improve Description: Including pain rating scale, medication(s)/side effects and non-pharmacologic comfort measures Outcome: Progressing   Problem: Health Behavior/Discharge Planning: Goal: Ability to manage health-related needs will improve Outcome: Progressing   Problem: Clinical Measurements: Goal: Ability to maintain clinical measurements within normal limits will improve Outcome: Progressing Goal: Will remain free from infection Outcome: Progressing Goal: Diagnostic test results will improve Outcome: Progressing Goal: Respiratory complications will improve Outcome: Progressing Goal:  Cardiovascular complication will be avoided Outcome: Progressing   Problem: Activity: Goal: Risk for activity intolerance will decrease Outcome: Progressing   Problem: Nutrition: Goal: Adequate nutrition will be maintained Outcome: Progressing   Problem: Coping: Goal: Level of anxiety will decrease Outcome: Progressing   Problem: Elimination: Goal: Will not experience complications related to bowel motility Outcome: Progressing Goal: Will not experience complications related to urinary retention Outcome: Progressing   Problem: Pain Managment: Goal: General experience of comfort will improve Outcome: Progressing   Problem: Safety: Goal: Ability to remain free from injury will improve Outcome: Progressing   Problem: Skin Integrity: Goal: Risk for impaired skin integrity will decrease Outcome: Progressing

## 2023-06-21 NOTE — Progress Notes (Signed)
Physical Therapy Treatment Patient Details Name: Brian [name] MRN: [id number] DOB: 1955/10/18 Today's Date: 06/21/2023   History of Present Illness Pt is a 67 y.o. male who presented 06/08/23 for left frontotemporal craniotomy for clipping of anterior communicating artery aneurysm. Hospital course complicated by pt agitation and afib with RVR. CT head 10/9 showed mild to moderate bifrontal pneumocephalus and small volume of extra-axial hemorrhage with mild mass effect on the frontal lobes without midline shift. PMH: OSA (not using the CPAP), HTN, diet controlled DM-2, tobacco smoking for 52 yrs, GERD, anxiety, depression, CVA, seizure disorder, and CKD-3b    PT Comments  Pt tolerates treatment well, but continues to demonstrate significant deficits in function. Pt participates in dynamic balance and gait tasks, demonstrating a tendency to drift to R side and with multiple instances of staggering losses of balance with dynamic standing balance tasks. Pt has very poor cognition currently, only oriented to person at this time. Pt is unable to report he is at a hospital. He believes he fell and hit his head and had to have surgery to repair the damage. Pt has absolutely no recall of using an assistive device to improve safety at any point during this admission, and does not elect to utilize one today when mobilizing. Pt has some awareness of his deficits, reporting that his balance has been off since surgery, but is not aware enough to not mobilize unassisted at this time. Due to his lack of awareness and continued balance deficits the pt remains at a high risk for falls and injury. PT provides cues during session to call before getting up so a staff member can assist. Immediately after providing these cues the pt stands up and walks to the PT to shake hands as this PT is leaving the room. The pt reports the chair alarm is very inconvenient because it won't alarm for hours and then he stands to go to the  bathroom and it alarms. PT attempts to explain that it will only alarm if the patient stands, however he has difficulty grasping this concept. This PT is concerned that the pt does not have adequate caregiver support from his 73 year old mother, given the pt often assists her at baseline. PT continues to recommend short term inpatient PT services at the time of discharge.    If plan is discharge home, recommend the following: A little help with walking and/or transfers;A little help with bathing/dressing/bathroom;Assistance with cooking/housework;Direct supervision/assist for medications management;Direct supervision/assist for financial management;Assist for transportation;Help with stairs or ramp for entrance;Supervision due to cognitive status   Can travel by private vehicle     Yes  Equipment Recommendations  Rolling walker (2 wheels);Other (comment) (shower chair)    Recommendations for Other Services       Precautions / Restrictions Precautions Precautions: Fall Precaution Comments: significant cognitive impairment Restrictions Weight Bearing Restrictions: No     Mobility  Bed Mobility                    Transfers Overall transfer level: Needs assistance Equipment used: None Transfers: Sit to/from Stand Sit to Stand: Supervision           General transfer comment: pt impulsively stands from recliner multiple times during session despite PT cues to initially wait for PT to don gloves, and then cues later to call for help anytime needing to get up    Ambulation/Gait Ambulation/Gait assistance: Min assist Gait Distance (Feet): 300 Feet Assistive device: None (pt does  not demonstrate recall for the need to utilize DME) Gait Pattern/deviations: Step-through pattern, Drifts right/left, Staggering left Gait velocity: functional Gait velocity interpretation: 1.31 - 2.62 ft/sec, indicative of limited community ambulator   General Gait Details: pt participates in  multiple dynamic gait tasks. Pt demonstrates frequent R lateral drift when ambulating without challenges and with head turns. Pt requires multiple cues to follow multi-step directions, often perseverating on previous instruction. Pt requires adjustment of gait pattern to step over objects.   Stairs Stairs: Yes Stairs assistance: Contact guard assist Stair Management: One rail Right, Step to pattern, Forwards Number of Stairs: 3     Wheelchair Mobility     Tilt Bed    Modified Rankin (Stroke Patients Only)       Balance Overall balance assessment: Needs assistance Sitting-balance support: No upper extremity supported, Feet supported Sitting balance-Leahy Scale: Good     Standing balance support: No upper extremity supported, During functional activity Standing balance-Leahy Scale: Fair       Tandem Stance - Right Leg: 15 (staggering to left at end of attempt requiring PT assist to correct) Tandem Stance - Left Leg: 10   Rhomberg - Eyes Closed: 30   High Level Balance Comments: pt bends over to pick up duffel bag from floor when standing, closes it and slides in under arm chair            Cognition Arousal: Alert Behavior During Therapy: Impulsive Overall Cognitive Status: Impaired/Different from baseline Area of Impairment: Orientation, Attention, Memory, Following commands, Safety/judgement, Awareness, Problem solving                 Orientation Level: Disoriented to, Place, Time, Situation Current Attention Level: Sustained Memory: Decreased recall of precautions, Decreased short-term memory Following Commands: Follows one step commands consistently, Follows multi-step commands inconsistently, Follows multi-step commands with increased time Safety/Judgement: Decreased awareness of safety, Decreased awareness of deficits Awareness: Emergent Problem Solving: Difficulty sequencing, Requires verbal cues          Exercises      General Comments  General comments (skin integrity, edema, etc.): VSS on RA      Pertinent Vitals/Pain Pain Assessment Pain Assessment: No/denies pain    Home Living                          Prior Function            PT Goals (current goals can now be found in the care plan section) Acute Rehab PT Goals Patient Stated Goal: to improve PT Goal Formulation: With patient Time For Goal Achievement: 07/05/23 Potential to Achieve Goals: Fair Additional Goals Additional Goal #1: Pt will score >19/24 on the DGI to indicate a reduced risk for falls Progress towards PT goals: Goals met and updated - see care plan    Frequency    Min 1X/week      PT Plan      Co-evaluation              AM-PAC PT "6 Clicks" Mobility   Outcome Measure  Help needed turning from your back to your side while in a flat bed without using bedrails?: A Little Help needed moving from lying on your back to sitting on the side of a flat bed without using bedrails?: A Little Help needed moving to and from a bed to a chair (including a wheelchair)?: A Little Help needed standing up from a chair using your arms (e.g.,  wheelchair or bedside chair)?: A Little Help needed to walk in hospital room?: A Little Help needed climbing 3-5 steps with a railing? : A Little 6 Click Score: 18    End of Session Equipment Utilized During Treatment: Gait belt Activity Tolerance: Patient tolerated treatment well Patient left: in chair;with call bell/phone within reach;with chair alarm set Nurse Communication: Mobility status PT Visit Diagnosis: Unsteadiness on feet (R26.81);Other abnormalities of gait and mobility (R26.89);Difficulty in walking, not elsewhere classified (R26.2);Other symptoms and signs involving the nervous system (R29.898);Muscle weakness (generalized) (M62.81)     Time: 8119-1478 PT Time Calculation (min) (ACUTE ONLY): 19 min  Charges:    $Gait Training: 8-22 mins PT General Charges $$ ACUTE PT  VISIT: 1 Visit                     Arlyss Gandy, PT, DPT Acute Rehabilitation Office [phone]    Arlyss Gandy 06/21/2023, 12:20 PM

## 2023-06-21 NOTE — Plan of Care (Signed)
  Problem: Education: Goal: Understanding of CV disease, CV risk reduction, and recovery process will improve Outcome: Progressing Goal: Individualized Educational Video(s) Outcome: Progressing   Problem: Activity: Goal: Ability to return to baseline activity level will improve Outcome: Progressing   Problem: Cardiovascular: Goal: Ability to achieve and maintain adequate cardiovascular perfusion will improve Outcome: Progressing Goal: Vascular access site(s) Level 0-1 will be maintained Outcome: Progressing   Problem: Health Behavior/Discharge Planning: Goal: Ability to safely manage health-related needs after discharge will improve Outcome: Progressing   Problem: Education: Goal: Knowledge of the prescribed therapeutic regimen will improve Outcome: Progressing   Problem: Clinical Measurements: Goal: Usual level of consciousness will be regained or maintained. Outcome: Progressing Goal: Neurologic status will improve Outcome: Progressing Goal: Ability to maintain intracranial pressure will improve Outcome: Progressing   Problem: Skin Integrity: Goal: Demonstration of wound healing without infection will improve Outcome: Progressing   Problem: Safety: Goal: Non-violent Restraint(s) Outcome: Progressing   Problem: Education: Goal: Knowledge of General Education information will improve Description: Including pain rating scale, medication(s)/side effects and non-pharmacologic comfort measures Outcome: Progressing   Problem: Health Behavior/Discharge Planning: Goal: Ability to manage health-related needs will improve Outcome: Progressing   Problem: Clinical Measurements: Goal: Ability to maintain clinical measurements within normal limits will improve Outcome: Progressing Goal: Will remain free from infection Outcome: Progressing Goal: Diagnostic test results will improve Outcome: Progressing Goal: Respiratory complications will improve Outcome: Progressing Goal:  Cardiovascular complication will be avoided Outcome: Progressing   Problem: Activity: Goal: Risk for activity intolerance will decrease Outcome: Progressing   Problem: Nutrition: Goal: Adequate nutrition will be maintained Outcome: Progressing   Problem: Coping: Goal: Level of anxiety will decrease Outcome: Progressing   Problem: Elimination: Goal: Will not experience complications related to bowel motility Outcome: Progressing Goal: Will not experience complications related to urinary retention Outcome: Progressing   Problem: Pain Managment: Goal: General experience of comfort will improve Outcome: Progressing   Problem: Safety: Goal: Ability to remain free from injury will improve Outcome: Progressing   Problem: Skin Integrity: Goal: Risk for impaired skin integrity will decrease Outcome: Progressing

## 2023-06-21 NOTE — Progress Notes (Signed)
  NEUROSURGERY PROGRESS NOTE   No issues overnight.   EXAM:  BP 115/64   Pulse 66   Temp 98 F (36.7 C) (Oral)   Resp 18   Ht 6' (1.829 m)   Wt 90.7 kg   SpO2 99%   BMI 27.12 kg/m   Awake, alert, oriented Speech fluent, appropriate CN grossly intact 5/5 BUE/BLE Wound c/d/i  IMPRESSION:  67 y.o. male  s/p elective Acom clipping. Significant postoperative confusion/delirium is significantly improved.  Afib RVR in ICU resolved Baseline SZ disorder is stable  PLAN: - Cont PT/OT. If he cont to improve, could consider d/c home vs SNF placement - Cont Norvasc - Cont home Topamax  Lisbeth Renshaw, MD [facility]

## 2023-06-21 NOTE — Progress Notes (Signed)
Occupational Therapy Treatment Patient Details Name: Brian [name] MRN: [id number] DOB: 08/02/1956 Today's Date: 06/21/2023   History of present illness Pt is a 67 y.o. male who presented 06/08/23 for left frontotemporal craniotomy for clipping of anterior communicating artery aneurysm. Hospital course complicated by pt agitation and afib with RVR. CT head 10/9 showed mild to moderate bifrontal pneumocephalus and small volume of extra-axial hemorrhage with mild mass effect on the frontal lobes without midline shift. PMH: OSA (not using the CPAP), HTN, diet controlled DM-2, tobacco smoking for 52 yrs, GERD, anxiety, depression, CVA, seizure disorder, and CKD-3b   OT comments  Pt is making good physical progress but remains significantly limited by cognition. Pt required maximal cues to identify his current situation as he was perseverating on being in the hospital due to a flu shot and abdominal pain. He also needed maximal cues and significantly increased time to verbalize returning to driving would not be safe; however after education and awareness pt then stated he could drive his mom to her appointments if he went home today. Pt also needed approximately ~5 minutes to recall the emergency number of "911." Throughout session he was impulsive with movement and noted to become more unsteady when attempting to dual task, he has little to no awareness of use of DME. OT to continue to follow acutely to facilitate progress towards established goals. Pt will require 24/7 direct superivsion A for safety, there recommend skilled inpatient follow up therapy, <3 hours/day.       If plan is discharge home, recommend the following:  Direct supervision/assist for medications management;Help with stairs or ramp for entrance;Direct supervision/assist for financial management;Assistance with cooking/housework;Assist for transportation;A little help with walking and/or transfers;A little help with  bathing/dressing/bathroom   Equipment Recommendations  None recommended by OT       Precautions / Restrictions Precautions Precautions: Fall Precaution Comments: significant cognitive impairment Restrictions Weight Bearing Restrictions: No       Mobility Bed Mobility               General bed mobility comments: OOB upon arrival    Transfers Overall transfer level: Needs assistance Equipment used: None Transfers: Sit to/from Stand Sit to Stand: Supervision           General transfer comment: supervision for initial stand, GCA for functional mobility     Balance Overall balance assessment: Needs assistance Sitting-balance support: No upper extremity supported, Feet supported Sitting balance-Leahy Scale: Good     Standing balance support: No upper extremity supported, During functional activity Standing balance-Leahy Scale: Fair                             ADL either performed or assessed with clinical judgement   ADL Overall ADL's : Needs assistance/impaired                         Toilet Transfer: Contact guard assist             General ADL Comments: cues needed for initiation, safety, attention and redirection to tasks. Pt self-distracting    Extremity/Trunk Assessment Upper Extremity Assessment Upper Extremity Assessment: Overall WFL for tasks assessed   Lower Extremity Assessment Lower Extremity Assessment: Defer to PT evaluation        Vision   Vision Assessment?: No apparent visual deficits Additional Comments: seemingly WFL, did not formally assess   Perception Perception Perception: Not tested  Praxis Praxis Praxis: Not tested    Cognition Arousal: Alert Behavior During Therapy: Impulsive Overall Cognitive Status: Impaired/Different from baseline Area of Impairment: Orientation, Attention, Following commands, Memory, Safety/judgement, Awareness, Problem solving                 Orientation  Level: Disoriented to, Situation, Time Current Attention Level: Sustained Memory: Decreased short-term memory, Decreased recall of precautions Following Commands: Follows one step commands consistently Safety/Judgement: Decreased awareness of safety, Decreased awareness of deficits Awareness: Emergent Problem Solving: Slow processing, Requires verbal cues General Comments: significntly impaired cognition, limited insight to situation. pt perseverated on being "sick" with abdominal pain and having a flu shot despite reorientation to head/brain surgery. Pt had a lot of difficulty verbalizing his thoughts. Pt initially did state it would not be safe for him to drive if he were given keys, but then proceeded to state he could drive his mom to appointments. Pt needed increased time and cues to recall "911" as the emergcy call number.              General Comments VSS on RA    Pertinent Vitals/ Pain       Pain Assessment Pain Assessment: No/denies pain   Progress Toward Goals  OT Goals(current goals can now be found in the care plan section)  Progress towards OT goals: Progressing toward goals  Acute Rehab OT Goals Patient Stated Goal: to go home OT Goal Formulation: With patient Time For Goal Achievement: 07/05/23 Potential to Achieve Goals: Good ADL Goals Pt Will Perform Grooming: Independently;standing Pt Will Transfer to Toilet: Independently;ambulating Additional ADL Goal #1: Pt will demonstrate improved sustained attention to complete at least 3 ADLs in a row without cues for re-direction Additional ADL Goal #2: Pt will complete IADL medicaiton management task with min cues for accuracy Additional ADL Goal #3: Pt will independently identify at least 2 limitations/deficits he has to demonstrate improved intellectual awareness   AM-PAC OT "6 Clicks" Daily Activity     Outcome Measure   Help from another person eating meals?: A Little Help from another person taking care of  personal grooming?: A Little Help from another person toileting, which includes using toliet, bedpan, or urinal?: A Little Help from another person bathing (including washing, rinsing, drying)?: A Little Help from another person to put on and taking off regular upper body clothing?: A Little Help from another person to put on and taking off regular lower body clothing?: A Little 6 Click Score: 18    End of Session    OT Visit Diagnosis: Unsteadiness on feet (R26.81);Other abnormalities of gait and mobility (R26.89);Muscle weakness (generalized) (M62.81);Other symptoms and signs involving cognitive function Pain - Right/Left: Right   Activity Tolerance Patient tolerated treatment well   Patient Left in chair;with call bell/phone within reach;with chair alarm set   Nurse Communication Mobility status        Time: 4098-1191 OT Time Calculation (min): 19 min  Charges: OT General Charges $OT Visit: 1 Visit OT Treatments $Self Care/Home Management : 8-22 mins  Derenda Mis, OTR/L Acute Rehabilitation Services Office [phone] Secure Chat Communication Preferred   Brian [name] 06/21/2023, 2:48 PM

## 2023-06-21 NOTE — TOC Progression Note (Signed)
Transition of Care [facility]) - Progression Note    Patient Details  Name: Brian [name] MRN: [id number] Date of Birth: 1956-03-22  [facility]) CM/SW Contact  Baldemar Lenis, [place] Phone Number: 06/21/2023, 4:00 PM  Clinical Narrative:   CSW spoke with daughter, [name], to discuss SNF offers. Daughter asking for something in the [place] area, CSW discussed options and daughter chose [facility]. CSW confirmed with Leway[facility] Bunting that they have a bed available and requested CMA to initiate insurance authorization. CSW to follow.    Expected Discharge Plan: Skilled Nursing Facility Barriers to Discharge: Continued Medical Work up, English as a second language teacher, SNF Pending bed offer  Expected Discharge Plan and Services In-house Referral: Clinical Social Work     Living arrangements for the past 2 months: Single Family Home                                       Social Determinants of Health (SDOH) Interventions SDOH Screenings   Depression (PHQ2-9): High Risk (04/01/2023)  Tobacco Use: High Risk (06/08/2023)    Readmission Risk Interventions     No data to display

## 2023-06-22 NOTE — Progress Notes (Signed)
  NEUROSURGERY PROGRESS NOTE   No issues overnight, no complaints this am.  EXAM:  BP 111/62 (BP Location: Left Arm)   Pulse 62   Temp 98 F (36.7 C) (Oral)   Resp 19   Ht 6' (1.829 m)   Wt 90.7 kg   SpO2 100%   BMI 27.12 kg/m   Awake, alert, oriented Speech fluent, appropriate CN grossly intact 5/5 BUE/BLE Wound c/d/i  IMPRESSION:  67 y.o. male  s/p elective Acom clipping, improving Afib under control Baseline SZ disorder  PLAN: - Cont PT/OT, plan on CIR - Cont norvasc - Cont topamax   Lisbeth Renshaw, MD [facility]

## 2023-06-22 NOTE — TOC Progression Note (Signed)
Transition of Care [facility]) - Progression Note    Patient Details  Name: KINKADE JACO MRN: [id number] Date of Birth: 14-Sep-1955  [facility] Care [facility]) CM/SW Contact  Baldemar Lenis, [place] Phone Number: 06/22/2023, 3:56 PM  Clinical Narrative:   Patient insurance is still pending. CSW to follow.    Expected Discharge Plan: Skilled Nursing Facility Barriers to Discharge: Continued Medical Work up, English as a second language teacher, SNF Pending bed offer  Expected Discharge Plan and Services In-house Referral: Clinical Social Work     Living arrangements for the past 2 months: Single Family Home                                       Social Determinants of Health (SDOH) Interventions SDOH Screenings   Depression (PHQ2-9): High Risk (04/01/2023)  Tobacco Use: High Risk (06/08/2023)    Readmission Risk Interventions     No data to display

## 2023-06-23 LAB — TROPONIN I (HIGH SENSITIVITY): Troponin I (High Sensitivity): 11 ng/L (ref <18)

## 2023-06-23 NOTE — Progress Notes (Signed)
Patient reporting chest pain, that has been there for 3 days. Obtained vitals and EKG. Notified MD in his office.
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
## 2023-06-23 NOTE — TOC Progression Note (Signed)
Transition of Care [facility]) - Progression Note    Patient Details  Name: SELWYN VITO MRN: [id number] Date of Birth: Nov 11, 1955  Transition [facility] Care [facility]) CM/SW Contact  Baldemar Lenis, [place] Phone Number: 06/23/2023, 1:25 PM  Clinical Narrative:   CSW received call from patient's insurance for peer to peer review. CSW updated MD, he would like to participate. CSW attempted to call and schedule, the MD will have to call. CSW passed information on to MD, deadline is 4:00 pm today. CSW to follow.  UPDATE: CMA contacted insurance provider as the MD was in clinic until 4pm, deadline for peer to peer extended to 9 am tomorrow. CSW updated MD.    Expected Discharge Plan: Skilled Nursing Facility Barriers to Discharge: English as a second language teacher, Continued Medical Work up  Expected Discharge Plan and Services In-house Referral: Clinical Social Work     Living arrangements for the past 2 months: Single Family Home                                       Social Determinants of Health (SDOH) Interventions SDOH Screenings   Depression (PHQ2-9): High Risk (04/01/2023)  Tobacco Use: High Risk (06/08/2023)    Readmission Risk Interventions     No data to display
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
## 2023-06-23 NOTE — Progress Notes (Addendum)
  NEUROSURGERY PROGRESS NOTE   Pt appears to have been agitated, disoriented early this am, no complaints currently.   I have spoken to the patient's sister today, who reports that the patient over the last six months or longer has had episodes of confusion and delusions.  She also reports that he has had significant mood swings even prior to surgery.  She notes that it has been difficult for him to manage his money.  EXAM:  BP (!) 114/59 (BP Location: Right Arm)   Pulse 81   Temp 98.5 F (36.9 C) (Oral)   Resp 17   Ht 6' (1.829 m)   Wt 90.7 kg   SpO2 99%   BMI 27.12 kg/m   Awake, alert, oriented Speech fluent, appropriate CN grossly intact 5/5 BUE/BLE Wound c/d/i  IMPRESSION:  67 y.o. male  s/p elective Acom clipping, improving. 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 Afib under control Baseline SZ disorder  PLAN: - Cont PT/OT, may need SNF placement if insurance auth - Cont norvasc - Cont topamax - Will d/c all narcotics  - May consider psychiatry consult - spoke to patient who states he is willing to speak to a psychiatrist/mental health professional.   Lisbeth Renshaw, MD [facility]

## 2023-06-23 NOTE — Progress Notes (Signed)
At approximately 0530 I was called to check on Mr Brian [name] because he was in the shower alone. I entered the room with Ramona, NT and asked Mr Brian [name] if he would come out of the shower he asked why I explained it is not safe for him to shower alone and he also needs an order to shower, at that time he raised his voice and said the stiches came out 2 days ago and I do have an order to shower, I said ok but can you come out, because we want to make sure you do not fall. At that time he screamed let me rinse off and I will I know what I am doing. Mr Brian [name] stayed in the shower another 5 minuets and proceed to yell at staff upon stepping out of the shower, demanding a towel and then aggressively slamming the door, at this time [name], CN was also in the room due to [name] refusing to get out of the shower, she also attempted to calm him down while explaining we all are trying to keep him from falling. He continued to yell and stated get out of my room I have had enough of you this week (tonight was my first night ever taking care of [name]) you are talking to me like a dog and I don't want you in my room. At that time I left his room Ramona and [name] were still in the room with him, as I was leaving his room to enter room 20 [name] started yelling again and aggressively walking towards me [name] and Ramona told him to go back in his room and he told them to get out of his room they both existed his room and Heather closed his door to prevent further outburst. [name] bed alarm did not sound when he got up to shower, he disabled the alarm (per another NT, she stated he knows how dis-alarm the bed alarm). While he was in the bathroom he pulled the emergency cord which alerted [place] he was in the bathroom. I administered his 0600 meds at 0507 at that time he was in bed and the bed alarm was on, all three lights were on.

## 2023-06-23 NOTE — Plan of Care (Signed)
  Problem: Education: Goal: Understanding of CV disease, CV risk reduction, and recovery process will improve Outcome: Progressing Goal: Individualized Educational Video(s) Outcome: Progressing   Problem: Activity: Goal: Ability to return to baseline activity level will improve Outcome: Progressing   Problem: Cardiovascular: Goal: Ability to achieve and maintain adequate cardiovascular perfusion will improve Outcome: Progressing Goal: Vascular access site(s) Level 0-1 will be maintained Outcome: Progressing   Problem: Health Behavior/Discharge Planning: Goal: Ability to safely manage health-related needs after discharge will improve Outcome: Progressing   Problem: Education: Goal: Knowledge of the prescribed therapeutic regimen will improve Outcome: Progressing   Problem: Clinical Measurements: Goal: Usual level of consciousness will be regained or maintained. Outcome: Progressing Goal: Neurologic status will improve Outcome: Progressing Goal: Ability to maintain intracranial pressure will improve Outcome: Progressing   Problem: Skin Integrity: Goal: Demonstration of wound healing without infection will improve Outcome: Progressing   Problem: Safety: Goal: Non-violent Restraint(s) Outcome: Progressing   Problem: Education: Goal: Knowledge of General Education information will improve Description: Including pain rating scale, medication(s)/side effects and non-pharmacologic comfort measures Outcome: Progressing   Problem: Health Behavior/Discharge Planning: Goal: Ability to manage health-related needs will improve Outcome: Progressing   Problem: Clinical Measurements: Goal: Ability to maintain clinical measurements within normal limits will improve Outcome: Progressing Goal: Will remain free from infection Outcome: Progressing Goal: Diagnostic test results will improve Outcome: Progressing Goal: Respiratory complications will improve Outcome: Progressing Goal:  Cardiovascular complication will be avoided Outcome: Progressing   Problem: Activity: Goal: Risk for activity intolerance will decrease Outcome: Progressing   Problem: Nutrition: Goal: Adequate nutrition will be maintained Outcome: Progressing   Problem: Coping: Goal: Level of anxiety will decrease Outcome: Progressing   Problem: Elimination: Goal: Will not experience complications related to bowel motility Outcome: Progressing Goal: Will not experience complications related to urinary retention Outcome: Progressing   Problem: Pain Managment: Goal: General experience of comfort will improve Outcome: Progressing   Problem: Safety: Goal: Ability to remain free from injury will improve Outcome: Progressing   Problem: Skin Integrity: Goal: Risk for impaired skin integrity will decrease Outcome: Progressing

## 2023-06-23 NOTE — TOC Progression Note (Signed)
Transition of Care [facility]) - Progression Note    Patient Details  Name: Brian [name] MRN: [id number] Date of Birth: [birth year]  [facility]) CM/SW Contact  Baldemar Lenis, [place] Phone Number: 06/23/2023, 10:33 AM  Clinical Narrative:   Patient's insurance is still pending at this time, has gone to [employer] for review. CSW to follow.    Expected Discharge Plan: Skilled Nursing Facility Barriers to Discharge: English as a second language teacher, Continued Medical Work up  Expected Discharge Plan and Services In-house Referral: Clinical Social Work     Living arrangements for the past 2 months: Single Family Home                                       Social Determinants of Health (SDOH) Interventions SDOH Screenings   Depression (PHQ2-9): High Risk (04/01/2023)  Tobacco Use: High Risk (06/08/2023)    Readmission Risk Interventions     No data to display

## 2023-06-23 NOTE — Progress Notes (Signed)
Patient still reporting chest pain, Notified MD and leadership. New order received.

## 2023-06-24 MED ORDER — ACETAMINOPHEN 325 MG PO TABS
650.0000 mg | ORAL_TABLET | Freq: Four times a day (QID) | ORAL | Status: DC | PRN
  Administered 2023-06-24 – 2023-07-05 (×19): 650 mg via ORAL
  Filled 2023-06-24 (×20): qty 2

## 2023-06-24 NOTE — Progress Notes (Signed)
Per report from nightshift RN, pt's PIV came out overnight. Left AC site now reddened with 2 white pinpoint lesions and pt c/o itching. Heat pack applied per pt request. Called office for Lisbeth Renshaw, MD and per Morrie Sheldon new PIV site not required at this time per Dr Conchita Paris. No new orders at this time.

## 2023-06-24 NOTE — TOC Progression Note (Signed)
Transition of Care [facility]) - Progression Note    Patient Details  Name: Brian [name] MRN: [id number] Date of Birth: 1955/11/01  Transition of Care [facility]) CM/SW Contact  Baldemar Lenis, [place] Phone Number: 06/24/2023, 7:51 PM  Clinical Narrative:   Patient was denied for SNF, peer to peer was not completed and insurance feels that patient is too high functioning to require SNF at this time. CSW contacted daughter, [name], to provide update that patient was denied SNF. [name] disagrees that the patient is high functioning and would like to appeal. CSW provided [name] with the number to call to appeal, and will send in supporting documentation after the appeal is initiated. CSW to follow.    Expected Discharge Plan: Skilled Nursing Facility Barriers to Discharge: English as a second language teacher, Continued Medical Work up  Expected Discharge Plan and Services In-house Referral: Clinical Social Work     Living arrangements for the past 2 months: Single Family Home                                       Social Determinants of Health (SDOH) Interventions SDOH Screenings   Depression (PHQ2-9): High Risk (04/01/2023)  Tobacco Use: High Risk (06/08/2023)    Readmission Risk Interventions     No data to display

## 2023-06-24 NOTE — Plan of Care (Signed)

## 2023-06-24 NOTE — Progress Notes (Signed)
  NEUROSURGERY PROGRESS NOTE   No issues overnight. Pt had some chest pain yesterday, unchanged today. otherwise well  EXAM:  BP (!) 141/59 (BP Location: Right Arm)   Pulse 64   Temp 98 F (36.7 C) (Oral)   Resp 17   Ht 6' (1.829 m)   Wt 90.7 kg   SpO2 97%   BMI 27.12 kg/m   Awake, alert, oriented  Speech fluent, appropriate  CN grossly intact  5/5 BUE/BLE   IMPRESSION:  67 y.o. male s/p elective Acom clipping. Well from neurosurgical standpoint, likely has baseline psychiatric disease  PLAN: - - will consult psych, otherwise stable for discharge   Lisbeth Renshaw, MD [facility]

## 2023-06-25 NOTE — Progress Notes (Signed)
Patient ID: Brian [name], male   DOB: 05-18-1956, 67 y.o.   MRN: [id number] Patient looks good today.  No headache.  Incision looks good.  Moving all extremities.  He is awake and alert and pleasant and conversant.  Awaiting psychiatry consult and then likely stable for discharge

## 2023-06-25 NOTE — Progress Notes (Signed)
Occupational Therapy Treatment Patient Details Name: Brian [name] MRN: [id number] DOB: 06/22/1956 Today's Date: 06/25/2023   History of present illness Pt is a 67 y.o. male who presented 06/08/23 for left frontotemporal craniotomy for clipping of anterior communicating artery aneurysm. Hospital course complicated by pt agitation and afib with RVR. CT head 10/9 showed mild to moderate bifrontal pneumocephalus and small volume of extra-axial hemorrhage with mild mass effect on the frontal lobes without midline shift. PMH: OSA (not using the CPAP), HTN, diet controlled DM-2, tobacco smoking for 52 yrs, GERD, anxiety, depression, CVA, seizure disorder, and CKD-3b   OT comments  Pt. Seen for skilled OT treatment session.  Cont. Work on awareness and insight into cognitive impairments.  Pt. Able to identify 2 tasks that would be unsafe to complete and describe rationale for why. Able to talk and describe being aware of what "yall are concerned about" but further explained that being around  his mom and also being available to be around his 2 grandchildren "would be good for me".  Did note during conversation x2 pt. Would have a [place]ota pause mid sentence and freeze with all movement.  Required verbal "sir" to get attention and he would almost shake head and then resume sentence. Notable word finding during speaking. Often repeating what I had said as his answer.  Agree with current d/c recommendations. Although some slight improvement with cognition he remains a need for direct S with all adls and iadls.        If plan is discharge home, recommend the following:  Direct supervision/assist for medications management;Help with stairs or ramp for entrance;Direct supervision/assist for financial management;Assistance with cooking/housework;Assist for transportation;A little help with walking and/or transfers;A little help with bathing/dressing/bathroom   Equipment Recommendations  None recommended by OT     Recommendations for Other Services Speech consult    Precautions / Restrictions Precautions Precautions: Fall Precaution Comments: significant cognitive impairment       Mobility Bed Mobility                    Transfers                         Balance                                           ADL either performed or assessed with clinical judgement   ADL                                              Extremity/Trunk Assessment              Vision       Perception     Praxis      Cognition Arousal: Alert Behavior During Therapy: WFL for tasks assessed/performed Overall Cognitive Status: Impaired/Different from baseline Area of Impairment: Orientation, Memory, Following commands, Safety/judgement, Awareness, Problem solving                   Current Attention Level: Sustained Memory: Decreased recall of precautions, Decreased short-term memory Following Commands: Follows one step commands consistently, Follows multi-step commands inconsistently Safety/Judgement: Decreased awareness of safety, Decreased awareness of deficits Awareness: Emergent Problem Solving: Slow processing, Decreased initiation,  Difficulty sequencing, Requires verbal cues General Comments: pt. describing working in [employer]. reports living outside of [place].  lists dtr. and her name heather. describes 2 g.children ages 59 and 26. states his dtr in law who he then further described as his dtrs. cousin and her husband are currently helping with the grandkids, said her name was amber and her spouse was [place]  also talking about how important it is to be able to help get g.kids off of the bus in the afternoons, how much it means to him.  also reviewed how he and his mother "help each other out".  states his mother manages his medications/pills for him.  states she gets to the grocery store and he goes with her to "help  out".  when asked 2 things he should not do due to safety concerns he replied "im not going to drive, i gave over my license its for the state of [place] to decide if i get it back" i asked what is a 2nd thing and he replied "not cook, because i could walk off leaving something boiling, i love to cook but i know i cant right now".  also asked in regards to the g.children if one was asking for help in the kitchen and one had just taken off out the front door who should he attend to/help first. he stated "the one that took off out front because you neve know whats gonna happen".  acknologed our concerns but continued to say he felt he will do better if he can be with his mother and be available for the grandkids.        Exercises      Shoulder Instructions       General Comments      Pertinent Vitals/ Pain       Pain Assessment Pain Assessment: No/denies pain  Home Living                                          Prior Functioning/Environment              Frequency  Min 1X/week        Progress Toward Goals  OT Goals(current goals can now be found in the care plan section)  Progress towards OT goals: Progressing toward goals     Plan      Co-evaluation                 AM-PAC OT "6 Clicks" Daily Activity     Outcome Measure   Help from another person eating meals?: A Little Help from another person taking care of personal grooming?: A Little Help from another person toileting, which includes using toliet, bedpan, or urinal?: A Little Help from another person bathing (including washing, rinsing, drying)?: A Little Help from another person to put on and taking off regular upper body clothing?: A Little Help from another person to put on and taking off regular lower body clothing?: A Little 6 Click Score: 18    End of Session    OT Visit Diagnosis: Unsteadiness on feet (R26.81);Other abnormalities of gait and mobility (R26.89);Muscle weakness  (generalized) (M62.81);Other symptoms and signs involving cognitive function Pain - Right/Left: Right   Activity Tolerance Patient tolerated treatment well   Patient Left in chair;with call bell/phone within reach   Nurse Communication Other (comment) (checked at the nurses  station reviewing i had seen him walking around and multiple staff confirm he does walk around in/out of the room)        Time: 1134-1150 OT Time Calculation (min): 16 min  Charges: OT General Charges $OT Visit: 1 Visit OT Treatments $Self Care/Home Management : 8-22 mins  Boneta Lucks, COTA/L Acute Rehabilitation [phone]   Alessandra Bevels Lorraine-COTA/L 06/25/2023, 12:47 PM

## 2023-06-25 NOTE — TOC Progression Note (Signed)
Transition of Care [facility]) - Progression Note    Patient Details  Name: Brian [name] MRN: [id number] Date of Birth: June 21, 1956  Transition of Care [facility]) CM/SW Contact  Michaela Corner, [place] Phone Number: 06/25/2023, 12:22 PM  Clinical Narrative:   CSW called daughter, she provided appeal case number [id number]. Phone # to [facility] appeal line is [phone] (click option 3). CSW obtained fax number for [employer] appeal [phone].    Expected Discharge Plan: Skilled Nursing Facility Barriers to Discharge: English as a second language teacher, Continued Medical Work up  Expected Discharge Plan and Services In-house Referral: Clinical Social Work     Living arrangements for the past 2 months: Single Family Home                                       Social Determinants of Health (SDOH) Interventions SDOH Screenings   Depression (PHQ2-9): High Risk (04/01/2023)  Tobacco Use: High Risk (06/08/2023)    Readmission Risk Interventions     No data to display

## 2023-06-25 NOTE — Progress Notes (Signed)
Physical Therapy Treatment Patient Details Name: Brian [name] MRN: [id number] DOB: Jan 25, 1956 Today's Date: 06/25/2023   History of Present Illness Pt is a 67 y.o. male who presented 06/08/23 for left frontotemporal craniotomy for clipping of anterior communicating artery aneurysm. Hospital course complicated by pt agitation and afib with RVR. CT head 10/9 showed mild to moderate bifrontal pneumocephalus and small volume of extra-axial hemorrhage with mild mass effect on the frontal lobes without midline shift. PMH: OSA (not using the CPAP), HTN, diet controlled DM-2, tobacco smoking for 52 yrs, GERD, anxiety, depression, CVA, seizure disorder, and CKD-3b    PT Comments  Further training on stairs, with supervision for safety. Gait training in hallway at supervision level while challenging with dynamic gait tasks as well as cognitive challenges. Distractions result in increased instability increasing fall risk. Pt able to self correct but required supervision and cues for awareness of surroundings and hazards. Patient will continue to benefit from skilled physical therapy services to further improve independence with functional mobility.     If plan is discharge home, recommend the following: Assistance with cooking/housework;Assist for transportation;Help with stairs or ramp for entrance;Supervision due to cognitive status;Direct supervision/assist for financial management;Direct supervision/assist for medications management   Can travel by private vehicle     Yes  Equipment Recommendations  Other (comment) (shower chair)    Recommendations for Other Services       Precautions / Restrictions Precautions Precautions: Fall Precaution Comments: significant cognitive impairment Restrictions Weight Bearing Restrictions: No     Mobility  Bed Mobility Overal bed mobility: Modified Independent             General bed mobility comments: No assist to rise    Transfers Overall  transfer level: Needs assistance Equipment used: None Transfers: Sit to/from Stand Sit to Stand: Supervision           General transfer comment: Supervision for safety, increased sway, decreased awareness of proximity of obstacles on his Rt side. Performed from bed and recliner, no physical assist.    Ambulation/Gait Ambulation/Gait assistance: Supervision Gait Distance (Feet): 375 Feet Assistive device: None Gait Pattern/deviations: Step-through pattern, Wide base of support, Staggering left Gait velocity: WFL Gait velocity interpretation: >2.62 ft/sec, indicative of community ambulatory   General Gait Details: Mild instability, wide BOS, occasional stagger towards left side, with minor drift from straight path at times. No physical assist required to correct during bout. Challenged with backwards steps, tandem walk, and heel/toe stepping. Increases instability but has step reflex to correct LOB. Decreased awareness and anticipation when challenged with secondary cognitive task nearly tripping over hazard unless cued.   Stairs Stairs: Yes Stairs assistance: Supervision Stair Management: Alternating pattern, Forwards, No rails, One rail Right Number of Stairs: 5 (x3) General stair comments: Supervision for safety, with single rail, progressed to no rail, cues to go slow and for awareness of foot placement.   Wheelchair Mobility     Tilt Bed    Modified Rankin (Stroke Patients Only) Modified Rankin (Stroke Patients Only) Pre-Morbid Rankin Score: No symptoms Modified Rankin: Moderately severe disability     Balance Overall balance assessment: Mild deficits observed, not formally tested Sitting-balance support: No upper extremity supported, Feet supported Sitting balance-Leahy Scale: Good Sitting balance - Comments: Donnes shoes at EOB.   Standing balance support: No upper extremity supported, During functional activity Standing balance-Leahy Scale: Good  Cognition Arousal: Alert Behavior During Therapy: WFL for tasks assessed/performed Overall Cognitive Status: Impaired/Different from baseline Area of Impairment: Memory, Following commands, Safety/judgement, Awareness, Problem solving                   Current Attention Level: Sustained Memory: Decreased recall of precautions, Decreased short-term memory Following Commands: Follows one step commands consistently, Follows multi-step commands inconsistently Safety/Judgement: Decreased awareness of safety, Decreased awareness of deficits Awareness: Emergent Problem Solving: Slow processing, Decreased initiation, Difficulty sequencing, Requires verbal cues General Comments: repeating himself several times.        Exercises      General Comments        Pertinent Vitals/Pain Pain Assessment Pain Assessment: No/denies pain Pain Intervention(s): Monitored during session    Home Living                          Prior Function            PT Goals (current goals can now be found in the care plan section) Acute Rehab PT Goals Patient Stated Goal: to improve PT Goal Formulation: With patient Time For Goal Achievement: 07/05/23 Potential to Achieve Goals: Fair Progress towards PT goals: Progressing toward goals    Frequency    Min 1X/week      PT Plan      Co-evaluation              AM-PAC PT "6 Clicks" Mobility   Outcome Measure  Help needed turning from your back to your side while in a flat bed without using bedrails?: None Help needed moving from lying on your back to sitting on the side of a flat bed without using bedrails?: None Help needed moving to and from a bed to a chair (including a wheelchair)?: None Help needed standing up from a chair using your arms (e.g., wheelchair or bedside chair)?: None Help needed to walk in hospital room?: None Help needed climbing 3-5 steps with a railing? : A Little 6 Click  Score: 23    End of Session Equipment Utilized During Treatment: Gait belt Activity Tolerance: Patient tolerated treatment well Patient left: in chair;with call bell/phone within reach;with chair alarm set   PT Visit Diagnosis: Unsteadiness on feet (R26.81);Other abnormalities of gait and mobility (R26.89);Difficulty in walking, not elsewhere classified (R26.2);Other symptoms and signs involving the nervous system (R29.898);Muscle weakness (generalized) (M62.81)     Time: 1610-9604 PT Time Calculation (min) (ACUTE ONLY): 15 min  Charges:    $Gait Training: 8-22 mins PT General Charges $$ ACUTE PT VISIT: 1 Visit                     Brian [name], PT, DPT [facility]  Rehabilitation Services Physical Therapist Office: [phone] Website: [email or web address]    Berton Mount 06/25/2023, 4:37 PM

## 2023-06-26 NOTE — Progress Notes (Signed)
Looks good, awake alert, states he feels good. Awaiting SNF. Stilll no psych consult.

## 2023-06-27 MED ORDER — MENTHOL 3 MG MT LOZG
1.0000 | LOZENGE | OROMUCOSAL | Status: DC | PRN
  Administered 2023-06-27: 3 mg via ORAL
  Filled 2023-06-27: qty 9

## 2023-06-27 NOTE — Progress Notes (Signed)
NEUROSURGERY PROGRESS NOTE  Doing well. Sitting up in chair resting comfortably. Frustrated because he wants to go home. According to notes, doesn't seem like he has someone to help him at home and daughter wants him to go to SNF. Patient does not desire this.   Temp:  [97.9 F (36.6 C)-98.8 F (37.1 C)] 98.8 F (37.1 C) (10/27 0839) Pulse Rate:  [68-99] 83 (10/27 0839) Resp:  [14-20] 18 (10/27 0839) BP: (97-138)/(56-79) 108/64 (10/27 0839) SpO2:  [96 %-99 %] 99 % (10/27 0839)    Sherryl Manges, NP 06/27/2023 9:41 AM

## 2023-06-28 MED ORDER — HYDROCODONE-ACETAMINOPHEN 5-325 MG PO TABS
1.0000 | ORAL_TABLET | Freq: Three times a day (TID) | ORAL | Status: DC | PRN
  Administered 2023-06-29: 1 via ORAL
  Filled 2023-06-28 (×2): qty 1

## 2023-06-28 NOTE — Progress Notes (Signed)
Occupational Therapy Treatment Patient Details Name: Brian [name] MRN: [id number] DOB: 07-Nov-1955 Today's Date: 06/28/2023   History of present illness Pt is a 67 y.o. male who presented 06/08/23 for left frontotemporal craniotomy for clipping of anterior communicating artery aneurysm. Hospital course complicated by pt agitation and afib with RVR. CT head 10/9 showed mild to moderate bifrontal pneumocephalus and small volume of extra-axial hemorrhage with mild mass effect on the frontal lobes without midline shift. PMH: OSA (not using the CPAP), HTN, diet controlled DM-2, tobacco smoking for 52 yrs, GERD, anxiety, depression, CVA, seizure disorder, and CKD-3b   OT comments  Pt is making fair progress towards their acute OT goals. Pt with report of headache upon arrival but willing to participate. He continues to do well physically only needing supervision A for mobility and ADLs. He was able to recall surgical history and "bad memory" as his reasons for being in the hospital with cues and re-direction. He remains limited by attention, dual tasking, insight and safety, needing significant cues during functional tasks with dual tasking components or distracting environment. OT to continue to follow acutely to facilitate progress towards established goals. Pt will continue to benefit from skilled inpatient follow up therapy, <3 hours/day.       If plan is discharge home, recommend the following:  Direct supervision/assist for medications management;Help with stairs or ramp for entrance;Direct supervision/assist for financial management;Assistance with cooking/housework;Assist for transportation;A little help with walking and/or transfers;A little help with bathing/dressing/bathroom   Equipment Recommendations  None recommended by OT       Precautions / Restrictions Precautions Precautions: Fall Precaution Comments: significant cognitive impairment Restrictions Weight Bearing Restrictions: No        Mobility Bed Mobility Overal bed mobility: Modified Independent                  Transfers Overall transfer level: Needs assistance Equipment used: None Transfers: Sit to/from Stand Sit to Stand: Supervision                 Balance Overall balance assessment: Mild deficits observed, not formally tested             ADL either performed or assessed with clinical judgement   ADL Overall ADL's : Needs assistance/impaired     Grooming: Supervision/safety;Standing Grooming Details (indicate cue type and reason): good sequencing without distractions, needs cues when completed with a dual task (talking + grooming.))             Lower Body Dressing: Supervision/safety;Sit to/from stand Lower Body Dressing Details (indicate cue type and reason): donned socks in sitting Toilet Transfer: Supervision/safety;Ambulation           Functional mobility during ADLs: Supervision/safety General ADL Comments: conitnues to be limited by cognition, physically progressing well for ADLs and functional mobility    Extremity/Trunk Assessment Upper Extremity Assessment Upper Extremity Assessment: Overall WFL for tasks assessed   Lower Extremity Assessment Lower Extremity Assessment: Defer to PT evaluation        Vision   Vision Assessment?: No apparent visual deficits   Perception Perception Perception: Not tested   Praxis Praxis Praxis: Not tested    Cognition Arousal: Alert Behavior During Therapy: WFL for tasks assessed/performed Overall Cognitive Status: Impaired/Different from baseline Area of Impairment: Memory, Following commands, Safety/judgement, Awareness, Problem solving                 Orientation Level: Disoriented to, Time Current Attention Level: Sustained Memory: Decreased recall of precautions,  Decreased short-term memory Following Commands: Follows one step commands consistently, Follows multi-step commands  inconsistently Safety/Judgement: Decreased awareness of safety, Decreased awareness of deficits Awareness: Emergent Problem Solving: Slow processing, Decreased initiation, Difficulty sequencing, Requires verbal cues General Comments: pt reports a significant headache. continues to have very limited insight, needed maximal cues to recall medical reasons to be in the hospital. Unable to carry out 3 step tasks, does well with 2 steps commands when there are no environmnetal distractions. Continues to perseverate on wanting to go home to see his mom and grandchildren              General Comments VSS on RA    Pertinent Vitals/ Pain       Pain Assessment Pain Assessment: No/denies pain Pain Intervention(s): Monitored during session   Frequency  Min 1X/week        Progress Toward Goals  OT Goals(current goals can now be found in the care plan section)  Progress towards OT goals: Progressing toward goals  Acute Rehab OT Goals Patient Stated Goal: to see family OT Goal Formulation: With patient Time For Goal Achievement: 07/05/23 Potential to Achieve Goals: Good ADL Goals Pt Will Perform Grooming: Independently;standing Pt Will Transfer to Toilet: Independently;ambulating Additional ADL Goal #1: Pt will demonstrate improved sustained attention to complete at least 3 ADLs in a row without cues for re-direction Additional ADL Goal #2: Pt will complete IADL medicaiton management task with min cues for accuracy Additional ADL Goal #3: Pt will independently identify at least 2 limitations/deficits he has to demonstrate improved intellectual awareness   AM-PAC OT "6 Clicks" Daily Activity     Outcome Measure   Help from another person eating meals?: A Little Help from another person taking care of personal grooming?: A Little Help from another person toileting, which includes using toliet, bedpan, or urinal?: A Little Help from another person bathing (including washing, rinsing,  drying)?: A Little Help from another person to put on and taking off regular upper body clothing?: A Little Help from another person to put on and taking off regular lower body clothing?: A Little 6 Click Score: 18    End of Session    OT Visit Diagnosis: Unsteadiness on feet (R26.81);Other abnormalities of gait and mobility (R26.89);Muscle weakness (generalized) (M62.81);Other symptoms and signs involving cognitive function Pain - Right/Left: Right   Activity Tolerance Patient tolerated treatment well   Patient Left in bed;with call bell/phone within reach         Time: 1200-1217 OT Time Calculation (min): 17 min  Charges: OT General Charges $OT Visit: 1 Visit OT Treatments $Self Care/Home Management : 8-22 mins  Derenda Mis, OTR/L Acute Rehabilitation Services Office [phone] Secure Chat Communication Preferred   Donia Pounds 06/28/2023, 1:49 PM

## 2023-06-28 NOTE — Progress Notes (Signed)
   06/28/23 1959  Provider Notification  Provider Name/Title On Call MD  Date Provider Notified 06/28/23  Time Provider Notified 1959  Method of Notification Page  Notification Reason Other (Comment) (Pt c/o of severe pain in left head radiating to the left eye)  Provider response See new orders  Date of Provider Response 06/28/23 (NP Doran Durand)  Time of Provider Response 2004

## 2023-06-28 NOTE — Progress Notes (Signed)
  NEUROSURGERY PROGRESS NOTE   No complaints this am, no issues overnight   EXAM:  BP (!) 97/59 (BP Location: Right Arm)   Pulse 67   Temp 98.1 F (36.7 C) (Oral)   Resp 18   Ht 6' (1.829 m)   Wt 90.7 kg   SpO2 98%   BMI 27.12 kg/m   awake, alert, oriented CN intact, partial left frontalis palsy 5/5 BUE/BLE Wound c/d/i  IMPRESSION:  67 y.o. male  s/p elective Acom clipping, neurologically at baseline. SW/PT/OT concerned about patient safety returning home due more to cognitive/behavioral issues which appear to be baseline (pre-surgical issues).  PLAN: - - will d/w SW and family regarding return home vs SNF   Lisbeth Renshaw, MD [facility]

## 2023-06-29 DIAGNOSIS — R4182 Altered mental status, unspecified: Secondary | ICD-10-CM

## 2023-06-29 DIAGNOSIS — I671 Cerebral aneurysm, nonruptured: Secondary | ICD-10-CM | POA: Diagnosis not present

## 2023-06-29 NOTE — Progress Notes (Signed)
Physical Therapy Treatment Patient Details Name: Brian [name] MRN: [id number] DOB: Oct 24, 1955 Today's Date: 06/29/2023   History of Present Illness Pt is a 67 y.o. male who presented 06/08/23 for left frontotemporal craniotomy for clipping of anterior communicating artery aneurysm. Hospital course complicated by pt agitation and afib with RVR. CT head 10/9 showed mild to moderate bifrontal pneumocephalus and small volume of extra-axial hemorrhage with mild mass effect on the frontal lobes without midline shift. PMH: OSA (not using the CPAP), HTN, diet controlled DM-2, tobacco smoking for 52 yrs, GERD, anxiety, depression, CVA, seizure disorder, and CKD-3b    PT Comments  Pt greeted up in chair on arrival and agreeable to session with steady progress. Pt demonstrating good gains with mobility, however continues to be most limited by impaired cognition, decreased insight into deficits and poor safety awareness. Pt demonstrating gait in hall at supervision level with challenges to dynamic balance as well as cognitive dual tasking resulting in increased instability with pt often needing to stop to complete. Pt able to self correct all LOB in all instances, however pt needing cues for anticipatory  correction of obstacles on R. Pt continues to benefit from skilled PT services to progress toward functional mobility goals.     If plan is discharge home, recommend the following: Assistance with cooking/housework;Assist for transportation;Help with stairs or ramp for entrance;Supervision due to cognitive status;Direct supervision/assist for financial management;Direct supervision/assist for medications management   Can travel by private vehicle     Yes  Equipment Recommendations  Other (comment) (shower chair)    Recommendations for Other Services       Precautions / Restrictions Precautions Precautions: Fall Precaution Comments: significant cognitive impairment Restrictions Weight Bearing  Restrictions: No     Mobility  Bed Mobility Overal bed mobility: Modified Independent             General bed mobility comments: No assist to rise    Transfers Overall transfer level: Needs assistance Equipment used: None Transfers: Sit to/from Stand Sit to Stand: Supervision           General transfer comment: Supervision for safety, increased sway, decreased awareness of proximity of obstacles on his Rt side.    Ambulation/Gait Ambulation/Gait assistance: Supervision Gait Distance (Feet): 400 Feet Assistive device: None Gait Pattern/deviations: Step-through pattern, Wide base of support, Staggering left Gait velocity: WFL     General Gait Details: Mild instability, wide BOS, very close to obstacles on R, No physical assist required to correct during bout. pt needing to stop for recall during cognitive dual tasking Challenged with backwards steps, tandem walk, and braiding.   Stairs             Wheelchair Mobility     Tilt Bed    Modified Rankin (Stroke Patients Only) Modified Rankin (Stroke Patients Only) Pre-Morbid Rankin Score: No symptoms Modified Rankin: Moderately severe disability     Balance Overall balance assessment: Mild deficits observed, not formally tested Sitting-balance support: No upper extremity supported, Feet supported Sitting balance-Leahy Scale: Good Sitting balance - Comments: Donnes shoes at EOB. Postural control: Posterior lean, Left lateral lean Standing balance support: No upper extremity supported, During functional activity Standing balance-Leahy Scale: Good Standing balance comment: pt was moving objects around room, leaning down to straighten pants without LOB                            Cognition Arousal: Alert Behavior During Therapy: [facility] for  tasks assessed/performed Overall Cognitive Status: Impaired/Different from baseline Area of Impairment: Memory, Following commands, Safety/judgement, Awareness,  Problem solving                 Orientation Level: Disoriented to, Time Current Attention Level: Sustained Memory: Decreased recall of precautions, Decreased short-term memory Following Commands: Follows one step commands consistently, Follows multi-step commands inconsistently Safety/Judgement: Decreased awareness of safety, Decreased awareness of deficits Awareness: Emergent Problem Solving: Slow processing, Decreased initiation, Difficulty sequencing, Requires verbal cues General Comments: pt reports a significant headache. continues to have very limited insight into deficits, disoriented to time of day        Exercises      General Comments General comments (skin integrity, edema, etc.): VSS on RA      Pertinent Vitals/Pain Pain Assessment Pain Assessment: Faces Faces Pain Scale: Hurts little more Pain Location: L side of face Pain Descriptors / Indicators: Discomfort, Grimacing, Aching Pain Intervention(s): Monitored during session, Limited activity within patient's tolerance    Home Living                          Prior Function            PT Goals (current goals can now be found in the care plan section) Acute Rehab PT Goals Patient Stated Goal: to improve PT Goal Formulation: With patient Time For Goal Achievement: 07/05/23 Progress towards PT goals: Progressing toward goals    Frequency    Min 1X/week      PT Plan      Co-evaluation              AM-PAC PT "6 Clicks" Mobility   Outcome Measure  Help needed turning from your back to your side while in a flat bed without using bedrails?: None Help needed moving from lying on your back to sitting on the side of a flat bed without using bedrails?: None Help needed moving to and from a bed to a chair (including a wheelchair)?: None Help needed standing up from a chair using your arms (e.g., wheelchair or bedside chair)?: None Help needed to walk in hospital room?: None Help  needed climbing 3-5 steps with a railing? : A Little 6 Click Score: 23    End of Session   Activity Tolerance: Patient tolerated treatment well Patient left: in chair;with call bell/phone within reach Nurse Communication: Mobility status PT Visit Diagnosis: Unsteadiness on feet (R26.81);Other abnormalities of gait and mobility (R26.89);Difficulty in walking, not elsewhere classified (R26.2);Other symptoms and signs involving the nervous system (R29.898);Muscle weakness (generalized) (M62.81)     Time: 6213-0865 PT Time Calculation (min) (ACUTE ONLY): 14 min  Charges:    $Gait Training: 8-22 mins PT General Charges $$ ACUTE PT VISIT: 1 Visit                     Laquinn Shippy R. PTA Acute Rehabilitation Services Office: [phone]   Catalina Antigua 06/29/2023, 10:39 AM
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
## 2023-06-29 NOTE — TOC Progression Note (Signed)
Transition of Care [facility]) - Progression Note    Patient Details  Name: Brian [name] MRN: [id number] Date of Birth: Dec 07, 1955  [facility]) CM/SW Contact  Erin Sons, [place] Phone Number: 06/29/2023, 1:28 PM  Clinical Narrative:     CSW called [employer] to check on status of appeal. CSW is informed that an Appointment of Representative form needs to be completed and faxed to [phone].   CSW spoke with pt's daughter and updated her. CSW emailed daughter form at [email or web address] She will complete form and fax it to CSW and to [facility]   Expected Discharge Plan: Skilled Nursing Facility Barriers to Discharge: English as a second language teacher, Continued Medical Work up  Expected Discharge Plan and Services In-house Referral: Clinical Social Work     Living arrangements for the past 2 months: Single Family Home                                       Social Determinants of Health (SDOH) Interventions SDOH Screenings   Depression (PHQ2-9): High Risk (04/01/2023)  Tobacco Use: High Risk (06/08/2023)    Readmission Risk Interventions     No data to display

## 2023-06-29 NOTE — Progress Notes (Signed)
The patient reported worsening pain on the left side, along with a headache. He mentioned experiencing similar pain last week, but noted it has intensified. Both the night and morning nurses observed increased swelling on the left side Compared to last week. Narcotics were prescribed overnight, but the patient's pain level did not initially meet the criteria for administration. However, the morning nurse administered a narcotic due to the patient's increased pain level. The MD discontinued narcotic medication this morning. MD explaining that some swelling is expected and emphasizing the need to stop narcotics. This nurse has consulted with Dr. Conchita Paris regarding the patient's ongoing pain and swelling. No new orders at this time. Will continue to monitor.

## 2023-06-29 NOTE — Progress Notes (Signed)
  NEUROSURGERY PROGRESS NOTE   Pt without complaint this am with the exception of mild left sided HA.  EXAM:  BP 111/62 (BP Location: Right Arm)   Pulse 69   Temp 97.9 F (36.6 C) (Oral)   Resp 18   Ht 6' (1.829 m)   Wt 90.7 kg   SpO2 99%   BMI 27.12 kg/m   awake, alert, oriented Speech fluent CN intact MAE well Wound c/d/i, some mild swelling  IMPRESSION:  67 y.o. male  s/p Acom clipping, neurologically well. Upon discussion with patient's family including sister, they feel his decision making has been off for at least several months. Family appeal for insurance coverage of SNF is pending.  PLAN: - Appreciate psych consult. Pt maintains capacity for medical decisions but appears willing to wait for Insurance appeal for SNF. If this fails, would plan on d/c home  - Cont to hold narcotic pain medicine   Lisbeth Renshaw, MD [facility]

## 2023-06-30 MED ORDER — TRAMADOL HCL 50 MG PO TABS
50.0000 mg | ORAL_TABLET | Freq: Four times a day (QID) | ORAL | Status: DC | PRN
  Administered 2023-07-01 – 2023-07-02 (×3): 50 mg via ORAL
  Filled 2023-06-30 (×3): qty 1

## 2023-06-30 NOTE — Progress Notes (Signed)
Pt unchanged. Cont to c/o left-sided HA and some swelling. Neurologically normal. Awaiting family appeal of insurance denial for SNF.

## 2023-06-30 NOTE — Progress Notes (Signed)
Called MD inquiring if pt could be prescribed something for pain other than tylenol he has been taking.  Pt states his headache is a 7/10, has remained in bed much of the day, and refused to work with OT.  Spoke with Kathie Rhodes, RN at office.  She returned call and stated to continue tylenol per Dr. Conchita Paris.

## 2023-06-30 NOTE — Progress Notes (Signed)
   06/30/23 1948  Provider Notification  Provider Name/Title Dr Conchita Paris  Date Provider Notified 06/30/23  Time Provider Notified 1950  Method of Notification Page  Notification Reason Other (Comment) (Pt c/o of severe headache and dizziness, BP 97/54)  Provider response See new orders  Date of Provider Response 06/30/23  Time of Provider Response 2009

## 2023-06-30 NOTE — Progress Notes (Signed)
OT Cancellation Note  Patient Details Name: BRICE KIBLER MRN: [id number] DOB: [birth year]   Cancelled Treatment:    Reason Eval/Treat Not Completed: Pain limiting ability to participate (Pt reports significant headache and kindly asked therapist to come back tomorrow, RN made aware)  Donia Pounds 06/30/2023, 2:27 PM

## 2023-07-01 MED ORDER — ASPIRIN 81 MG PO TBEC
81.0000 mg | DELAYED_RELEASE_TABLET | Freq: Every day | ORAL | Status: DC
  Administered 2023-07-02 – 2023-07-05 (×4): 81 mg via ORAL
  Filled 2023-07-01 (×4): qty 1

## 2023-07-01 NOTE — Progress Notes (Signed)
  NEUROSURGERY PROGRESS NOTE   No issues overnight. Has some headache.  EXAM:  BP 105/61 (BP Location: Left Arm)   Pulse 80   Temp 98.5 F (36.9 C) (Oral)   Resp 18   Ht 6' (1.829 m)   Wt 90.7 kg   SpO2 95%   BMI 27.12 kg/m   awake, alert, oriented speech fluent CN intact MAE well Wound c/d/i  IMPRESSION:  67 y.o. male  s/p elective Acom clipping, doing well  PLAN: - Cont to await insurance appeal for SNF placement. Otherwise remains ready for d/c home   Lisbeth Renshaw, MD [facility]

## 2023-07-01 NOTE — Progress Notes (Signed)
PT Cancellation Note  Patient Details Name: Brian [name] MRN: [id number] DOB: March 21, 1956   Cancelled Treatment:    Reason Eval/Treat Not Completed: Patient declined, no reason specified  States he is too tired to work with PT this afternoon. He wants to go home. Encouraged to keep mobilizing with staff assist. Will follow and progress as tolerated during admission.  Berton Mount 07/01/2023, 2:07 PM

## 2023-07-01 NOTE — Progress Notes (Signed)
Occupational Therapy Treatment Patient Details Name: Brian [name] MRN: [id number] DOB: 26-Sep-1955 Today's Date: 07/01/2023   History of present illness Pt is a 67 y.o. male who presented 06/08/23 for left frontotemporal craniotomy for clipping of anterior communicating artery aneurysm. Hospital course complicated by pt agitation and afib with RVR. CT head 10/9 showed mild to moderate bifrontal pneumocephalus and small volume of extra-axial hemorrhage with mild mass effect on the frontal lobes without midline shift. PMH: OSA (not using the CPAP), HTN, diet controlled DM-2, tobacco smoking for 52 yrs, GERD, anxiety, depression, CVA, seizure disorder, and CKD-3b   OT comments  Pt is making solid progress towards their acute OT goals. Overall pt is now completing ADLs with mod I but continues to be limited by cognition. Session focused on way finding tasks with problem solving. Pt limited by attention, delayed recall and unable to problem solve with cues to carry out task. Ultimately unable to complete task of "find room 36, and then find room 16." Once pt was near room 36, he stated "well now were in the 70s." Ultimately needed total A to complete cognitive tasks.  However, pt is demonstrating improved situational awareness and insight/awareness into safety and deficits. OT to continue to follow acutely to facilitate progress towards established goals. Pt will continue to benefit from skilled inpatient follow up therapy, <3 hours/day or 24/7 assist at home.       If plan is discharge home, recommend the following:  Direct supervision/assist for medications management;Help with stairs or ramp for entrance;Direct supervision/assist for financial management;Assistance with cooking/housework;Assist for transportation;A little help with walking and/or transfers;A little help with bathing/dressing/bathroom   Equipment Recommendations  None recommended by OT    Recommendations for Other Services Speech  consult    Precautions / Restrictions Precautions Precautions: Fall Precaution Comments: significant cognitive impairment Restrictions Weight Bearing Restrictions: No       Mobility Bed Mobility Overal bed mobility: Modified Independent                  Transfers Overall transfer level: Needs assistance Equipment used: None Transfers: Sit to/from Stand Sit to Stand: Modified independent (Device/Increase time)                 Balance Overall balance assessment: Mild deficits observed, not formally tested           ADL either performed or assessed with clinical judgement   ADL Overall ADL's : Needs assistance/impaired                     Lower Body Dressing: Modified independent Lower Body Dressing Details (indicate cue type and reason): donned both shoes               General ADL Comments: overall pt is completing ADLs with mod I, no AD.    Extremity/Trunk Assessment Upper Extremity Assessment Upper Extremity Assessment: Overall WFL for tasks assessed   Lower Extremity Assessment Lower Extremity Assessment: Defer to PT evaluation        Vision   Vision Assessment?: Vision impaired- to be further tested in functional context Additional Comments: vision now impacted by L facial swellign causing eye-lid drooping and restricting pt L eye vision.   Perception Perception Perception: Within Functional Limits   Praxis Praxis Praxis: WFL    Cognition Arousal: Alert Behavior During Therapy: WFL for tasks assessed/performed Overall Cognitive Status: Impaired/Different from baseline Area of Impairment: Memory, Attention, Following commands, Safety/judgement, Awareness, Problem solving  Current Attention Level: Sustained Memory: Decreased short-term memory Following Commands: Follows one step commands consistently, Follows multi-step commands inconsistently Safety/Judgement: Decreased awareness of deficits,  Decreased awareness of safety Awareness: Emergent Problem Solving: Slow processing, Requires verbal cues General Comments: pt continues to be limited by cognition, but slowly improving. Overall his insight to deficits and safety is improving as well as situational awarenss. He remains limited by memory and multi-step directions and problem solving. Pt unable to recall 2 step way finding task with a 2 minute delay. Unable to path find room 36 from his room (initially going in the right direction, and when we got near room 36 pt stated, "well this is 70s).              General Comments VSS, 6/10 pain headache. edema on L side of face    Pertinent Vitals/ Pain       Pain Assessment Pain Assessment: Faces Faces Pain Scale: Hurts little more Pain Location: L headache Pain Descriptors / Indicators: Discomfort, Grimacing, Aching Pain Intervention(s): Limited activity within patient's tolerance, Monitored during session         Frequency  Min 1X/week        Progress Toward Goals  OT Goals(current goals can now be found in the care plan section)  Progress towards OT goals: Progressing toward goals  Acute Rehab OT Goals Patient Stated Goal: less pain OT Goal Formulation: With patient Time For Goal Achievement: 07/05/23 Potential to Achieve Goals: Good ADL Goals Pt Will Perform Grooming: Independently;standing Pt Will Transfer to Toilet: Independently;ambulating Additional ADL Goal #1: Pt will demonstrate improved sustained attention to complete at least 3 ADLs in a row without cues for re-direction Additional ADL Goal #2: Pt will complete IADL medicaiton management task with min cues for accuracy Additional ADL Goal #3: Pt will independently identify at least 2 limitations/deficits he has to demonstrate improved intellectual awareness      AM-PAC OT "6 Clicks" Daily Activity     Outcome Measure   Help from another person eating meals?: None Help from another person taking  care of personal grooming?: None Help from another person toileting, which includes using toliet, bedpan, or urinal?: None Help from another person bathing (including washing, rinsing, drying)?: None Help from another person to put on and taking off regular upper body clothing?: None Help from another person to put on and taking off regular lower body clothing?: None 6 Click Score: 24    End of Session    OT Visit Diagnosis: Unsteadiness on feet (R26.81);Other abnormalities of gait and mobility (R26.89);Muscle weakness (generalized) (M62.81);Other symptoms and signs involving cognitive function Pain - Right/Left: Right   Activity Tolerance Patient tolerated treatment well   Patient Left in chair;with call bell/phone within reach   Nurse Communication Mobility status        Time: 1610-9604 OT Time Calculation (min): 19 min  Charges: OT General Charges $OT Visit: 1 Visit OT Treatments $Therapeutic Activity: 8-22 mins  Derenda Mis, OTR/L Acute Rehabilitation Services Office [phone] Secure Chat Communication Preferred   Brian [name] 07/01/2023, 2:02 PM

## 2023-07-01 NOTE — TOC Progression Note (Signed)
Transition of Care [facility]) - Progression Note    Patient Details  Name: NICHAEL REEM MRN: [id number] Date of Birth: 04-25-1956  Transition of Care [facility]) CM/SW Contact  Baldemar Lenis, [place] Phone Number: 07/01/2023, 10:22 AM  Clinical Narrative:   CSW spoke with daughter, [name], about status of appeal. She sent the ROA form back to [facility] on Tuesday, and has not heard anything back since. CSW to contact [employer] to check on status of appeal.    Expected Discharge Plan: Skilled Nursing Facility Barriers to Discharge: English as a second language teacher, Continued Medical Work up  Expected Discharge Plan and Services In-house Referral: Clinical Social Work     Living arrangements for the past 2 months: Single Family Home                                       Social Determinants of Health (SDOH) Interventions SDOH Screenings   Depression (PHQ2-9): High Risk (04/01/2023)  Tobacco Use: High Risk (06/08/2023)    Readmission Risk Interventions     No data to display

## 2023-07-01 NOTE — Plan of Care (Signed)
  Problem: Education: Goal: Understanding of CV disease, CV risk reduction, and recovery process will improve Outcome: Progressing Goal: Individualized Educational Video(s) Outcome: Progressing   Problem: Activity: Goal: Ability to return to baseline activity level will improve Outcome: Progressing   Problem: Cardiovascular: Goal: Ability to achieve and maintain adequate cardiovascular perfusion will improve Outcome: Progressing Goal: Vascular access site(s) Level 0-1 will be maintained Outcome: Progressing   Problem: Health Behavior/Discharge Planning: Goal: Ability to safely manage health-related needs after discharge will improve Outcome: Progressing   Problem: Education: Goal: Knowledge of the prescribed therapeutic regimen will improve Outcome: Progressing   Problem: Clinical Measurements: Goal: Usual level of consciousness will be regained or maintained. Outcome: Progressing Goal: Neurologic status will improve Outcome: Progressing Goal: Ability to maintain intracranial pressure will improve Outcome: Progressing   Problem: Skin Integrity: Goal: Demonstration of wound healing without infection will improve Outcome: Progressing   Problem: Safety: Goal: Non-violent Restraint(s) Outcome: Progressing   Problem: Education: Goal: Knowledge of General Education information will improve Description: Including pain rating scale, medication(s)/side effects and non-pharmacologic comfort measures Outcome: Progressing   Problem: Health Behavior/Discharge Planning: Goal: Ability to manage health-related needs will improve Outcome: Progressing   Problem: Clinical Measurements: Goal: Ability to maintain clinical measurements within normal limits will improve Outcome: Progressing Goal: Will remain free from infection Outcome: Progressing Goal: Diagnostic test results will improve Outcome: Progressing Goal: Respiratory complications will improve Outcome: Progressing Goal:  Cardiovascular complication will be avoided Outcome: Progressing   Problem: Activity: Goal: Risk for activity intolerance will decrease Outcome: Progressing   Problem: Nutrition: Goal: Adequate nutrition will be maintained Outcome: Progressing   Problem: Coping: Goal: Level of anxiety will decrease Outcome: Progressing   Problem: Elimination: Goal: Will not experience complications related to bowel motility Outcome: Progressing Goal: Will not experience complications related to urinary retention Outcome: Progressing   Problem: Pain Managment: Goal: General experience of comfort will improve Outcome: Progressing   Problem: Safety: Goal: Ability to remain free from injury will improve Outcome: Progressing   Problem: Skin Integrity: Goal: Risk for impaired skin integrity will decrease Outcome: Progressing

## 2023-07-02 ENCOUNTER — Ambulatory Visit: Payer: Medicare HMO | Admitting: Internal Medicine

## 2023-07-02 NOTE — Progress Notes (Signed)
Physical Therapy Treatment Patient Details Name: Brian [name] MRN: [id number] DOB: [birth year] Today's Date: 07/02/2023   History of Present Illness Pt is a 67 y.o. male who presented 06/08/23 for left frontotemporal craniotomy for clipping of anterior communicating artery aneurysm. Hospital course complicated by pt agitation and afib with RVR. CT head 10/9 showed mild to moderate bifrontal pneumocephalus and small volume of extra-axial hemorrhage with mild mass effect on the frontal lobes without midline shift. PMH: OSA (not using the CPAP), HTN, diet controlled DM-2, tobacco smoking for 52 yrs, GERD, anxiety, depression, CVA, seizure disorder, and CKD-3b    PT Comments  Focused session on challenging pt's cognition and gait balance through dual tasking. Pt performed the TUG, motor TUG, and cognitive TUG 3x each. See details in Cognition General Comments. Pt actually ambulated faster with a similar gait pattern when performing the motor TUG, but he did not appear to be really paying attention to the cup of water and may have been more lucky it did not spill. His speed slowed down significantly when performing the cognitive TUG and he repeatedly could not get past 34 without making a mistake or stopping when counting backwards from 40 by 2s. He had a 55.19% increase in time it took him to complete the cognitive TUG compared to the regular TUG. In addition, he was noted to sway/stagger to the L during the cognitive TUG, but he recovered on his own with reactional strategies. This suggests the pt's primary deficit remains his cognition and his cognitive deficits can impact his balance and place him at risk for falls. Educated pt on this concern. Will continue to follow acutely.   If plan is discharge home, recommend the following: Assistance with cooking/housework;Assist for transportation;Help with stairs or ramp for entrance;Supervision due to cognitive status;Direct supervision/assist for financial  management;Direct supervision/assist for medications management   Can travel by private vehicle     Yes  Equipment Recommendations  Other (comment) (shower chair)    Recommendations for Other Services       Precautions / Restrictions Precautions Precautions: Fall Precaution Comments: significant cognitive impairment Restrictions Weight Bearing Restrictions: No     Mobility  Bed Mobility               General bed mobility comments: Pt in chair upon arrival and at end of session    Transfers Overall transfer level: Needs assistance Equipment used: None Transfers: Sit to/from Stand Sit to Stand: Supervision           General transfer comment: Supervision for safety, no LOB    Ambulation/Gait Ambulation/Gait assistance: Supervision Gait Distance (Feet): 280 Feet (x1 ~20 ft bout, then > x9 TUG gait bouts, then x1 ~208 ft bout) Assistive device: None Gait Pattern/deviations: Step-through pattern, Wide base of support, Staggering left Gait velocity: WFL Gait velocity interpretation: >2.62 ft/sec, indicative of community ambulatory   General Gait Details: When pt dual tasking with motor task pt maintains gait pattern without dual tasking, which includes wide BOS with mild sway but no LOB. When performing cog dual task, pt staggers to the L, but takes reactional steps to recover. Supervision for safety   Stairs Stairs: Yes Stairs assistance: Supervision Stair Management: Alternating pattern, Forwards, No rails, One rail Right, Two rails Number of Stairs: 10 (x4 standard height, x6 short height) General stair comments: Pt alternates between handrail use and no UE support, no LOB, supervision for safety   Wheelchair Mobility     Tilt Bed  Modified Rankin (Stroke Patients Only) Modified Rankin (Stroke Patients Only) Pre-Morbid Rankin Score: No symptoms Modified Rankin: Moderate disability     Balance Overall balance assessment: Mild deficits observed,  not formally tested Sitting-balance support: No upper extremity supported, Feet supported Sitting balance-Leahy Scale: Good     Standing balance support: No upper extremity supported, During functional activity Standing balance-Leahy Scale: Good Standing balance comment: Instability noted when ambulating and performing cog dual task, but recovers with reactional steps, supervision for safety                            Cognition Arousal: Alert Behavior During Therapy: WFL for tasks assessed/performed Overall Cognitive Status: Impaired/Different from baseline Area of Impairment: Memory, Following commands, Safety/judgement, Awareness, Problem solving, Attention                   Current Attention Level: Sustained Memory: Decreased recall of precautions, Decreased short-term memory Following Commands: Follows one step commands consistently, Follows multi-step commands with increased time Safety/Judgement: Decreased awareness of safety, Decreased awareness of deficits Awareness: Emergent Problem Solving: Slow processing, Difficulty sequencing, Requires verbal cues General Comments: Decreased insight into deficits and safety. Pt able to visually recall how to find his room without cues as pt reports he has become familiar with the halls. He has difficulty comprehending instructions for the cog TUG, initially ambulating backwards while counting backwards. He did better once it was demonstrated to him. He has difficulty with dual tasking. When performing TUG, pt cued to stand when therapist said go walk to cone then come back and sit down. He was informed he would be timed. Average of x3 trials each - TUG = 11.85 seconds, Motor TUG (carrying cup of water (filled up to ~1 inch from surface of cup)) = 10.69 seconds (pt did not appear to glance or pay attention to water cup though), Cognitive TUG (counting backwards from 40 by 2s as pt could not count backwards by 3s at rest) = 18.39  seconds. He swayed more during the Cognitive TUG. During Cog TUG 1st trial pt made 1 mistake stating 33 then stopped there, 2nd trial 1 mistake to 33 again but corrected to 32 then stopped there, 3rd trial same as 2nd. Absolute Dual Task for Cognitive TUG = +6.65 seconds. Absolute Dual Task for Motor TUG = -1.16 seconds. Relative Dual Task for Cognitive TUG = 55.19%. Relative Dual Task for Motor TUG = -9.79%.        Exercises      General Comments General comments (skin integrity, edema, etc.): educated pt his cognition impacts his balance and thus to not be moving fast when trying to problem solve anything, he verbalized understanding      Pertinent Vitals/Pain Pain Assessment Pain Assessment: Faces Faces Pain Scale: No hurt Pain Intervention(s): Monitored during session    Home Living                          Prior Function            PT Goals (current goals can now be found in the care plan section) Acute Rehab PT Goals Patient Stated Goal: to improve PT Goal Formulation: With patient Time For Goal Achievement: 07/05/23 Potential to Achieve Goals: Fair Progress towards PT goals: Progressing toward goals    Frequency    Min 1X/week      PT Plan      Co-evaluation  AM-PAC PT "6 Clicks" Mobility   Outcome Measure  Help needed turning from your back to your side while in a flat bed without using bedrails?: None Help needed moving from lying on your back to sitting on the side of a flat bed without using bedrails?: None Help needed moving to and from a bed to a chair (including a wheelchair)?: None Help needed standing up from a chair using your arms (e.g., wheelchair or bedside chair)?: None Help needed to walk in hospital room?: A Little Help needed climbing 3-5 steps with a railing? : A Little 6 Click Score: 22    End of Session Equipment Utilized During Treatment: Gait belt Activity Tolerance: Patient tolerated treatment  well Patient left: in chair;with call bell/phone within reach (no chair alarm on prior to session) Nurse Communication: Mobility status PT Visit Diagnosis: Unsteadiness on feet (R26.81);Other abnormalities of gait and mobility (R26.89);Difficulty in walking, not elsewhere classified (R26.2);Other symptoms and signs involving the nervous system (R29.898);Muscle weakness (generalized) (M62.81)     Time: 4696-2952 PT Time Calculation (min) (ACUTE ONLY): 28 min  Charges:    $Gait Training: 8-22 mins $Therapeutic Activity: 8-22 mins PT General Charges $$ ACUTE PT VISIT: 1 Visit                     Virgil Benedict, PT, DPT Acute Rehabilitation Services  Office: [phone]    Bettina Gavia 07/02/2023, 5:38 PM

## 2023-07-02 NOTE — Plan of Care (Signed)
  Problem: Education: Goal: Understanding of CV disease, CV risk reduction, and recovery process will improve Outcome: Progressing Goal: Individualized Educational Video(s) Outcome: Progressing   Problem: Activity: Goal: Ability to return to baseline activity level will improve Outcome: Progressing   Problem: Cardiovascular: Goal: Ability to achieve and maintain adequate cardiovascular perfusion will improve Outcome: Progressing Goal: Vascular access site(s) Level 0-1 will be maintained Outcome: Progressing   Problem: Health Behavior/Discharge Planning: Goal: Ability to safely manage health-related needs after discharge will improve Outcome: Progressing   Problem: Education: Goal: Knowledge of the prescribed therapeutic regimen will improve Outcome: Progressing   Problem: Clinical Measurements: Goal: Usual level of consciousness will be regained or maintained. Outcome: Progressing Goal: Neurologic status will improve Outcome: Progressing Goal: Ability to maintain intracranial pressure will improve Outcome: Progressing   Problem: Skin Integrity: Goal: Demonstration of wound healing without infection will improve Outcome: Progressing   Problem: Safety: Goal: Non-violent Restraint(s) Outcome: Progressing   Problem: Education: Goal: Knowledge of General Education information will improve Description: Including pain rating scale, medication(s)/side effects and non-pharmacologic comfort measures Outcome: Progressing   Problem: Health Behavior/Discharge Planning: Goal: Ability to manage health-related needs will improve Outcome: Progressing   Problem: Clinical Measurements: Goal: Ability to maintain clinical measurements within normal limits will improve Outcome: Progressing Goal: Will remain free from infection Outcome: Progressing Goal: Diagnostic test results will improve Outcome: Progressing Goal: Respiratory complications will improve Outcome: Progressing Goal:  Cardiovascular complication will be avoided Outcome: Progressing   Problem: Activity: Goal: Risk for activity intolerance will decrease Outcome: Progressing   Problem: Nutrition: Goal: Adequate nutrition will be maintained Outcome: Progressing   Problem: Coping: Goal: Level of anxiety will decrease Outcome: Progressing   Problem: Elimination: Goal: Will not experience complications related to bowel motility Outcome: Progressing Goal: Will not experience complications related to urinary retention Outcome: Progressing   Problem: Pain Managment: Goal: General experience of comfort will improve Outcome: Progressing   Problem: Safety: Goal: Ability to remain free from injury will improve Outcome: Progressing   Problem: Skin Integrity: Goal: Risk for impaired skin integrity will decrease Outcome: Progressing

## 2023-07-02 NOTE — Progress Notes (Signed)
  NEUROSURGERY PROGRESS NOTE   No issues overnight. Has some headache.  EXAM:  BP (!) 114/59 (BP Location: Right Arm)   Pulse 77   Temp 97.8 F (36.6 C) (Oral)   Resp 19   Ht 6' (1.829 m)   Wt 90.7 kg   SpO2 95%   BMI 27.12 kg/m   awake, alert, oriented speech fluent CN intact MAE well Wound c/d/i  IMPRESSION:  67 y.o. male  s/p elective Acom clipping, doing well from neurologic standpoint. Remains in-house as family does not feel he is safe to return home and have appealed insurance denial SNF.   PLAN: - Cont to await insurance appeal for SNF placement. Otherwise remains ready for d/c home   Lisbeth Renshaw, MD [facility]

## 2023-07-02 NOTE — Progress Notes (Signed)
OT Cancellation Note  Patient Details Name: Brian [name] MRN: [id number] DOB: May 14, 1956   Cancelled Treatment:     attempted skilled OT session.  Pt. Politely declined stating he had a bad headache along with pointing to and rubbing on his stomach and jaws saying these areas hurt also.  Reported these descriptions to rn.  Will check back as able and pt. Feeling better for participation in therapies.      Alessandra Bevels Lorraine-COTA/L 07/02/2023, 12:10 PM

## 2023-07-03 NOTE — Progress Notes (Signed)
Occupational Therapy Treatment Patient Details Name: Brian [name] MRN: [id number] DOB: [birth year] Today's Date: 07/03/2023   History of present illness Pt is a 67 y.o. male who presented 06/08/23 for left frontotemporal craniotomy for clipping of anterior communicating artery aneurysm. Hospital course complicated by pt agitation and afib with RVR. CT head 10/9 showed mild to moderate bifrontal pneumocephalus and small volume of extra-axial hemorrhage with mild mass effect on the frontal lobes without midline shift. PMH: OSA (not using the CPAP), HTN, diet controlled DM-2, tobacco smoking for 52 yrs, GERD, anxiety, depression, CVA, seizure disorder, and CKD-3b   OT comments  Pt. Was cooperative during session. Pt. Had 2 lob during amb but was able to recover without assist. Pt. Was able to perform toileting tasks at S level and S level for grooming tasks.       If plan is discharge home, recommend the following:  Direct supervision/assist for medications management;Help with stairs or ramp for entrance;Direct supervision/assist for financial management;Assistance with cooking/housework;Assist for transportation;A little help with walking and/or transfers;A little help with bathing/dressing/bathroom   Equipment Recommendations  None recommended by OT    Recommendations for Other Services      Precautions / Restrictions Precautions Precautions: Fall Restrictions Weight Bearing Restrictions: No       Mobility Bed Mobility   Bed Mobility: Supine to Sit Rolling: Mod assist              Transfers     Transfers: Sit to/from Stand Sit to Stand: Supervision                 Balance     Sitting balance-Leahy Scale: Good       Standing balance-Leahy Scale: Fair                             ADL either performed or assessed with clinical judgement   ADL Overall ADL's : Independent     Grooming: Supervision/safety;Standing                    Toilet Transfer: Supervision/safety;Ambulation                  Extremity/Trunk Assessment Upper Extremity Assessment Upper Extremity Assessment: Overall WFL for tasks assessed            Vision   Vision Assessment?: No apparent visual deficits   Perception     Praxis      Cognition Arousal: Alert Behavior During Therapy: WFL for tasks assessed/performed Overall Cognitive Status: Impaired/Different from baseline Area of Impairment: Memory, Following commands, Safety/judgement, Awareness, Problem solving, Attention                                        Exercises      Shoulder Instructions       General Comments Pt. with 2 lob with amb in the room and was able to recover on his own.    Pertinent Vitals/ Pain       Pain Assessment Pain Assessment: 0-10 Pain Score: 5  Pain Location: headache Pain Descriptors / Indicators: Stabbing, Aching Pain Intervention(s): Monitored during session (Pt. does not want pain medicine)  Home Living  Prior Functioning/Environment              Frequency           Progress Toward Goals  OT Goals(current goals can now be found in the care plan section)  Progress towards OT goals: Progressing toward goals  Acute Rehab OT Goals Patient Stated Goal: to be better OT Goal Formulation: With patient Time For Goal Achievement: 07/05/23 Potential to Achieve Goals: Good ADL Goals Pt Will Perform Grooming: Independently;standing Pt Will Transfer to Toilet: Independently;ambulating Additional ADL Goal #1: Pt will demonstrate improved sustained attention to complete at least 3 ADLs in a row without cues for re-direction Additional ADL Goal #2: Pt will complete IADL medicaiton management task with min cues for accuracy Additional ADL Goal #3: Pt will independently identify at least 2 limitations/deficits he has to demonstrate improved intellectual  awareness  Plan      Co-evaluation                 AM-PAC OT "6 Clicks" Daily Activity     Outcome Measure   Help from another person eating meals?: None Help from another person taking care of personal grooming?: A Little Help from another person toileting, which includes using toliet, bedpan, or urinal?: A Little Help from another person bathing (including washing, rinsing, drying)?: A Little Help from another person to put on and taking off regular upper body clothing?: A Little Help from another person to put on and taking off regular lower body clothing?: A Little 6 Click Score: 19    End of Session    OT Visit Diagnosis: Unsteadiness on feet (R26.81);Other abnormalities of gait and mobility (R26.89);Muscle weakness (generalized) (M62.81);Other symptoms and signs involving cognitive function   Activity Tolerance Patient tolerated treatment well   Patient Left in chair;with call bell/phone within reach   Nurse Communication  (ok therapy)        Time: 1610-9604 OT Time Calculation (min): 23 min  Charges: OT General Charges $OT Visit: 1 Visit OT Treatments $Self Care/Home Management : 23-37 mins  Derrek Gu OT/L   Carnel Stegman 07/03/2023, 11:44 AM

## 2023-07-03 NOTE — Progress Notes (Signed)
Neurosurgery Service Progress Note  Subjective: No acute events overnight. States that he is feeling well without any headaches today.    Objective: Vitals:   07/02/23 2053 07/03/23 0057 07/03/23 0509 07/03/23 0905  BP: 117/61 98/60 (!) 115/58 (!) 102/52  Pulse: 78 72 68 81  Resp: 18 17 18 17   Temp: 97.8 F (36.6 C) 97.6 F (36.4 C) (!) 97.4 F (36.3 C) 97.9 F (36.6 C)  TempSrc: Oral Oral Oral Oral  SpO2: 94% 97% 97% 99%  Weight:      Height:        Physical Exam: Aox3, speech fluent, CN intact, MAE well, incision c/d/I  Assessment & Plan: 67 y.o. male s/p elective Acome clipping, recovering well.  -waiting insurance approval for SNF placement  Emilee Hero, PA-C 07/03/23 9:11 AM

## 2023-07-04 MED ORDER — TRAMADOL HCL 50 MG PO TABS
50.0000 mg | ORAL_TABLET | Freq: Four times a day (QID) | ORAL | Status: DC | PRN
Start: 2023-07-04 — End: 2023-07-04

## 2023-07-04 MED ORDER — TRAMADOL HCL 50 MG PO TABS
50.0000 mg | ORAL_TABLET | Freq: Four times a day (QID) | ORAL | Status: DC | PRN
  Administered 2023-07-05: 50 mg via ORAL
  Filled 2023-07-04: qty 1

## 2023-07-04 NOTE — Progress Notes (Addendum)
Neurosurgery Service Progress Note  Subjective: No acute events overnight. BP was low but asymptomatic only in the AM yesterday and for a few days prior to that, hydralazine was d/c'd and home bP meds were help, last recorded BP is WNL and improved from yesterday. States that he is feeling well from a NSGY standpoint without any headaches today. States that this morning he is having some lower abdomen pressure/discomfort, but has been having regular bowel movements and urinating without difficulty. He states that this has been on and off for the last few days, but it resolves on its own.   Objective: Vitals:   07/03/23 1718 07/03/23 1948 07/03/23 2342 07/04/23 0338  BP: 108/61 109/65 (!) 110/54 122/62  Pulse: 75 81 68 (!) 59  Resp: 17 16 17 18   Temp: 98 F (36.7 C) 97.9 F (36.6 C) 98 F (36.7 C) 97.6 F (36.4 C)  TempSrc: Oral Oral Oral Oral  SpO2: 97% 97% 97% 97%  Weight:      Height:        Physical Exam: Aox3, speech fluent, CN intact, MAE well, incision c/d/I   Assessment & Plan: 67 y.o. male s/p elective Acome clipping, recovering well.   -waiting insurance approval for SNF placement -per his records he has a h/o several chronic GI issues, will continue to monitor his symptoms at this time since they are intermittent and have been resolving without intervention   Emilee Hero, PA-C 07/04/23 7:28 AM

## 2023-07-04 NOTE — Plan of Care (Signed)
  Problem: Education: Goal: Understanding of CV disease, CV risk reduction, and recovery process will improve Outcome: Progressing   Problem: Activity: Goal: Ability to return to baseline activity level will improve Outcome: Progressing   Problem: Cardiovascular: Goal: Ability to achieve and maintain adequate cardiovascular perfusion will improve Outcome: Progressing Goal: Vascular access site(s) Level 0-1 will be maintained Outcome: Progressing   

## 2023-07-05 DIAGNOSIS — F411 Generalized anxiety disorder: Secondary | ICD-10-CM | POA: Insufficient documentation

## 2023-07-05 DIAGNOSIS — F329 Major depressive disorder, single episode, unspecified: Secondary | ICD-10-CM | POA: Insufficient documentation

## 2023-07-05 DIAGNOSIS — R269 Unspecified abnormalities of gait and mobility: Secondary | ICD-10-CM | POA: Insufficient documentation

## 2023-07-05 DIAGNOSIS — M6281 Muscle weakness (generalized): Secondary | ICD-10-CM | POA: Insufficient documentation

## 2023-07-05 NOTE — TOC Transition Note (Signed)
Transition of Care [facility]) - CM/SW Discharge Note   Patient Details  Name: Brian [name] MRN: [id number] Date of Birth: Jan 21, 1956  [facility] Care [facility]) CM/SW Contact:  Baldemar Lenis, LCSW Phone Number: 07/05/2023, 12:47 PM   Clinical Narrative:   CSW received call over the weekend that patient was approved, the appeal was overturned. CSW notified [facility], confirmed they have a bed available for patient. CSW updated daughter, [name], and she is in agreement. CSW sent discharge summary to [facility]. [name] will provide transport for the patient. No further TOC needs.  Nurse to call report to [phone], Room 503-B.    Final next level of care: Skilled Nursing Facility Barriers to Discharge: Barriers Resolved   Patient Goals and CMS Choice CMS Medicare.gov Compare Post Acute Care list provided to:: Patient Represenative (must comment)    Discharge Placement                Patient chooses bed at: [facility] Patient to be transferred to facility by: Family Name of family member notified: Heather Patient and family notified of of transfer: 07/05/23  Discharge Plan and Services Additional resources added to the After Visit Summary for   In-house Referral: Clinical Social Work                                   Social Determinants of Health (SDOH) Interventions SDOH Screenings   Depression (PHQ2-9): High Risk (04/01/2023)  Tobacco Use: High Risk (06/08/2023)     Readmission Risk Interventions     No data to display

## 2023-07-05 NOTE — Discharge Summary (Signed)
Physician Discharge Summary  Patient ID: Brian [name] MRN: [id number] DOB/AGE: 1956-08-22 67 y.o.  Admit date: 06/08/2023 Discharge date: 07/05/2023  Admission Diagnoses:  Cerebral aneurysm  Discharge Diagnoses:  Same Principal Problem:   Cerebral aneurysm Active Problems:   Aneurysm, cerebral, nonruptured   Discharged Condition: Stable  Hospital Course:  Brian [name] is a 67 y.o. 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  The patient did well from a neurologic standpoint, he initially had a fair bit of confusion / delirium.  After monitoring in the intensive care unit as well as multiple imaging studies of the brain which were largely unremarkable, patient's delirium slowly improved.  In speaking with the patient's family, they have noted that in the six months or more leading up to his surgery he has been having delusions even at home.  He was evaluated by Physical and Occupational therapy and what appeared to be doing well from a physical standpoint, he was felt to  benefit from continued therapy prior to return home.  Initially  patient's insurance carrier denied placement at a skilled nursing facility however this was approved on family appeal.  Patient was therefore discharged in stable neurologic condition to skilled nursing facility.  Treatments: Surgery -   Left craniotomy for clipping of anterior communicating artery aneurysm  Discharge Exam: Blood pressure 103/69, pulse 71, temperature 97.9 F (36.6 C), temperature source Oral, resp. rate 17, height 6' (1.829 m), weight 90.7 kg, SpO2 100%. Awake, alert, oriented Speech fluent, appropriate CN grossly intact 5/5 BUE/BLE Wound c/d/i  Disposition: Discharge disposition: 03-Skilled Nursing Facility       Discharge Instructions     Call MD for:  redness, tenderness, or signs of infection (pain, swelling, redness, odor or green/yellow discharge around  incision site)   Complete by: As directed    Call MD for:  temperature >100.4   Complete by: As directed    Diet - low sodium heart healthy   Complete by: As directed    Discharge instructions   Complete by: As directed    Walk at home as much as possible, at least 4 times / day   Increase activity slowly   Complete by: As directed    Lifting restrictions   Complete by: As directed    No lifting > 10 lbs   May shower / Bathe   Complete by: As directed    48 hours after surgery   May walk up steps   Complete by: As directed    No dressing needed   Complete by: As directed    Other Restrictions   Complete by: As directed    No bending/twisting at waist      Allergies as of 07/05/2023       Reactions   Ketorolac Tromethamine Other (See Comments)   Caused renal failure   Dilantin [phenytoin] Nausea And Vomiting   Nsaids    Avoid due to experiencing renal failure with ketorolac   Tylenol [acetaminophen]    Dr instructed to avoid tylenol    Doxycycline Rash   Sulfamethoxazole Rash   Tetracyclines & Related Rash        Medication List     TAKE these medications    Aimovig 140 MG/ML Soaj Generic drug: Erenumab-aooe Inject 140 mg into the skin every 30 (thirty) days.   alfuzosin 10 MG 24 hr tablet Commonly known as: UROXATRAL Take 1 tablet (10 mg total) by mouth daily.  amLODipine 5 MG tablet Commonly known as: NORVASC Take 5 mg by mouth daily.   ascorbic acid 500 MG tablet Commonly known as: VITAMIN C Take 1,000 mg by mouth daily.   aspirin EC 81 MG tablet Take 1 tablet (81 mg total) by mouth daily.   atorvastatin 40 MG tablet Commonly known as: LIPITOR Take 1 tablet (40 mg total) by mouth daily.   azelastine 0.1 % nasal spray Commonly known as: ASTELIN Place 1 spray into both nostrils daily as needed for allergies or rhinitis.   Biotin 5000 5 MG Caps Generic drug: Biotin Take 5 mg by mouth daily.   cyanocobalamin 1000 MCG tablet Commonly known  as: VITAMIN B12 Take 1,000 mcg by mouth daily.   diphenhydrAMINE 25 MG tablet Commonly known as: BENADRYL Take 25 mg by mouth in the morning and at bedtime.   DULoxetine 30 MG capsule Commonly known as: CYMBALTA Take 30 mg by mouth daily.   famotidine 40 MG tablet Commonly known as: PEPCID Take 1 tablet (40 mg total) by mouth 2 (two) times daily.   Fish Oil 1000 MG Caps Take 1,000 mg by mouth daily.   fluticasone 50 MCG/ACT nasal spray Commonly known as: FLONASE Place 1 spray into both nostrils daily as needed for allergies.   gabapentin 800 MG tablet Commonly known as: NEURONTIN Take 1 tablet (800 mg total) by mouth 3 (three) times daily.   ketoconazole 2 % cream Commonly known as: NIZORAL Apply 1 application topically daily as needed for irritation.   losartan 100 MG tablet Commonly known as: COZAAR Take 100 mg by mouth daily.   magnesium oxide 400 (240 Mg) MG tablet Commonly known as: MAG-OX Take 400 mg by mouth daily.   meclizine 25 MG tablet Commonly known as: ANTIVERT Take 25 mg by mouth 2 (two) times daily as needed for dizziness.   MILK THISTLE PO Take 500 mg by mouth daily.   multivitamin with minerals Tabs tablet Take 1 tablet by mouth daily.   ondansetron 8 MG tablet Commonly known as: ZOFRAN Take 1 tablet (8 mg total) by mouth 2 (two) times daily as needed for nausea or vomiting.   pantoprazole 40 MG tablet Commonly known as: PROTONIX Take 40 mg by mouth 2 (two) times daily.   potassium gluconate 595 (99 K) MG Tabs tablet Take 595 mg by mouth daily.   SYSTANE OP Place 1 drop into both eyes daily as needed (dry eyes).   tamsulosin 0.4 MG Caps capsule Commonly known as: FLOMAX Take 0.4 mg by mouth in the morning and at bedtime.   topiramate 100 MG tablet Commonly known as: TOPAMAX Take 100-200 mg by mouth See admin instructions. Take 100 mg by mouth in the morning and 200 mg in the evening   VALIUM PO Take 8 mg by mouth once. Given to  him from daughter to take prior to surgery, pt used to take this, but does not have current prescription               Discharge Care Instructions  (From admission, onward)           Start     Ordered   07/05/23 0000  No dressing needed        07/05/23 1135            Follow-up Information     Lisbeth Renshaw, MD Follow up in 1 month(s).   Specialty: Neurosurgery Contact information: [address] [place] [address]  [phone]                 Signed: Jackelyn Hoehn 07/05/2023, 11:35 AM

## 2023-07-05 NOTE — TOC Progression Note (Signed)
Transition of Care [facility]) - Progression Note    Patient Details  Name: Brian [name] MRN: [id number] Date of Birth: [birth year]  Transition of Care [facility]) CM/SW Contact  Baldemar Lenis, [place] Phone Number: 07/05/2023, 12:46 PM  Clinical Narrative:   CSW contacted [employer] to check on status of appeal. [employer] does not have ROA form that daughter submitted on 10/30. CSW completed new one and had the patient sign, sent to [facility] to support appeal request. Awaiting decision. CSW to follow.    Expected Discharge Plan: Skilled Nursing Facility Barriers to Discharge: Insurance Authorization  Expected Discharge Plan and Services In-house Referral: Clinical Social Work     Living arrangements for the past 2 months: Single Family Home Expected Discharge Date: 07/05/23                                     Social Determinants of Health (SDOH) Interventions SDOH Screenings   Depression (PHQ2-9): High Risk (04/01/2023)  Tobacco Use: High Risk (06/08/2023)    Readmission Risk Interventions     No data to display

## 2023-07-19 ENCOUNTER — Telehealth: Payer: Self-pay

## 2023-07-19 NOTE — Telephone Encounter (Signed)
Copied from CRM [id number]. Topic: Referral - Question >> Jul 16, 2023  4:30 PM Prudencio Pair wrote: Reason for CRM: Steward Drone, with [facility], called stating that they received a referral for patient for OT. She wanted to let Dr. Durwin Nora know that they do not have OT in the area. Her call back number is [phone].

## 2023-07-20 ENCOUNTER — Telehealth: Payer: Self-pay

## 2023-07-20 NOTE — Telephone Encounter (Signed)
Left message for Brian [name] to call our office

## 2023-07-20 NOTE — Telephone Encounter (Signed)
Copied from CRM [id number]. Topic: Clinical - Home Health Verbal Orders >> Jul 20, 2023 10:01 AM Brian [name] wrote: Reason for CRM: Olegario Messier from [facility] care needs to speak with Dr. Durwin Nora or nurse as soon as possible regarding PT's medication - PT recently had an aneurysm and a craniotomy but family states PT can NOT take opiates but PT has a tylenol allergy Olegario Messier has a few questions regarding PT's other medications- Please call her back at [phone]

## 2023-07-22 ENCOUNTER — Other Ambulatory Visit: Payer: Self-pay | Admitting: Internal Medicine

## 2023-08-03 DIAGNOSIS — F32A Depression, unspecified: Secondary | ICD-10-CM

## 2023-08-03 DIAGNOSIS — M1990 Unspecified osteoarthritis, unspecified site: Secondary | ICD-10-CM

## 2023-08-03 DIAGNOSIS — K760 Fatty (change of) liver, not elsewhere classified: Secondary | ICD-10-CM

## 2023-08-03 DIAGNOSIS — K3184 Gastroparesis: Secondary | ICD-10-CM | POA: Diagnosis not present

## 2023-08-03 DIAGNOSIS — I10 Essential (primary) hypertension: Secondary | ICD-10-CM | POA: Diagnosis not present

## 2023-08-03 DIAGNOSIS — F419 Anxiety disorder, unspecified: Secondary | ICD-10-CM

## 2023-08-03 DIAGNOSIS — K5790 Diverticulosis of intestine, part unspecified, without perforation or abscess without bleeding: Secondary | ICD-10-CM

## 2023-08-03 DIAGNOSIS — K219 Gastro-esophageal reflux disease without esophagitis: Secondary | ICD-10-CM

## 2023-08-03 DIAGNOSIS — I671 Cerebral aneurysm, nonruptured: Secondary | ICD-10-CM | POA: Diagnosis not present

## 2023-08-03 DIAGNOSIS — F17210 Nicotine dependence, cigarettes, uncomplicated: Secondary | ICD-10-CM

## 2023-08-03 DIAGNOSIS — E785 Hyperlipidemia, unspecified: Secondary | ICD-10-CM

## 2023-08-03 DIAGNOSIS — E1143 Type 2 diabetes mellitus with diabetic autonomic (poly)neuropathy: Secondary | ICD-10-CM | POA: Diagnosis not present

## 2023-08-05 ENCOUNTER — Ambulatory Visit: Payer: Medicare HMO | Admitting: Internal Medicine

## 2023-08-05 ENCOUNTER — Encounter: Payer: Self-pay | Admitting: Internal Medicine

## 2023-08-05 VITALS — BP 110/60 | HR 74 | Ht 72.0 in | Wt 199.0 lb

## 2023-08-05 DIAGNOSIS — N138 Other obstructive and reflux uropathy: Secondary | ICD-10-CM

## 2023-08-05 DIAGNOSIS — I671 Cerebral aneurysm, nonruptured: Secondary | ICD-10-CM

## 2023-08-05 DIAGNOSIS — E1169 Type 2 diabetes mellitus with other specified complication: Secondary | ICD-10-CM

## 2023-08-05 DIAGNOSIS — E11618 Type 2 diabetes mellitus with other diabetic arthropathy: Secondary | ICD-10-CM

## 2023-08-05 DIAGNOSIS — E785 Hyperlipidemia, unspecified: Secondary | ICD-10-CM

## 2023-08-05 DIAGNOSIS — K219 Gastro-esophageal reflux disease without esophagitis: Secondary | ICD-10-CM

## 2023-08-05 DIAGNOSIS — N401 Enlarged prostate with lower urinary tract symptoms: Secondary | ICD-10-CM

## 2023-08-05 DIAGNOSIS — H02402 Unspecified ptosis of left eyelid: Secondary | ICD-10-CM | POA: Diagnosis not present

## 2023-08-05 DIAGNOSIS — F32A Depression, unspecified: Secondary | ICD-10-CM

## 2023-08-05 DIAGNOSIS — I10 Essential (primary) hypertension: Secondary | ICD-10-CM

## 2023-08-05 DIAGNOSIS — Z23 Encounter for immunization: Secondary | ICD-10-CM

## 2023-08-05 DIAGNOSIS — F419 Anxiety disorder, unspecified: Secondary | ICD-10-CM

## 2023-08-05 HISTORY — DX: Unspecified ptosis of left eyelid: H02.402

## 2023-08-05 NOTE — Assessment & Plan Note (Signed)
Symptoms remain adequately controlled with Pepcid.

## 2023-08-05 NOTE — Assessment & Plan Note (Signed)
Symptoms remain adequately controlled with Uroxatrol and Flomax.

## 2023-08-05 NOTE — Assessment & Plan Note (Signed)
 Remains adequately controlled on current antihypertensive regimen.  No medication changes are indicated today.

## 2023-08-05 NOTE — Assessment & Plan Note (Signed)
Mood remains stable and anxiety adequately controlled with Cymbalta.
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
## 2023-08-05 NOTE — Assessment & Plan Note (Signed)
Lipid panel updated in August.  Total cholesterol 159 and LDL 94.  Simvastatin was discontinued in favor of atorvastatin 40 mg daily in light of this result. -Repeat lipid panel at follow-up in 3 months.

## 2023-08-05 NOTE — Assessment & Plan Note (Signed)
Recently seen by neurosurgery for follow-up and was noted to have ptosis of the left upper eyelid.  No documentation is available for review, however the patient's sister reports that he has been referred to ENT for evaluation.

## 2023-08-05 NOTE — Patient Instructions (Signed)
It was a pleasure to see you today.  Thank you for giving [place] the opportunity to be involved in your care.  Below is a brief recap of your visit and next steps.  We will plan to see you again in 3 months.  Summary No medication changes today Flu shot administered Follow up in 3 months

## 2023-08-05 NOTE — Assessment & Plan Note (Signed)
Diet controlled.  A1c 6.0 on labs from late September.  No medication changes are indicated today.

## 2023-08-05 NOTE — Assessment & Plan Note (Signed)
Influenza vaccine administered today.

## 2023-08-05 NOTE — Assessment & Plan Note (Signed)
S/p frontotemporal craniotomy with clipping of ACA aneurysm on 10/8.  Hospital course complicated by delirium.  Recently discharged from rehabilitation.  He endorses dull discomfort in the left frontotemporal region today.  Left eyelid ptosis noted on exam.  Per patient's sister, he has been referred to ENT for further evaluation.

## 2023-08-10 ENCOUNTER — Ambulatory Visit (INDEPENDENT_AMBULATORY_CARE_PROVIDER_SITE_OTHER): Payer: Medicare HMO | Admitting: Gastroenterology

## 2023-08-10 ENCOUNTER — Ambulatory Visit: Payer: Medicare HMO | Admitting: Neurology

## 2023-08-10 ENCOUNTER — Encounter: Payer: Self-pay | Admitting: Neurology

## 2023-08-10 VITALS — BP 111/68 | HR 77 | Ht 72.0 in | Wt 200.8 lb

## 2023-08-10 DIAGNOSIS — G43009 Migraine without aura, not intractable, without status migrainosus: Secondary | ICD-10-CM

## 2023-08-10 DIAGNOSIS — R413 Other amnesia: Secondary | ICD-10-CM

## 2023-08-10 DIAGNOSIS — R569 Unspecified convulsions: Secondary | ICD-10-CM | POA: Diagnosis not present

## 2023-08-10 MED ORDER — NORTRIPTYLINE HCL 25 MG PO CAPS: 25.0000 mg | ORAL_CAPSULE | Freq: Every day | ORAL | 6 refills | Status: DC

## 2023-08-10 NOTE — Patient Instructions (Signed)
Good to see you.  Start Nortriptyline 25mg : take 1 capsule every night to help with sleep and headaches  2. Stop Benadryl  3. Referral will be sent for Speech Therapy  4. Follow-up in 3-4 months, call for any changes
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
## 2023-10-06 NOTE — Telephone Encounter (Signed)
 Erroneous encounter

## 2023-11-03 ENCOUNTER — Ambulatory Visit: Payer: Medicare HMO | Admitting: Internal Medicine

## 2023-11-08 ENCOUNTER — Encounter: Payer: Self-pay | Admitting: Neurology

## 2023-11-08 ENCOUNTER — Ambulatory Visit (INDEPENDENT_AMBULATORY_CARE_PROVIDER_SITE_OTHER): Payer: Medicare HMO | Admitting: Neurology

## 2023-11-08 VITALS — BP 136/79 | HR 92 | Ht 72.0 in | Wt 216.4 lb

## 2023-11-08 DIAGNOSIS — R413 Other amnesia: Secondary | ICD-10-CM

## 2023-11-08 DIAGNOSIS — I729 Aneurysm of unspecified site: Secondary | ICD-10-CM | POA: Diagnosis not present

## 2023-11-08 DIAGNOSIS — Z8673 Personal history of transient ischemic attack (TIA), and cerebral infarction without residual deficits: Secondary | ICD-10-CM

## 2023-11-08 DIAGNOSIS — R569 Unspecified convulsions: Secondary | ICD-10-CM | POA: Diagnosis not present

## 2023-11-08 MED ORDER — NORTRIPTYLINE HCL 25 MG PO CAPS: ORAL_CAPSULE | ORAL | 3 refills | Status: DC

## 2023-11-08 MED ORDER — TOPIRAMATE 100 MG PO TABS: ORAL_TABLET | ORAL | 3 refills | Status: DC
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
## 2023-11-08 NOTE — Patient Instructions (Addendum)
 Good to see you doing better.  Schedule MRI brain with and without contrast at [facility]  2. Increase Nortriptyline 25mg : take 2 capsules every night  3. Continue Topiramate 100mg : Take 1 tablet in AM, 2 tablets in PM  4. The Aimovig monthly injections are also to cut down on headaches  5. Keep a calendar of headaches and also medication intake to help as a reminder

## 2023-12-13 ENCOUNTER — Other Ambulatory Visit: Payer: Self-pay | Admitting: Otolaryngology

## 2023-12-22 ENCOUNTER — Ambulatory Visit (HOSPITAL_COMMUNITY): Admission: RE | Admit: 2023-12-22 | Source: Home / Self Care | Admitting: Otolaryngology

## 2023-12-22 SURGERY — BLEPHAROPLASTY
Anesthesia: Monitor Anesthesia Care | Laterality: Bilateral

## 2023-12-27 ENCOUNTER — Ambulatory Visit (INDEPENDENT_AMBULATORY_CARE_PROVIDER_SITE_OTHER): Admitting: Internal Medicine

## 2023-12-27 ENCOUNTER — Encounter: Payer: Self-pay | Admitting: Internal Medicine

## 2023-12-27 ENCOUNTER — Telehealth (INDEPENDENT_AMBULATORY_CARE_PROVIDER_SITE_OTHER): Payer: Self-pay

## 2023-12-27 VITALS — BP 109/65 | HR 129 | Ht 72.0 in | Wt 197.2 lb

## 2023-12-27 DIAGNOSIS — F419 Anxiety disorder, unspecified: Secondary | ICD-10-CM | POA: Diagnosis not present

## 2023-12-27 DIAGNOSIS — E11618 Type 2 diabetes mellitus with other diabetic arthropathy: Secondary | ICD-10-CM

## 2023-12-27 DIAGNOSIS — E1169 Type 2 diabetes mellitus with other specified complication: Secondary | ICD-10-CM

## 2023-12-27 DIAGNOSIS — F32A Depression, unspecified: Secondary | ICD-10-CM

## 2023-12-27 DIAGNOSIS — G6289 Other specified polyneuropathies: Secondary | ICD-10-CM

## 2023-12-27 DIAGNOSIS — G4701 Insomnia due to medical condition: Secondary | ICD-10-CM | POA: Diagnosis not present

## 2023-12-27 DIAGNOSIS — E785 Hyperlipidemia, unspecified: Secondary | ICD-10-CM

## 2023-12-27 MED ORDER — GABAPENTIN 800 MG PO TABS: 800.0000 mg | ORAL_TABLET | Freq: Three times a day (TID) | ORAL | 3 refills | Status: AC

## 2023-12-27 NOTE — Telephone Encounter (Signed)
 Patient [phone] came by the office today,and asked if he needed to stay on Carafate ? If so he will need a script sent into [facility]. Please advise. Thanks

## 2023-12-27 NOTE — Patient Instructions (Signed)
 It was a pleasure to see you today.  Thank you for giving us  the opportunity to be involved in your care.  Below is a brief recap of your visit and next steps.  We will plan to see you again in 3 months.  Summary I recommend increasing nortriptyline  as recommended by Dr. Ty Gales Repeat labs Follow up in 3 months
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
## 2023-12-27 NOTE — Telephone Encounter (Signed)
 He can stop this medication.  If he feels his symptoms are getting worse after stopping this, he should let us  know so we can refill this medication.

## 2023-12-28 ENCOUNTER — Encounter: Payer: Self-pay | Admitting: Internal Medicine

## 2023-12-28 ENCOUNTER — Encounter (INDEPENDENT_AMBULATORY_CARE_PROVIDER_SITE_OTHER): Payer: Self-pay

## 2023-12-28 ENCOUNTER — Other Ambulatory Visit (INDEPENDENT_AMBULATORY_CARE_PROVIDER_SITE_OTHER): Payer: Self-pay | Admitting: Gastroenterology

## 2023-12-28 DIAGNOSIS — G4700 Insomnia, unspecified: Secondary | ICD-10-CM

## 2023-12-28 DIAGNOSIS — Z8669 Personal history of other diseases of the nervous system and sense organs: Secondary | ICD-10-CM

## 2023-12-28 HISTORY — DX: Insomnia, unspecified: G47.00

## 2023-12-28 HISTORY — DX: Personal history of other diseases of the nervous system and sense organs: Z86.69

## 2023-12-28 LAB — CMP14+EGFR
ALT: 39 IU/L (ref 0–44)
AST: 22 IU/L (ref 0–40)
Albumin: 4.8 g/dL (ref 3.9–4.9)
Alkaline Phosphatase: 94 IU/L (ref 44–121)
BUN/Creatinine Ratio: 24 (ref 10–24)
BUN: 30 mg/dL — ABNORMAL HIGH (ref 8–27)
Bilirubin Total: 0.3 mg/dL (ref 0.0–1.2)
CO2: 18 mmol/L — ABNORMAL LOW (ref 20–29)
Calcium: 9.8 mg/dL (ref 8.6–10.2)
Chloride: 111 mmol/L — ABNORMAL HIGH (ref 96–106)
Creatinine, Ser: 1.27 mg/dL (ref 0.76–1.27)
Globulin, Total: 2.7 g/dL (ref 1.5–4.5)
Glucose: 115 mg/dL — ABNORMAL HIGH (ref 70–99)
Potassium: 4.9 mmol/L (ref 3.5–5.2)
Sodium: 147 mmol/L — ABNORMAL HIGH (ref 134–144)
Total Protein: 7.5 g/dL (ref 6.0–8.5)
eGFR: 62 mL/min/{1.73_m2} (ref >59)

## 2023-12-28 LAB — LIPID PANEL
Chol/HDL Ratio: 10.2 ratio — ABNORMAL HIGH (ref 0.0–5.0)
Cholesterol, Total: 266 mg/dL — ABNORMAL HIGH (ref 100–199)
HDL: 26 mg/dL — ABNORMAL LOW (ref >39)
LDL Chol Calc (NIH): 155 mg/dL — ABNORMAL HIGH (ref 0–99)
Triglycerides: 443 mg/dL — ABNORMAL HIGH (ref 0–149)
VLDL Cholesterol Cal: 85 mg/dL — ABNORMAL HIGH (ref 5–40)

## 2023-12-28 LAB — CBC WITH DIFFERENTIAL/PLATELET
Basophils Absolute: 0.1 10*3/uL (ref 0.0–0.2)
Basos: 1 %
EOS (ABSOLUTE): 0.2 10*3/uL (ref 0.0–0.4)
Eos: 2 %
Hematocrit: 46.4 % (ref 37.5–51.0)
Hemoglobin: 15.2 g/dL (ref 13.0–17.7)
Immature Grans (Abs): 0 10*3/uL (ref 0.0–0.1)
Immature Granulocytes: 0 %
Lymphocytes Absolute: 2.9 10*3/uL (ref 0.7–3.1)
Lymphs: 28 %
MCH: 31.7 pg (ref 26.6–33.0)
MCHC: 32.8 g/dL (ref 31.5–35.7)
MCV: 97 fL (ref 79–97)
Monocytes Absolute: 1.1 10*3/uL — ABNORMAL HIGH (ref 0.1–0.9)
Monocytes: 11 %
Neutrophils Absolute: 6 10*3/uL (ref 1.4–7.0)
Neutrophils: 58 %
Platelets: 247 10*3/uL (ref 150–450)
RBC: 4.79 x10E6/uL (ref 4.14–5.80)
RDW: 13 % (ref 11.6–15.4)
WBC: 10.3 10*3/uL (ref 3.4–10.8)

## 2023-12-28 LAB — HEMOGLOBIN A1C
Est. average glucose Bld gHb Est-mCnc: 126 mg/dL
Hgb A1c MFr Bld: 6 % — ABNORMAL HIGH (ref 4.8–5.6)

## 2023-12-28 MED ORDER — SUCRALFATE 1 G PO TABS: 1.0000 g | ORAL_TABLET | Freq: Three times a day (TID) | ORAL | 1 refills | Status: DC

## 2023-12-28 MED ORDER — ATORVASTATIN CALCIUM 40 MG PO TABS: 40.0000 mg | ORAL_TABLET | Freq: Every day | ORAL | 3 refills | Status: AC

## 2023-12-28 NOTE — Assessment & Plan Note (Signed)
 Stable.  He continues to take Cymbalta  30 mg daily.

## 2023-12-28 NOTE — Assessment & Plan Note (Signed)
 Lipid panel last updated in August 2024.  Total cholesterol 159 and LDL 94.  Simvastatin  was discontinued in favor of atorvastatin  40 mg daily in light of this result.  Repeat lipid panel ordered today.

## 2023-12-28 NOTE — Assessment & Plan Note (Signed)
 Symptoms remain well-controlled with gabapentin  800 mg 3 times daily.  Refill provided today.

## 2023-12-28 NOTE — Assessment & Plan Note (Signed)
 His acute concern today is insomnia.  He is prescribed Topamax  and nortriptyline  for migraine prophylaxis.  Nortriptyline  was recently increased to 50 mg nightly for improved management of insomnia.  He has not tried taking increased dose due to concern for adverse side effects.  We reviewed indications for increasing nortriptyline  and addressed his concerns about taking the increased dose.  I agree with neurology recommendations that he try nortriptyline  50 mg nightly.

## 2023-12-28 NOTE — Assessment & Plan Note (Signed)
 Diet controlled.  A1c 6.0 on labs from September.  Repeat A1c ordered today.

## 2023-12-28 NOTE — Telephone Encounter (Signed)
 Medication sent to pharmacy. Thanks!

## 2023-12-28 NOTE — Telephone Encounter (Signed)
 Patient would like for this medication to be filled and sent to [facility] pharmacy.

## 2023-12-29 NOTE — Telephone Encounter (Signed)
 Patient made aware.

## 2024-01-25 ENCOUNTER — Ambulatory Visit

## 2024-02-07 ENCOUNTER — Ambulatory Visit (INDEPENDENT_AMBULATORY_CARE_PROVIDER_SITE_OTHER): Admitting: Orthopedic Surgery

## 2024-02-07 ENCOUNTER — Encounter: Payer: Self-pay | Admitting: Orthopedic Surgery

## 2024-02-07 VITALS — BP 144/90 | HR 72 | Ht 72.0 in | Wt 200.0 lb

## 2024-02-07 DIAGNOSIS — M4807 Spinal stenosis, lumbosacral region: Secondary | ICD-10-CM
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
## 2024-02-07 NOTE — Progress Notes (Signed)
  Intake history:  BP (!) 144/90   Pulse 72   Ht 6' (1.829 m)   Wt 200 lb (90.7 kg)   BMI 27.12 kg/m  Body mass index is 27.12 kg/m.    WHAT ARE WE SEEING YOU FOR TODAY?   back - lumbar/sacral  Radiation?: yes - left hip to inside of leg/ ankle at times.   Loss of bowel/urine control?  no  How [place] has this bothered you? (DOI?DOS?WS?)  Since 2010  Anticoag.  No  Diabetes Yes/states prediabetes diet controlled   Heart disease No  Hypertension Yes  SMOKING HX Yes  Kidney disease yes  Any ALLERGIES ________ Allergies  Allergen Reactions   Ketorolac  Tromethamine  Other (See Comments)    Caused renal failure   Dilantin [Phenytoin] Nausea And Vomiting   Nsaids     Avoid due to experiencing renal failure with ketorolac    Tylenol  [Acetaminophen ]     Dr instructed to avoid tylenol     Doxycycline  Rash   Sulfamethoxazole Rash   Tetracyclines & Related Rash   ______________________________________   Treatment:  Have you taken:  Tylenol  Yes  Advil  Yes  Had PT No  Had injection No  Other  _________________________

## 2024-02-23 ENCOUNTER — Other Ambulatory Visit: Payer: Self-pay | Admitting: Neurosurgery

## 2024-02-23 DIAGNOSIS — I671 Cerebral aneurysm, nonruptured: Secondary | ICD-10-CM

## 2024-02-29 ENCOUNTER — Telehealth: Payer: Self-pay

## 2024-02-29 ENCOUNTER — Other Ambulatory Visit: Payer: Self-pay | Admitting: Orthopedic Surgery

## 2024-02-29 DIAGNOSIS — M4807 Spinal stenosis, lumbosacral region: Secondary | ICD-10-CM

## 2024-02-29 NOTE — Telephone Encounter (Signed)
 [place] is in [place]  No one goes there

## 2024-02-29 NOTE — Telephone Encounter (Signed)
 I sent orders for [facility] will call him

## 2024-02-29 NOTE — Telephone Encounter (Signed)
 Looks like I forgot to put in the order or Dr Margrette forgot to let me know he needed it. Either way it wasn't put in, I called him to apologize give him the number to call and schedule  [phone]

## 2024-02-29 NOTE — Telephone Encounter (Addendum)
 Patient called asking about his referral to physical therapy. Stated he was told by Dr. Margrette he was being referred but he hasn't heard anything.  He wants a return call at [phone]

## 2024-02-29 NOTE — Telephone Encounter (Signed)
 Patient left message stating that Dr. Margrette knows that he needs physical therapy to come out to his home.  He would like a return call to discuss this [phone]

## 2024-03-01 NOTE — Telephone Encounter (Signed)
 I called him to advise [facility] not available for him  He said Dr Margrette told him someone would come out He is in [facility] He is angry To you FYI

## 2024-03-14 ENCOUNTER — Encounter: Payer: Self-pay | Admitting: Neurology

## 2024-03-14 ENCOUNTER — Ambulatory Visit: Admitting: Neurology

## 2024-03-14 VITALS — BP 133/67 | HR 68 | Ht 72.0 in | Wt 204.0 lb

## 2024-03-14 DIAGNOSIS — G43009 Migraine without aura, not intractable, without status migrainosus: Secondary | ICD-10-CM | POA: Diagnosis not present

## 2024-03-14 DIAGNOSIS — R569 Unspecified convulsions: Secondary | ICD-10-CM

## 2024-03-14 DIAGNOSIS — R413 Other amnesia: Secondary | ICD-10-CM | POA: Diagnosis not present

## 2024-03-14 MED ORDER — NORTRIPTYLINE HCL 25 MG PO CAPS: ORAL_CAPSULE | ORAL | 3 refills | Status: DC

## 2024-03-14 MED ORDER — TOPIRAMATE 100 MG PO TABS: ORAL_TABLET | ORAL | 3 refills | Status: DC

## 2024-03-14 MED ORDER — AIMOVIG 140 MG/ML ~~LOC~~ SOAJ: 1.0000 | SUBCUTANEOUS | 11 refills | Status: DC
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
## 2024-03-14 NOTE — Patient Instructions (Addendum)
 Good to see you.  Schedule Neurocognitive evaluation  2. Restart Aimovig  injection once a month. Continue all your other medications  3. Consider seeing a psychiatrist for depression and anxiety  4. Continue working on smoking cessation  5. Follow-up in 5 months, call for any changes   You have been referred for a neurocognitive evaluation in our office.   The evaluation has two parts.   The first part of the evaluation is a clinical interview with the neuropsychologist (Dr. Richie or Dr. Gayland). Please bring someone with you to this appointment if possible, as it is helpful for the doctor to hear from both you and another adult who knows you well.   The second part of the evaluation is testing with the doctor's technician Neal or Luke). The testing includes a variety of tasks- mostly question-and-answer, some paper-and-pencil. There is nothing you need to do to prepare for this appointment, but having a good night's sleep prior to the testing, taking medications as you normally would, and bringing eyeglasses and hearing aids (if you wear them), is advised. Please make sure that you wear a mask to the appointment.  Please note: We have to reserve several hours of the neuropsychologist's time and the psychometrician's time for your evaluation appointment. As such, please note that there is a No-Show fee of $100. If you are unable to attend any of your appointments, please contact our office as soon as possible to reschedule.

## 2024-04-03 ENCOUNTER — Ambulatory Visit

## 2024-04-03 ENCOUNTER — Telehealth: Payer: Self-pay

## 2024-04-03 NOTE — Telephone Encounter (Unsigned)
 Copied from CRM #[id number]. Topic: Clinical - Pink Word Triage >> Apr 03, 2024 10:30 AM Turkey B wrote: Reason for Triage: pt has diarrhea

## 2024-04-04 ENCOUNTER — Institutional Professional Consult (permissible substitution): Admitting: Psychology

## 2024-04-04 ENCOUNTER — Ambulatory Visit: Payer: Self-pay

## 2024-04-04 ENCOUNTER — Encounter: Payer: Self-pay | Admitting: Psychology

## 2024-04-10 ENCOUNTER — Institutional Professional Consult (permissible substitution): Admitting: Psychology

## 2024-04-10 ENCOUNTER — Ambulatory Visit

## 2024-04-11 ENCOUNTER — Encounter: Admitting: Psychology

## 2024-04-13 ENCOUNTER — Ambulatory Visit
Admission: RE | Admit: 2024-04-13 | Discharge: 2024-04-13 | Disposition: A | Discharge status: Home / Self Care | Source: Ambulatory Visit | Attending: Neurosurgery | Admitting: Neurosurgery

## 2024-04-13 DIAGNOSIS — I671 Cerebral aneurysm, nonruptured: Secondary | ICD-10-CM

## 2024-04-13 DIAGNOSIS — G9389 Other specified disorders of brain: Secondary | ICD-10-CM | POA: Diagnosis not present

## 2024-04-13 MED ORDER — IOPAMIDOL (ISOVUE-370) INJECTION 76%
60.0000 mL | Freq: Once | INTRAVENOUS | Status: AC | PRN
Start: 2024-04-13 — End: 2024-04-13
  Administered 2024-04-13: 60 mL via INTRAVENOUS

## 2024-04-18 ENCOUNTER — Encounter: Payer: Self-pay | Admitting: Psychology

## 2024-04-18 ENCOUNTER — Ambulatory Visit: Admitting: Psychology

## 2024-04-18 ENCOUNTER — Encounter: Admitting: Psychology

## 2024-04-18 DIAGNOSIS — R4189 Other symptoms and signs involving cognitive functions and awareness: Secondary | ICD-10-CM

## 2024-04-18 DIAGNOSIS — F0670 Mild neurocognitive disorder due to known physiological condition without behavioral disturbance: Secondary | ICD-10-CM | POA: Diagnosis not present

## 2024-04-18 DIAGNOSIS — I639 Cerebral infarction, unspecified: Secondary | ICD-10-CM | POA: Insufficient documentation

## 2024-04-18 DIAGNOSIS — G4733 Obstructive sleep apnea (adult) (pediatric): Secondary | ICD-10-CM | POA: Insufficient documentation

## 2024-04-18 DIAGNOSIS — R419 Unspecified symptoms and signs involving cognitive functions and awareness: Secondary | ICD-10-CM | POA: Diagnosis not present

## 2024-04-18 NOTE — Progress Notes (Addendum)
   Psychometrician Note   Cognitive testing was administered to Brian [name] by Lonell Jude, B.S. (psychometrist) under the supervision of Dr. Arthea KYM Maryland, Ph.D., ABPP, licensed psychologist on 04/18/2024. Brian [name] did appear overtly distressed by the testing session per behavioral observation or responses across self-report questionnaires. Rest breaks were offered. Patient will return due to upset stomach and anxiety to complete the battery of tests on Friday, June 23, 2024 at 1:00 p.m.   The battery of tests administered was selected by Dr. Arthea KYM Maryland, Ph.D., ABPP with consideration to Brian [name] current level of functioning, the nature of his symptoms, emotional and behavioral responses during interview, level of literacy, observed level of motivation/effort, and the nature of the referral question. This battery was communicated to the psychometrist. Communication between Dr. Arthea KYM Maryland, Ph.D., ABPP and the psychometrist was ongoing throughout the evaluation and Dr. Arthea KYM Maryland, Ph.D., ABPP was immediately accessible at all times. Dr. Zachary C. Merz, Ph.D., ABPP provided supervision to the psychometrist on the date of this service to the extent necessary to assure the quality of all services provided.    Brian [name] will return within approximately 1-2 weeks for an interactive feedback session with Dr. Maryland at which time his test performances, clinical impressions, and treatment recommendations will be reviewed in detail. Brian [name] understands he can contact our office should he require our assistance before this time.  A total of 50 minutes of billable time were spent face-to-face with Brian [name] by the psychometrist. This includes both test administration and scoring time. Billing for these services is reflected in the clinical report generated by Dr. Arthea KYM Maryland, Ph.D., ABPP  This note reflects time spent with the psychometrician and does not include test scores or  any clinical interpretations made by Dr. Maryland. The full report will follow in a separate note.

## 2024-04-18 NOTE — Progress Notes (Signed)
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   Evaluation Results:   Behavioral Observations: Mr. Rolph was unaccompanied, arrived to his appointment on time, and was appropriately dressed and groomed. He appeared alert. Observed gait and station were within normal limits. Gross motor functioning appeared intact upon informal observation and no abnormal movements (e.g., tremors) were noted. His affect was generally relaxed and positive. Spontaneous speech was fluent. Mild stumbling behaviors were exhibited while speaking at times. Dempsey word finding difficulties were not observed during interview. Thought processes were coherent, organized, and normal in content. Insight into his cognitive difficulties was unable to be determined given that Mr. Fawaz was unable to complete the testing portion of the current evaluation (see  below).  Initially, testing tolerance was appropriate. However, upon struggling across a phonemic fluency task, Mr. Ingalsbe abruptly expressed feeling sick to my stomach and expressed a desire to discontinue the evaluation. It was unclear how much of this was due to an abrupt spike in anxiety due to perceived poor performance. A break was taken but he was ultimately unwilling to persist with the present evaluation. As such, it was halted.  Adequacy of Effort: The validity of neuropsychological testing is limited by the extent to which the individual being tested may  be assumed to have exerted adequate effort during testing. Mr. Greenley expressed his intention to perform to the best of his abilities and exhibited adequate task engagement and persistence. Scores across stand-alone performance validity measures were variable. As such, the results of obtained performances should be interpreted with caution and may underestimate true abilities to an unknown degree.  Test Results: Mr. Lagrand was fully oriented at the time of the current evaluation.  Intellectual abilities based upon educational and vocational attainment were estimated to be in the below average to average range. Premorbid abilities were estimated to be within the below average range based upon a single-word reading test.   Processing speed was well below average to below average. Basic attention was unable to be assessed. More complex attention (e.g., working memory) was unable to be assessed. Performance across a single task assessing visuomotor cognitive flexibility was exceptionally low. Other aspects of executive functioning were unable to be assessed.  Receptive language abilities were unable to be assessed. Mr. Boedecker did not exhibit any difficulties comprehending task instructions and answered all questions asked of him appropriately. Assessed expressive language was variable. Phonemic fluency was exceptionally low to well below average and  semantic fluency was average. Confrontation naming was unable to be assessed.      Clock drawing was appropriate. Points were lost due to incorrect hand placement. Other visuospatial/visuoconstructional abilities were unable to be assessed.    Learning (i.e., encoding) of novel verbal and visual information was unable to be assessed. Spontaneous delayed recall (i.e., retrieval) of previously learned information was also unable to be assessed.  Table of Scores:   Note: This summary of test scores accompanies the interpretive report and should not be considered in isolation without reference to the appropriate sections in the text. Descriptors are based on appropriate normative data and may be adjusted based on clinical judgment. Terms such as Within Normal Limits and Outside Normal Limits are used when a more specific description of the test score cannot be determined.       Percentile - Normative Descriptor > 98 - Exceptionally High 91-97 - Well Above Average 75-90 - Above Average 25-74 - Average 9-24 - Below Average 2-8 - Well Below Average < 2 - Exceptionally Low       Validity:   DESCRIPTOR       ACS WC: --- --- Outside Normal Limits  DCT: --- --- Within Normal Limits       Orientation:      Raw Score Percentile   NAB Orientation, Form 1 29/29 --- ---       Cognitive Screening:      Raw Score Percentile   SLUMS: 23/30 --- ---       Intellectual Functioning:      Standard Score Percentile   Test of Premorbid Functioning: 87 19 Below Average       Attention/Executive Function:     Trail Making Test (TMT): Raw Score (T Score) Percentile     Part A 33 secs.,  0 errors (56) 73 Average    Part B Discontinued --- Impaired         Scaled Score Percentile   WAIS-5 Coding: 5 5 Well Below Average  WAIS-5 Naming Speed Quantity: 7 16 Below Average       D-KEFS Verbal Fluency Test: Raw Score (Scaled Score) Percentile     Letter Total Correct 13 (3) 1 Exceptionally Low     Category Total Correct Discontinued (testing tolerance) --- ---  Category Switching Total Correct Not attempted --- ---    Category Switching Accuracy --- --- ---       Language:     Verbal Fluency Test: Raw Score (T Score) Percentile     Phonemic Fluency (FAS) 13 (28) 2 Well Below Average    Animal Fluency 20 (56) 73 Average        Visuospatial/Visuoconstruction:      Raw Score Percentile   Clock Drawing: 8/10 --- Within Normal Limits   Informed Consent and Coding/Compliance:   The current evaluation represents a clinical evaluation for the purposes previously outlined by the referral source and is in no way reflective of a forensic evaluation.   Mr. Shepard was provided with a verbal description of the nature and purpose of the present neuropsychological evaluation. Also reviewed were the foreseeable risks and/or discomforts and benefits of the procedure, limits of confidentiality, and mandatory reporting requirements of this provider. The patient was given the opportunity to ask questions and receive answers about the evaluation. Oral consent to participate was provided by the patient.   This evaluation was conducted by Arthea KYM Maryland, Ph.D., ABPP-CN, board certified clinical neuropsychologist. Mr. Guyton completed a clinical interview with Dr. Maryland, billed as one unit [phone], and 50 minutes of cognitive testing and scoring, billed as one unit [phone] and one additional unit [phone]. Psychometrist Lonell Jude, B.S. assisted Dr. Maryland with test administration and scoring procedures. As a separate and discrete service, one unit [id number] (60 minutes) were billed for Dr. Loralee time spent in interpretation and report writing.

## 2024-04-27 ENCOUNTER — Telehealth: Payer: Self-pay | Admitting: Neurology

## 2024-04-27 ENCOUNTER — Other Ambulatory Visit: Payer: Self-pay

## 2024-04-27 ENCOUNTER — Encounter: Admitting: Psychology

## 2024-04-27 DIAGNOSIS — I671 Cerebral aneurysm, nonruptured: Secondary | ICD-10-CM | POA: Diagnosis not present

## 2024-04-27 MED ORDER — NORTRIPTYLINE HCL 25 MG PO CAPS: ORAL_CAPSULE | ORAL | 1 refills | Status: DC

## 2024-04-27 NOTE — Telephone Encounter (Signed)
 Sent rx in to pharmacy

## 2024-04-27 NOTE — Telephone Encounter (Signed)
 Spoke to the patient sister she is unsure if the pharmacy has the script from 03/14/24. She never called.   Patient will get a refill, if she has any issues she will call the office back.

## 2024-04-27 NOTE — Telephone Encounter (Signed)
 Pt sister states that the nortriptyline  needs to be sent to [facility]

## 2024-05-13 ENCOUNTER — Other Ambulatory Visit: Payer: Self-pay | Admitting: Internal Medicine

## 2024-05-26 ENCOUNTER — Other Ambulatory Visit (INDEPENDENT_AMBULATORY_CARE_PROVIDER_SITE_OTHER): Payer: Self-pay | Admitting: Gastroenterology

## 2024-06-02 ENCOUNTER — Other Ambulatory Visit: Payer: Self-pay

## 2024-06-02 NOTE — Telephone Encounter (Signed)
 Copied from CRM [id number]. Topic: Clinical - Medication Refill >> Jun 02, 2024  1:53 PM Rosaria E wrote: Medication: tamsulosin  (FLOMAX ) 0.4 MG CAPS capsule  Has the patient contacted their pharmacy? Yes (Agent: If no, request that the patient contact the pharmacy for the refill. If patient does not wish to contact the pharmacy document the reason why and proceed with request.) (Agent: If yes, when and what did the pharmacy advise?)  This is the patient's preferred pharmacy:   [facility] - [place], [place] - [address] [place] [address] Phone: [phone] Fax: [phone]  Is this the correct pharmacy for this prescription? Yes If no, delete pharmacy and type the correct one.   Has the prescription been filled recently? Yes  Is the patient out of the medication? Yes  Has the patient been seen for an appointment in the last year OR does the patient have an upcoming appointment? Yes  Can we respond through MyChart? Yes  Agent: Please be advised that Rx refills may take up to 3 business days. We ask that you follow-up with your pharmacy.

## 2024-06-05 ENCOUNTER — Ambulatory Visit (INDEPENDENT_AMBULATORY_CARE_PROVIDER_SITE_OTHER): Admitting: Gastroenterology

## 2024-06-05 ENCOUNTER — Encounter (INDEPENDENT_AMBULATORY_CARE_PROVIDER_SITE_OTHER): Payer: Self-pay | Admitting: Gastroenterology

## 2024-06-05 VITALS — BP 155/75 | HR 72 | Temp 97.5°F | Ht 72.0 in | Wt 207.2 lb

## 2024-06-05 DIAGNOSIS — G894 Chronic pain syndrome: Secondary | ICD-10-CM

## 2024-06-05 DIAGNOSIS — R109 Unspecified abdominal pain: Secondary | ICD-10-CM | POA: Diagnosis not present

## 2024-06-05 DIAGNOSIS — G8929 Other chronic pain: Secondary | ICD-10-CM | POA: Diagnosis not present

## 2024-06-05 MED ORDER — CILIDINIUM-CHLORDIAZEPOXIDE 2.5-5 MG PO CAPS: 1.0000 | ORAL_CAPSULE | Freq: Three times a day (TID) | ORAL | 3 refills | Status: AC | PRN
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
## 2024-06-05 NOTE — Patient Instructions (Addendum)
 Continue follow-up with pain management For now, we will try librax, you can take this three times per day before your meals Please avoid NSAIDs (advil , aleve, naproxen, goody powder, ibuprofen ) as these can be very hard on your GI tract, causing inflammation, ulcers and damage to the lining of your GI tract.  Will schedule CT angio abdomen and pelvis with contrast.

## 2024-06-06 ENCOUNTER — Telehealth (INDEPENDENT_AMBULATORY_CARE_PROVIDER_SITE_OTHER): Payer: Self-pay

## 2024-06-06 NOTE — Telephone Encounter (Signed)
 PA on [facility] for CT Angio: Pending - Tracking [id number]

## 2024-06-14 ENCOUNTER — Encounter (INDEPENDENT_AMBULATORY_CARE_PROVIDER_SITE_OTHER): Payer: Self-pay | Admitting: Gastroenterology

## 2024-06-14 NOTE — Telephone Encounter (Signed)
 Spoke with patient, scheduled CT for 07/18/2024 at 8:00am. Patient aware and verbalized understanding.

## 2024-06-14 NOTE — Telephone Encounter (Signed)
 Received fax via onbase PA approved Authorization #[id number] DOS: 06/06/2024 - 08/05/2024

## 2024-06-15 ENCOUNTER — Telehealth (INDEPENDENT_AMBULATORY_CARE_PROVIDER_SITE_OTHER): Payer: Self-pay

## 2024-06-15 NOTE — Telephone Encounter (Signed)
 Unfortunately there is no other treatment alternative to this medication.

## 2024-06-15 NOTE — Telephone Encounter (Signed)
 No, this medication is not adequate (Loperamide) for his symptoms. If patient wants to try the medicine, may want to pay it out of pocket. Thanks

## 2024-06-15 NOTE — Telephone Encounter (Signed)
 Tried do PA on this medication through [employer]:  Brian [name] (Key: [id number]) PA Case ID #: [id number] Need Help? Call us  at [phone] Outcome Additional Information Required There is an existing case within the [facility] environment that has the same patient, prescriber, and drug. This case must be finalized before proceeding with similar requests. Drug chlordiazePOXIDE -Clidinium 5-2.5MG  capsules ePA cloud [employer] Electronic PA Form  We will await the denial to see what the out come is,but per [facility] they got a denial on this claim from insurance.

## 2024-06-15 NOTE — Telephone Encounter (Signed)
 I spoke with Cierra at [facility] and made her aware the Loperamide was not an alternative to the Librax. I asked what the cost of this medication out of pocket would be and she says the cost for a sixty day supply would be $7,155.   Please advise.

## 2024-06-15 NOTE — Telephone Encounter (Signed)
 [facility] called this am to report the Librax 5-2.5 was not covered by the patient's insurance plan. They wanted to know if it would be appropriate to change the patient to loperamide 2 mg capsules? Please advise.    Reference # [id number] Phone # [phone] Fax # [phone]

## 2024-06-16 NOTE — Telephone Encounter (Signed)
 I spoke with the patient and made him aware the insurance denied the coverage of the Librax and the cash price would be over $7,000.00, for #60, which patient says he can not afford.

## 2024-06-23 ENCOUNTER — Ambulatory Visit

## 2024-06-23 ENCOUNTER — Ambulatory Visit: Payer: Self-pay

## 2024-06-23 ENCOUNTER — Institutional Professional Consult (permissible substitution): Admitting: Psychology

## 2024-06-27 ENCOUNTER — Telehealth: Payer: Self-pay | Admitting: Neurology

## 2024-06-27 NOTE — Telephone Encounter (Signed)
 Pt cancelled merz appts due to him not wanting to do testing again. He wanted to move aquino's appt up sooner. I told him I would send a mess but then he got agitated and said don't worry about it. He disconnected call.

## 2024-06-30 ENCOUNTER — Encounter: Admitting: Psychology

## 2024-06-30 ENCOUNTER — Institutional Professional Consult (permissible substitution): Admitting: Psychology

## 2024-06-30 ENCOUNTER — Institutional Professional Consult (permissible substitution): Payer: Self-pay | Admitting: Psychology

## 2024-06-30 ENCOUNTER — Ambulatory Visit

## 2024-07-18 ENCOUNTER — Ambulatory Visit (HOSPITAL_COMMUNITY)
Admission: RE | Admit: 2024-07-18 | Discharge: 2024-07-18 | Disposition: A | Discharge status: Home / Self Care | Source: Ambulatory Visit | Attending: Gastroenterology | Admitting: Gastroenterology

## 2024-07-18 DIAGNOSIS — K5730 Diverticulosis of large intestine without perforation or abscess without bleeding: Secondary | ICD-10-CM | POA: Diagnosis not present

## 2024-07-18 DIAGNOSIS — R109 Unspecified abdominal pain: Secondary | ICD-10-CM | POA: Insufficient documentation

## 2024-07-18 DIAGNOSIS — N281 Cyst of kidney, acquired: Secondary | ICD-10-CM | POA: Diagnosis not present

## 2024-07-18 DIAGNOSIS — G8929 Other chronic pain: Secondary | ICD-10-CM | POA: Diagnosis not present

## 2024-07-18 MED ORDER — IOHEXOL 350 MG/ML SOLN
100.0000 mL | Freq: Once | INTRAVENOUS | Status: AC | PRN
  Administered 2024-07-18: 100 mL via INTRAVENOUS

## 2024-07-19 LAB — POCT I-STAT CREATININE: Creatinine, Ser: 1.6 mg/dL — ABNORMAL HIGH (ref 0.61–1.24)

## 2024-07-21 DIAGNOSIS — H16142 Punctate keratitis, left eye: Secondary | ICD-10-CM | POA: Diagnosis not present

## 2024-07-23 ENCOUNTER — Ambulatory Visit (INDEPENDENT_AMBULATORY_CARE_PROVIDER_SITE_OTHER): Payer: Self-pay | Admitting: Gastroenterology

## 2024-08-14 ENCOUNTER — Ambulatory Visit: Admitting: Neurology

## 2024-08-14 ENCOUNTER — Encounter: Payer: Self-pay | Admitting: Neurology

## 2024-08-14 VITALS — BP 139/73 | HR 88 | Ht 72.0 in | Wt 207.6 lb

## 2024-08-14 DIAGNOSIS — R569 Unspecified convulsions: Secondary | ICD-10-CM | POA: Diagnosis not present

## 2024-08-14 DIAGNOSIS — F0670 Mild neurocognitive disorder due to known physiological condition without behavioral disturbance: Secondary | ICD-10-CM

## 2024-08-14 DIAGNOSIS — G43009 Migraine without aura, not intractable, without status migrainosus: Secondary | ICD-10-CM | POA: Diagnosis not present

## 2024-08-14 MED ORDER — NORTRIPTYLINE HCL 25 MG PO CAPS: ORAL_CAPSULE | ORAL | 3 refills | Status: AC

## 2024-08-14 MED ORDER — TOPIRAMATE 100 MG PO TABS: ORAL_TABLET | ORAL | 3 refills | Status: AC
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
## 2024-08-23 ENCOUNTER — Ambulatory Visit: Payer: Self-pay

## 2024-09-12 ENCOUNTER — Ambulatory Visit

## 2024-09-12 VITALS — BP 100/59 | HR 76 | Ht 72.0 in | Wt 204.0 lb

## 2024-09-12 DIAGNOSIS — Z23 Encounter for immunization: Secondary | ICD-10-CM | POA: Diagnosis not present

## 2024-09-12 DIAGNOSIS — Z720 Tobacco use: Secondary | ICD-10-CM | POA: Diagnosis not present
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
## 2024-09-14 NOTE — Assessment & Plan Note (Signed)
 Due for lung cancer screening. Previous scheduling attempt unsuccessful. - Reordered lung cancer screening. - Will schedule lung cancer screening appointment.

## 2025-02-13 ENCOUNTER — Ambulatory Visit: Payer: Self-pay

## 2025-02-27 ENCOUNTER — Ambulatory Visit: Payer: Self-pay | Admitting: Neurology
